# Patient Record
Sex: Male | Born: 1935
Health system: Southern US, Community
[De-identification: ages and names within clinical notes are randomized; demographics above are authoritative.]

## PROBLEM LIST (undated history)

## (undated) DIAGNOSIS — I10 Essential (primary) hypertension: Secondary | ICD-10-CM

## (undated) DIAGNOSIS — N183 Chronic kidney disease, stage 3 unspecified: Secondary | ICD-10-CM

## (undated) DIAGNOSIS — Z950 Presence of cardiac pacemaker: Secondary | ICD-10-CM

## (undated) DIAGNOSIS — R001 Bradycardia, unspecified: Secondary | ICD-10-CM

## (undated) DIAGNOSIS — E785 Hyperlipidemia, unspecified: Secondary | ICD-10-CM

## (undated) DIAGNOSIS — I440 Atrioventricular block, first degree: Secondary | ICD-10-CM

## (undated) DIAGNOSIS — I442 Atrioventricular block, complete: Secondary | ICD-10-CM

## (undated) DIAGNOSIS — N181 Chronic kidney disease, stage 1: Secondary | ICD-10-CM

## (undated) DIAGNOSIS — I4891 Unspecified atrial fibrillation: Secondary | ICD-10-CM

## (undated) DIAGNOSIS — I441 Atrioventricular block, second degree: Secondary | ICD-10-CM

## (undated) DIAGNOSIS — I429 Cardiomyopathy, unspecified: Secondary | ICD-10-CM

## (undated) HISTORY — DX: Chronic kidney disease, stage 1: N18.1

## (undated) HISTORY — DX: Bradycardia, unspecified: R00.1

## (undated) HISTORY — DX: Atrioventricular block, second degree: I44.1

## (undated) HISTORY — PX: COLONOSCOPY: SHX174

## (undated) HISTORY — DX: Essential (primary) hypertension: I10

## (undated) HISTORY — PX: OTHER SURGICAL HISTORY: SHX169

## (undated) HISTORY — DX: Hyperlipidemia, unspecified: E78.5

## (undated) HISTORY — DX: Atrioventricular block, first degree: I44.0

## (undated) HISTORY — PX: HEMORRHOID SURGERY: SHX153

---

## 2002-09-17 ENCOUNTER — Emergency Department (HOSPITAL_COMMUNITY): Admission: EM | Admit: 2002-09-17 | Discharge: 2002-09-17 | Payer: Self-pay | Admitting: Emergency Medicine

## 2002-12-16 ENCOUNTER — Inpatient Hospital Stay (HOSPITAL_COMMUNITY): Admission: EM | Admit: 2002-12-16 | Discharge: 2002-12-19 | Payer: Self-pay | Admitting: Internal Medicine

## 2006-04-25 ENCOUNTER — Emergency Department (HOSPITAL_COMMUNITY): Admission: EM | Admit: 2006-04-25 | Discharge: 2006-04-25 | Payer: Self-pay | Admitting: Emergency Medicine

## 2008-04-11 HISTORY — PX: UMBILICAL HERNIA REPAIR: SHX196

## 2008-04-22 ENCOUNTER — Ambulatory Visit (HOSPITAL_COMMUNITY): Admission: RE | Admit: 2008-04-22 | Discharge: 2008-04-22 | Payer: Self-pay | Admitting: General Surgery

## 2008-07-10 ENCOUNTER — Ambulatory Visit: Payer: Self-pay | Admitting: Cardiology

## 2008-07-12 HISTORY — PX: PACEMAKER INSERTION: SHX728

## 2008-07-12 HISTORY — PX: COLONOSCOPY: SHX174

## 2009-01-07 ENCOUNTER — Encounter: Payer: Self-pay | Admitting: Gastroenterology

## 2009-01-19 ENCOUNTER — Ambulatory Visit: Payer: Self-pay | Admitting: Cardiology

## 2009-01-19 ENCOUNTER — Inpatient Hospital Stay (HOSPITAL_COMMUNITY): Admission: EM | Admit: 2009-01-19 | Discharge: 2009-01-21 | Payer: Self-pay | Admitting: Emergency Medicine

## 2009-01-19 ENCOUNTER — Encounter (INDEPENDENT_AMBULATORY_CARE_PROVIDER_SITE_OTHER): Payer: Self-pay | Admitting: *Deleted

## 2009-01-20 ENCOUNTER — Encounter (INDEPENDENT_AMBULATORY_CARE_PROVIDER_SITE_OTHER): Payer: Self-pay | Admitting: Internal Medicine

## 2009-01-23 ENCOUNTER — Encounter: Payer: Self-pay | Admitting: Gastroenterology

## 2009-01-23 ENCOUNTER — Ambulatory Visit: Payer: Self-pay | Admitting: Internal Medicine

## 2009-01-23 ENCOUNTER — Observation Stay (HOSPITAL_COMMUNITY): Admission: RE | Admit: 2009-01-23 | Discharge: 2009-01-24 | Payer: Self-pay | Admitting: Internal Medicine

## 2009-01-24 ENCOUNTER — Encounter: Payer: Self-pay | Admitting: Internal Medicine

## 2009-02-03 ENCOUNTER — Ambulatory Visit: Payer: Self-pay | Admitting: Gastroenterology

## 2009-02-03 ENCOUNTER — Ambulatory Visit (HOSPITAL_COMMUNITY): Admission: RE | Admit: 2009-02-03 | Discharge: 2009-02-03 | Payer: Self-pay | Admitting: Gastroenterology

## 2009-02-03 ENCOUNTER — Encounter: Payer: Self-pay | Admitting: Gastroenterology

## 2009-02-06 ENCOUNTER — Ambulatory Visit: Payer: Self-pay

## 2009-02-06 ENCOUNTER — Encounter: Payer: Self-pay | Admitting: Internal Medicine

## 2009-02-06 ENCOUNTER — Encounter (INDEPENDENT_AMBULATORY_CARE_PROVIDER_SITE_OTHER): Payer: Self-pay

## 2009-03-10 DIAGNOSIS — E1129 Type 2 diabetes mellitus with other diabetic kidney complication: Secondary | ICD-10-CM | POA: Insufficient documentation

## 2009-03-10 DIAGNOSIS — I10 Essential (primary) hypertension: Secondary | ICD-10-CM | POA: Insufficient documentation

## 2009-03-10 DIAGNOSIS — E785 Hyperlipidemia, unspecified: Secondary | ICD-10-CM | POA: Insufficient documentation

## 2009-05-07 ENCOUNTER — Ambulatory Visit: Payer: Self-pay | Admitting: Internal Medicine

## 2009-06-23 ENCOUNTER — Encounter (INDEPENDENT_AMBULATORY_CARE_PROVIDER_SITE_OTHER): Payer: Self-pay | Admitting: *Deleted

## 2009-06-26 ENCOUNTER — Encounter (INDEPENDENT_AMBULATORY_CARE_PROVIDER_SITE_OTHER): Payer: Self-pay | Admitting: *Deleted

## 2009-06-26 ENCOUNTER — Encounter: Payer: Self-pay | Admitting: Cardiology

## 2009-06-26 LAB — CONVERTED CEMR LAB: TSH: 1.6 microintl units/mL

## 2009-07-03 ENCOUNTER — Encounter (INDEPENDENT_AMBULATORY_CARE_PROVIDER_SITE_OTHER): Payer: Self-pay | Admitting: *Deleted

## 2009-07-29 ENCOUNTER — Encounter (INDEPENDENT_AMBULATORY_CARE_PROVIDER_SITE_OTHER): Payer: Self-pay | Admitting: *Deleted

## 2009-08-01 ENCOUNTER — Ambulatory Visit: Payer: Self-pay | Admitting: Cardiology

## 2009-08-01 DIAGNOSIS — I495 Sick sinus syndrome: Secondary | ICD-10-CM | POA: Insufficient documentation

## 2009-08-01 DIAGNOSIS — Z950 Presence of cardiac pacemaker: Secondary | ICD-10-CM | POA: Insufficient documentation

## 2009-08-04 ENCOUNTER — Encounter: Payer: Self-pay | Admitting: Cardiology

## 2009-08-04 LAB — CONVERTED CEMR LAB
Albumin: 4.3 g/dL (ref 3.5–5.2)
Alkaline Phosphatase: 70 units/L (ref 39–117)
BUN: 25 mg/dL — ABNORMAL HIGH (ref 6–23)
Basophils Absolute: 0 10*3/uL (ref 0.0–0.1)
Calcium: 9.3 mg/dL (ref 8.4–10.5)
Chloride: 107 meq/L (ref 96–112)
Eosinophils Absolute: 0.2 10*3/uL (ref 0.0–0.7)
Eosinophils Relative: 5 % (ref 0–5)
Glucose, Bld: 51 mg/dL — ABNORMAL LOW (ref 70–99)
HCT: 37.5 % — ABNORMAL LOW (ref 39.0–52.0)
HDL: 48 mg/dL (ref >39)
Hemoglobin: 12.1 g/dL — ABNORMAL LOW (ref 13.0–17.0)
Lymphocytes Relative: 37 % (ref 12–46)
MCV: 81.5 fL (ref 78.0–100.0)
Monocytes Absolute: 0.3 10*3/uL (ref 0.1–1.0)
Potassium: 4.7 meq/L (ref 3.5–5.3)
RDW: 15 % (ref 11.5–15.5)
Triglycerides: 167 mg/dL — ABNORMAL HIGH (ref <150)

## 2010-02-11 ENCOUNTER — Ambulatory Visit: Payer: Self-pay | Admitting: Internal Medicine

## 2010-08-11 NOTE — Assessment & Plan Note (Signed)
Summary: 6 mth f/u per checkout on 07/10/08/tg   Visit Type:  Follow-up Primary Provider:  Dr. Legrand Rams   History of Present Illness: Return visit for this very pleasant gentleman with sinus node dysfunction, who unbeknownst to me presented with 2:1 AV block prompting implantation of a dual-chamber pacemaker.  Although his hospital record indicates that he presented with fatigue and dyspnea, but he does not recall ever been symptomatic.  He has remained fine since the pacemaker was implanted.  Current Medications (verified): 1)  Glipizide 5 Mg Tabs (Glipizide) .... One By Mouth Two Times A Day 2)  Aspir-Low 81 Mg Tbec (Aspirin) .... One By Mouth Daily 3)  Losartan Potassium 100 Mg Tabs (Losartan Potassium) .... One By Mouth Daily 4)  Niaspan 500 Mg Cr-Tabs (Niacin (Antihyperlipidemic)) .... One By Mouth Daily 5)  Amlodipine Besylate 5 Mg Tabs (Amlodipine Besylate) .... Take 1 Tab Daily  Allergies (verified): No Known Drug Allergies  Past History:  PMH, FH, and Social History reviewed and updated.  Vital Signs:  Patient profile:   77 year old male Height:      72 inches Weight:      239 pounds BMI:     32.53 Pulse rate:   83 / minute BP sitting:   124 / 71  (right arm)  Vitals Entered By: Doretha Sou, CNA (August 01, 2009 11:17 AM)  Physical Exam  General:  GENERAL:  Very pleasant gentleman in no acute distress. HEENT: Bilateral arcus NECK:  No jugular venous distention; normal carotid upstrokes withoutbruits. LUNGS:  Clear. CARDIAC:  Normal first and second heart sounds; fourth heart sound present; early systolic ejection murmur. ABDOMEN:  Soft and nontender; no bruits; no masses; no organomegaly. EXTREMITIES:  1+ edema; distal pulses-2+ dorsalis pedis on the left; both posterior tibials and the right dorsalis pedis are 0-1+. NEUROLOGIC:  Symmetric strength and tone; normal cranial nerves.  Skin: Multiple skin tags    PPM Specifications Following MD:  Cristopher Peru,  MD     PPM Vendor:  [employer]     PPM Model Number:  [id number]     PPM Serial Number:  [id number] DOI:  01/23/2009     PPM Implanting MD:  Cristopher Peru, MD  Lead 1    Location: [facility]     DOI: 01/23/2009     Model #: [id number]     Serial #: [id number]     Status: active Lead 2    Location: [facility]     DOI: 01/23/2009     Model #: [id number]     Serial #: [id number]     Status: active  Magnet Response Rate:  BOL 100 ERI 85    PPM Follow Up Pacer Dependent:  Yes      Episodes Coumadin:  No  Parameters Mode:  DDD     Lower Rate Limit:  60     Upper Rate Limit:  110 Paced AV Delay:  180     Sensed AV Delay:  150  Impression & Recommendations:  Problem # 1:  SINOATRIAL NODE DYSFUNCTION (ICD-427.81) He is doing fine since his pacemaker was implanted, and is likely to continue to do so for the next 15 years.  He will be followed by Dr. Lovena Le for this problem and is scheduled to return to see him in July.  Problem # 2:  HYPERTENSION (ICD-401.9) Blood pressure control is good with current medications.  Problem # 3:  DIABETES MELLITUS, TYPE II, CONTROLLED (ICD-250.00) Mr. Boyte he  reports CBGs of 100--150.  Although he is on a very low dose of metformin, his creatinine has been above the range where this drug is recommended.  Accordingly, I have asked him to discontinue that medication and discuss further treatment at his next office visit with you.  Certainly would be possible to increase his dose of glipizide if that is required.  Problem # 4:  CHRONIC KIDNEY DISEASE-STAGE III (ICD-585.1)  Repeat chemistry profile has been ordered.  Problem # 5:  HYPERLIPIDEMIA, CONTROLLED (ICD-272.4) Lipid profile has been requested.  With diabetes, LDL should certainly be maintained less than 100 and is close to 70 as is reasonably achievable.  I appreciate Dr. Legrand Rams initial referral of this nice gentleman.  He did not need the services of 2 cardiologist and will be asked followed exclusively by Dr. Lovena Le in the  future.  Other Orders: T-Comprehensive Metabolic Panel ([id number]) T-CBC w/Diff [phone]) T-Lipid Profile [phone])  Patient Instructions: 1)  Your physician recommends that you schedule a follow-up appointment in: prn 2)  Your physician recommends that you return for lab work in: today 3)  Your physician has recommended you make the following change in your medication: stop metformin

## 2010-08-11 NOTE — Procedures (Signed)
Summary: 7/11 f/u per checkout on 05/07/09/tg   Visit Type:  Follow-up Primary Provider:  Dr. Legrand Rams  CC:  no cardiology complaints.  History of Present Illness: Kyle Schultz returns today for followup.  He is a pleasant 77 yo man with a h/o 2:1 heart block and is s/p PPM.  He has HTN, DM, and dyslipidemia.  He has not had any c/p or sob or peripheral edema.  No fever or chills.  Current Medications (verified): 1)  Glipizide 5 Mg Tabs (Glipizide) .... One By Mouth Two Times A Day 2)  Aspir-Low 81 Mg Tbec (Aspirin) .... One By Mouth Daily 3)  Losartan Potassium 100 Mg Tabs (Losartan Potassium) .... One By Mouth Daily 4)  Niaspan 500 Mg Cr-Tabs (Niacin (Antihyperlipidemic)) .... One By Mouth Daily 5)  Amlodipine Besylate 5 Mg Tabs (Amlodipine Besylate) .... Take 1 Tab Daily 6)  Simvastatin 20 Mg Tabs (Simvastatin) .... Take 1 Tablet By Mouth Once Daily  Allergies (verified): No Known Drug Allergies  Past History:  Past Medical History: Last updated: 08/01/2009 Conduction system disease: Sinus bradycardia; first-degree AV block; second degree AV block requiring       implantation of a dual-chamber pacemaker in 01/2009 ([employer]) HYPERLIPIDEMIA HYPERTENSION (ICD-401.9) DIABETES MELLITUS, TYPE II, -A1c of 6.4 in 6/09 CHRONIC KIDNEY DISEASE STAGE I (ICD-585.1)-creatinine of 1.7 in 04/1999  Review of Systems  The patient denies chest pain, syncope, dyspnea on exertion, and peripheral edema.    Vital Signs:  Patient profile:   77 year old male Weight:      236 pounds BMI:     32.12 Pulse rate:   93 / minute BP sitting:   131 / 77  (right arm)  Vitals Entered By: Doretha Sou, CNA (February 11, 2010 8:21 AM)  Physical Exam  General:  GENERAL:  Very pleasant gentleman in no acute distress. HEENT: Bilateral arcus NECK:  No jugular venous distention; normal carotid upstrokes withoutbruits. LUNGS:  Clear. Well healed PPM incision. CARDIAC:  Normal first and second heart sounds;  fourth heart sound present; early systolic ejection murmur. ABDOMEN:  Soft and nontender; no bruits; no masses; no organomegaly. EXTREMITIES:  1+ edema; distal pulses-2+ dorsalis pedis on the left; both posterior tibials and the right dorsalis pedis are 0-1+. NEUROLOGIC:  Symmetric strength and tone; normal cranial nerves.  Skin: Multiple skin tags    PPM Specifications Following MD:  Cristopher Peru, MD     PPM Vendor:  [employer]     PPM Model Number:  [id number]     PPM Serial Number:  [id number] PPM DOI:  01/23/2009     PPM Implanting MD:  Cristopher Peru, MD  Lead 1    Location: [facility]     DOI: 01/23/2009     Model #: [id number]     Serial #: [id number]     Status: active Lead 2    Location: RV     DOI: 01/23/2009     Model #: [id number]     Serial #: [id number]     Status: active  Magnet Response Rate:  BOL 100 ERI 85    PPM Follow Up Remote Check?  No Battery Voltage:  2.98 V     Battery Est. Longevity:  10.7 years     Pacer Dependent:  Yes       PPM Device Measurements Atrium  Amplitude: 1.9 mV, Impedance: 360 ohms, Threshold: 0.5 V at 0.4 msec Right Ventricle  Amplitude: 9.7 mV, Impedance: 490 ohms, Threshold: 0.5  V at 0.4 msec  Episodes MS Episodes:  227     Percent Mode Switch:  <1%     Coumadin:  No Atrial Pacing:  7.6%     Ventricular Pacing:  98%  Parameters Mode:  DDD     Lower Rate Limit:  60     Upper Rate Limit:  110 Paced AV Delay:  180     Sensed AV Delay:  150 Next Cardiology Appt Due:  08/12/2010 Tech Comments:  No parameter changes.  A-fib with controlled ventricular rates.  The longest mode switch was 19:30 minutes, - coumadin.  ROV 6 months [facility] clinic.  No Merlin @ this time. [name], LPN  August  3, [id number] 8:36 AM  MD Comments:  Agree with above.  Impression & Recommendations:  Problem # 1:  CARDIAC PACEMAKER IN SITU (ICD-V45.01) His PPM is working normally.  Will recheck in several months.  Problem # 2:  HYPERLIPIDEMIA, CONTROLLED (ICD-272.4) I have asked him to decrease  his Simvastatin to 20 mg daily as he is on amlodipine for BP control. His updated medication list for this problem includes:    Niaspan 500 Mg Cr-tabs (Niacin (antihyperlipidemic)) ..... One by mouth daily    Simvastatin 20 Mg Tabs (Simvastatin) .Marland Kitchen... Take 1 tablet by mouth once daily  Problem # 3:  HYPERTENSION (ICD-401.9) His blood pressure remains controlled on meds as below.  A low sodium diet is recommended. His updated medication list for this problem includes:    Aspir-low 81 Mg Tbec (Aspirin) ..... One by mouth daily    Losartan Potassium 100 Mg Tabs (Losartan potassium) ..... One by mouth daily    Amlodipine Besylate 5 Mg Tabs (Amlodipine besylate) .Marland Kitchen... Take 1 tab daily  Patient Instructions: 1)  Your physician recommends that you schedule a follow-up appointment in: 6 months for device check and in 1 year with Dr. Lovena Le. 2)  Your physician has recommended you make the following change in your medication: decrease Simvastatin to 20mg  by mouth at bedtime (may take 1/2 tablet of 40mg  tablets until you finish bottle). Prescriptions: SIMVASTATIN 20 MG TABS (SIMVASTATIN) take 1 tablet by mouth once daily  #30 x 6   Entered by:   Jeani Hawking Via LPN   Authorized by:   Sophronia Simas, MD, [facility]   Signed by:   Jeani Hawking Via LPN on [id number]   Method used:   Electronically to        [facility] (retail)       [place] [address]       [place], [place]  [address]       Ph: [phone]       Fax: [phone]   RxID:   [id number]

## 2010-08-11 NOTE — Miscellaneous (Signed)
Summary: simvastatin  Clinical Lists Changes  Medications: Added new medication of SIMVASTATIN 40 MG TABS (SIMVASTATIN) Take one tablet by mouth daily at bedtime - Signed Rx of SIMVASTATIN 40 MG TABS (SIMVASTATIN) Take one tablet by mouth daily at bedtime;  #30 x 6;  Signed;  Entered by: Tye Savoy RN;  Authorized by: Yehuda Savannah, MD, [facility];  Method used: Electronically to [facility] [place], [place], [place], [place]  [address], Ph: [phone], Fax: [phone]    Prescriptions: SIMVASTATIN 40 MG TABS (SIMVASTATIN) Take one tablet by mouth daily at bedtime  #30 x 6   Entered by:   Tye Savoy RN   Authorized by:   Yehuda Savannah, MD, [facility]   Signed by:   Tye Savoy RN on 08/04/2009   Method used:   Electronically to        [facility] (retail)       [place] [address]       [place], [place]  [address]       Ph: [phone]       Fax: [phone]   RxID:   [id number]

## 2010-08-11 NOTE — Miscellaneous (Signed)
Summary: LABS TSH,06/26/2009  Clinical Lists Changes  Observations: Added new observation of TSH: 1.600 microintl units/mL (06/26/2009 10:52)

## 2010-08-14 ENCOUNTER — Encounter: Payer: Self-pay | Admitting: Internal Medicine

## 2010-08-14 ENCOUNTER — Encounter (INDEPENDENT_AMBULATORY_CARE_PROVIDER_SITE_OTHER): Payer: Medicare Other

## 2010-08-14 ENCOUNTER — Ambulatory Visit: Admit: 2010-08-14 | Payer: Self-pay | Admitting: Internal Medicine

## 2010-08-14 DIAGNOSIS — I498 Other specified cardiac arrhythmias: Secondary | ICD-10-CM

## 2010-08-27 NOTE — Procedures (Signed)
Summary: Cardiology Device Clinic   Current Medications (verified): 1)  Glipizide 5 Mg Tabs (Glipizide) .... One By Mouth Two Times A Day 2)  Aspir-Low 81 Mg Tbec (Aspirin) .... One By Mouth Daily 3)  Losartan Potassium 100 Mg Tabs (Losartan Potassium) .... One By Mouth Daily 4)  Niaspan 500 Mg Cr-Tabs (Niacin (Antihyperlipidemic)) .... One By Mouth Daily 5)  Simvastatin 20 Mg Tabs (Simvastatin) .... Take 1 Tablet By Mouth Once Daily  Allergies (verified): No Known Drug Allergies  PPM Specifications Following MD:  Cristopher Peru, MD     PPM Vendor:  [employer]     PPM Model Number:  [id number]     PPM Serial Number:  [id number] PPM DOI:  01/23/2009     PPM Implanting MD:  Cristopher Peru, MD  Lead 1    Location: [facility]     DOI: 01/23/2009     Model #: [id number]     Serial #: [id number]     Status: active Lead 2    Location: [facility]     DOI: 01/23/2009     Model #: [id number]     Serial #: [id number]     Status: active  Magnet Response Rate:  BOL 100 ERI 85  Indications:  2:1 Heart block   PPM Follow Up Remote Check?  No Battery Voltage:  2.98 V     Battery Est. Longevity:  10.4 years     Pacer Dependent:  Yes       PPM Device Measurements Atrium  Amplitude: 2.5 mV, Impedance: 380 ohms, Threshold: 0.375 V at 0.4 msec Right Ventricle  Amplitude: 12 mV, Impedance: 430 ohms, Threshold: 0.375 V at 0.4 msec  Episodes MS Episodes:  260     Percent Mode Switch:  <1%     Coumadin:  No Atrial Pacing:  6.2%     Ventricular Pacing:  99%  Parameters Mode:  DDD     Lower Rate Limit:  60     Upper Rate Limit:  110 Paced AV Delay:  180     Sensed AV Delay:  150 Next Cardiology Appt Due:  02/10/2011 Tech Comments:  No parameter changes.  Device function normal.  Longest mode switch 5:18 minutes, - coumadin, ventricular rates controlled.  ROV 6 months with Dr. Lovena Le in [facility]. [name], LPN  February  3, [id number] 10:58 AM

## 2010-08-27 NOTE — Cardiovascular Report (Signed)
Summary: Office Visit   Office Visit   Imported By: Sallee Provencal 08/20/2010 16:10:44  _____________________________________________________________________  External Attachment:    Type:   Image     Comment:   External Document

## 2010-10-14 ENCOUNTER — Other Ambulatory Visit: Payer: Self-pay | Admitting: Internal Medicine

## 2010-10-18 LAB — GLUCOSE, CAPILLARY
Glucose-Capillary: 81 mg/dL (ref 70–99)
Glucose-Capillary: 113 mg/dL — ABNORMAL HIGH (ref 70–99)
Glucose-Capillary: 113 mg/dL — ABNORMAL HIGH (ref 70–99)
Glucose-Capillary: 117 mg/dL — ABNORMAL HIGH (ref 70–99)
Glucose-Capillary: 62 mg/dL — ABNORMAL LOW (ref 70–99)
Glucose-Capillary: 64 mg/dL — ABNORMAL LOW (ref 70–99)
Glucose-Capillary: 93 mg/dL (ref 70–99)
Glucose-Capillary: 98 mg/dL (ref 70–99)

## 2010-10-18 LAB — COMPREHENSIVE METABOLIC PANEL
Alkaline Phosphatase: 47 U/L (ref 39–117)
BUN: 21 mg/dL (ref 6–23)
Chloride: 105 mEq/L (ref 96–112)
Glucose, Bld: 80 mg/dL (ref 70–99)
Potassium: 4.2 mEq/L (ref 3.5–5.1)
Total Bilirubin: 1.3 mg/dL — ABNORMAL HIGH (ref 0.3–1.2)

## 2010-10-18 LAB — PROTIME-INR
INR: 1 (ref 0.00–1.49)
Prothrombin Time: 13.7 seconds (ref 11.6–15.2)

## 2010-10-18 LAB — BASIC METABOLIC PANEL
Calcium: 9.3 mg/dL (ref 8.4–10.5)
Chloride: 105 mEq/L (ref 96–112)
Creatinine, Ser: 1.21 mg/dL (ref 0.4–1.5)
GFR calc Af Amer: >60 mL/min (ref >60)
Sodium: 138 mEq/L (ref 135–145)

## 2010-10-18 LAB — CARDIAC PANEL(CRET KIN+CKTOT+MB+TROPI)
CK, MB: 4 ng/mL (ref 0.3–4.0)
CK, MB: 4.3 ng/mL — ABNORMAL HIGH (ref 0.3–4.0)
CK, MB: 5.2 ng/mL — ABNORMAL HIGH (ref 0.3–4.0)
Relative Index: 1.1 (ref 0.0–2.5)
Relative Index: 1.3 (ref 0.0–2.5)
Total CK: 450 U/L — ABNORMAL HIGH (ref 7–232)
Troponin I: 0.04 ng/mL (ref 0.00–0.06)

## 2010-10-18 LAB — DIFFERENTIAL
Basophils Absolute: 0 10*3/uL (ref 0.0–0.1)
Basophils Relative: 1 % (ref 0–1)
Eosinophils Absolute: 0.2 10*3/uL (ref 0.0–0.7)
Lymphs Abs: 1.2 10*3/uL (ref 0.7–4.0)
Monocytes Absolute: 0.3 10*3/uL (ref 0.1–1.0)
Monocytes Relative: 6 % (ref 3–12)
Neutro Abs: 1.8 10*3/uL (ref 1.7–7.7)
Neutro Abs: 2.1 10*3/uL (ref 1.7–7.7)
Neutrophils Relative %: 47 % (ref 43–77)
Neutrophils Relative %: 52 % (ref 43–77)

## 2010-10-18 LAB — CBC
HCT: 36.7 % — ABNORMAL LOW (ref 39.0–52.0)
Hemoglobin: 12.4 g/dL — ABNORMAL LOW (ref 13.0–17.0)
MCV: 81.7 fL (ref 78.0–100.0)
Platelets: 163 10*3/uL (ref 150–400)
RBC: 4.36 MIL/uL (ref 4.22–5.81)
WBC: 3.4 10*3/uL — ABNORMAL LOW (ref 4.0–10.5)
WBC: 4.6 10*3/uL (ref 4.0–10.5)

## 2010-10-18 LAB — LIPID PANEL
Cholesterol: 177 mg/dL (ref 0–200)
LDL Cholesterol: 121 mg/dL — ABNORMAL HIGH (ref 0–99)
Total CHOL/HDL Ratio: 4.9 RATIO
Triglycerides: 100 mg/dL (ref <150)

## 2010-10-18 LAB — POCT CARDIAC MARKERS

## 2010-10-19 LAB — GLUCOSE, CAPILLARY
Glucose-Capillary: 105 mg/dL — ABNORMAL HIGH (ref 70–99)
Glucose-Capillary: 82 mg/dL (ref 70–99)

## 2010-11-24 NOTE — Letter (Signed)
July 10, 2008    Tesfaye D. Legrand Rams, [place]  [place], [place] [address]   RE:  RYKAN, VITTETOE  MRN:  [id number]  /  DOB:  03-May-1930   Dear Brandon Melnick,   Mr. Slover was evaluated in the office in consultation today at your  request for bradycardia.  As you know, this nice gentleman has enjoyed  generally excellent health.  He has never been told of any cardiac  problems nor has he been previously evaluated by a cardiologist.  He  does have hyperlipidemia that has been treated medically as well as  hypertension and mild diabetes.  He also has mild chronic kidney  disease, stage III, with a recent creatinine of 1.7.  Recent hemoglobin  A1c was 6.4.  He underwent an uncomplicated surgical repair of an  umbilical hernia in October 2009.  During a recent visit to your office,  an EKG showed sinus bradycardia with marked first-degree AV block,  prompting this assessment.   Mr. Filosa denies any cardiovascular symptoms.  Specifically, he has not  experienced lightheadedness nor syncope.  He has no weakness.  He has no  exertional intolerance, although his lifestyle is fairly sedentary.  He  notes no dyspnea, either at rest or with exercise.   PAST MEDICAL HISTORY:  Otherwise unremarkable.   ALLERGIES:  He has no known allergies.   CURRENT MEDICATIONS:  1. Aspirin 81 mg daily.  2. Niaspan 500 mg daily.  3. Hyzaar 100/25 mg daily.  4. Glipizide 5 mg b.i.d.  5. Metformin 250 mg b.i.d.   SOCIAL HISTORY:  Retired from work with [employer]; widower with  one adult child; no use of tobacco products nor alcohol.   FAMILY HISTORY:  Father had cardiac problems; 14 siblings, three died  due to neoplastic disease, one had ALS and three are still living.  The  cause of death of his other siblings is unknown.   REVIEW OF SYSTEMS:  Notable for need for corrective lenses for near  vision, some hearing impairment, upper and lower dentures, occasional  palpitations, a history of  heart murmur, stable weight and appetite,  urinary frequency, pedal edema and arthritic discomfort in his shoulders  and neck.  All other systems reviewed and are negative.   PHYSICAL EXAMINATION:  GENERAL:  Very pleasant gentleman in no acute  distress.  VITAL SIGNS:  The weight is 235, blood pressure 140/70, heart rate 50  and regular, respirations 14.  NECK:  No jugular venous distention; normal carotid upstrokes without  bruits.  HEENT:  Bilateral arcus; EOMs full; normal lids and conjunctivae; normal  oral mucosa.  LUNGS:  Clear.  CARDIAC:  Normal first and second heart sounds; fourth heart sound  present; early systolic ejection murmur.  ABDOMEN:  Soft and nontender; no bruits; no masses; no organomegaly.  EXTREMITIES:  1+ edema; normal distal pulses.  NEUROLOGIC:  Symmetric strength and tone; normal cranial nerves.   EKG:  Sinus bradycardia; profound first-degree AV block with PR interval  of 360 milliseconds; delayed R-wave progression - cannot exclude  previous anteroseptal myocardial infarction; left atrial abnormality.   Recent laboratory from your office includes an essentially normal CBC  with a minimally reduced hemoglobin and hematocrit, and a normal  chemistry profile except for creatinine of 1.67.  A prior  electrocardiogram was obtained from the hospital record of Mr. Amicucci  surgery.  There is no significant difference between his EKG of 2 months  ago and his current tracing.  IMPRESSION:  Mr. Bonkowski presents with asymptomatic sinus bradycardia and  first-degree atrioventricular block.  A TSH level will be obtained, but  this probably is caused by deterioration of his conduction system.  The  rapidity with which this will progress is uncertain.  The symptoms  of high degree heart block were described to him.  He will call if these  develop.  Otherwise, no current intervention is warranted.   Thanks so much for referring this nice gentleman to me.  I will plan  to  see him again in 6 months.    Sincerely,      Cristopher Estimable. Lattie Haw, MD, [facility]  Electronically Signed    RMR/MedQ  DD: 07/10/2008  DT: 07/11/2008  Job #: [id number]

## 2010-11-24 NOTE — Op Note (Signed)
Kyle Schultz, Kyle Schultz                 ACCOUNT NO.:  [id number]   MEDICAL RECORD NO.:  [id number]          PATIENT TYPE:  AMB   LOCATION:  DAY                           FACILITY:  [facility]   PHYSICIAN:  Chelsea Primus, MD      DATE OF BIRTH:  [birth year]   DATE OF PROCEDURE:  04/22/2008  DATE OF DISCHARGE:                               OPERATIVE REPORT   PREOPERATIVE DIAGNOSIS:  Incarcerated umbilical hernia.   POSTOPERATIVE DIAGNOSIS:  Incarcerated umbilical hernia.   PROCEDURE:  Anterior open umbilical herniorrhaphy with primary repair.   SURGEON:  Chelsea Primus, MD.   ANESTHESIA:  General endotracheal local anesthesia, 0.5% Sensorcaine  plain.   SPECIMEN:  None.   ESTIMATED BLOOD LOSS:  Scant.   INDICATIONS:  The patient is a 77 year old male who presented to my  office as an outpatient with a history of a bulge by his umbilicus.  This is not significantly increased in size, but his surgery caused  increased discomfort in this area.  I was clear that he an umbilical  hernia.  The risks, benefits, and alternatives of a repair were  discussed including but not limited to risk of bleeding, infection, and  recurrence.  Due to its suspected small size and location, I did discuss  both laparoscopic as well as an open approach.  However, again, due to  its size I did recommend that we would proceed with under an open  approach.  The patient's questions and concerns were addressed, and the  patient was consented for the planned procedure.   OPERATION:  The patient was taken to the operating room.  He was placed  in a supine position on the operating table, at which time a general  anesthetic was administered.  Once the patient was asleep, he was  endotracheally intubated by anesthesia.  At this point, his abdomen was  prepped with DuraPrep solution and draped in standard fashion.  An  infraumbilical skin incision was created with a scalpel.  Additional  dissection down through the  subcuticular tissue was carried out using  electrocautery.  This was taken down to the fascia and, then a hemostat  was utilized to bluntly dissect around the umbilical stalk.  At this  point, I utilized the electrocautery dissection to dissect the umbilical  stalk off the underlying hernia sac.  Once freed, the hernia sac was  freed from the fascia and was reduced bluntly back into the abdominal  cavity, noted there did not appear to be any bowel with any incarcerated  segment.  It all appeared to be omental fat, which was easily reduced  back into the abdominal cavity.  The remaining defect was approximately  1 cm in length and easily reapproximated using 0 Ethibond sutures in  simple interrupted fashion with these sutures in place.  The local  anesthetic was instilled.  A 3-0 Vicryl was then utilized to affix the  umbilical stalk to the fascia and a 3-0 Vicryl was utilized to  reapproximate the subcuticular tissue.  A 4-0 Monocryl was utilized to  reapproximate the skin edges in  a running subcuticular suture.  The skin  was washed and dried with a moist and dry towel.  Benzoin was applied  around the incision.  Half-inch Steri-Strips were placed.  The drapes  were  removed.  The patient was allowed to come out of general anesthetic and  was transferred back to regular hospital bed.  He was transferred to  [facility] in stable condition.  At the conclusion of  procedure all instrument, sponge, and needle counts were correct.  The  patient tolerated the procedure well.      Chelsea Primus, MD  Electronically Signed     BZ/MEDQ  D:  04/22/2008  T:  04/23/2008  Job:  [id number]   cc:   Shanna Cisco., MD  [place]  [place] [address]

## 2010-11-24 NOTE — Discharge Summary (Signed)
Kyle Schultz, Kyle Schultz                 ACCOUNT NO.:  [id number]   MEDICAL RECORD NO.:  [id number]          PATIENT TYPE:  INP   LOCATION:  2508                         FACILITY:  [facility]   PHYSICIAN:  Champ Mungo. Lovena Le, MD    DATE OF BIRTH:  [birth year]   DATE OF ADMISSION:  01/23/2009  DATE OF DISCHARGE:  01/24/2009                               DISCHARGE SUMMARY   This patient has no known drug allergies.   TIME FOR THIS DICTATION:  Greater than 40 minutes.   FINAL DIAGNOSES:  1. Discharging day 1, status post implant of a St. Jude ACCENT DR dual-      chamber pacemaker.  2. Symptomatic bradycardia.      a.     2:1 heart block.      b.     Fatigue and presyncope.  3. Echocardiogram this admission, ejection fraction 55-60%.   SECONDARY DIAGNOSES:  1. Diabetes.  2. Hypertension.  3. Dyslipidemia.   PROCEDURE:  January 23, 2009, implant of the [facility] Jude dual-chamber  pacemaker, Kyle Schultz for symptomatic bradycardia.  The device has  been interrogated postprocedure day #1, all values within normal limits.  Chest x-ray shows no pneumothorax, leads in appropriate position.  There  is no hematoma at the incision site.  The patient is asked to keep his  incision dry for the next 7 days to sponge bathe until Thursday, July  22.   BRIEF HISTORY:  Kyle Schultz is a 77 year old male.  He has multiple cardiac  risk factors.  They include hypertension, diabetes, and dyslipidemia.  He has been admitted to [facility] for bradycardia.   Kyle Schultz was first seen at [facility] at the [place] office 6  months ago, he was apparently asymptomatic with sinus bradycardia.  He  had first-degree AV block at that time.  He had no AV modulators on  board.  The patient was symptom free and no further evaluation was  warranted.  The patient was then readmitted to [facility] on  July 12.  He was found to be in 2:1 heart block with dyspnea,  diaphoresis, fatigue, and some shortness of  breath.  The patient is  hemodynamically stable, but he has been referred to [facility]  Electrophysiology for permanent pacemaker implantation.  He presents  electively to [facility] on July 15 for the  procedure.  Electrocardiogram shows 2:1 AV block.  The patient is  relatively unsymptomatic, but we really believe that he will do better  with pacing and that he will find that he is much more able to carry on  his activities of daily life and he should not have recurrent episodes  of dizziness as he states he does sometimes have.   HOSPITAL COURSE:  Elective admission to [facility] on July 15,  he underwent implantation of the dual-chamber St. Jude device the same  day.  His postoperative course has been uncomplicated.  At the time of  discharge, his heart rate is in the 80s.  His admission heart rate was  in the low 40s.  He is A sensing and V pacing.  His device has been  interrogated.  All values within normal limits.   MEDICATIONS AT DISCHARGE:  1. Glipizide 5 mg twice daily.  2. Metformin 500 mg tablets 1/2 tablet twice daily.  3. Enteric-coated aspirin 81 mg daily.  4. Losartan 100 mg daily.  5. Niaspan 500 mg daily at bedtime.  He takes aspirin 1/2 hour prior      to the Niaspan dose.   He follows up at [facility], which is at [facility], on Thursday, July 29 at 10 o'clock and he sees Kyle Schultz in the  [place] office on Wednesday, October 27 at 2:30.  He is asked to keep  his incision dry for the next 7 days and mobility of the left arm has  been discussed with the patient.   LABORATORY VALUES:  Pertinent to this admission were drawn on July 12,  troponin I studies are negative x3.  Protime 13.7.  INR is 1.  Sodium  138, potassium 4.2, chloride 105, carbonate 29, BUN is 16, creatinine  1.21, and glucose 180.  White cells 3.4, hemoglobin 12.3, hematocrit  35.6, and platelets 163.  TSH 1.047.  HGB A1c 6.0.       Kyle Margarita, PA      [place] Lovena Le, MD  Electronically Signed    GM/MEDQ  D:  01/24/2009  T:  01/24/2009  Job:  [id number]   cc:   Tesfaye D. Legrand Rams, MD  Cristopher Estimable. Lattie Haw, MD, [facility]

## 2010-11-24 NOTE — Consult Note (Signed)
NAMELEGRAND, WANNAMAKER                 ACCOUNT NO.:  [id number]   MEDICAL RECORD NO.:  [id number]          PATIENT TYPE:  INP   LOCATION:  IC03                          FACILITY:  [facility]   PHYSICIAN:  Cristopher Estimable. Lattie Haw, MD, FACCDATE OF BIRTH:  30-Mar-1930   DATE OF CONSULTATION:  01/20/2009  DATE OF DISCHARGE:                                 CONSULTATION   PRIMARY CARDIOLOGIST:  Cristopher Estimable. Lattie Haw, MD, [facility]   HISTORY OF PRESENT ILLNESS:  A 77 year old gentleman with multiple  cardiovascular risk factors including hypertension, diabetes, and  hyperlipidemia admitted to hospital for bradycardia after presenting  with palpitations and left arm discomfort.  Mr. Tenzer was first evaluated  in our office approximately 6 months ago, also for asymptomatic sinus  bradycardia and first-degree AV block.  There were no apparent  contributors to his conduction system disease.  In the absence of  symptoms, no further evaluation was deemed to be warranted.  He has done  well since with essentially no symptoms.  He has had no loss of  consciousness.  He fell on one occasion when he slipped on wet grass  while mowing his lawn.  There was no injury at that time.  He thus noted  occasional episodes of dizziness, lasting seconds to minutes that occur  when he is standing, but resolved when he continues to stand or be  active.   The current episode that began when he was awakened from sleep with his  vague left upper arm discomfort and palpitations.  These was described  in the emergency department as tachycardia, it sounds more like  individuals premature beats with his current description, which is  somewhat vague.  The time course of his left arm discomfort is difficult  to ascertain - it seems most likely to have been intermittent and fairly  mild.  There was no apparent exacerbation of symptoms with movement of  the arm or trunk.  He had dyspnea, diaphoresis, nor nausea.  By the  time, he arrived to the  emergency department, essentially all of his  symptoms resolved.  He has had negative cardiac markers except for  transient elevation of CPK with negative MB and negative troponins.  TSH  is normal.  Creatinine is improved from previous value of 1.7-1.4.  CBG  is significantly quite low, all less than 115.  He has minimal anemia  with hemoglobin of 12.4.   PAST MEDICAL HISTORY:  Notable for uncomplicated repair of an umbilical  hernia in October 2009.  He has had stable and now improved kidney  functions that has been mildly impaired.  Hyperlipidemia, hypertension,  and mild diabetes have been well treated medically.   ALLERGIES:  There has been no known allergies.   CURRENT MEDICATIONS:  1. Aspirin 81 mg daily.  2. Niaspan 500 mg daily.  3. Hyzaar 100/25 mg daily.  4. Glipizide 5 mg b.i.d.  5. Metformin 250 mg b.i.d.  This list is based upon his most recent      office visit - the patient could not recall his medications on  admission.   SOCIAL HISTORY:  Retired from work with [employer]; widower who  lives alone; adult child though resides in the area.  No use of tobacco  products or alcohol.   FAMILY HISTORY:  Father had cardiac problems; 14 siblings, 3 of whom  died due to neoplastic disease, 1 with ALS and 3 are still living.   REVIEW OF SYSTEMS:  Notable for impaired vision and he needs corrective  lenses for reading, hearing impairment on the right, upper and lower  dentures, history of heart murmur, stable weight, and appetite on a  diabetic diet.  He has some mild intermittent discomfort in his left  knee that most likely represents arthritis.   PHYSICAL EXAMINATION:  GENERAL:  A very pleasant gentleman in no acute  distress.  VITAL SIGNS:  The temperature is 97.8, weight 105.6 kg, height 72  inches, respirations 13, heart rate 36, and blood pressure 140/55.  HEENT:  Edentulous with normal oral mucosa; anicteric sclerae; bilateral  arcus; pupils equal,  round, and reactive to light.  NECK:  No jugular venous distention; no carotid bruits.  ENDOCRINE:  No thyromegaly.  HEMATOPOIETIC:  No adenopathy.  SKIN:  No significant lesions.  PSYCHIATRIC:  Alert and oriented; normal affect.  LUNGS:  Clear.  CARDIAC:  Normal first and second heart sounds; fourth heart sound  present; modest basilar systolic ejection murmur.  ABDOMEN:  Soft and nontender; normal bowel sounds; no masses; no  organomegaly; aortic pulsation not palpable.  EXTREMITIES:  Trace edema; bounding distal pulses.  MUSCULOSKELETAL:  Full range of motion in all joints; no effusions; no  discomfort in the left upper extremity or left knee at present.   EKG:  Sinus rhythm with a ventricular rate of 33; there appears to be  2:1 AV block and profound first-degree AV block.  R-wave progression is  somewhat delayed; otherwise no specific abnormality.   Chest x-ray shows cardiomegaly, elevation of right hemidiaphragm and  right basilar scarring.   IMPRESSION:  Mr. Regehr returns with asymptomatic bradycardia.  He does  have a rather slow heart rate and second-degree AV block.  Although he  does not strictly speaking meets criteria for pacing, a thickened, I  think unlikely that his conduction system will remain adequately  functional in the future.  I will discuss the need for permanent pacing  with our electrophysiologists.  If they agree that the procedure is  required, it can be done on an outpatient basis with Mr. Zadeh returning  home today to rest and anticipation of the procedure.  An echocardiogram  will be useful to further evaluate his EKG abnormalities and rule out  evidence of previous myocardial infarction.  He will likely require  statin therapy in the setting of diabetes.   I greatly appreciate the request for a consultation and will be happy to  arrange for appropriate management on Mr. Suares conduction system  disease.     Cristopher Estimable. Lattie Haw, MD, [facility]   Electronically Signed    RMR/MEDQ  D:  01/20/2009  T:  01/20/2009  Job:  [id number]

## 2010-11-24 NOTE — H&P (Signed)
Kyle Schultz, REATEGUI                 ACCOUNT NO.:  [id number]   MEDICAL RECORD NO.:  [id number]          PATIENT TYPE:  AMB   LOCATION:  DAY                           FACILITY:  [facility]   PHYSICIAN:  Chelsea Primus, MD      DATE OF BIRTH:  07/15/1929   DATE OF ADMISSION:  DATE OF DISCHARGE:  LH                              HISTORY & PHYSICAL   CHIEF COMPLAINT:  Umbilical hernia.   HISTORY OF PRESENT ILLNESS:  The patient is a 77 year old male who  presented to my office on referral from his primary care physician with  approximately 1-year history of notable bulging by his umbilicus.  He  denies any significant change in the size of this.  He denies any  significant pain, although he states it is worse when wearing a seatbelt  or pass that in this area.  He denied any nausea and vomiting, no fever  or chills.  He denies any significant change with bowel movements.  No  melena.  No hematochezia.  No change with urination.  He denies any skin  changes or signs and symptoms over this area consistent with  incarceration or strangulation.   PAST MEDICAL HISTORY:  1. Diabetes mellitus type 2.  2. Hypertension.   PAST SURGICAL HISTORY:  None.   MEDICATIONS:  1. Niaspan.  2. Hyzaar.  3. Baby aspirin.  4. Metformin.  5. Glipizide.   ALLERGIES:  No known drug allergies.   SOCIAL HISTORY:  No tobacco, no alcohol use.  Occupation, he is retired.   FAMILY HISTORY:  Not applicable.   REVIEW OF SYSTEMS:  CONSTITUTIONAL:  Unremarkable.  EYES:  Unremarkable.  EARS:  Unremarkable.  RESPIRATORY:  Unremarkable.  CARDIOVASCULAR:  Unremarkable.  GASTROINTESTINAL:  As per HPI.  GENITOURINARY:  Unremarkable.  MUSCULOSKELETAL:  Unremarkable.  SKIN:  Unremarkable.  ENDOCRINE:  Unremarkable.  NEURO:  Unremarkable.   PHYSICAL EXAMINATION:  GENERAL:  The patient is obese, calm-appearing  male in no acute distress.  He is alert and oriented x3.  HEENT:  No scalp deformities, no masses.  Eyes, pupils  equal, round and  reactive.  Extraocular movements are intact.  No scleral icterus or  conjunctival pallor is noted.  Ears, he does have diminished hearing on  evaluation today.  Oral mucosa is pink, no occlusion.  NECK:  Trachea is midline.  No cervical lymphadenopathy.  PULMONARY:  Unlabored respiration.  No wheezes.  No crackles.  He is  clear to auscultation bilaterally.  CARDIOVASCULAR:  Regular rate and rhythm.  No murmurs, rubs, or gallops.  EXTREMITIES:  He has 2+ radial pulses and dorsalis pedis pulses  bilaterally.  No peripheral edema is noted.  ABDOMEN:  Positive bowel sounds.  Abdomen is soft, nontender.  He does  have a palpable and reducible umbilical hernia.  This is somewhat tender  on palpation in an attempt to reduce it.  I appreciated inguinal hernias  on either side.  No masses are apparent.  SKIN:  Warm and dry.   ASSESSMENT AND PLAN:  Umbilical hernia.  At this point, I did discuss  the risks, benefits, and alternatives of a umbilical hernia repair with  mesh with the patient.  Due to its small size, I did discuss with the  patient both anterior and laparoscopic approach, but again it is quite  small.  I did at this point discuss with the patient methods to likely  attempting a repair from an anterior approach.  Risks, benefits, and  alternatives were discussed including, but not limited to risk of  bleeding, infection, mesh infection requiring the temporary removal of  the mesh, and interval hernia repair upon resolution of the infection,  possibility of intraoperative cardiac and pulmonary events as well as  possibility of recurrence.  The patient's questions and concerns were  addressed and the patient will be scheduled at the patient's earliest  convenience.  Signs and symptoms of incarceration and strangulation were  also discussed with the patient and the patient was instructed should  any signs or symptoms develop that the patient should present   immediately to the emergency department.      Chelsea Primus, MD  Electronically Signed     BZ/MEDQ  D:  04/11/2008  T:  04/12/2008  Job:  [id number]   cc:   Shanna Cisco., MD  [place]  [place] [address]

## 2010-11-24 NOTE — H&P (Signed)
Kyle Schultz, Kyle Schultz                 ACCOUNT NO.:  [id number]   MEDICAL RECORD NO.:  [id number]          PATIENT TYPE:  INP   LOCATION:  IC03                          FACILITY:  [facility]   PHYSICIAN:  Tesfaye D. Legrand Rams, MD   DATE OF BIRTH:  04-19-1930   DATE OF ADMISSION:  01/19/2009  DATE OF DISCHARGE:  LH                              HISTORY & PHYSICAL   CHIEF COMPLAINT:  Palpitation.   HISTORY OF PRESENT ILLNESS:  This is a 77 year old male patient with a  history of diabetes mellitus, hypertension, and hyperlipidemia, came to  emergency room complaining that his heart rate was rising.  However,  when he was evaluated in the emergency room the patient was found to  have bradycardia.  He was previously also evaluated in Cardiology Clinic  for the same problem.  His bradycardia was at that symptomatic.  He was  advised to continue current treatment and followup.  The patient also  mentioned he had some discomfort on his left shoulder area yesterday.  He is currently feeling better.  No palpitation.  No chest pain.  No  shortness of breath, cough, nausea, vomiting, abdominal pain, dysuria,  urgency, or frequency of urination.   PAST MEDICAL HISTORY:  1. Diabetes mellitus.  2. Hypertension.  3. Hyperlipidemia.  4. Bradycardia.   SOCIAL HISTORY:  The patient is currently retired.  He is a widower.  No  history of alcohol, tobacco, or substance abuse.   PHYSICAL EXAMINATION:  GENERAL:  The patient is alert, awake, and  resting.  VITAL SIGNS:  Blood pressure 142/53, pulse 36-46, respiratory rate 12,  and temperature 97.8 degree Fahrenheit.  HEENT:  Pupils are equal and reactive.  NECK:  Supple.  CHEST:  Decreased air entry, few rhonchi.  CARDIOVASCULAR:  First and second heart sounds heard.  Bradycardic.  ABDOMEN:  Soft and lax.  Bowel sound is positive.  No mass or  organomegaly.  EXTREMITIES:  No leg edema.   LABORATORY DATA:  CBC:  WBC 4.6, hemoglobin 12.1, hematocrit 36.7, and  platelet 172.  CPK-MB 3.2.  Troponin less than 0.05 and myoglobin 326.  Sodium 137, potassium 4.2, chloride 105, carbon dioxide 26, glucose 80,  BUN 22, creatinine 1.39, and calcium 9.3.   ASSESSMENT:  This is a 77 year old male patient with a history of  diabetes mellitus, hypertension, and hyperlipidemia, who was admitted  due to symptomatic bradycardia.  The patient was previously seen in  cardiology clinic.  The patient is currently stable, and he has no chest  pain or shortness of breath.   PLAN:  We will continue the patient on telemetry.  We will do serial  EKGs, cardiac enzymes.  We will do echocardiogram.  We will do  cardiology consult.  We will continue his regular medications.      Tesfaye D. Legrand Rams, MD  Electronically Signed     TDF/MEDQ  D:  01/19/2009  T:  01/19/2009  Job:  [id number]

## 2010-11-24 NOTE — Op Note (Signed)
Kyle Schultz, Kyle Schultz                 ACCOUNT NO.:  [id number]   MEDICAL RECORD NO.:  [id number]          PATIENT TYPE:  INP   LOCATION:  2508                         FACILITY:  [facility]   PHYSICIAN:  Champ Mungo. Lovena Le, MD    DATE OF BIRTH:  Dec 17, 1929   DATE OF PROCEDURE:  01/23/2009  DATE OF DISCHARGE:                               OPERATIVE REPORT   PROCEDURE PERFORMED:  Implantation of a dual chamber pacemaker.   INDICATIONS:  Symptomatic bradycardia.   INTRODUCTION:  The patient is a 77 year old man with a history of  documented 2:1 heart block and fatigue who was admitted to the hospital  for permanent pacemaker insertion.  He was on no AV nodal blocking  drugs.   PROCEDURE IN DETAIL:  After informed consent was obtained, the patient  was taken to the diagnostic EP lab in a fasting state.  After usual  preparation and draping, intravenous fentanyl and midazolam was given  for sedation.  Lidocaine 30 mL was infiltrated in the left  infraclavicular region.  A 5-cm incision was carried out over this  region and electrocautery was utilized to dissect down to the fascial  plane.  The left subclavian vein was then punctured x2 and this was  carried out after several attempts to puncture the vein were  unsuccessful and 10 mL of IV contrast was injected for left upper  extremity venogram demonstrating that the vein was in fact patent.  The  St. Jude model 2088T 58-cm active fixation pacing lead, serial number  [id number] was advanced to the right ventricle and the St. Jude model  2088T 52-cm active fixation pacing lead, serial number [id number] was  advanced to the right atrium.  Mapping was carried out in the right  ventricle at the final site on the RV septum.  The R-waves measured 12  mV and the pacing impedance with the lead actively fixed was 676 ohms,  and threshold 0.8 volts at 0.4 milliseconds.  A large injury current was  noted and 10-volt pacing did not stimulate the diaphragm.   With the  ventricular lead in satisfactory position, attention was then turned to  placement of the atrial lead.  This was placed in the anterolateral wall  of the right atrium where the P-waves measured 3 mV and the pacing  impedance was 480 ohms, and threshold was 0.6 volts at 0.5 milliseconds.  Again, a 10-volt pacing did not stimulate diaphragm and again a large  injury current was noted with active fixation lead.  With both the  atrial and ventricular leads in satisfactory position, they were secured  to the subpectoralis fascia with a figure-of-eight silk suture.  The  sewing sleeve was also secured with silk suture.  Electrocautery was  utilized to make a subcutaneous pocket.  Gentamicin irrigation was  utilized to irrigate the pocket.  The St. Jude Accent DR dual-chamber  pacemaker, serial number [id number] was connected to the leads and placed  back in the subcutaneous pocket.  The pocket was irrigated with  gentamicin and the incision closed with 2-0 and 3-0 Vicryl.  Benzoin  was  painted on the skin, Steri-Strips were applied, a pressure dressing was  placed, and the patient was returned to his room in satisfactory  condition.   COMPLICATIONS:  There were no immediate procedure complications.   RESULTS:  This demonstrates a successful implantation of a St. Jude dual-  chamber pacemaker in a patient with symptomatic 2:1 heart block.      Champ Mungo. Lovena Le, MD  Electronically Signed     GWT/MEDQ  D:  01/23/2009  T:  01/24/2009  Job:  [id number]   cc:   Cristopher Estimable. Lattie Haw, MD, [facility]

## 2010-11-24 NOTE — Op Note (Signed)
NAMEDIAZ, MATUSIK                 ACCOUNT NO.:  [id number]   MEDICAL RECORD NO.:  [id number]          PATIENT TYPE:  AMB   LOCATION:  DAY                           FACILITY:  [facility]   PHYSICIAN:  Caro Hight, M.D.      DATE OF BIRTH:  10/29/1929   DATE OF PROCEDURE:  02/03/2009  DATE OF DISCHARGE:                               OPERATIVE REPORT   REFERRING PHYSICIAN:  Tesfaye D. Fanta, MD   PROCEDURE:  Colonoscopy with snare cautery and cold forceps polypectomy.   INDICATION FOR EXAM:  Mr. Walker is a 77 year old male who presents for  average-risk colon cancer screening.   FINDINGS:  1. Two 4-mm sessile hepatic flexure polyps, removed via cold forceps.      One slightly pedunculated 6- to 8-mm sessile descending colon polyp      removed via snare cautery.  Otherwise, no masses, inflammatory      changes, or AVMs seen.  2. Small internal hemorrhoids.  Otherwise, normal retroflexed view of      the rectum.   RECOMMENDATIONS:  1. He should follow a high-fiber diet.  He is given a handout on a      high-fiber diet.  He is also given handout on polyps and      hemorrhoids.  2. Screening colonoscopy in 10-15 years if the benefits outweigh the      risks.  3. No aspirin or NSAIDs or anticoagulation for 5 days.   MEDICATIONS:  1. Demerol 50 mg IV.  2. Versed 3 mg IV.   PROCEDURE TECHNIQUE:  Physical exam was performed.  Informed consent was  obtained from the patient after explaining the benefits, risks, and  alternatives of the procedure.  The patient was connected to the monitor  and placed in left lateral position.  Continuous oxygen was provided by  nasal cannula and IV medicine administered through an indwelling  cannula.  After administration of sedation and rectal exam, the  patient's rectum was intubated and the scope advanced under direct  visualization to the cecum.  The scope was removed slowly by carefully  examining the color, texture, anatomy, and integrity of the  mucosa on  the way out.  The patient was covered in endoscopy and discharged home  in satisfactory condition.   PATH:  SIMPLE ADENOMAS      Caro Hight, M.D.  Electronically Signed     SM/MEDQ  D:  02/03/2009  T:  02/03/2009  Job:  [id number]   cc:   Tesfaye D. Legrand Rams, MD  Fax: [phone]

## 2010-11-27 NOTE — Discharge Summary (Signed)
   NAME:  Kyle Schultz, Kyle Schultz                           ACCOUNT NO.:  [id number]   MEDICAL RECORD NO.:  [id number]                   PATIENT TYPE:  INP   LOCATION:  A214                                 FACILITY:  [facility]   PHYSICIAN:  Leane Para C. Tamala Julian, M.D.                DATE OF BIRTH:  August 18, 1929   DATE OF ADMISSION:  12/16/2002  DATE OF DISCHARGE:  12/19/2002                                 DISCHARGE SUMMARY   DISCHARGE DIAGNOSES:  1. Nonketotic hyperglycemia.  2. New onset of diabetes mellitus.  3. Hypertension.  4. Hyperlipidemia.   SPECIAL PROCEDURES:  None.   DISPOSITION:  The patient is discharged in stable, satisfactory condition  with blood sugars fairly well controlled.  He was scheduled to have followup  in the office with Dr. Berdine Addison in one week.   DISCHARGE MEDICATIONS:  1. Metformin 500 mg one tab b.i.d.  2. Glipizide 5 mg one tab b.i.d.  3. Hyzaar 100/25 mg q.d.  4. __________ 800 mg q.h.s.  5. Aspirin 325 mg __________.   SUMMARY:  [age]-old male who presents with an one month history  of polyuria, polydipsia, nocturia, and thirst.  He was seen in the emergency  room and was noted to have a blood sugar of 864 with sodium of 124, BUN 35,  creatinine 1.9.  There was no prior history of diabetes, but he had a strong  family history of diabetes.  He was felt to have a newly diagnosed diabetes  mellitus with nonketotic hyperosmolar hyperglycemia treated with medical  treatment.  Other problems include hypertension.  He had no prior  hospitalizations.   PHYSICAL EXAMINATION:  General physical examination was unremarkable.  The  patient was admitted.  He was started on sliding scale insulin coverage.  He  was started on oral agents.  Blood sugar was down to 284 by the end of the  first 24 hours.  His initial hemoglobin A1C was 10.6.  He received diabetic  teaching.  Will follow his blood sugar very closely.  After his blood sugars  were adequately controlled for 24  hours, he was discharged home on oral  medications with plans to follow up in the office by Dr. Berdine Addison.                                               Kyle Schultz. Tamala Julian, M.D.    LCS/MEDQ  D:  02/17/2003  T:  02/17/2003  Job:  [id number]

## 2010-11-27 NOTE — H&P (Signed)
NAME:  Kyle Schultz, Kyle Schultz                           ACCOUNT NO.:  [id number]   MEDICAL RECORD NO.:  [id number]                   PATIENT TYPE:  INP   LOCATION:  A214                                 FACILITY:  [facility]   PHYSICIAN:  Leane Para C. Tamala Julian, M.D.                DATE OF BIRTH:  May 20, 1930   DATE OF ADMISSION:  12/16/2002  DATE OF DISCHARGE:                                HISTORY & PHYSICAL   HISTORY AND PHYSICAL:  [age]-year-old male who presents with an one  month history of polyuria, polydipsia, with increase in nocturia and  worsening thirst.  He denies blurred vision.  The patient was seen in the  emergency room and was noted to have a blood sugar of 864 with sodium of  124, BUN 35, and creatinine 1.9.  There is no prior history of diabetes,  though there is a strong family history of diabetes.  The patient has new  onset of diabetes and is admitted for treatment.   PAST HISTORY:  He has a history of hypertension and hyperlipidemia.  He has  no other major illnesses.  He has had no prior hospitalizations and no  surgery.   MEDICATIONS:  1. Hyzaar 100/25 q.d.  2. Lescol 80 mg q.h.s.  3. Aspirin 81 mg q.d.   SOCIAL HISTORY:  He does not drink, smoke, or use drugs.  He is divorced.  He has one child.  He is retired from the [employer] since  1992.   EXAMINATION:  He is a pleasant male who is in no acute distress.  Blood  pressure is 150/80.  Pulse 100.  Respirations 20.  Temperature 97 degrees.  Oxygen saturation is 96%.  HEENT:  Unremarkable except for poor dentition.  NECK:  Supple.  No JVD or bruit.  CHEST:  Clear to auscultation.  No rales, rubs, rhonchi, or wheezes.  HEART:  Regular rate and rhythm without murmur, gallop, or rub.  ABDOMEN:  Obese, soft, and nontender.  No masses.  Normal active bowel  sounds.  No bruit.  EXTREMITIES:  No cyanosis, clubbing, or edema.  No nail changes of  onychomycosis.  No joint deformity.  NEUROLOGIC:  Nonfocal.  No  motor, sensory, or cerebellar deficit.   IMPRESSION:  1. New onset of diabetes with nonketotic hyperosmolar hyperglycemia.  2. Mild prerenal azotemia due to new onset of diabetes with nonketotic     hyperosmolar hyperglycemia.  3. History of hypertension.  4. Hyperlipidemia.    PLAN:  The patient will be admitted and started on insulin drip.  After  blood sugar is controlled, he will be placed on sliding scale and switched  to oral medications.  If oral medications are not effective, will then add  supplemental insulin.  [name]. Tamala Julian, M.D.    LCS/MEDQ  D:  12/16/2002  T:  12/16/2002  Job:  [id number]

## 2010-11-27 NOTE — Discharge Summary (Signed)
NAMEELMORE, STOPHEL                 ACCOUNT NO.:  [id number]   MEDICAL RECORD NO.:  [id number]          PATIENT TYPE:  INP   LOCATION:  IC03                          FACILITY:  [facility]   PHYSICIAN:  Tesfaye D. Legrand Rams, MD   DATE OF BIRTH:  03-10-1930   DATE OF ADMISSION:  01/19/2009  DATE OF DISCHARGE:  07/13/2010LH                               DISCHARGE SUMMARY   DISCHARGE DIAGNOSES:  1. Bradyarrhythmia.  2. Diabetes mellitus.  3. Hypertension - hyperlipidemia.   DISCHARGE MEDICATIONS:  1. Aspirin 81 mg daily.  2. Niaspan 500 mg daily.  3. Hyzaar 100/25 one tablet p.o. daily.  4. Glipizide 5 mg b.i.d.  5. Metformin 250 mg b.i.d.   DISPOSITION:  The patient was discharged back home in stable condition.   HOSPITAL COURSE:  This is a 77 year old male patient with history of  multiple medical illnesses who was admitted due to palpitation.  However, when he was evaluated in emergency room, the patient was found  to have significant bradycardia.  The patient also had symptoms of chest  pain.  He was admitted and had serial EKG, cardiac enzymes.  The patient  was evaluated by Cardiology.  Over the hospital stay, the patient  improved and his medications were adjusted.  He was discharged home in  stable condition to be followed in outpatient.      Tesfaye D. Legrand Rams, MD  Electronically Signed     TDF/MEDQ  D:  02/11/2009  T:  02/11/2009  Job:  [id number]

## 2011-02-17 ENCOUNTER — Encounter: Payer: Self-pay | Admitting: Internal Medicine

## 2011-02-17 ENCOUNTER — Ambulatory Visit (INDEPENDENT_AMBULATORY_CARE_PROVIDER_SITE_OTHER): Payer: Medicare Other | Admitting: Internal Medicine

## 2011-02-17 DIAGNOSIS — I495 Sick sinus syndrome: Secondary | ICD-10-CM

## 2011-02-17 DIAGNOSIS — E785 Hyperlipidemia, unspecified: Secondary | ICD-10-CM

## 2011-02-17 DIAGNOSIS — Z950 Presence of cardiac pacemaker: Secondary | ICD-10-CM

## 2011-02-17 DIAGNOSIS — I10 Essential (primary) hypertension: Secondary | ICD-10-CM

## 2011-02-17 NOTE — Assessment & Plan Note (Signed)
He will continue his current medical therapy. I've asked that he maintain a low-fat diet. Regular exercise is recommended.

## 2011-02-17 NOTE — Patient Instructions (Signed)
Your physician recommends that you schedule a follow-up appointment in:6 months with Nevin Bloodgood.  Your physician recommends that you schedule a follow-up appointment in: 12 months with Dr. Lovena Le.  Your physician recommends that you continue on your current medications as directed. Please refer to the Current Medication list given to you today.

## 2011-02-17 NOTE — Progress Notes (Signed)
HPI [name] returns today for followup. He is a pleasant 77 year old man with a history of complete heart block status post permanent pacemaker insertion. He has a history of hypertension and dyslipidemia. He is hard of hearing. The patient denies chest pain or shortness of breath. He remains active and pushes his lawnmower to cut his grass. The patient does note mild peripheral edema. When questioned, he admits to sodium indiscretion. He denies syncope or palpitations. No Known Allergies   Current Outpatient Prescriptions  Medication Sig Dispense Refill  . glipiZIDE (GLUCOTROL) 5 MG tablet Take 5 mg by mouth daily.       Marland Kitchen losartan (COZAAR) 100 MG tablet Take 100 mg by mouth daily.       Marland Kitchen NIASPAN 500 MG CR tablet Take 500 mg by mouth at bedtime.       Marland Kitchen ZOCOR 20 MG tablet TAKE (1) TABLET BY MOUTH AT BEDTIME.  90 each  3     No past medical history on file.  ROS:   All systems reviewed and negative except as noted in the HPI.   No past surgical history on file.   No family history on file.   History   Social History  . Marital Status: Widowed    Spouse Name: N/A    Number of Children: N/A  . Years of Education: N/A   Occupational History  . Not on file.   Social History Main Topics  . Smoking status: Never Smoker   . Smokeless tobacco: Never Used  . Alcohol Use: No  . Drug Use: No  . Sexually Active: Not on file   Other Topics Concern  . Not on file   Social History Narrative  . No narrative on file     BP 159/87  Pulse 90  Ht 6' (1.829 m)  Wt 232 lb (105.235 kg)  BMI 31.47 kg/m2  Physical Exam:  Well appearing NAD HEENT: Unremarkable Neck:  No JVD, no thyromegally Lymphatics:  No adenopathy Back:  No CVA tenderness Lungs:  Clear. Well-healed pacemaker incision. HEART:  Regular rate rhythm, no murmurs, no rubs, no clicks Abd:  soft, positive bowel sounds, no organomegally, no rebound, no guarding Ext:  2 plus pulses, 2+ edema, no cyanosis, no  clubbing Skin:  No rashes no nodules Neuro:  CN II through XII intact, motor grossly intact  DEVICE  Normal device function.  See PaceArt for details.   Assess/Plan:

## 2011-02-17 NOTE — Assessment & Plan Note (Signed)
His blood pressure is elevated today. He admits to noncompliance with medications as well as sodium indiscretion. I have encouraged him on both counts.

## 2011-02-17 NOTE — Assessment & Plan Note (Signed)
His device is working normally. Will plan to recheck in several months.

## 2011-04-12 LAB — GLUCOSE, CAPILLARY
Glucose-Capillary: 101 — ABNORMAL HIGH
Glucose-Capillary: 108 — ABNORMAL HIGH

## 2011-04-12 LAB — BASIC METABOLIC PANEL
BUN: 23
CO2: 26
Calcium: 9.5
Creatinine, Ser: 1.67 — ABNORMAL HIGH
Glucose, Bld: 104 — ABNORMAL HIGH

## 2011-04-12 LAB — CBC
Hemoglobin: 12.5 — ABNORMAL LOW
MCHC: 33.6
Platelets: 206
RDW: 14.6

## 2011-07-07 ENCOUNTER — Encounter: Payer: Self-pay | Admitting: Internal Medicine

## 2011-08-18 ENCOUNTER — Other Ambulatory Visit: Payer: Self-pay | Admitting: *Deleted

## 2011-08-18 MED ORDER — SIMVASTATIN 20 MG PO TABS: 20.0000 mg | ORAL_TABLET | Freq: Every day | ORAL | Status: DC

## 2011-08-27 ENCOUNTER — Encounter: Payer: Self-pay | Admitting: Internal Medicine

## 2011-08-27 ENCOUNTER — Ambulatory Visit (INDEPENDENT_AMBULATORY_CARE_PROVIDER_SITE_OTHER): Payer: Medicare Other | Admitting: *Deleted

## 2011-08-27 DIAGNOSIS — I495 Sick sinus syndrome: Secondary | ICD-10-CM | POA: Diagnosis not present

## 2011-08-27 LAB — PACEMAKER DEVICE OBSERVATION
AL AMPLITUDE: 2.1 mv
BAMS-0001: 150 {beats}/min
BATTERY VOLTAGE: 2.9328 V
VENTRICULAR PACING PM: 100

## 2011-09-07 DIAGNOSIS — E1149 Type 2 diabetes mellitus with other diabetic neurological complication: Secondary | ICD-10-CM | POA: Diagnosis not present

## 2011-09-07 DIAGNOSIS — E119 Type 2 diabetes mellitus without complications: Secondary | ICD-10-CM | POA: Diagnosis not present

## 2011-09-13 DIAGNOSIS — E119 Type 2 diabetes mellitus without complications: Secondary | ICD-10-CM | POA: Diagnosis not present

## 2011-09-13 DIAGNOSIS — E7800 Pure hypercholesterolemia, unspecified: Secondary | ICD-10-CM | POA: Diagnosis not present

## 2011-09-13 DIAGNOSIS — I10 Essential (primary) hypertension: Secondary | ICD-10-CM | POA: Diagnosis not present

## 2011-09-27 ENCOUNTER — Encounter: Payer: Self-pay | Admitting: Internal Medicine

## 2011-11-16 DIAGNOSIS — E119 Type 2 diabetes mellitus without complications: Secondary | ICD-10-CM | POA: Diagnosis not present

## 2011-11-16 DIAGNOSIS — E1149 Type 2 diabetes mellitus with other diabetic neurological complication: Secondary | ICD-10-CM | POA: Diagnosis not present

## 2011-12-16 DIAGNOSIS — I10 Essential (primary) hypertension: Secondary | ICD-10-CM | POA: Diagnosis not present

## 2011-12-16 DIAGNOSIS — E7800 Pure hypercholesterolemia, unspecified: Secondary | ICD-10-CM | POA: Diagnosis not present

## 2011-12-16 DIAGNOSIS — E119 Type 2 diabetes mellitus without complications: Secondary | ICD-10-CM | POA: Diagnosis not present

## 2012-01-25 DIAGNOSIS — E1149 Type 2 diabetes mellitus with other diabetic neurological complication: Secondary | ICD-10-CM | POA: Diagnosis not present

## 2012-01-25 DIAGNOSIS — E119 Type 2 diabetes mellitus without complications: Secondary | ICD-10-CM | POA: Diagnosis not present

## 2012-02-21 ENCOUNTER — Emergency Department (HOSPITAL_COMMUNITY)
Admission: EM | Admit: 2012-02-21 | Discharge: 2012-02-21 | Disposition: A | Discharge status: Home / Self Care | Payer: Medicare Other | Attending: Emergency Medicine | Admitting: Emergency Medicine

## 2012-02-21 ENCOUNTER — Encounter (HOSPITAL_COMMUNITY): Payer: Self-pay | Admitting: *Deleted

## 2012-02-21 ENCOUNTER — Emergency Department (HOSPITAL_COMMUNITY): Payer: Medicare Other

## 2012-02-21 DIAGNOSIS — E119 Type 2 diabetes mellitus without complications: Secondary | ICD-10-CM | POA: Insufficient documentation

## 2012-02-21 DIAGNOSIS — R002 Palpitations: Secondary | ICD-10-CM | POA: Diagnosis not present

## 2012-02-21 DIAGNOSIS — I129 Hypertensive chronic kidney disease with stage 1 through stage 4 chronic kidney disease, or unspecified chronic kidney disease: Secondary | ICD-10-CM | POA: Insufficient documentation

## 2012-02-21 DIAGNOSIS — E785 Hyperlipidemia, unspecified: Secondary | ICD-10-CM | POA: Insufficient documentation

## 2012-02-21 DIAGNOSIS — Z7982 Long term (current) use of aspirin: Secondary | ICD-10-CM | POA: Insufficient documentation

## 2012-02-21 DIAGNOSIS — N181 Chronic kidney disease, stage 1: Secondary | ICD-10-CM | POA: Diagnosis not present

## 2012-02-21 DIAGNOSIS — I4902 Ventricular flutter: Secondary | ICD-10-CM | POA: Diagnosis not present

## 2012-02-21 LAB — CBC WITH DIFFERENTIAL/PLATELET
Basophils Absolute: 0 10*3/uL (ref 0.0–0.1)
HCT: 36.7 % — ABNORMAL LOW (ref 39.0–52.0)
Hemoglobin: 12.1 g/dL — ABNORMAL LOW (ref 13.0–17.0)
Lymphocytes Relative: 40 % (ref 12–46)
Lymphs Abs: 1.6 10*3/uL (ref 0.7–4.0)
MCV: 79.4 fL (ref 78.0–100.0)
Monocytes Absolute: 0.2 10*3/uL (ref 0.1–1.0)
Monocytes Relative: 5 % (ref 3–12)
Neutro Abs: 1.9 10*3/uL (ref 1.7–7.7)
RBC: 4.62 MIL/uL (ref 4.22–5.81)
RDW: 14.4 % (ref 11.5–15.5)
WBC: 4 10*3/uL (ref 4.0–10.5)

## 2012-02-21 LAB — BASIC METABOLIC PANEL
CO2: 23 mEq/L (ref 19–32)
Chloride: 105 mEq/L (ref 96–112)
Creatinine, Ser: 1.2 mg/dL (ref 0.50–1.35)
Glucose, Bld: 137 mg/dL — ABNORMAL HIGH (ref 70–99)

## 2012-02-21 LAB — TROPONIN I: Troponin I: <0.3 ng/mL (ref <0.30)

## 2012-02-21 MED ORDER — SIMETHICONE 80 MG PO CHEW
80.0000 mg | CHEWABLE_TABLET | Freq: Once | ORAL | Status: AC
  Administered 2012-02-21: 80 mg via ORAL
  Filled 2012-02-21: qty 1

## 2012-02-21 NOTE — ED Notes (Signed)
Pt told edp he wanted something for gas.

## 2012-02-21 NOTE — ED Notes (Signed)
Heart "fluttering " pta,  Lasted "few minutes", no chest pain, alert, talking, NAD.

## 2012-02-21 NOTE — ED Notes (Signed)
Pt reports his heart fluttering at times, has history of afib. Pt placed on all monitors and ekg obtained

## 2012-02-21 NOTE — ED Provider Notes (Signed)
History   This chart was scribed for Kyle Muskrat, MD by Marin Comment . The patient was seen in room APA10/APA10. Patient's care was started at 1307.   CSN: [id number]  Arrival date & time 02/21/12  1200   First MD Initiated Contact with Patient 02/21/12 1307      Chief Complaint  Patient presents with  . Irregular Heart Beat     HPI Kyle Schultz is a 78 y.o. male who presents to the Emergency Department complaining of moderate irregular heart beat for the past week intermittently. Pt reports his heart has been "fluttering", lasting a few minutes. Pt reports a h/o similar symptoms a few weeks ago, where he felt like he "might pass out". Pt denies any confusion, disorientation, difficulty breathing, chest pain, abdominal pain and n/v. Pt reports some associated leg swelling but none currently. Pt reports that he has a pacemaker in place. Pt reports that he is currently asymptomatic.   Cardiologist: Dr. Lovena Le PCP: Dr. Legrand Rams  Past Medical History  Diagnosis Date  . Sinus bradycardia   . First degree AV block   . Second degree AV block   . Hyperlipidemia   . Hypertension   . Diabetes mellitus   . CKD (chronic kidney disease), stage I     Past Surgical History  Procedure Date  . Umbilical hernia repair 0000000  . Colonscopy     Family History  Problem Relation Age of Onset  . Heart disease Father   . Cancer Sister   . Cancer Sister   . Cancer Sister   . ALS Sister     History  Substance Use Topics  . Smoking status: Never Smoker   . Smokeless tobacco: Never Used  . Alcohol Use: No      Review of Systems  Constitutional:       Per HPI, otherwise negative  HENT:       Per HPI, otherwise negative  Eyes: Negative.   Respiratory:       Per HPI, otherwise negative  Cardiovascular:       Per HPI, otherwise negative  Gastrointestinal: Negative for vomiting.  Genitourinary: Negative.   Musculoskeletal:       Per HPI, otherwise negative  Skin:  Negative.   Neurological: Negative for syncope.  Hematological: Negative.   All other systems reviewed and are negative.    Allergies  Review of patient's allergies indicates no known allergies.  Home Medications   Current Outpatient Rx  Name Route Sig Dispense Refill  . ASPIRIN 81 MG PO TABS Oral Take 81 mg by mouth daily.      Marland Kitchen GLIPIZIDE 5 MG PO TABS Oral Take 5 mg by mouth 2 (two) times daily before a meal.     . LINAGLIPTIN 5 MG PO TABS Oral Take 5 mg by mouth daily.    Marland Kitchen LOSARTAN POTASSIUM 100 MG PO TABS Oral Take 100 mg by mouth daily.     Marland Kitchen NIASPAN 500 MG PO TBCR Oral Take 500 mg by mouth at bedtime.     Marland Kitchen SIMVASTATIN 20 MG PO TABS Oral Take 1 tablet (20 mg total) by mouth daily. 30 tablet 6    BP 182/88  Pulse 97  Temp 98 F (36.7 C) (Oral)  Resp 20  Ht 6' (1.829 m)  Wt 125 lb (56.7 kg)  BMI 16.95 kg/m2  SpO2 100%  Physical Exam  Nursing note and vitals reviewed. Constitutional: He is oriented to person, place, and time. He  appears well-developed. No distress.  HENT:  Head: Normocephalic and atraumatic.  Eyes: Conjunctivae and EOM are normal.  Cardiovascular: Normal rate, regular rhythm and normal heart sounds.   Pulmonary/Chest: Effort normal and breath sounds normal. No stridor. No respiratory distress. He has no wheezes.  Abdominal: Soft. Bowel sounds are normal. He exhibits no distension. There is no tenderness.  Musculoskeletal: He exhibits no edema.  Neurological: He is alert and oriented to person, place, and time.  Skin: Skin is warm and dry.  Psychiatric: He has a normal mood and affect.    ED Course  Procedures (including critical care time)  DIAGNOSTIC STUDIES: Oxygen Saturation is 100% on room air, normal by my interpretation.   Cardiac: 82, paced, abnormal   Date: 02/21/2012  Rate: 102  Rhythm: sinus tachycardia  QRS Axis: left  Intervals: QT prolonged  ST/T Wave abnormalities: nonspecific T wave changes  Conduction  Disutrbances:nonspecific intraventricular conduction delay  Narrative Interpretation:   Old EKG Reviewed: changes noted ABNORMAL  COORDINATION OF CARE:   13:45-Discussed planned course of treatment with the patient, who is agreeable at this time.     Labs Reviewed  CBC WITH DIFFERENTIAL  BASIC METABOLIC PANEL   No results found.   No diagnosis found.   5:21 PM Patient seems comfortable.  He was informed of all results.  Immediately after the patient's arrival we paged to have a representative of his pacemaker manufacturer, [employer] this device.  I just discussed these results with the representative.  She notes that there was an episode of atrial fibrillation several days ago, but that the pacemaker is working appropriately  MDM  I personally performed the services described in this documentation, which was scribed in my presence. The recorded information has been reviewed and considered.  This pleasant elderly male presents with palpitations, and a sense of generally being uncomfortable there has been intermittent over the past days.  On arrival, notably, the patient is asymptomatic, states that he is comfortable. The patient has evidence of mild abdominal pain while in the emergency department, though this resolves with simethicone.  The patient's pacemaker was interrogated, and found to be working appropriately. The patient remained in no distress with no new changes throughout the emergency department stay, was discharged in stable condition with low suspicion for acute ongoing ischemia or infectious pathology.      Kyle Muskrat, MD 02/21/12 1723

## 2012-02-21 NOTE — ED Notes (Signed)
Pt in xray

## 2012-02-21 NOTE — ED Notes (Signed)
Pt unsure of any information about pacemaker,  rn contracted [employer]  [id number]. Model # PN [id number] Serial # [id number], will page out service rep.  Aaron Edelman smalls whom is service rep for [employer].

## 2012-02-22 ENCOUNTER — Telehealth: Payer: Self-pay | Admitting: Internal Medicine

## 2012-02-22 NOTE — Telephone Encounter (Signed)
Spoke with [employer]   Teleminder call for Tome Palley for 02/25/12 @9 :45 am appointment with Dr. Lovena Le in [facility] for a pacemaker check.

## 2012-02-22 NOTE — Telephone Encounter (Signed)
New msg Pt said he was told to call today about his pacemaker

## 2012-02-25 ENCOUNTER — Encounter: Payer: Self-pay | Admitting: Internal Medicine

## 2012-02-25 ENCOUNTER — Ambulatory Visit (INDEPENDENT_AMBULATORY_CARE_PROVIDER_SITE_OTHER): Payer: Medicare Other | Admitting: Internal Medicine

## 2012-02-25 VITALS — BP 139/80 | HR 74 | Ht 72.0 in | Wt 227.0 lb

## 2012-02-25 DIAGNOSIS — E785 Hyperlipidemia, unspecified: Secondary | ICD-10-CM

## 2012-02-25 DIAGNOSIS — I495 Sick sinus syndrome: Secondary | ICD-10-CM | POA: Diagnosis not present

## 2012-02-25 DIAGNOSIS — Z950 Presence of cardiac pacemaker: Secondary | ICD-10-CM | POA: Diagnosis not present

## 2012-02-25 DIAGNOSIS — I10 Essential (primary) hypertension: Secondary | ICD-10-CM | POA: Diagnosis not present

## 2012-02-25 LAB — PACEMAKER DEVICE OBSERVATION
AL AMPLITUDE: 2.4 mv
AL THRESHOLD: 0.375 V
BAMS-0001: 150 {beats}/min
RV LEAD AMPLITUDE: 12 mv
RV LEAD IMPEDENCE PM: 437.5 Ohm
RV LEAD THRESHOLD: 0.5 V

## 2012-02-25 NOTE — Assessment & Plan Note (Signed)
His pacemaker is working normally. We'll plan to recheck in several months.

## 2012-02-25 NOTE — Assessment & Plan Note (Signed)
His blood pressure is fairly well controlled. I've asked the patient to continue his current medical therapy and maintain a low-sodium diet.

## 2012-02-25 NOTE — Patient Instructions (Signed)
Your physician wants you to follow-up in: 6 months with Nevin Bloodgood in the device clinic.  You will receive a reminder letter in the mail two months in advance. If you don't receive a letter, please call our office to schedule the follow-up appointment.  Your physician wants you to follow-up in: 1 year with Dr. Lovena Le.  You will receive a reminder letter in the mail two months in advance. If you don't receive a letter, please call our office to schedule the follow-up appointment.

## 2012-02-25 NOTE — Assessment & Plan Note (Signed)
He is encouraged to reduce his fat intake and continue his current medical therapy. I've asked him to increase his exercise.

## 2012-02-25 NOTE — Progress Notes (Signed)
HPI [name] returns today for followup. He is a very pleasant 78 year old man with a history of symptomatic bradycardia, status post permanent pacemaker insertion secondary to complete heart block. He has hypertension and dyslipidemia. He has diabetes. In the interim, he has done well. His only complaint is mild dyspnea with exertion. No chest pain. Minimal peripheral edema. No syncope. No Known Allergies   Current Outpatient Prescriptions  Medication Sig Dispense Refill  . aspirin 81 MG tablet Take 81 mg by mouth daily.        Marland Kitchen glipiZIDE (GLUCOTROL) 5 MG tablet Take 5 mg by mouth 2 (two) times daily before a meal.       . linagliptin (TRADJENTA) 5 MG TABS tablet Take 5 mg by mouth daily.      Marland Kitchen losartan (COZAAR) 100 MG tablet Take 100 mg by mouth daily.       Marland Kitchen NIASPAN 500 MG CR tablet Take 500 mg by mouth at bedtime.       . simvastatin (ZOCOR) 20 MG tablet Take 1 tablet (20 mg total) by mouth daily.  30 tablet  6     Past Medical History  Diagnosis Date  . Sinus bradycardia   . First degree AV block   . Second degree AV block   . Hyperlipidemia   . Hypertension   . Diabetes mellitus   . CKD (chronic kidney disease), stage I     ROS:   All systems reviewed and negative except as noted in the HPI.   Past Surgical History  Procedure Date  . Umbilical hernia repair 0000000  . Colonscopy      Family History  Problem Relation Age of Onset  . Heart disease Father   . Cancer Sister   . Cancer Sister   . Cancer Sister   . ALS Sister      History   Social History  . Marital Status: Widowed    Spouse Name: N/A    Number of Children: N/A  . Years of Education: N/A   Occupational History  . Retired, Optometrist [employer]    Social History Main Topics  . Smoking status: Never Smoker   . Smokeless tobacco: Never Used  . Alcohol Use: No  . Drug Use: No  . Sexually Active: Not on file   Other Topics Concern  . Not on file   Social History Narrative   Widowed1  childWalks daily     BP 139/80  Pulse 74  Ht 6' (1.829 m)  Wt 227 lb (102.967 kg)  BMI 30.79 kg/m2  Physical Exam:  Well appearing elderly man, NAD HEENT: Unremarkable Neck:  No JVD, no thyromegally Lungs:  Clear with no wheezes, rales, or rhonchi. HEART:  Regular rate rhythm, no murmurs, no rubs, no clicks Abd:  soft, positive bowel sounds, no organomegally, no rebound, no guarding Ext:  2 plus pulses, no edema, no cyanosis, no clubbing Skin:  No rashes no nodules Neuro:  CN II through XII intact, motor grossly intact  DEVICE  Normal device function.  See PaceArt for details.   Assess/Plan:

## 2012-02-29 ENCOUNTER — Encounter: Payer: Self-pay | Admitting: Internal Medicine

## 2012-03-19 ENCOUNTER — Other Ambulatory Visit: Payer: Self-pay | Admitting: Internal Medicine

## 2012-03-23 DIAGNOSIS — I10 Essential (primary) hypertension: Secondary | ICD-10-CM | POA: Diagnosis not present

## 2012-03-23 DIAGNOSIS — Z23 Encounter for immunization: Secondary | ICD-10-CM | POA: Diagnosis not present

## 2012-03-23 DIAGNOSIS — E119 Type 2 diabetes mellitus without complications: Secondary | ICD-10-CM | POA: Diagnosis not present

## 2012-04-04 DIAGNOSIS — E1149 Type 2 diabetes mellitus with other diabetic neurological complication: Secondary | ICD-10-CM | POA: Diagnosis not present

## 2012-04-04 DIAGNOSIS — E119 Type 2 diabetes mellitus without complications: Secondary | ICD-10-CM | POA: Diagnosis not present

## 2012-04-28 ENCOUNTER — Other Ambulatory Visit: Payer: Self-pay | Admitting: Internal Medicine

## 2012-06-13 DIAGNOSIS — E119 Type 2 diabetes mellitus without complications: Secondary | ICD-10-CM | POA: Diagnosis not present

## 2012-06-13 DIAGNOSIS — E1149 Type 2 diabetes mellitus with other diabetic neurological complication: Secondary | ICD-10-CM | POA: Diagnosis not present

## 2012-06-22 DIAGNOSIS — E7800 Pure hypercholesterolemia, unspecified: Secondary | ICD-10-CM | POA: Diagnosis not present

## 2012-06-22 DIAGNOSIS — E119 Type 2 diabetes mellitus without complications: Secondary | ICD-10-CM | POA: Diagnosis not present

## 2012-06-22 DIAGNOSIS — I10 Essential (primary) hypertension: Secondary | ICD-10-CM | POA: Diagnosis not present

## 2012-08-22 DIAGNOSIS — E119 Type 2 diabetes mellitus without complications: Secondary | ICD-10-CM | POA: Diagnosis not present

## 2012-08-22 DIAGNOSIS — E1149 Type 2 diabetes mellitus with other diabetic neurological complication: Secondary | ICD-10-CM | POA: Diagnosis not present

## 2012-09-04 ENCOUNTER — Other Ambulatory Visit: Payer: Self-pay | Admitting: Internal Medicine

## 2012-09-04 ENCOUNTER — Ambulatory Visit (INDEPENDENT_AMBULATORY_CARE_PROVIDER_SITE_OTHER): Payer: Medicare Other | Admitting: *Deleted

## 2012-09-04 DIAGNOSIS — I495 Sick sinus syndrome: Secondary | ICD-10-CM

## 2012-09-04 LAB — PACEMAKER DEVICE OBSERVATION
AL AMPLITUDE: 2.4 mv
AL IMPEDENCE PM: 362.5 Ohm
ATRIAL PACING PM: 4
BATTERY VOLTAGE: 2.9478 V
RV LEAD IMPEDENCE PM: 462.5 Ohm
RV LEAD THRESHOLD: 0.5 V
VENTRICULAR PACING PM: 100

## 2012-09-04 NOTE — Progress Notes (Signed)
PPM check 

## 2012-09-15 ENCOUNTER — Emergency Department (HOSPITAL_COMMUNITY): Payer: Medicare Other

## 2012-09-15 ENCOUNTER — Emergency Department (HOSPITAL_COMMUNITY)
Admission: EM | Admit: 2012-09-15 | Discharge: 2012-09-15 | Disposition: A | Discharge status: Home / Self Care | Payer: Medicare Other | Attending: Emergency Medicine | Admitting: Emergency Medicine

## 2012-09-15 ENCOUNTER — Encounter (HOSPITAL_COMMUNITY): Payer: Self-pay

## 2012-09-15 DIAGNOSIS — Z7982 Long term (current) use of aspirin: Secondary | ICD-10-CM | POA: Insufficient documentation

## 2012-09-15 DIAGNOSIS — I129 Hypertensive chronic kidney disease with stage 1 through stage 4 chronic kidney disease, or unspecified chronic kidney disease: Secondary | ICD-10-CM | POA: Insufficient documentation

## 2012-09-15 DIAGNOSIS — Z8679 Personal history of other diseases of the circulatory system: Secondary | ICD-10-CM | POA: Diagnosis not present

## 2012-09-15 DIAGNOSIS — N181 Chronic kidney disease, stage 1: Secondary | ICD-10-CM | POA: Diagnosis not present

## 2012-09-15 DIAGNOSIS — Z79899 Other long term (current) drug therapy: Secondary | ICD-10-CM | POA: Insufficient documentation

## 2012-09-15 DIAGNOSIS — E119 Type 2 diabetes mellitus without complications: Secondary | ICD-10-CM | POA: Diagnosis not present

## 2012-09-15 DIAGNOSIS — R197 Diarrhea, unspecified: Secondary | ICD-10-CM | POA: Insufficient documentation

## 2012-09-15 DIAGNOSIS — E785 Hyperlipidemia, unspecified: Secondary | ICD-10-CM | POA: Diagnosis not present

## 2012-09-15 DIAGNOSIS — R143 Flatulence: Secondary | ICD-10-CM | POA: Diagnosis not present

## 2012-09-15 DIAGNOSIS — R141 Gas pain: Secondary | ICD-10-CM | POA: Diagnosis not present

## 2012-09-15 DIAGNOSIS — R109 Unspecified abdominal pain: Secondary | ICD-10-CM | POA: Diagnosis not present

## 2012-09-15 DIAGNOSIS — R142 Eructation: Secondary | ICD-10-CM | POA: Diagnosis not present

## 2012-09-15 DIAGNOSIS — K5900 Constipation, unspecified: Secondary | ICD-10-CM | POA: Diagnosis not present

## 2012-09-15 LAB — CBC
Hemoglobin: 13.2 g/dL (ref 13.0–17.0)
MCH: 26.6 pg (ref 26.0–34.0)
MCV: 80.3 fL (ref 78.0–100.0)
Platelets: 193 10*3/uL (ref 150–400)
RBC: 4.97 MIL/uL (ref 4.22–5.81)
WBC: 3 10*3/uL — ABNORMAL LOW (ref 4.0–10.5)

## 2012-09-15 LAB — BASIC METABOLIC PANEL
CO2: 23 mEq/L (ref 19–32)
Calcium: 9.2 mg/dL (ref 8.4–10.5)
Chloride: 105 mEq/L (ref 96–112)
Creatinine, Ser: 1.61 mg/dL — ABNORMAL HIGH (ref 0.50–1.35)
Glucose, Bld: 181 mg/dL — ABNORMAL HIGH (ref 70–99)
Sodium: 138 mEq/L (ref 135–145)

## 2012-09-15 MED ORDER — ONDANSETRON 4 MG PO TBDP
4.0000 mg | ORAL_TABLET | Freq: Once | ORAL | Status: AC
  Administered 2012-09-15: 4 mg via ORAL
  Filled 2012-09-15: qty 1

## 2012-09-15 MED ORDER — DIPHENOXYLATE-ATROPINE 2.5-0.025 MG PO TABS
2.0000 | ORAL_TABLET | Freq: Once | ORAL | Status: AC
  Administered 2012-09-15: 2 via ORAL
  Filled 2012-09-15: qty 2

## 2012-09-15 NOTE — ED Notes (Signed)
Pt reports "fullness" to his abd since last night, pt also reports diarrhea, and some burning to his throat.

## 2012-09-15 NOTE — ED Provider Notes (Signed)
History     CSN: [id number]  Arrival date & time      First MD Initiated Contact with Patient 09/15/12 0735      Chief Complaint  Patient presents with  . Bloated    (Consider location/radiation/quality/duration/timing/severity/associated sxs/prior treatment) HPI Kyle Schultz is a 79 y.o. male brought in by ambulance, who presents to the Emergency Department complaining of diarrhea all night. Denies nausea, vomiting. Has had some abdominal cramping pain. Denies fever, chills, cough, shortness of breath. He has some burning in his throat.  PCP Dr. Legrand Rams  Past Medical History  Diagnosis Date  . Sinus bradycardia   . First degree AV block   . Second degree AV block   . Hyperlipidemia   . Hypertension   . Diabetes mellitus   . CKD (chronic kidney disease), stage I     Past Surgical History  Procedure Laterality Date  . Umbilical hernia repair  04/2008  . Colonscopy      Family History  Problem Relation Age of Onset  . Heart disease Father   . Cancer Sister   . Cancer Sister   . Cancer Sister   . ALS Sister     History  Substance Use Topics  . Smoking status: Never Smoker   . Smokeless tobacco: Never Used  . Alcohol Use: No      Review of Systems  Constitutional: Negative for fever.       10 Systems reviewed and are negative for acute change except as noted in the HPI.  HENT: Negative for congestion.   Eyes: Negative for discharge and redness.  Respiratory: Negative for cough and shortness of breath.   Cardiovascular: Negative for chest pain.  Gastrointestinal: Positive for diarrhea. Negative for vomiting and abdominal pain.  Musculoskeletal: Negative for back pain.  Skin: Negative for rash.  Neurological: Negative for syncope, numbness and headaches.  Psychiatric/Behavioral:       No behavior change.    Allergies  Review of patient's allergies indicates no known allergies.  Home Medications   Current Outpatient Rx  Name  Route  Sig  Dispense   Refill  . aspirin 81 MG tablet   Oral   Take 81 mg by mouth daily.           . fenofibrate 160 MG tablet   Oral   Take 160 mg by mouth daily.         Marland Kitchen glipiZIDE (GLUCOTROL) 5 MG tablet   Oral   Take 5 mg by mouth 2 (two) times daily before a meal.          . linagliptin (TRADJENTA) 5 MG TABS tablet   Oral   Take 5 mg by mouth daily.         Marland Kitchen losartan (COZAAR) 100 MG tablet   Oral   Take 100 mg by mouth daily.          Marland Kitchen NIASPAN 500 MG CR tablet   Oral   Take 500 mg by mouth at bedtime.          Marland Kitchen ZOCOR 20 MG tablet      TAKE (1) TABLET BY MOUTH AT BEDTIME.   30 tablet   5     BP 152/73  Pulse 104  Temp(Src) 98.3 F (36.8 C)  Resp 18  Ht 6' (1.829 m)  Wt 220 lb (99.791 kg)  BMI 29.83 kg/m2  SpO2 98%  Physical Exam  Nursing note and vitals reviewed. Constitutional: He appears  well-developed and well-nourished.  Awake, alert, nontoxic appearance.  HENT:  Head: Atraumatic.  Right Ear: External ear normal.  Left Ear: External ear normal.  Mouth/Throat: Oropharynx is clear and moist.  Eyes: EOM are normal. Pupils are equal, round, and reactive to light. Right eye exhibits no discharge. Left eye exhibits no discharge.  Neck: Normal range of motion. Neck supple.  Cardiovascular: Normal rate and normal heart sounds.   Pulmonary/Chest: Effort normal and breath sounds normal. He exhibits no tenderness.  Abdominal: Soft. There is no tenderness. There is no rebound.  Bowel sounds present  Musculoskeletal: He exhibits no tenderness.  Baseline ROM, no obvious new focal weakness.  Neurological:  Mental status and motor strength appears baseline for patient and situation.  Skin: No rash noted.  Psychiatric: He has a normal mood and affect.    ED Course  Procedures (including critical care time)  Labs Reviewed  CBC  BASIC METABOLIC PANEL      MDM  Patient with diarrhea since last night.         Gypsy Balsam. Olin Hauser, MD 09/15/12 [id number]

## 2012-09-15 NOTE — ED Provider Notes (Signed)
Patient had good bowel movement. No acute abdomen. X-ray nonacute  Nat Christen, MD 09/15/12 1026

## 2012-09-15 NOTE — ED Notes (Signed)
Pt to xray

## 2012-09-21 ENCOUNTER — Encounter: Payer: Self-pay | Admitting: Internal Medicine

## 2012-09-21 DIAGNOSIS — I10 Essential (primary) hypertension: Secondary | ICD-10-CM | POA: Diagnosis not present

## 2012-09-21 DIAGNOSIS — E119 Type 2 diabetes mellitus without complications: Secondary | ICD-10-CM | POA: Diagnosis not present

## 2012-09-21 DIAGNOSIS — E7800 Pure hypercholesterolemia, unspecified: Secondary | ICD-10-CM | POA: Diagnosis not present

## 2012-09-23 DIAGNOSIS — K5900 Constipation, unspecified: Secondary | ICD-10-CM | POA: Diagnosis not present

## 2012-10-22 ENCOUNTER — Emergency Department (HOSPITAL_COMMUNITY)
Admission: EM | Admit: 2012-10-22 | Discharge: 2012-10-22 | Disposition: A | Discharge status: Home / Self Care | Payer: Medicare Other | Attending: Emergency Medicine | Admitting: Emergency Medicine

## 2012-10-22 ENCOUNTER — Encounter (HOSPITAL_COMMUNITY): Payer: Self-pay | Admitting: Emergency Medicine

## 2012-10-22 DIAGNOSIS — I129 Hypertensive chronic kidney disease with stage 1 through stage 4 chronic kidney disease, or unspecified chronic kidney disease: Secondary | ICD-10-CM | POA: Diagnosis not present

## 2012-10-22 DIAGNOSIS — K645 Perianal venous thrombosis: Secondary | ICD-10-CM | POA: Diagnosis not present

## 2012-10-22 DIAGNOSIS — E785 Hyperlipidemia, unspecified: Secondary | ICD-10-CM | POA: Diagnosis not present

## 2012-10-22 DIAGNOSIS — Z79899 Other long term (current) drug therapy: Secondary | ICD-10-CM | POA: Diagnosis not present

## 2012-10-22 DIAGNOSIS — E119 Type 2 diabetes mellitus without complications: Secondary | ICD-10-CM | POA: Insufficient documentation

## 2012-10-22 DIAGNOSIS — N181 Chronic kidney disease, stage 1: Secondary | ICD-10-CM | POA: Insufficient documentation

## 2012-10-22 DIAGNOSIS — Z7982 Long term (current) use of aspirin: Secondary | ICD-10-CM | POA: Insufficient documentation

## 2012-10-22 DIAGNOSIS — Z8679 Personal history of other diseases of the circulatory system: Secondary | ICD-10-CM | POA: Diagnosis not present

## 2012-10-22 MED ORDER — LIDOCAINE-EPINEPHRINE 2 %-1:100000 IJ SOLN: 1.7000 mL | Freq: Once | INTRAMUSCULAR | Status: DC

## 2012-10-22 MED ORDER — LIDOCAINE-EPINEPHRINE (PF) 1 %-1:200000 IJ SOLN
INTRAMUSCULAR | Status: AC
  Administered 2012-10-22: 08:00:00
  Filled 2012-10-22: qty 10

## 2012-10-22 MED ORDER — AMOXICILLIN 500 MG PO CAPS: 500.0000 mg | ORAL_CAPSULE | Freq: Three times a day (TID) | ORAL | Status: DC

## 2012-10-22 MED ORDER — DOCUSATE SODIUM 100 MG PO CAPS: 100.0000 mg | ORAL_CAPSULE | Freq: Two times a day (BID) | ORAL | Status: DC

## 2012-10-22 NOTE — ED Notes (Signed)
Patient c/o a "pimple" to buttock that he states came up a few days ago.  Patient states has mild pain.

## 2012-10-22 NOTE — ED Provider Notes (Signed)
History  This chart was scribed for Sharyon Cable, MD, by Truddie Coco, ED Scribe. This patient was seen in room APA17/APA17 and the patient's care was started at 7:00 AM   CSN: [id number]  Arrival date & time 10/22/12  0620   First MD Initiated Contact with Patient 10/22/12 [phone]      Chief Complaint  Patient presents with  . Abscess     The history is provided by the patient. No language interpreter was used.   Kyle Schultz is a 79 y.o. male who presents to the Emergency Department complaining of a "pimple" to the right buttocks that started about one week ago.  Pt states the area is sore, but the soreness has improved somewhat.  He denies fever, chills, nausea, or vomiting.  He has tried nothing to improve symptoms.   Sitting and palpation worsen the pain    Past Medical History  Diagnosis Date  . Sinus bradycardia   . First degree AV block   . Second degree AV block   . Hyperlipidemia   . Hypertension   . Diabetes mellitus   . CKD (chronic kidney disease), stage I     Past Surgical History  Procedure Laterality Date  . Umbilical hernia repair  04/2008  . Colonscopy      Family History  Problem Relation Age of Onset  . Heart disease Father   . Cancer Sister   . Cancer Sister   . Cancer Sister   . ALS Sister     History  Substance Use Topics  . Smoking status: Never Smoker   . Smokeless tobacco: Never Used  . Alcohol Use: No      Review of Systems  Constitutional: Negative for fever and chills.  Gastrointestinal: Negative for nausea and vomiting.  Skin:       An area of soreness along with a bump to the right buttocks.    All other systems reviewed and are negative.    Allergies  Review of patient's allergies indicates no known allergies.  Home Medications   Current Outpatient Rx  Name  Route  Sig  Dispense  Refill  . aspirin 81 MG tablet   Oral   Take 81 mg by mouth daily.           . fenofibrate 160 MG tablet   Oral   Take 160  mg by mouth daily.         Marland Kitchen glipiZIDE (GLUCOTROL) 5 MG tablet   Oral   Take 5 mg by mouth 2 (two) times daily before a meal.          . linagliptin (TRADJENTA) 5 MG TABS tablet   Oral   Take 5 mg by mouth daily.         Marland Kitchen losartan (COZAAR) 100 MG tablet   Oral   Take 100 mg by mouth daily.          Marland Kitchen NIASPAN 500 MG CR tablet   Oral   Take 500 mg by mouth at bedtime.          Marland Kitchen ZOCOR 20 MG tablet      TAKE (1) TABLET BY MOUTH AT BEDTIME.   30 tablet   5     BP 145/80  Pulse 80  Temp(Src) 98 F (36.7 C)  Resp 20  Ht 6' (1.829 m)  Wt 220 lb (99.791 kg)  BMI 29.83 kg/m2  SpO2 97%  Physical Exam CONSTITUTIONAL: Well developed/well nourished HEAD:  Normocephalic/atraumatic EYES: EOMI/PERRL ENMT: Mucous membranes moist NECK: supple no meningeal signs CV: S1/S2 noted, no murmurs/rubs/gallops noted LUNGS: Lungs are clear to auscultation bilaterally, no apparent distress ABDOMEN: soft, nontender, no rebound or guarding Rectal - large thrombosed external hemorrhoid noted at 3 oclock position.  No erythema, fluctuance or induration is noted Chaperone present GU:no cva tenderness. No erythema/swelling is noted in perineum/scrotum NEURO: Pt is awake/alert, moves all extremitiesx4 EXTREMITIES: pulses normal, full ROM SKIN: warm, color normal PSYCH: no abnormalities of mood noted  ED Course  Procedures   Thrombosed External Hemorrhoid Incision and Drainage Performed by: Sharyon Cable Consent: Verbal consent obtained. Risks and benefits: risks, benefits and alternatives were discussed Time out was called prior to procedure Type: thrombosed hemorroid  Body area: rectum  Anesthesia: local infiltration  Incision was made with a scalpel.  Local anesthetic: lidocaine 1% with epinephrine  Anesthetic total: 5 ml  Complexity: complex Incision was made and large amounts of clotted blood were removed   Patient tolerance: Patient tolerated the procedure  well with no immediate complications.     DIAGNOSTIC STUDIES: Oxygen Saturation is 97% on room air, normal by my interpretation.    COORDINATION OF CARE:  7:11 AM Discussed course of care with pt which includes possible I&D of abscess after further examination.  Pt understands and agrees.  Pt tolerated procedure well and good reduction in size of hemorrhoid.   Will start abx to help prevent infection and started on colace for constipation    MDM  Nursing notes including past medical history and social history reviewed and considered in documentation  I personally performed the services described in this documentation, which was scribed in my presence. The recorded information has been reviewed and is accurate.           Sharyon Cable, MD 10/22/12 [phone]

## 2012-10-22 NOTE — ED Notes (Signed)
Assisted EDP with I&D of hemorroid. Pt cleaned up and tolerated procedure well. nad noted.

## 2012-10-31 DIAGNOSIS — E119 Type 2 diabetes mellitus without complications: Secondary | ICD-10-CM | POA: Diagnosis not present

## 2012-10-31 DIAGNOSIS — E1149 Type 2 diabetes mellitus with other diabetic neurological complication: Secondary | ICD-10-CM | POA: Diagnosis not present

## 2012-11-02 DIAGNOSIS — K649 Unspecified hemorrhoids: Secondary | ICD-10-CM | POA: Diagnosis not present

## 2012-12-21 DIAGNOSIS — E7800 Pure hypercholesterolemia, unspecified: Secondary | ICD-10-CM | POA: Diagnosis not present

## 2012-12-21 DIAGNOSIS — E669 Obesity, unspecified: Secondary | ICD-10-CM | POA: Diagnosis not present

## 2012-12-21 DIAGNOSIS — E119 Type 2 diabetes mellitus without complications: Secondary | ICD-10-CM | POA: Diagnosis not present

## 2012-12-21 DIAGNOSIS — I10 Essential (primary) hypertension: Secondary | ICD-10-CM | POA: Diagnosis not present

## 2013-01-09 DIAGNOSIS — E119 Type 2 diabetes mellitus without complications: Secondary | ICD-10-CM | POA: Diagnosis not present

## 2013-01-09 DIAGNOSIS — E1149 Type 2 diabetes mellitus with other diabetic neurological complication: Secondary | ICD-10-CM | POA: Diagnosis not present

## 2013-01-22 ENCOUNTER — Encounter (HOSPITAL_COMMUNITY): Payer: Self-pay | Admitting: *Deleted

## 2013-01-22 ENCOUNTER — Emergency Department (HOSPITAL_COMMUNITY)
Admission: EM | Admit: 2013-01-22 | Discharge: 2013-01-22 | Disposition: A | Discharge status: Home / Self Care | Payer: Medicare Other | Attending: Emergency Medicine | Admitting: Emergency Medicine

## 2013-01-22 DIAGNOSIS — Z79899 Other long term (current) drug therapy: Secondary | ICD-10-CM | POA: Insufficient documentation

## 2013-01-22 DIAGNOSIS — R51 Headache: Secondary | ICD-10-CM | POA: Insufficient documentation

## 2013-01-22 DIAGNOSIS — R519 Headache, unspecified: Secondary | ICD-10-CM

## 2013-01-22 DIAGNOSIS — E119 Type 2 diabetes mellitus without complications: Secondary | ICD-10-CM | POA: Insufficient documentation

## 2013-01-22 DIAGNOSIS — N181 Chronic kidney disease, stage 1: Secondary | ICD-10-CM | POA: Diagnosis not present

## 2013-01-22 DIAGNOSIS — Z7982 Long term (current) use of aspirin: Secondary | ICD-10-CM | POA: Insufficient documentation

## 2013-01-22 DIAGNOSIS — Z950 Presence of cardiac pacemaker: Secondary | ICD-10-CM | POA: Diagnosis not present

## 2013-01-22 DIAGNOSIS — I129 Hypertensive chronic kidney disease with stage 1 through stage 4 chronic kidney disease, or unspecified chronic kidney disease: Secondary | ICD-10-CM | POA: Insufficient documentation

## 2013-01-22 DIAGNOSIS — E785 Hyperlipidemia, unspecified: Secondary | ICD-10-CM | POA: Diagnosis not present

## 2013-01-22 DIAGNOSIS — Z8679 Personal history of other diseases of the circulatory system: Secondary | ICD-10-CM | POA: Diagnosis not present

## 2013-01-22 LAB — COMPREHENSIVE METABOLIC PANEL
BUN: 21 mg/dL (ref 6–23)
CO2: 29 mEq/L (ref 19–32)
Calcium: 9.9 mg/dL (ref 8.4–10.5)
Creatinine, Ser: 1.44 mg/dL — ABNORMAL HIGH (ref 0.50–1.35)
GFR calc Af Amer: 51 mL/min — ABNORMAL LOW (ref >90)
GFR calc non Af Amer: 44 mL/min — ABNORMAL LOW (ref >90)
Glucose, Bld: 143 mg/dL — ABNORMAL HIGH (ref 70–99)
Total Protein: 8 g/dL (ref 6.0–8.3)

## 2013-01-22 LAB — CBC WITH DIFFERENTIAL/PLATELET
Eosinophils Absolute: 0.1 10*3/uL (ref 0.0–0.7)
Eosinophils Relative: 3 % (ref 0–5)
HCT: 40.5 % (ref 39.0–52.0)
Lymphocytes Relative: 26 % (ref 12–46)
Lymphs Abs: 1.2 10*3/uL (ref 0.7–4.0)
MCH: 26.3 pg (ref 26.0–34.0)
MCV: 79.4 fL (ref 78.0–100.0)
Monocytes Absolute: 0.2 10*3/uL (ref 0.1–1.0)
RBC: 5.1 MIL/uL (ref 4.22–5.81)
WBC: 4.5 10*3/uL (ref 4.0–10.5)

## 2013-01-22 LAB — GLUCOSE, CAPILLARY: Glucose-Capillary: 106 mg/dL — ABNORMAL HIGH (ref 70–99)

## 2013-01-22 MED ORDER — ACETAMINOPHEN 325 MG PO TABS
650.0000 mg | ORAL_TABLET | Freq: Once | ORAL | Status: AC
  Administered 2013-01-22: 650 mg via ORAL
  Filled 2013-01-22: qty 2

## 2013-01-22 MED ORDER — TETRACAINE HCL 0.5 % OP SOLN
2.0000 [drp] | Freq: Once | OPHTHALMIC | Status: DC
  Filled 2013-01-22: qty 2

## 2013-01-22 NOTE — ED Notes (Signed)
Headache . Rt side of head, Denies injury,  Has not taken anything for it, No N/V  Alert, Denies ext . Weakness

## 2013-01-22 NOTE — ED Notes (Signed)
Pt c/o right side HA started yesterday, also c/o pressure to right eye with bending over, states cough few times recently, also felt pressure to right ear earlier

## 2013-01-22 NOTE — ED Provider Notes (Signed)
History  This chart was scribed for Kyle Norrie, MD by Jenne Campus, ED Scribe. This patient was seen in room APA11/APA11 and the patient's care was started at 2:01 PM.  CSN: [id number]  Arrival date & time 01/22/13  1135   First MD Initiated Contact with Patient 01/22/13 1314     Chief Complaint  Patient presents with  . Headache    Patient is a 79 y.o. male presenting with headaches. The history is provided by the patient. No language interpreter was used.  Headache Pain location:  Frontal Quality: tight. Radiates to:  Does not radiate Onset quality: rapid. Duration:  1 day Timing:  Constant Progression:  Unchanged Chronicity:  New Similar to prior headaches: yes   Context: emotional stress   Ineffective treatments:  None tried Associated symptoms: no nausea, no numbness and no vomiting     HPI Comments: Kyle Schultz is a 79 y.o. male who presents to the Emergency Department complaining of a rapid onset HA that started in the the right side of his head, then into his right eye then in the frontal area of his head occipital region  with an onset yesterday. He reports that the pain feels like a tightness that is worsened with bending over. States when he bends over he gets pressure in his right eye. He denies any other alleviating factors. He admits that he found his brother in a "coma" yesterday around the time of the onset of the HA. EMS was called and had to treat his brother for a low blood glucose. Pt states that his brother is back to baseline today and waiting for him out in the waiting room. He denies nausea, emesis and visual disturbances as associated symptoms. He denies having difficulty walking. He admits that he has experienced prior episodes but "it was too long ago to remember". Pt denies taking OTC medications at home to improve symptoms. He has a h/o DM, HLD and HTN. Pt denies smoking and alcohol use.  PCP is Dr. Legrand Rams  Past Medical History  Diagnosis Date   . Sinus bradycardia   . First degree AV block   . Second degree AV block   . Hyperlipidemia   . Hypertension   . Diabetes mellitus   . CKD (chronic kidney disease), stage I    Past Surgical History  Procedure Laterality Date  . Umbilical hernia repair  04/2008  . Colonscopy    . Pacemaker insertion    . Colonoscopy    . Hemorrhoid surgery     Family History  Problem Relation Age of Onset  . Heart disease Father   . Cancer Sister   . Cancer Sister   . Cancer Sister   . ALS Sister    History  Substance Use Topics  . Smoking status: Never Smoker   . Smokeless tobacco: Never Used  . Alcohol Use: No  lives with grandson   Review of Systems  Eyes: Negative for visual disturbance.  Gastrointestinal: Negative for nausea and vomiting.  Neurological: Positive for headaches. Negative for numbness.  All other systems reviewed and are negative.    Allergies  Review of patient's allergies indicates no known allergies.  Home Medications   Current Outpatient Rx  Name  Route  Sig  Dispense  Refill  . aspirin 81 MG tablet   Oral   Take 81 mg by mouth daily.           Marland Kitchen docusate sodium (COLACE) 100 MG capsule  Oral   Take 100 mg by mouth 2 (two) times daily as needed for constipation.         . fenofibrate 160 MG tablet   Oral   Take 160 mg by mouth daily.         Marland Kitchen glipiZIDE (GLUCOTROL) 5 MG tablet   Oral   Take 5 mg by mouth daily.          Marland Kitchen linagliptin (TRADJENTA) 5 MG TABS tablet   Oral   Take 5 mg by mouth daily.         Marland Kitchen losartan (COZAAR) 100 MG tablet   Oral   Take 100 mg by mouth daily.          . simvastatin (ZOCOR) 20 MG tablet   Oral   Take 20 mg by mouth every evening.          Triage Vitals: BP 138/80  Pulse 90  Temp(Src) 97.8 F (36.6 C) (Oral)  Resp 16  Ht 6' (1.829 m)  Wt 228 lb (103.42 kg)  BMI 30.92 kg/m2  SpO2 97%  Vital signs normal    Physical Exam  Nursing note and vitals reviewed. Constitutional: He is  oriented to person, place, and time. He appears well-developed and well-nourished.  Non-toxic appearance. He does not appear ill. No distress.  HENT:  Head: Normocephalic and atraumatic.  Right Ear: External ear normal.  Left Ear: External ear normal.  Nose: Nose normal. No mucosal edema or rhinorrhea.  Mouth/Throat: Oropharynx is clear and moist and mucous membranes are normal. No dental abscesses or edematous.  Non-tender scalp  Eyes: Conjunctivae and EOM are normal. Pupils are equal, round, and reactive to light.  Tetracaine placed in his right eye. Tono Pen used to get pressure on 10 in his right eye  Neck: Normal range of motion and full passive range of motion without pain. Neck supple.  Cardiovascular: Normal rate, regular rhythm and normal heart sounds.  Exam reveals no gallop and no friction rub.   No murmur heard. Pulmonary/Chest: Effort normal and breath sounds normal. No respiratory distress. He has no wheezes. He has no rhonchi. He has no rales. He exhibits no tenderness and no crepitus.  Abdominal: Soft. Normal appearance and bowel sounds are normal. He exhibits no distension. There is no tenderness. There is no rebound and no guarding.  Musculoskeletal: Normal range of motion. He exhibits no edema and no tenderness.  Moves all extremities well.   Neurological: He is alert and oriented to person, place, and time. He has normal strength. No cranial nerve deficit.  Skin: Skin is warm, dry and intact. No rash noted. No erythema. No pallor.  Psychiatric: He has a normal mood and affect. His speech is normal and behavior is normal. His mood appears not anxious.    ED Course  Procedures (including critical care time)  Medications  tetracaine (PONTOCAINE) 0.5 % ophthalmic solution 2 drop (not administered)  acetaminophen (TYLENOL) tablet 650 mg (650 mg Oral Given 01/22/13 1433)    DIAGNOSTIC STUDIES: Oxygen Saturation is 97% on room air, normal by my interpretation.     COORDINATION OF CARE: 2:30 PM-Discussed treatment plan which includes CBC panel and CMP with pt at bedside and pt agreed to plan.   3:12 PM-Pt rechecked and is sleeping sitting on the end of his stretcher with his head on his chest. Upon waking, he states that his HA has improved with medication listed above. He states he still feels pressure in his  right eye when he bends over. Pt reports that he is ready for discharge.  14:35 Visual Acuity DM Visual Acuity - Bilateral Near: 20/50 ; R Near: 20/50 ; L Near: 20/40  17:10 Pt states his pain is totally gone now.   Pressure obtained in his right eye and was normal, no signs of glaucoma, stroke or worrisome for other serious etiology of his headache.   Results for orders placed during the hospital encounter of 01/22/13  CBC WITH DIFFERENTIAL      Result Value Range   WBC 4.5  4.0 - 10.5 K/uL   RBC 5.10  4.22 - 5.81 MIL/uL   Hemoglobin 13.4  13.0 - 17.0 g/dL   HCT 40.5  39.0 - 52.0 %   MCV 79.4  78.0 - 100.0 fL   MCH 26.3  26.0 - 34.0 pg   MCHC 33.1  30.0 - 36.0 g/dL   RDW 14.3  11.5 - 15.5 %   Platelets 225  150 - 400 K/uL   Neutrophils Relative % 66  43 - 77 %   Neutro Abs 3.0  1.7 - 7.7 K/uL   Lymphocytes Relative 26  12 - 46 %   Lymphs Abs 1.2  0.7 - 4.0 K/uL   Monocytes Relative 5  3 - 12 %   Monocytes Absolute 0.2  0.1 - 1.0 K/uL   Eosinophils Relative 3  0 - 5 %   Eosinophils Absolute 0.1  0.0 - 0.7 K/uL   Basophils Relative 0  0 - 1 %   Basophils Absolute 0.0  0.0 - 0.1 K/uL  COMPREHENSIVE METABOLIC PANEL      Result Value Range   Sodium 139  135 - 145 mEq/L   Potassium 3.7  3.5 - 5.1 mEq/L   Chloride 103  96 - 112 mEq/L   CO2 29  19 - 32 mEq/L   Glucose, Bld 143 (*) 70 - 99 mg/dL   BUN 21  6 - 23 mg/dL   Creatinine, Ser 1.44 (*) 0.50 - 1.35 mg/dL   Calcium 9.9  8.4 - 10.5 mg/dL   Total Protein 8.0  6.0 - 8.3 g/dL   Albumin 4.4  3.5 - 5.2 g/dL   AST 34  0 - 37 U/L   ALT 37  0 - 53 U/L   Alkaline Phosphatase 55  39  - 117 U/L   Total Bilirubin 0.5  0.3 - 1.2 mg/dL   GFR calc non Af Amer 44 (*) >90 mL/min   GFR calc Af Amer 51 (*) >90 mL/min  GLUCOSE, CAPILLARY      Result Value Range   Glucose-Capillary 106 (*) 70 - 99 mg/dL   Comment 1 Documented in Chart     Comment 2 Notify RN     Laboratory interpretation all normal   1. Headache     Plan discharge   Rolland Porter, MD, FACEP   MDM    I personally performed the services described in this documentation, which was scribed in my presence. The recorded information has been reviewed and considered.  Rolland Porter, MD, FACEP    Kyle Norrie, MD 01/22/13 [phone]

## 2013-02-20 ENCOUNTER — Encounter: Payer: Self-pay | Admitting: Internal Medicine

## 2013-02-20 ENCOUNTER — Ambulatory Visit (INDEPENDENT_AMBULATORY_CARE_PROVIDER_SITE_OTHER): Payer: Medicare Other | Admitting: Internal Medicine

## 2013-02-20 VITALS — BP 154/95 | HR 89 | Ht 73.0 in | Wt 233.0 lb

## 2013-02-20 DIAGNOSIS — I10 Essential (primary) hypertension: Secondary | ICD-10-CM

## 2013-02-20 DIAGNOSIS — I495 Sick sinus syndrome: Secondary | ICD-10-CM

## 2013-02-20 DIAGNOSIS — Z950 Presence of cardiac pacemaker: Secondary | ICD-10-CM | POA: Diagnosis not present

## 2013-02-20 LAB — PACEMAKER DEVICE OBSERVATION
AL AMPLITUDE: 2.3 mv
AL IMPEDENCE PM: 362.5 Ohm
BAMS-0003: 70 {beats}/min
BATTERY VOLTAGE: 2.9478 V
RV LEAD AMPLITUDE: 12 mv
RV LEAD IMPEDENCE PM: 425 Ohm

## 2013-02-20 NOTE — Assessment & Plan Note (Signed)
His blood pressure is elevated today. No change in medical therapy. He admits to sodium indiscretion and I have discussed the importance of a low sodium diet.

## 2013-02-20 NOTE — Patient Instructions (Signed)
Your physician recommends that you schedule a follow-up appointment in: 6 months with Nevin Bloodgood for a device check  Your physician wants you to follow-up in: 1 year with Dr. Lovena Le.  You will receive a reminder letter in the mail two months in advance. If you don't receive a letter, please call our office to schedule the follow-up appointment.

## 2013-02-20 NOTE — Assessment & Plan Note (Signed)
His [facility] DDD pm is working normally. Will recheck in several months.

## 2013-02-20 NOTE — Progress Notes (Signed)
HPI [name] returns today for followup. He is a pleasant 79 yo man with a h/o HTN, symptomatic bradycardia, s/p PPM insertion. In the interim he has done well. He denies chest pain, sob, or syncope. No edema. No Known Allergies   Current Outpatient Prescriptions  Medication Sig Dispense Refill  . aspirin 81 MG tablet Take 81 mg by mouth daily.        Marland Kitchen docusate sodium (COLACE) 100 MG capsule Take 100 mg by mouth 2 (two) times daily as needed for constipation.      . fenofibrate 160 MG tablet Take 160 mg by mouth daily.      Marland Kitchen glipiZIDE (GLUCOTROL) 5 MG tablet Take 5 mg by mouth daily.       Marland Kitchen linagliptin (TRADJENTA) 5 MG TABS tablet Take 5 mg by mouth daily.      Marland Kitchen losartan (COZAAR) 100 MG tablet Take 100 mg by mouth daily.       . simvastatin (ZOCOR) 20 MG tablet Take 20 mg by mouth every evening.       No current facility-administered medications for this visit.     Past Medical History  Diagnosis Date  . Sinus bradycardia   . First degree AV block   . Second degree AV block   . Hyperlipidemia   . Hypertension   . Diabetes mellitus   . CKD (chronic kidney disease), stage I     ROS:   All systems reviewed and negative except as noted in the HPI.   Past Surgical History  Procedure Laterality Date  . Umbilical hernia repair  04/2008  . Colonscopy    . Pacemaker insertion    . Colonoscopy    . Hemorrhoid surgery       Family History  Problem Relation Age of Onset  . Heart disease Father   . Cancer Sister   . Cancer Sister   . Cancer Sister   . ALS Sister      History   Social History  . Marital Status: Widowed    Spouse Name: N/A    Number of Children: N/A  . Years of Education: N/A   Occupational History  . Retired, Optometrist Tobacco    Social History Main Topics  . Smoking status: Never Smoker   . Smokeless tobacco: Never Used  . Alcohol Use: No  . Drug Use: No  . Sexually Active: Not on file   Other Topics Concern  . Not on file   Social  History Narrative   Widowed   1 child   Walks daily     BP 154/95  Pulse 89  Ht 6\' 1"  (1.854 m)  Wt 233 lb (105.688 kg)  BMI 30.75 kg/m2  Physical Exam:  Well appearing elderly man, NAD HEENT: Unremarkable Neck:  No JVD, no thyromegally Lungs:  Clear with no wheezes, rales, or rhonchi HEART:  Regular rate rhythm, no murmurs, no rubs, no clicks Abd:  soft, positive bowel sounds, no organomegally, no rebound, no guarding Ext:  2 plus pulses, no edema, no cyanosis, no clubbing Skin:  No rashes no nodules Neuro:  CN II through XII intact, motor grossly intact   DEVICE  Normal device function.  See PaceArt for details.   Assess/Plan:

## 2013-02-23 ENCOUNTER — Encounter: Payer: Self-pay | Admitting: Internal Medicine

## 2013-03-20 DIAGNOSIS — E119 Type 2 diabetes mellitus without complications: Secondary | ICD-10-CM | POA: Diagnosis not present

## 2013-03-20 DIAGNOSIS — E1149 Type 2 diabetes mellitus with other diabetic neurological complication: Secondary | ICD-10-CM | POA: Diagnosis not present

## 2013-03-22 DIAGNOSIS — I10 Essential (primary) hypertension: Secondary | ICD-10-CM | POA: Diagnosis not present

## 2013-03-22 DIAGNOSIS — E7800 Pure hypercholesterolemia, unspecified: Secondary | ICD-10-CM | POA: Diagnosis not present

## 2013-03-22 DIAGNOSIS — Z23 Encounter for immunization: Secondary | ICD-10-CM | POA: Diagnosis not present

## 2013-03-22 DIAGNOSIS — E119 Type 2 diabetes mellitus without complications: Secondary | ICD-10-CM | POA: Diagnosis not present

## 2013-04-19 ENCOUNTER — Encounter (HOSPITAL_COMMUNITY): Payer: Self-pay | Admitting: Emergency Medicine

## 2013-04-19 ENCOUNTER — Emergency Department (HOSPITAL_COMMUNITY)
Admission: EM | Admit: 2013-04-19 | Discharge: 2013-04-19 | Disposition: A | Discharge status: Home / Self Care | Payer: Medicare Other | Attending: Emergency Medicine | Admitting: Emergency Medicine

## 2013-04-19 DIAGNOSIS — E785 Hyperlipidemia, unspecified: Secondary | ICD-10-CM | POA: Diagnosis not present

## 2013-04-19 DIAGNOSIS — Z7982 Long term (current) use of aspirin: Secondary | ICD-10-CM | POA: Insufficient documentation

## 2013-04-19 DIAGNOSIS — E119 Type 2 diabetes mellitus without complications: Secondary | ICD-10-CM | POA: Diagnosis not present

## 2013-04-19 DIAGNOSIS — Z79899 Other long term (current) drug therapy: Secondary | ICD-10-CM | POA: Diagnosis not present

## 2013-04-19 DIAGNOSIS — N181 Chronic kidney disease, stage 1: Secondary | ICD-10-CM | POA: Diagnosis not present

## 2013-04-19 DIAGNOSIS — L909 Atrophic disorder of skin, unspecified: Secondary | ICD-10-CM | POA: Diagnosis not present

## 2013-04-19 DIAGNOSIS — I129 Hypertensive chronic kidney disease with stage 1 through stage 4 chronic kidney disease, or unspecified chronic kidney disease: Secondary | ICD-10-CM | POA: Insufficient documentation

## 2013-04-19 DIAGNOSIS — L918 Other hypertrophic disorders of the skin: Secondary | ICD-10-CM

## 2013-04-19 NOTE — ED Provider Notes (Signed)
CSN: [id number]     Arrival date & time 04/19/13  1342 History   First MD Initiated Contact with Patient 04/19/13 1408     No chief complaint on file.   HPI  Patient's had a dry heart area of skin on his right medial ankle for several years. He was bathing today and he states that it "came off" started to bleed. It is hanging by a little skin remnant.  Past Medical History  Diagnosis Date  . Sinus bradycardia   . First degree AV block   . Second degree AV block   . Hyperlipidemia   . Hypertension   . Diabetes mellitus   . CKD (chronic kidney disease), stage I    Past Surgical History  Procedure Laterality Date  . Umbilical hernia repair  04/2008  . Colonscopy    . Pacemaker insertion    . Colonoscopy    . Hemorrhoid surgery     Family History  Problem Relation Age of Onset  . Heart disease Father   . Cancer Sister   . Cancer Sister   . Cancer Sister   . ALS Sister    History  Substance Use Topics  . Smoking status: Never Smoker   . Smokeless tobacco: Never Used  . Alcohol Use: No    Review of Systems  Skin:       Skin tag partially avulsed his right medial ankle. Some bleeding at home. No bleeding evident  Now.    Allergies  Review of patient's allergies indicates no known allergies.  Home Medications   Current Outpatient Rx  Name  Route  Sig  Dispense  Refill  . aspirin 81 MG tablet   Oral   Take 81 mg by mouth every evening.          . fenofibrate 160 MG tablet   Oral   Take 160 mg by mouth daily.         Marland Kitchen glipiZIDE (GLUCOTROL) 5 MG tablet   Oral   Take 5 mg by mouth daily.          Marland Kitchen losartan (COZAAR) 100 MG tablet   Oral   Take 100 mg by mouth daily.          . simvastatin (ZOCOR) 20 MG tablet   Oral   Take 20 mg by mouth every evening.         . docusate sodium (COLACE) 100 MG capsule   Oral   Take 100 mg by mouth daily as needed for constipation.          Marland Kitchen linagliptin (TRADJENTA) 5 MG TABS tablet   Oral   Take 5 mg by  mouth daily.          BP 153/70  Pulse 79  Temp(Src) 98.1 F (36.7 C) (Oral)  Resp 20  Ht 6' (1.829 m)  Wt 228 lb (103.42 kg)  BMI 30.92 kg/m2  SpO2 98% Physical Exam  Skin:  Exam to the right medial ankle does show some dilated varicosities. There is a hard firm keratinized skin tag that is minimally attached to the right medial ankle. No surrounding erythema. No active bleeding.    ED Course  Procedures (including critical care time) Labs Review Labs Reviewed - No data to display Imaging Review No results found.  EKG Interpretation   None       MDM   1. Cutaneous skin tags     procedure. After Betadine prep using a suture removal  kit with forceps and iris scissors this was removed. There is no bleeding. Antibiotic ointment and gauze dressing and Coban applied. Discharged home. Recheck bleeding or signs of infection.    Lebron Quam, MD 04/19/13 1420

## 2013-04-19 NOTE — ED Notes (Signed)
Patient with no complaints at this time. Respirations even and unlabored. Skin warm/dry. Discharge instructions reviewed with patient at this time. Patient given opportunity to voice concerns/ask questions. Patient discharged at this time and left Emergency Department with steady gait.   

## 2013-04-19 NOTE — ED Notes (Signed)
?  insect bite to rt ankle,

## 2013-05-29 DIAGNOSIS — B351 Tinea unguium: Secondary | ICD-10-CM | POA: Diagnosis not present

## 2013-05-29 DIAGNOSIS — E1149 Type 2 diabetes mellitus with other diabetic neurological complication: Secondary | ICD-10-CM | POA: Diagnosis not present

## 2013-06-21 DIAGNOSIS — I10 Essential (primary) hypertension: Secondary | ICD-10-CM | POA: Diagnosis not present

## 2013-06-21 DIAGNOSIS — E7800 Pure hypercholesterolemia, unspecified: Secondary | ICD-10-CM | POA: Diagnosis not present

## 2013-06-21 DIAGNOSIS — M1990 Unspecified osteoarthritis, unspecified site: Secondary | ICD-10-CM | POA: Diagnosis not present

## 2013-06-21 DIAGNOSIS — E119 Type 2 diabetes mellitus without complications: Secondary | ICD-10-CM | POA: Diagnosis not present

## 2013-08-07 DIAGNOSIS — E1149 Type 2 diabetes mellitus with other diabetic neurological complication: Secondary | ICD-10-CM | POA: Diagnosis not present

## 2013-08-07 DIAGNOSIS — B351 Tinea unguium: Secondary | ICD-10-CM | POA: Diagnosis not present

## 2013-08-24 ENCOUNTER — Ambulatory Visit (INDEPENDENT_AMBULATORY_CARE_PROVIDER_SITE_OTHER): Payer: Medicare Other | Admitting: *Deleted

## 2013-08-24 DIAGNOSIS — I495 Sick sinus syndrome: Secondary | ICD-10-CM | POA: Diagnosis not present

## 2013-08-24 LAB — MDC_IDC_ENUM_SESS_TYPE_INCLINIC
Brady Statistic RV Percent Paced: 99.33 %
Implantable Pulse Generator Model: 2110
Implantable Pulse Generator Serial Number: 2285839
Lead Channel Impedance Value: 412.5 Ohm
Lead Channel Sensing Intrinsic Amplitude: 12 mV
Lead Channel Sensing Intrinsic Amplitude: 5 mV
Lead Channel Setting Pacing Amplitude: 1.5 V
MDC IDC MSMT BATTERY REMAINING LONGEVITY: 96 mo
MDC IDC MSMT BATTERY VOLTAGE: 2.93 V
MDC IDC MSMT LEADCHNL RA IMPEDANCE VALUE: 362.5 Ohm
MDC IDC MSMT LEADCHNL RA PACING THRESHOLD AMPLITUDE: 0.5 V
MDC IDC MSMT LEADCHNL RA PACING THRESHOLD PULSEWIDTH: 0.4 ms
MDC IDC MSMT LEADCHNL RV PACING THRESHOLD AMPLITUDE: 0.5 V
MDC IDC MSMT LEADCHNL RV PACING THRESHOLD PULSEWIDTH: 0.4 ms
MDC IDC SESS DTM: 20150213153934
MDC IDC SET LEADCHNL RV PACING AMPLITUDE: 0.75 V
MDC IDC SET LEADCHNL RV PACING PULSEWIDTH: 0.4 ms
MDC IDC SET LEADCHNL RV SENSING SENSITIVITY: 2 mV
MDC IDC STAT BRADY RA PERCENT PACED: 5.3 %

## 2013-08-24 NOTE — Progress Notes (Signed)
PPM check in office. 

## 2013-09-12 ENCOUNTER — Encounter: Payer: Self-pay | Admitting: Internal Medicine

## 2013-09-27 DIAGNOSIS — E119 Type 2 diabetes mellitus without complications: Secondary | ICD-10-CM | POA: Diagnosis not present

## 2013-09-27 DIAGNOSIS — I10 Essential (primary) hypertension: Secondary | ICD-10-CM | POA: Diagnosis not present

## 2013-09-27 DIAGNOSIS — E7800 Pure hypercholesterolemia, unspecified: Secondary | ICD-10-CM | POA: Diagnosis not present

## 2013-10-16 DIAGNOSIS — E1149 Type 2 diabetes mellitus with other diabetic neurological complication: Secondary | ICD-10-CM | POA: Diagnosis not present

## 2013-10-16 DIAGNOSIS — B351 Tinea unguium: Secondary | ICD-10-CM | POA: Diagnosis not present

## 2013-10-21 ENCOUNTER — Encounter (HOSPITAL_COMMUNITY): Payer: Self-pay | Admitting: Emergency Medicine

## 2013-10-21 ENCOUNTER — Emergency Department (HOSPITAL_COMMUNITY): Payer: Medicare Other

## 2013-10-21 ENCOUNTER — Emergency Department (HOSPITAL_COMMUNITY)
Admission: EM | Admit: 2013-10-21 | Discharge: 2013-10-21 | Disposition: A | Discharge status: Home / Self Care | Payer: Medicare Other | Attending: Emergency Medicine | Admitting: Emergency Medicine

## 2013-10-21 DIAGNOSIS — R141 Gas pain: Secondary | ICD-10-CM | POA: Diagnosis not present

## 2013-10-21 DIAGNOSIS — B9789 Other viral agents as the cause of diseases classified elsewhere: Secondary | ICD-10-CM | POA: Diagnosis not present

## 2013-10-21 DIAGNOSIS — Z950 Presence of cardiac pacemaker: Secondary | ICD-10-CM | POA: Insufficient documentation

## 2013-10-21 DIAGNOSIS — Z79899 Other long term (current) drug therapy: Secondary | ICD-10-CM | POA: Diagnosis not present

## 2013-10-21 DIAGNOSIS — E119 Type 2 diabetes mellitus without complications: Secondary | ICD-10-CM | POA: Insufficient documentation

## 2013-10-21 DIAGNOSIS — R109 Unspecified abdominal pain: Secondary | ICD-10-CM | POA: Insufficient documentation

## 2013-10-21 DIAGNOSIS — R142 Eructation: Secondary | ICD-10-CM | POA: Diagnosis not present

## 2013-10-21 DIAGNOSIS — E785 Hyperlipidemia, unspecified: Secondary | ICD-10-CM | POA: Diagnosis not present

## 2013-10-21 DIAGNOSIS — I129 Hypertensive chronic kidney disease with stage 1 through stage 4 chronic kidney disease, or unspecified chronic kidney disease: Secondary | ICD-10-CM | POA: Diagnosis not present

## 2013-10-21 DIAGNOSIS — I10 Essential (primary) hypertension: Secondary | ICD-10-CM | POA: Diagnosis not present

## 2013-10-21 DIAGNOSIS — N181 Chronic kidney disease, stage 1: Secondary | ICD-10-CM | POA: Insufficient documentation

## 2013-10-21 DIAGNOSIS — B349 Viral infection, unspecified: Secondary | ICD-10-CM

## 2013-10-21 DIAGNOSIS — R143 Flatulence: Secondary | ICD-10-CM | POA: Diagnosis not present

## 2013-10-21 DIAGNOSIS — Z7982 Long term (current) use of aspirin: Secondary | ICD-10-CM | POA: Insufficient documentation

## 2013-10-21 DIAGNOSIS — Z9889 Other specified postprocedural states: Secondary | ICD-10-CM | POA: Diagnosis not present

## 2013-10-21 LAB — URINALYSIS, ROUTINE W REFLEX MICROSCOPIC
BILIRUBIN URINE: NEGATIVE
GLUCOSE, UA: 100 mg/dL — AB
Hgb urine dipstick: NEGATIVE
KETONES UR: NEGATIVE mg/dL
Leukocytes, UA: NEGATIVE
Nitrite: NEGATIVE
Protein, ur: NEGATIVE mg/dL
Specific Gravity, Urine: 1.02 (ref 1.005–1.030)
Urobilinogen, UA: 1 mg/dL (ref 0.0–1.0)
pH: 6 (ref 5.0–8.0)

## 2013-10-21 LAB — CBG MONITORING, ED: GLUCOSE-CAPILLARY: 115 mg/dL — AB (ref 70–99)

## 2013-10-21 LAB — CBC WITH DIFFERENTIAL/PLATELET
BASOS PCT: 0 % (ref 0–1)
Basophils Absolute: 0 10*3/uL (ref 0.0–0.1)
EOS ABS: 0.2 10*3/uL (ref 0.0–0.7)
Eosinophils Relative: 4 % (ref 0–5)
HCT: 39.9 % (ref 39.0–52.0)
HEMOGLOBIN: 13.2 g/dL (ref 13.0–17.0)
LYMPHS ABS: 0.8 10*3/uL (ref 0.7–4.0)
Lymphocytes Relative: 16 % (ref 12–46)
MCH: 26.3 pg (ref 26.0–34.0)
MCHC: 33.1 g/dL (ref 30.0–36.0)
MCV: 79.5 fL (ref 78.0–100.0)
MONOS PCT: 7 % (ref 3–12)
Monocytes Absolute: 0.3 10*3/uL (ref 0.1–1.0)
NEUTROS ABS: 3.8 10*3/uL (ref 1.7–7.7)
Neutrophils Relative %: 74 % (ref 43–77)
Platelets: 218 10*3/uL (ref 150–400)
RBC: 5.02 MIL/uL (ref 4.22–5.81)
RDW: 14.6 % (ref 11.5–15.5)
WBC: 5.1 10*3/uL (ref 4.0–10.5)

## 2013-10-21 LAB — COMPREHENSIVE METABOLIC PANEL
ALBUMIN: 4.1 g/dL (ref 3.5–5.2)
ALK PHOS: 51 U/L (ref 39–117)
ALT: 23 U/L (ref 0–53)
AST: 27 U/L (ref 0–37)
BILIRUBIN TOTAL: 0.6 mg/dL (ref 0.3–1.2)
BUN: 29 mg/dL — AB (ref 6–23)
CO2: 27 mEq/L (ref 19–32)
Calcium: 9.6 mg/dL (ref 8.4–10.5)
Chloride: 102 mEq/L (ref 96–112)
Creatinine, Ser: 1.59 mg/dL — ABNORMAL HIGH (ref 0.50–1.35)
GFR calc Af Amer: 45 mL/min — ABNORMAL LOW (ref >90)
GFR calc non Af Amer: 39 mL/min — ABNORMAL LOW (ref >90)
Glucose, Bld: 172 mg/dL — ABNORMAL HIGH (ref 70–99)
Potassium: 4.5 mEq/L (ref 3.7–5.3)
SODIUM: 141 meq/L (ref 137–147)
Total Protein: 8 g/dL (ref 6.0–8.3)

## 2013-10-21 LAB — LIPASE, BLOOD: Lipase: 29 U/L (ref 11–59)

## 2013-10-21 NOTE — ED Provider Notes (Signed)
CSN: [id number]     Arrival date & time 10/21/13  2004 History   First MD Initiated Contact with Patient 10/21/13 2031     Chief Complaint  Patient presents with  . Chills  . Abdominal Pain     (Consider location/radiation/quality/duration/timing/severity/associated sxs/prior Treatment) HPI....Marland Kitchen complains of chills and shakiness approximately one hour ago. Abdomen feels full. Symptoms have abated. No chest pain, dyspnea, dysuria, stiff neck.   He feels he is back to normal. Severity is mild. No radiation of pain Past Medical History  Diagnosis Date  . Sinus bradycardia   . First degree AV block   . Second degree AV block   . Hyperlipidemia   . Hypertension   . Diabetes mellitus   . CKD (chronic kidney disease), stage I    Past Surgical History  Procedure Laterality Date  . Umbilical hernia repair  04/2008  . Colonscopy    . Pacemaker insertion    . Colonoscopy    . Hemorrhoid surgery     Family History  Problem Relation Age of Onset  . Heart disease Father   . Cancer Sister   . Cancer Sister   . Cancer Sister   . ALS Sister    History  Substance Use Topics  . Smoking status: Never Smoker   . Smokeless tobacco: Never Used  . Alcohol Use: No    Review of Systems  All other systems reviewed and are negative.     Allergies  Review of patient's allergies indicates no known allergies.  Home Medications   Current Outpatient Rx  Name  Route  Sig  Dispense  Refill  . aspirin 81 MG tablet   Oral   Take 81 mg by mouth every evening.          . docusate sodium (COLACE) 100 MG capsule   Oral   Take 100 mg by mouth daily as needed for constipation.          . fenofibrate 160 MG tablet   Oral   Take 160 mg by mouth daily.         Marland Kitchen glipiZIDE (GLUCOTROL) 5 MG tablet   Oral   Take 5 mg by mouth daily.          Marland Kitchen linagliptin (TRADJENTA) 5 MG TABS tablet   Oral   Take 5 mg by mouth daily.         Marland Kitchen losartan (COZAAR) 100 MG tablet   Oral   Take  100 mg by mouth daily.          . simvastatin (ZOCOR) 20 MG tablet   Oral   Take 20 mg by mouth every evening.          BP 151/103  Pulse 54  Temp(Src) 98.4 F (36.9 C) (Oral)  Resp 20  Ht 6' (1.829 m)  Wt 220 lb (99.791 kg)  BMI 29.83 kg/m2  SpO2 100% Physical Exam  Nursing note and vitals reviewed. Constitutional: He is oriented to person, place, and time. He appears well-developed and well-nourished.  HENT:  Head: Normocephalic and atraumatic.  Eyes: Conjunctivae and EOM are normal. Pupils are equal, round, and reactive to light.  Neck: Normal range of motion. Neck supple.  Cardiovascular: Normal rate, regular rhythm and normal heart sounds.   Pulmonary/Chest: Effort normal and breath sounds normal.  Abdominal: Soft. Bowel sounds are normal.  Musculoskeletal: Normal range of motion.  Neurological: He is alert and oriented to person, place, and time.  Skin: Skin  is warm and dry.  Psychiatric: He has a normal mood and affect. His behavior is normal.    ED Course  Procedures (including critical care time) Labs Review Labs Reviewed  COMPREHENSIVE METABOLIC PANEL - Abnormal; Notable for the following:    Glucose, Bld 172 (*)    BUN 29 (*)    Creatinine, Ser 1.59 (*)    GFR calc non Af Amer 39 (*)    GFR calc Af Amer 45 (*)    All other components within normal limits  URINALYSIS, ROUTINE W REFLEX MICROSCOPIC - Abnormal; Notable for the following:    Glucose, UA 100 (*)    All other components within normal limits  CBG MONITORING, ED - Abnormal; Notable for the following:    Glucose-Capillary 115 (*)    All other components within normal limits  CBC WITH DIFFERENTIAL  LIPASE, BLOOD   Imaging Review Dg Abd Acute W/chest  10/21/2013   CLINICAL DATA:  Abdominal pain and distention.  EXAM: ACUTE ABDOMEN SERIES (ABDOMEN 2 VIEW & CHEST 1 VIEW)  COMPARISON:  DG ABD ACUTE W/CHEST dated 09/15/2012; DG CHEST 2 VIEW dated 02/21/2012  FINDINGS: Stable appearance of dual  chamber pacemaker. Stable pulmonary scarring at both lung bases. No edema or infiltrates.  Abdominal films show no evidence of bowel obstruction. There are some scattered air-fluid levels in the colon which may reflect enteritis. No small bowel dilatation is identified. No evidence of free air. Stable calcifications in the right upper quadrant likely reflect multiple calcified gallstones. Stable degenerative disease of the lumbar spine with associated rightward convex scoliosis.  IMPRESSION: No evidence of bowel obstruction. Some scattered air-fluid levels are identified in the colon. No acute cardiopulmonary disease.   Electronically Signed   By: Aletta Edouard M.D.   On: 10/21/2013 21:30     EKG Interpretation None      MDM   Final diagnoses:  Viral syndrome    Patient appears well. No acute abdomen. Screening test negative. He feels he is back to normal.    Nat Christen, MD 10/21/13 2145

## 2013-10-21 NOTE — Discharge Instructions (Signed)
Tests were good. Followup your primary care Dr.

## 2013-10-21 NOTE — ED Notes (Signed)
Patient complaining of chills and shaking that started about an hour and a half ago. Also reports had diffused abdominal pain with onset of chills. Denies any pain at this time.

## 2013-10-21 NOTE — ED Notes (Signed)
MD at bedside. 

## 2013-10-22 DIAGNOSIS — E119 Type 2 diabetes mellitus without complications: Secondary | ICD-10-CM | POA: Diagnosis not present

## 2013-10-22 DIAGNOSIS — E669 Obesity, unspecified: Secondary | ICD-10-CM | POA: Diagnosis not present

## 2013-10-22 DIAGNOSIS — I10 Essential (primary) hypertension: Secondary | ICD-10-CM | POA: Diagnosis not present

## 2013-10-22 DIAGNOSIS — E7800 Pure hypercholesterolemia, unspecified: Secondary | ICD-10-CM | POA: Diagnosis not present

## 2013-12-02 ENCOUNTER — Emergency Department (HOSPITAL_COMMUNITY)
Admission: EM | Admit: 2013-12-02 | Discharge: 2013-12-03 | Disposition: A | Discharge status: Home / Self Care | Payer: Medicare Other | Attending: Emergency Medicine | Admitting: Emergency Medicine

## 2013-12-02 ENCOUNTER — Encounter (HOSPITAL_COMMUNITY): Payer: Self-pay | Admitting: Emergency Medicine

## 2013-12-02 ENCOUNTER — Emergency Department (HOSPITAL_COMMUNITY): Payer: Medicare Other

## 2013-12-02 DIAGNOSIS — E119 Type 2 diabetes mellitus without complications: Secondary | ICD-10-CM | POA: Diagnosis not present

## 2013-12-02 DIAGNOSIS — Z950 Presence of cardiac pacemaker: Secondary | ICD-10-CM | POA: Diagnosis not present

## 2013-12-02 DIAGNOSIS — Z7982 Long term (current) use of aspirin: Secondary | ICD-10-CM | POA: Insufficient documentation

## 2013-12-02 DIAGNOSIS — N289 Disorder of kidney and ureter, unspecified: Secondary | ICD-10-CM | POA: Insufficient documentation

## 2013-12-02 DIAGNOSIS — Z79899 Other long term (current) drug therapy: Secondary | ICD-10-CM | POA: Diagnosis not present

## 2013-12-02 DIAGNOSIS — J069 Acute upper respiratory infection, unspecified: Secondary | ICD-10-CM

## 2013-12-02 DIAGNOSIS — R000 Tachycardia, unspecified: Secondary | ICD-10-CM | POA: Insufficient documentation

## 2013-12-02 DIAGNOSIS — R609 Edema, unspecified: Secondary | ICD-10-CM | POA: Diagnosis not present

## 2013-12-02 DIAGNOSIS — E785 Hyperlipidemia, unspecified: Secondary | ICD-10-CM | POA: Insufficient documentation

## 2013-12-02 DIAGNOSIS — R0789 Other chest pain: Secondary | ICD-10-CM | POA: Diagnosis not present

## 2013-12-02 DIAGNOSIS — R05 Cough: Secondary | ICD-10-CM | POA: Diagnosis not present

## 2013-12-02 DIAGNOSIS — N181 Chronic kidney disease, stage 1: Secondary | ICD-10-CM | POA: Insufficient documentation

## 2013-12-02 DIAGNOSIS — I10 Essential (primary) hypertension: Secondary | ICD-10-CM | POA: Diagnosis not present

## 2013-12-02 DIAGNOSIS — I129 Hypertensive chronic kidney disease with stage 1 through stage 4 chronic kidney disease, or unspecified chronic kidney disease: Secondary | ICD-10-CM | POA: Diagnosis not present

## 2013-12-02 DIAGNOSIS — R059 Cough, unspecified: Secondary | ICD-10-CM | POA: Diagnosis not present

## 2013-12-02 LAB — COMPREHENSIVE METABOLIC PANEL
ALK PHOS: 48 U/L (ref 39–117)
ALT: 29 U/L (ref 0–53)
AST: 41 U/L — ABNORMAL HIGH (ref 0–37)
Albumin: 4 g/dL (ref 3.5–5.2)
BUN: 27 mg/dL — ABNORMAL HIGH (ref 6–23)
CO2: 24 mEq/L (ref 19–32)
CREATININE: 1.85 mg/dL — AB (ref 0.50–1.35)
Calcium: 9.5 mg/dL (ref 8.4–10.5)
Chloride: 103 mEq/L (ref 96–112)
GFR calc non Af Amer: 32 mL/min — ABNORMAL LOW (ref >90)
GFR, EST AFRICAN AMERICAN: 37 mL/min — AB (ref >90)
GLUCOSE: 168 mg/dL — AB (ref 70–99)
POTASSIUM: 4.7 meq/L (ref 3.7–5.3)
Sodium: 140 mEq/L (ref 137–147)
TOTAL PROTEIN: 7.5 g/dL (ref 6.0–8.3)
Total Bilirubin: 0.5 mg/dL (ref 0.3–1.2)

## 2013-12-02 LAB — CBC WITH DIFFERENTIAL/PLATELET
Basophils Absolute: 0 10*3/uL (ref 0.0–0.1)
Basophils Relative: 0 % (ref 0–1)
EOS ABS: 0.2 10*3/uL (ref 0.0–0.7)
EOS PCT: 4 % (ref 0–5)
HEMATOCRIT: 36.3 % — AB (ref 39.0–52.0)
HEMOGLOBIN: 11.9 g/dL — AB (ref 13.0–17.0)
Lymphocytes Relative: 15 % (ref 12–46)
Lymphs Abs: 0.9 10*3/uL (ref 0.7–4.0)
MCH: 25.9 pg — AB (ref 26.0–34.0)
MCHC: 32.8 g/dL (ref 30.0–36.0)
MCV: 78.9 fL (ref 78.0–100.0)
MONO ABS: 0.3 10*3/uL (ref 0.1–1.0)
MONOS PCT: 5 % (ref 3–12)
NEUTROS PCT: 76 % (ref 43–77)
Neutro Abs: 4.5 10*3/uL (ref 1.7–7.7)
Platelets: 204 10*3/uL (ref 150–400)
RBC: 4.6 MIL/uL (ref 4.22–5.81)
RDW: 15 % (ref 11.5–15.5)
WBC: 6 10*3/uL (ref 4.0–10.5)

## 2013-12-02 LAB — PRO B NATRIURETIC PEPTIDE: PRO B NATRI PEPTIDE: 902.6 pg/mL — AB (ref 0–450)

## 2013-12-02 LAB — TROPONIN I: Troponin I: <0.3 ng/mL (ref <0.30)

## 2013-12-02 MED ORDER — SODIUM CHLORIDE 0.9 % IV SOLN
INTRAVENOUS | Status: DC
  Administered 2013-12-02: 23:00:00 via INTRAVENOUS

## 2013-12-02 NOTE — ED Notes (Signed)
Patient assisted in using the urinal.

## 2013-12-02 NOTE — ED Notes (Signed)
Poor historian with multiple complaints. Difficult to understand. Upper resp congestion, feels a tremor in chest, and left elbow hurts sometimes.

## 2013-12-02 NOTE — ED Provider Notes (Signed)
CSN: [id number]     Arrival date & time 12/02/13  2154 History  This chart was scribed for Kyle Acosta, MD by Roxan Diesel, ED scribe.  This patient was seen in room [facility]16A/[facility]A and the patient's care was started at 11:17 PM.   Chief Complaint  Patient presents with  . URI    The history is provided by the patient. No language interpreter was used.    HPI Comments: Kyle Schultz is a 80 y.o. male with h/o second degree AV block, sinus bradycardia, pacemaker placement, CKD, DM, HTN and hyperlipidemia who presents to the Emergency Department complaining of possible URI symptoms that began this morning.  Pt states he was doing well yesterday but on waking up this morning "I had a bad cold."  He characterizes symptoms as productive cough, nasal congestion, "trembling in my chest" and mild "aching" in his left arm.  He denies chest pain, pressure, or squeezing and states his chest discomfort was just an intermittent "trembling."  He states chest trembling and arm discomfort have completely resolved now.  He denies fever to his knowledge.  Pt denies h/o MI.  He takes baby aspirin but is not on blood-thinners.   Past Medical History  Diagnosis Date  . Sinus bradycardia   . First degree AV block   . Second degree AV block   . Hyperlipidemia   . Hypertension   . Diabetes mellitus   . CKD (chronic kidney disease), stage I     Past Surgical History  Procedure Laterality Date  . Umbilical hernia repair  04/2008  . Colonscopy    . Pacemaker insertion    . Colonoscopy    . Hemorrhoid surgery      Family History  Problem Relation Age of Onset  . Heart disease Father   . Cancer Sister   . Cancer Sister   . Cancer Sister   . ALS Sister     History  Substance Use Topics  . Smoking status: Never Smoker   . Smokeless tobacco: Never Used  . Alcohol Use: No     Review of Systems  All other systems reviewed and are negative.     Allergies  Review of patient's allergies  indicates no known allergies.  Home Medications   Prior to Admission medications   Medication Sig Start Date End Date Taking? Authorizing Provider  aspirin 81 MG tablet Take 81 mg by mouth every evening.     Historical Provider, MD  docusate sodium (COLACE) 100 MG capsule Take 100 mg by mouth daily as needed for constipation.  10/22/12   Sharyon Cable, MD  fenofibrate 160 MG tablet Take 160 mg by mouth daily.    Historical Provider, MD  glipiZIDE (GLUCOTROL) 5 MG tablet Take 10 mg by mouth daily.  02/13/11   Historical Provider, MD  linagliptin (TRADJENTA) 5 MG TABS tablet Take 5 mg by mouth daily.    Historical Provider, MD  losartan (COZAAR) 100 MG tablet Take 100 mg by mouth daily.  02/13/11   Historical Provider, MD  simvastatin (ZOCOR) 20 MG tablet Take 20 mg by mouth every evening.    Historical Provider, MD   BP 102/53  Pulse 57  Temp(Src) 99.9 F (37.7 C) (Oral)  Resp 24  Ht 6' (1.829 m)  Wt 220 lb (99.791 kg)  BMI 29.83 kg/m2  SpO2 96%  Physical Exam  Nursing note and vitals reviewed. Constitutional: He appears well-developed and well-nourished. No distress.  HENT:  Head:  Normocephalic and atraumatic.  Mouth/Throat: Uvula is midline, oropharynx is clear and moist and mucous membranes are normal. No oropharyngeal exudate, posterior oropharyngeal edema or posterior oropharyngeal erythema.  Copious amounts of clear and purulent material in bilateral nares No sinus tenderness  Eyes: Conjunctivae and EOM are normal. Pupils are equal, round, and reactive to light. Right eye exhibits no discharge. Left eye exhibits no discharge. No scleral icterus.  Neck: Normal range of motion. Neck supple. No JVD present. No thyromegaly present.  Very supple neck  Cardiovascular: Regular rhythm, normal heart sounds and intact distal pulses.  Tachycardia present.  Exam reveals no gallop and no friction rub.   No murmur heard. Mild tachycardia Strong pulses of the feet  Pulmonary/Chest: Effort  normal and breath sounds normal. No respiratory distress. He has no wheezes. He has no rales.  Abdominal: Soft. Bowel sounds are normal. He exhibits no distension and no mass. There is no tenderness.  Musculoskeletal: Normal range of motion. He exhibits edema. He exhibits no tenderness.  Bilateral pretibial pitting edema  Lymphadenopathy:    He has no cervical adenopathy.  Neurological: He is alert. Coordination normal.  Skin: Skin is warm and dry. No rash noted. No erythema.  Psychiatric: He has a normal mood and affect. His behavior is normal.    ED Course  Procedures (including critical care time)  DIAGNOSTIC STUDIES: Oxygen Saturation is 96% on room air, normal by my interpretation.    COORDINATION OF CARE: 11:24 PM-Discussed treatment plan which includes EKG, CXR and labs with pt at bedside and pt agreed to plan.     Labs Review Labs Reviewed  CBC WITH DIFFERENTIAL - Abnormal; Notable for the following:    Hemoglobin 11.9 (*)    HCT 36.3 (*)    MCH 25.9 (*)    All other components within normal limits  COMPREHENSIVE METABOLIC PANEL - Abnormal; Notable for the following:    Glucose, Bld 168 (*)    BUN 27 (*)    Creatinine, Ser 1.85 (*)    AST 41 (*)    GFR calc non Af Amer 32 (*)    GFR calc Af Amer 37 (*)    All other components within normal limits  PRO B NATRIURETIC PEPTIDE - Abnormal; Notable for the following:    Pro B Natriuretic peptide (BNP) 902.6 (*)    All other components within normal limits  TROPONIN I    Imaging Review Dg Chest 2 View  12/02/2013   CLINICAL DATA:  Productive cough, nasal congestion.  EXAM: CHEST  2 VIEW  COMPARISON:  DG ABD ACUTE W/CHEST dated 10/21/2013  FINDINGS: The cardiac silhouette is mildly enlarged, tortuous, calcified aorta. Mediastinal silhouette is nonsuspicious. Mild, slightly increased central pulmonary vasculature congestion, and mild interstitial prominence. Similar bibasilar scarring. Right lung base calcified granuloma.  No pleural effusions. Slightly increased lung volumes. No pneumothorax.  Dual lead left cardiac pacemaker in situ. Soft tissue planes and included osseous structures are nonsuspicious.  IMPRESSION: Mild cardiomegaly, increasing interstitial prominence may reflect pulmonary edema in a background of suspected COPD.   Electronically Signed   By: Elon Alas   On: 12/02/2013 23:46     EKG Interpretation   Date/Time:  Sunday Dec 02 2013 22:31:15 EDT Ventricular Rate:  109 PR Interval:  199 QRS Duration: 139 QT Interval:  382 QTC Calculation: 514 R Axis:   -61 Text Interpretation:  Sinus or ectopic atrial tachycardia Left atrial  enlargement Nonspecific IVCD with LAD Lateral infarct, recent Probable  anteroseptal infarct, recent Since last tracing lateral q waves now  present Abnormal ekg Confirmed by Hiroshi Krummel  MD, Satoria Dunlop ([id number]) on 12/02/2013  11:27:02 PM      MDM   Final diagnoses:  URI (upper respiratory infection)  Renal insufficiency    The patient has slight renal insufficiency compared with baseline, this is not terribly different and thus requires no acute interventions. His chest x-ray does show possible interstitial edema, but his peripheral edema and a BNP of 900 I suspect he has mild congestive heart failure which would not be out of the realm of possibility given his age and comorbidities including the need for pacemaker due to symptomatic bradycardia. His vital signs reflect no fever, normal blood pressure, oxygenation of 96% without any shortness of breath or any abnormal findings on his clinical pulmonary exam. He will be treated in the outpatient setting with a small amount of Lasix to help with diuresis the small amount of fluid that he is retaining but not enough to cause further renal injury. He will also be given Zithromax. I encouraged him to follow up with his family doctor for repeat kidney testing as well as his cardiologist for possible echocardiogram. The patient is  in agreement with the plan.   Meds given in ED:  Medications  0.9 %  sodium chloride infusion ( Intravenous Stopped 12/03/13 0042)    New Prescriptions   AZITHROMYCIN (ZITHROMAX Z-PAK) 250 MG TABLET    Take 1 tablet (250 mg total) by mouth daily. 500mg  PO day 1, then 250mg  PO days 205   FUROSEMIDE (LASIX) 20 MG TABLET    Take 1 tablet (20 mg total) by mouth daily.        I personally performed the services described in this documentation, which was scribed in my presence. The recorded information has been reviewed and is accurate.      Kyle Acosta, MD 12/03/13 [phone]

## 2013-12-03 MED ORDER — FUROSEMIDE 20 MG PO TABS: 20.0000 mg | ORAL_TABLET | Freq: Every day | ORAL | Status: DC

## 2013-12-03 MED ORDER — AZITHROMYCIN 250 MG PO TABS: 250.0000 mg | ORAL_TABLET | Freq: Every day | ORAL | Status: DC

## 2013-12-03 NOTE — Discharge Instructions (Signed)
Your testing today shows that your kidneys are not working as well as they have in the past. Your family doctor needs to recheck this blood test in the office in followup. You must also follow up with your cardiologist. Please take the medication called Zithromax to help with your upper respiratory infection, you should also take the fluid pill called Lasix to help with the increased fluid on your legs.  Please call your doctor for a followup appointment within 24-48 hours. When you talk to your doctor please let them know that you were seen in the emergency department and have them acquire all of your records so that they can discuss the findings with you and formulate a treatment plan to fully care for your new and ongoing problems.

## 2013-12-04 ENCOUNTER — Other Ambulatory Visit (HOSPITAL_COMMUNITY): Payer: Self-pay | Admitting: Internal Medicine

## 2013-12-04 DIAGNOSIS — J069 Acute upper respiratory infection, unspecified: Secondary | ICD-10-CM | POA: Diagnosis not present

## 2013-12-04 DIAGNOSIS — E119 Type 2 diabetes mellitus without complications: Secondary | ICD-10-CM | POA: Diagnosis not present

## 2013-12-04 DIAGNOSIS — N19 Unspecified kidney failure: Secondary | ICD-10-CM

## 2013-12-06 DIAGNOSIS — IMO0001 Reserved for inherently not codable concepts without codable children: Secondary | ICD-10-CM | POA: Diagnosis not present

## 2013-12-07 ENCOUNTER — Ambulatory Visit (HOSPITAL_COMMUNITY)
Admission: RE | Admit: 2013-12-07 | Discharge: 2013-12-07 | Disposition: A | Discharge status: Home / Self Care | Payer: Medicare Other | Source: Ambulatory Visit | Attending: Internal Medicine | Admitting: Internal Medicine

## 2013-12-07 DIAGNOSIS — N281 Cyst of kidney, acquired: Secondary | ICD-10-CM | POA: Diagnosis not present

## 2013-12-07 DIAGNOSIS — N19 Unspecified kidney failure: Secondary | ICD-10-CM | POA: Diagnosis not present

## 2013-12-07 DIAGNOSIS — K7689 Other specified diseases of liver: Secondary | ICD-10-CM | POA: Diagnosis not present

## 2013-12-25 DIAGNOSIS — B351 Tinea unguium: Secondary | ICD-10-CM | POA: Diagnosis not present

## 2013-12-25 DIAGNOSIS — E1149 Type 2 diabetes mellitus with other diabetic neurological complication: Secondary | ICD-10-CM | POA: Diagnosis not present

## 2014-01-05 ENCOUNTER — Encounter (HOSPITAL_COMMUNITY): Payer: Self-pay | Admitting: Emergency Medicine

## 2014-01-05 ENCOUNTER — Emergency Department (HOSPITAL_COMMUNITY)
Admission: EM | Admit: 2014-01-05 | Discharge: 2014-01-05 | Disposition: A | Discharge status: Home / Self Care | Payer: Medicare Other | Attending: Emergency Medicine | Admitting: Emergency Medicine

## 2014-01-05 DIAGNOSIS — Y92410 Unspecified street and highway as the place of occurrence of the external cause: Secondary | ICD-10-CM | POA: Diagnosis not present

## 2014-01-05 DIAGNOSIS — S4980XA Other specified injuries of shoulder and upper arm, unspecified arm, initial encounter: Secondary | ICD-10-CM | POA: Insufficient documentation

## 2014-01-05 DIAGNOSIS — M546 Pain in thoracic spine: Secondary | ICD-10-CM | POA: Diagnosis not present

## 2014-01-05 DIAGNOSIS — Z8709 Personal history of other diseases of the respiratory system: Secondary | ICD-10-CM | POA: Insufficient documentation

## 2014-01-05 DIAGNOSIS — I10 Essential (primary) hypertension: Secondary | ICD-10-CM | POA: Diagnosis not present

## 2014-01-05 DIAGNOSIS — Z7982 Long term (current) use of aspirin: Secondary | ICD-10-CM | POA: Insufficient documentation

## 2014-01-05 DIAGNOSIS — I129 Hypertensive chronic kidney disease with stage 1 through stage 4 chronic kidney disease, or unspecified chronic kidney disease: Secondary | ICD-10-CM | POA: Insufficient documentation

## 2014-01-05 DIAGNOSIS — R6889 Other general symptoms and signs: Secondary | ICD-10-CM | POA: Diagnosis not present

## 2014-01-05 DIAGNOSIS — Z79899 Other long term (current) drug therapy: Secondary | ICD-10-CM | POA: Diagnosis not present

## 2014-01-05 DIAGNOSIS — N181 Chronic kidney disease, stage 1: Secondary | ICD-10-CM | POA: Insufficient documentation

## 2014-01-05 DIAGNOSIS — S46909A Unspecified injury of unspecified muscle, fascia and tendon at shoulder and upper arm level, unspecified arm, initial encounter: Secondary | ICD-10-CM | POA: Diagnosis not present

## 2014-01-05 DIAGNOSIS — E119 Type 2 diabetes mellitus without complications: Secondary | ICD-10-CM | POA: Insufficient documentation

## 2014-01-05 DIAGNOSIS — Z792 Long term (current) use of antibiotics: Secondary | ICD-10-CM | POA: Insufficient documentation

## 2014-01-05 DIAGNOSIS — Y9389 Activity, other specified: Secondary | ICD-10-CM | POA: Insufficient documentation

## 2014-01-05 DIAGNOSIS — E785 Hyperlipidemia, unspecified: Secondary | ICD-10-CM | POA: Insufficient documentation

## 2014-01-05 DIAGNOSIS — Z950 Presence of cardiac pacemaker: Secondary | ICD-10-CM | POA: Insufficient documentation

## 2014-01-05 DIAGNOSIS — M25519 Pain in unspecified shoulder: Secondary | ICD-10-CM | POA: Diagnosis not present
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
## 2014-01-15 ENCOUNTER — Ambulatory Visit (HOSPITAL_COMMUNITY)
Admission: RE | Admit: 2014-01-15 | Discharge: 2014-01-15 | Disposition: A | Discharge status: Home / Self Care | Payer: Medicare Other | Source: Ambulatory Visit | Attending: Internal Medicine | Admitting: Internal Medicine

## 2014-01-15 ENCOUNTER — Other Ambulatory Visit (HOSPITAL_COMMUNITY): Payer: Self-pay | Admitting: Internal Medicine

## 2014-01-15 DIAGNOSIS — M4802 Spinal stenosis, cervical region: Secondary | ICD-10-CM | POA: Diagnosis not present

## 2014-01-15 DIAGNOSIS — M542 Cervicalgia: Secondary | ICD-10-CM

## 2014-01-15 DIAGNOSIS — M5030 Other cervical disc degeneration, unspecified cervical region: Secondary | ICD-10-CM | POA: Insufficient documentation

## 2014-01-15 DIAGNOSIS — E119 Type 2 diabetes mellitus without complications: Secondary | ICD-10-CM | POA: Diagnosis not present

## 2014-01-21 ENCOUNTER — Other Ambulatory Visit (HOSPITAL_COMMUNITY): Payer: Self-pay | Admitting: Internal Medicine

## 2014-01-21 DIAGNOSIS — M542 Cervicalgia: Secondary | ICD-10-CM | POA: Diagnosis not present

## 2014-01-21 DIAGNOSIS — E119 Type 2 diabetes mellitus without complications: Secondary | ICD-10-CM | POA: Diagnosis not present

## 2014-01-21 DIAGNOSIS — M5489 Other dorsalgia: Secondary | ICD-10-CM

## 2014-01-24 ENCOUNTER — Ambulatory Visit (HOSPITAL_COMMUNITY)
Admission: RE | Admit: 2014-01-24 | Discharge: 2014-01-24 | Disposition: A | Discharge status: Home / Self Care | Payer: No Typology Code available for payment source | Source: Ambulatory Visit | Attending: Internal Medicine | Admitting: Internal Medicine

## 2014-01-24 ENCOUNTER — Other Ambulatory Visit (HOSPITAL_COMMUNITY): Payer: Self-pay | Admitting: Internal Medicine

## 2014-01-24 DIAGNOSIS — M545 Low back pain, unspecified: Secondary | ICD-10-CM | POA: Diagnosis present

## 2014-01-24 DIAGNOSIS — N289 Disorder of kidney and ureter, unspecified: Secondary | ICD-10-CM | POA: Insufficient documentation

## 2014-01-24 DIAGNOSIS — M51379 Other intervertebral disc degeneration, lumbosacral region without mention of lumbar back pain or lower extremity pain: Secondary | ICD-10-CM | POA: Insufficient documentation

## 2014-01-24 DIAGNOSIS — M542 Cervicalgia: Secondary | ICD-10-CM

## 2014-01-24 DIAGNOSIS — M48061 Spinal stenosis, lumbar region without neurogenic claudication: Secondary | ICD-10-CM | POA: Diagnosis not present

## 2014-01-24 DIAGNOSIS — M47817 Spondylosis without myelopathy or radiculopathy, lumbosacral region: Secondary | ICD-10-CM | POA: Insufficient documentation

## 2014-01-24 DIAGNOSIS — M5137 Other intervertebral disc degeneration, lumbosacral region: Secondary | ICD-10-CM | POA: Diagnosis not present

## 2014-01-24 DIAGNOSIS — M5489 Other dorsalgia: Secondary | ICD-10-CM

## 2014-02-04 DIAGNOSIS — E119 Type 2 diabetes mellitus without complications: Secondary | ICD-10-CM | POA: Diagnosis not present

## 2014-02-04 DIAGNOSIS — I10 Essential (primary) hypertension: Secondary | ICD-10-CM | POA: Diagnosis not present

## 2014-02-04 DIAGNOSIS — M1990 Unspecified osteoarthritis, unspecified site: Secondary | ICD-10-CM | POA: Diagnosis not present

## 2014-03-05 ENCOUNTER — Encounter: Payer: Self-pay | Admitting: *Deleted

## 2014-03-05 DIAGNOSIS — E1149 Type 2 diabetes mellitus with other diabetic neurological complication: Secondary | ICD-10-CM | POA: Diagnosis not present

## 2014-03-05 DIAGNOSIS — B351 Tinea unguium: Secondary | ICD-10-CM | POA: Diagnosis not present

## 2014-03-08 DIAGNOSIS — E669 Obesity, unspecified: Secondary | ICD-10-CM | POA: Diagnosis not present

## 2014-03-08 DIAGNOSIS — I10 Essential (primary) hypertension: Secondary | ICD-10-CM | POA: Diagnosis not present

## 2014-03-08 DIAGNOSIS — E119 Type 2 diabetes mellitus without complications: Secondary | ICD-10-CM | POA: Diagnosis not present

## 2014-03-08 DIAGNOSIS — Z23 Encounter for immunization: Secondary | ICD-10-CM | POA: Diagnosis not present

## 2014-03-08 DIAGNOSIS — E7800 Pure hypercholesterolemia, unspecified: Secondary | ICD-10-CM | POA: Diagnosis not present

## 2014-03-08 DIAGNOSIS — IMO0001 Reserved for inherently not codable concepts without codable children: Secondary | ICD-10-CM | POA: Diagnosis not present

## 2014-03-10 ENCOUNTER — Emergency Department (HOSPITAL_COMMUNITY): Payer: Medicare Other

## 2014-03-10 ENCOUNTER — Encounter (HOSPITAL_COMMUNITY): Payer: Self-pay | Admitting: Emergency Medicine

## 2014-03-10 ENCOUNTER — Emergency Department (HOSPITAL_COMMUNITY)
Admission: EM | Admit: 2014-03-10 | Discharge: 2014-03-10 | Disposition: A | Discharge status: Home / Self Care | Payer: Medicare Other | Attending: Emergency Medicine | Admitting: Emergency Medicine

## 2014-03-10 DIAGNOSIS — E119 Type 2 diabetes mellitus without complications: Secondary | ICD-10-CM | POA: Diagnosis not present

## 2014-03-10 DIAGNOSIS — I10 Essential (primary) hypertension: Secondary | ICD-10-CM | POA: Diagnosis not present

## 2014-03-10 DIAGNOSIS — R531 Weakness: Secondary | ICD-10-CM

## 2014-03-10 DIAGNOSIS — I129 Hypertensive chronic kidney disease with stage 1 through stage 4 chronic kidney disease, or unspecified chronic kidney disease: Secondary | ICD-10-CM | POA: Diagnosis not present

## 2014-03-10 DIAGNOSIS — R5383 Other fatigue: Principal | ICD-10-CM

## 2014-03-10 DIAGNOSIS — N181 Chronic kidney disease, stage 1: Secondary | ICD-10-CM | POA: Insufficient documentation

## 2014-03-10 DIAGNOSIS — E785 Hyperlipidemia, unspecified: Secondary | ICD-10-CM | POA: Diagnosis not present

## 2014-03-10 DIAGNOSIS — Z7982 Long term (current) use of aspirin: Secondary | ICD-10-CM | POA: Insufficient documentation

## 2014-03-10 DIAGNOSIS — Z79899 Other long term (current) drug therapy: Secondary | ICD-10-CM | POA: Insufficient documentation

## 2014-03-10 DIAGNOSIS — M6281 Muscle weakness (generalized): Secondary | ICD-10-CM | POA: Diagnosis not present

## 2014-03-10 DIAGNOSIS — R5381 Other malaise: Secondary | ICD-10-CM | POA: Diagnosis not present

## 2014-03-10 LAB — BASIC METABOLIC PANEL
Anion gap: 13 (ref 5–15)
BUN: 30 mg/dL — AB (ref 6–23)
CHLORIDE: 97 meq/L (ref 96–112)
CO2: 24 mEq/L (ref 19–32)
Calcium: 9.3 mg/dL (ref 8.4–10.5)
Creatinine, Ser: 1.76 mg/dL — ABNORMAL HIGH (ref 0.50–1.35)
GFR calc non Af Amer: 34 mL/min — ABNORMAL LOW (ref >90)
GFR, EST AFRICAN AMERICAN: 40 mL/min — AB (ref >90)
Glucose, Bld: 186 mg/dL — ABNORMAL HIGH (ref 70–99)
POTASSIUM: 4.6 meq/L (ref 3.7–5.3)
Sodium: 134 mEq/L — ABNORMAL LOW (ref 137–147)

## 2014-03-10 LAB — CBC WITH DIFFERENTIAL/PLATELET
Basophils Absolute: 0 10*3/uL (ref 0.0–0.1)
Basophils Relative: 1 % (ref 0–1)
Eosinophils Absolute: 0.2 10*3/uL (ref 0.0–0.7)
Eosinophils Relative: 5 % (ref 0–5)
HCT: 35.1 % — ABNORMAL LOW (ref 39.0–52.0)
HEMOGLOBIN: 11.5 g/dL — AB (ref 13.0–17.0)
Lymphocytes Relative: 32 % (ref 12–46)
Lymphs Abs: 1.1 10*3/uL (ref 0.7–4.0)
MCH: 25.9 pg — ABNORMAL LOW (ref 26.0–34.0)
MCHC: 32.8 g/dL (ref 30.0–36.0)
MCV: 79.1 fL (ref 78.0–100.0)
MONOS PCT: 6 % (ref 3–12)
Monocytes Absolute: 0.2 10*3/uL (ref 0.1–1.0)
NEUTROS ABS: 2 10*3/uL (ref 1.7–7.7)
Neutrophils Relative %: 56 % (ref 43–77)
Platelets: 194 10*3/uL (ref 150–400)
RBC: 4.44 MIL/uL (ref 4.22–5.81)
RDW: 14.2 % (ref 11.5–15.5)
WBC: 3.6 10*3/uL — AB (ref 4.0–10.5)

## 2014-03-10 LAB — URINALYSIS, ROUTINE W REFLEX MICROSCOPIC
BILIRUBIN URINE: NEGATIVE
GLUCOSE, UA: 100 mg/dL — AB
HGB URINE DIPSTICK: NEGATIVE
KETONES UR: NEGATIVE mg/dL
Leukocytes, UA: NEGATIVE
Nitrite: NEGATIVE
PH: 6.5 (ref 5.0–8.0)
Protein, ur: NEGATIVE mg/dL
Specific Gravity, Urine: <1.005 — ABNORMAL LOW (ref 1.005–1.030)
Urobilinogen, UA: 0.2 mg/dL (ref 0.0–1.0)

## 2014-03-10 LAB — TROPONIN I

## 2014-03-10 NOTE — ED Provider Notes (Signed)
CSN: [id number]     Arrival date & time 03/10/14  1315 History   First MD Initiated Contact with Patient 03/10/14 1342     Chief Complaint  Patient presents with  . Weakness      HPI Pt was seen at 1410. Per pt, c/o gradual onset and persistence of constant generalized weakness for the past 3 to 4 days. Pt states he "felt nervous" today, so he came to the ED "to get checked out." Pt states he "feels better" since arrival to the ED. Pt denies any specific complaints. Denies CP/palpitations, no SOB/cough, no abd pain, no N/V/D, no back pain, no syncope/near syncope, no falls, no focal motor weakness, no tingling/numbness in extremities.    Past Medical History  Diagnosis Date  . Sinus bradycardia   . First degree AV block   . Second degree AV block   . Hyperlipidemia   . Hypertension   . Diabetes mellitus   . CKD (chronic kidney disease), stage I    Past Surgical History  Procedure Laterality Date  . Umbilical hernia repair  04/2008  . Colonscopy    . Pacemaker insertion    . Colonoscopy    . Hemorrhoid surgery     Family History  Problem Relation Age of Onset  . Heart disease Father   . Cancer Sister   . Cancer Sister   . Cancer Sister   . ALS Sister    History  Substance Use Topics  . Smoking status: Never Smoker   . Smokeless tobacco: Never Used  . Alcohol Use: No    Review of Systems ROS: Statement: All systems negative except as marked or noted in the HPI; Constitutional: Negative for fever and chills. +generalized weakness.; ; Eyes: Negative for eye pain, redness and discharge. ; ; ENMT: Negative for ear pain, hoarseness, nasal congestion, sinus pressure and sore throat. ; ; Cardiovascular: Negative for chest pain, palpitations, diaphoresis, dyspnea and peripheral edema. ; ; Respiratory: Negative for cough, wheezing and stridor. ; ; Gastrointestinal: Negative for nausea, vomiting, diarrhea, abdominal pain, blood in stool, hematemesis, jaundice and rectal bleeding. .  ; ; Genitourinary: Negative for dysuria, flank pain and hematuria. ; ; Musculoskeletal: Negative for back pain and neck pain. Negative for swelling and trauma.; ; Skin: Negative for pruritus, rash, abrasions, blisters, bruising and skin lesion.; ; Neuro: Negative for headache, lightheadedness and neck stiffness. Negative for altered level of consciousness , altered mental status, extremity weakness, paresthesias, involuntary movement, seizure and syncope.; Psych:  +anxiety. No SI, no SA, no HI, no hallucinations.     Allergies  Review of patient's allergies indicates no known allergies.  Home Medications   Prior to Admission medications   Medication Sig Start Date End Date Taking? Authorizing Provider  aspirin 81 MG tablet Take 81 mg by mouth every evening.    Yes Historical Provider, MD  docusate sodium (COLACE) 100 MG capsule Take 100 mg by mouth daily as needed for constipation.  10/22/12  Yes Sharyon Cable, MD  fenofibrate 160 MG tablet Take 160 mg by mouth daily.   Yes Historical Provider, MD  glipiZIDE (GLUCOTROL) 5 MG tablet Take 10 mg by mouth daily.  02/13/11  Yes Historical Provider, MD  linagliptin (TRADJENTA) 5 MG TABS tablet Take 5 mg by mouth daily.   Yes Historical Provider, MD  losartan (COZAAR) 100 MG tablet Take 100 mg by mouth daily.  02/13/11  Yes Historical Provider, MD  simvastatin (ZOCOR) 20 MG tablet Take 20 mg  by mouth every evening.   Yes Historical Provider, MD   BP 121/75  Pulse 71  Temp(Src) 98.1 F (36.7 C) (Oral)  Resp 15  Ht 6' (1.829 m)  Wt 220 lb (99.791 kg)  BMI 29.83 kg/m2  SpO2 96%  14:05: Orthostatic Vital Signs Orthostatic Lying - BP- Lying: 142/76 mmHg ; Pulse- Lying: 95  Orthostatic Sitting - BP- Sitting: 140/90 mmHg ; Pulse- Sitting: 106  Orthostatic Standing at 0 minutes - BP- Standing at 0 minutes: 150/82 mmHg ; Pulse- Standing at 0 minutes: 96  Physical Exam 1415: Physical examination:  Nursing notes reviewed; Vital signs and O2 SAT  reviewed;  Constitutional: Well developed, Well nourished, Well hydrated, In no acute distress; Head:  Normocephalic, atraumatic; Eyes: EOMI, PERRL, No scleral icterus; ENMT: Mouth and pharynx normal, Mucous membranes moist; Neck: Supple, Full range of motion, No lymphadenopathy; Cardiovascular: Regular rate and rhythm, No gallop; Respiratory: Breath sounds clear & equal bilaterally, No wheezes.  Speaking full sentences with ease, Normal respiratory effort/excursion; Chest: Nontender, Movement normal; Abdomen: Soft, Nontender, Nondistended, Normal bowel sounds; Genitourinary: No CVA tenderness; Extremities: Pulses normal, No tenderness, No edema, No calf edema or asymmetry.; Neuro: AA&Ox3, Major CN grossly intact. peech clear.  No facial droop.  No nystagmus. Grips equal. Strength 5/5 equal bilat UE's and LE's.  DTR 2/4 equal bilat UE's and LE's.  No gross sensory deficits.  Normal cerebellar testing bilat UE's (finger-nose) and LE's (heel-shin). Climbs on and off stretcher easily by himself. Gait steady.; Skin: Color normal, Warm, Dry.; Psych:  Full affect, calm and cooperative.   ED Course  Procedures     EKG Interpretation None      MDM  MDM Reviewed: previous chart, nursing note and vitals Reviewed previous: labs and ECG Interpretation: labs, ECG and x-ray     Date: 03/10/2014  Rate: 78  Rhythm: normal sinus rhythm  QRS Axis: left  Intervals: PR prolonged  ST/T Wave abnormalities: normal  Conduction Disutrbances:nonspecific intraventricular conduction delay  Narrative Interpretation:   Old EKG Reviewed: changes noted; PR interval increased, otherwise no significant changes since EKG dated 12/02/2013.  Results for orders placed during the hospital encounter of 03/10/14  TROPONIN I      Result Value Ref Range   Troponin I <0.30  <0.30 ng/mL  CBC WITH DIFFERENTIAL      Result Value Ref Range   WBC 3.6 (*) 4.0 - 10.5 K/uL   RBC 4.44  4.22 - 5.81 MIL/uL   Hemoglobin 11.5 (*) 13.0  - 17.0 g/dL   HCT 35.1 (*) 39.0 - 52.0 %   MCV 79.1  78.0 - 100.0 fL   MCH 25.9 (*) 26.0 - 34.0 pg   MCHC 32.8  30.0 - 36.0 g/dL   RDW 14.2  11.5 - 15.5 %   Platelets 194  150 - 400 K/uL   Neutrophils Relative % 56  43 - 77 %   Neutro Abs 2.0  1.7 - 7.7 K/uL   Lymphocytes Relative 32  12 - 46 %   Lymphs Abs 1.1  0.7 - 4.0 K/uL   Monocytes Relative 6  3 - 12 %   Monocytes Absolute 0.2  0.1 - 1.0 K/uL   Eosinophils Relative 5  0 - 5 %   Eosinophils Absolute 0.2  0.0 - 0.7 K/uL   Basophils Relative 1  0 - 1 %   Basophils Absolute 0.0  0.0 - 0.1 K/uL  BASIC METABOLIC PANEL      Result Value  Ref Range   Sodium 134 (*) 137 - 147 mEq/L   Potassium 4.6  3.7 - 5.3 mEq/L   Chloride 97  96 - 112 mEq/L   CO2 24  19 - 32 mEq/L   Glucose, Bld 186 (*) 70 - 99 mg/dL   BUN 30 (*) 6 - 23 mg/dL   Creatinine, Ser 1.76 (*) 0.50 - 1.35 mg/dL   Calcium 9.3  8.4 - 10.5 mg/dL   GFR calc non Af Amer 34 (*) >90 mL/min   GFR calc Af Amer 40 (*) >90 mL/min   Anion gap 13  5 - 15  URINALYSIS, ROUTINE W REFLEX MICROSCOPIC      Result Value Ref Range   Color, Urine YELLOW  YELLOW   APPearance CLEAR  CLEAR   Specific Gravity, Urine <1.005 (*) 1.005 - 1.030   pH 6.5  5.0 - 8.0   Glucose, UA 100 (*) NEGATIVE mg/dL   Hgb urine dipstick NEGATIVE  NEGATIVE   Bilirubin Urine NEGATIVE  NEGATIVE   Ketones, ur NEGATIVE  NEGATIVE mg/dL   Protein, ur NEGATIVE  NEGATIVE mg/dL   Urobilinogen, UA 0.2  0.0 - 1.0 mg/dL   Nitrite NEGATIVE  NEGATIVE   Leukocytes, UA NEGATIVE  NEGATIVE   Dg Chest Portable 1 View 03/10/2014   CLINICAL DATA:  80 year old male with weakness. Lower extremity weakness and presyncope. Initial encounter.  EXAM: PORTABLE CHEST - 1 VIEW  COMPARISON:  12/02/2013 and earlier.  FINDINGS: Portable AP upright view at 1342 hrs. Lower lung volumes. Chronic cardiomegaly. Stable cardiac size and mediastinal contours. Left chest cardiac pacemaker. Visualized tracheal air column is within normal limits. No  pneumothorax. Mild veiling opacity at both lung bases greater on the left. No overt pulmonary edema. No definite consolidation. Chronic calcified granuloma suspected at the right lung base.  IMPRESSION: Low lung volumes with basilar hypoventilation, and difficult to exclude small left effusion. No overt edema.   Electronically Signed   By: Lars Pinks M.D.   On: 03/10/2014 14:18    1555:  Pt is not orthostatic. Pt ambulated with steady gait, easy resps, NAD; Sats remained 97-99% R/A, HR 84-90. Pt denied any complaints while ambulating. H/H and BUN/Cr per baseline. No clear indication for admission at this time. Pt states he would like to go home now. Dx and testing d/w pt and friend.  Questions answered.  Verb understanding, agreeable to d/c home with outpt f/u.        Francine Graven, DO 03/13/14 1719

## 2014-03-10 NOTE — ED Notes (Signed)
Pt tolerated ambulation well with oxygen levels maintaining 97-99% on room air and pulse 84-90.

## 2014-03-10 NOTE — Discharge Instructions (Signed)
°Emergency Department Resource Guide °1) Find a Doctor and Pay Out of Pocket °Although you won't have to find out who is covered by your insurance plan, it is a good idea to ask around and get recommendations. You will then need to call the office and see if the doctor you have chosen will accept you as a new patient and what types of options they offer for patients who are self-pay. Some doctors offer discounts or will set up payment plans for their patients who do not have insurance, but you will need to ask so you aren't surprised when you get to your appointment. ° °2) Contact Your Local Health Department °Not all health departments have doctors that can see patients for sick visits, but many do, so it is worth a call to see if yours does. If you don't know where your local health department is, you can check in your phone book. The CDC also has a tool to help you locate your state's health department, and many state websites also have listings of all of their local health departments. ° °3) Find a Walk-in Clinic °If your illness is not likely to be very severe or complicated, you may want to try a walk in clinic. These are popping up all over the country in pharmacies, drugstores, and shopping centers. They're usually staffed by nurse practitioners or physician assistants that have been trained to treat common illnesses and complaints. They're usually fairly quick and inexpensive. However, if you have serious medical issues or chronic medical problems, these are probably not your best option. ° °No Primary Care Doctor: °- Call [facility] at  [phone] - they can help you locate a primary care doctor that  accepts your insurance, provides certain services, etc. °- Physician Referral Service- 1-800-533-3463 ° °Chronic Pain Problems: °Organization         Address  Phone   Notes  °[facility]  ([phone]) [phone] Patients need to be referred by their primary care doctor.  ° °Medication  Assistance: °Organization         Address  Phone   Notes  °[place] [facility] [address] °[place], [place] [address] (336) 641-8030 --Must be a resident of [place] °-- Must have NO insurance coverage whatsoever (no Medicaid/ Medicare, etc.) °-- The pt. MUST have a primary care doctor that directs their care regularly and follows them in the community °  °[facility]  (866) 331-1348   °[employer]  (888) 892-1162   ° °Agencies that provide inexpensive medical care: °Organization         Address  Phone   Notes  °[facility]  (336) 832-8035   °[facility]    (336) 832-7272   °[facility] [address] °[place], [place] [address] (336) 832-4777   °[facility] [address], °[place] (336) 271-4999   °[facility]    (336) 373-0678   °[facility]    (336) 272-1050   °[facility] ° [address], [place] Phone:  (336) 832-4444, Fax:  (336) 832-4440 Hours of Operation:  9 am - 6 pm, M-F.  Also accepts Medicaid/Medicare and self-pay.  °[facility] ° [address], [place] Phone: (336) [phone], Fax: (336) [phone]. Hours of Operation:  8:30 am - 5:30 pm, M-F.  Also accepts Medicaid and self-pay.  °[facility] [place] [address], [place] Phone: (336) 878-6027   °[facility] [address], [place], [place] (336)723-1848, Ext. 123 Mondays & Thursdays: 7-9 AM.  First 15 patients are seen on a first come, first serve basis. °  ° °Medicaid-accepting [place] Providers: ° °Organization         Address  Phone   Notes  °[facility] [address], [place] (336) [phone] Also accepts self-pay patients.  °[facility] [address], [place] ° (336) [phone]   °[facility] [address], [place]  (336) 288-8857   °[facility] [address] [place] [address], [place] (336) 299-7000   °Veita Bland [address], [place]  ° (336) [phone] Only accepts [place] Access Medicaid patients after they have their name applied to their card.  ° °Self-Pay (no insurance) in [place]: ° °Organization         Address  Phone   Notes  °Sickle Cell Patients, [facility] [address], [place] (336) 832-1970   °[facility] [address], [place] (336) 832-4400   °[facility] ° [address], [place] (336) 992-4800   °[facility]/Dr. Osei-Bonsu ° [address] [place] [address], [place] or [address], [place] (336) [phone] Phone number for both [place] and [place] locations is the same.  °[facility] [address], [place] (336) 299-0000   °[facility] [address] [place] [address], [place] or [address] (336) 852-7530 °(336) 878-2260   °[facility] [address], [place] (336) 350-1642, phone; (336) 294-5005, fax Sees patients 1st and 3rd Saturday of every month.  Must not qualify for public or private insurance (i.e. Medicaid, Medicare, [place] Health Choice, Veterans' Benefits) • Household income should be no more than 200% of the poverty level •The clinic cannot treat you if you are pregnant or think you are pregnant • Sexually transmitted diseases are not treated at the clinic.  ° ° °Dental Care: °Organization         Address  Phone  Notes  °[place] [facility] [address], [place] (336) 641-6152 Accepts children up to age 21 who are enrolled in Medicaid or [place] Health Choice; pregnant women with a Medicaid card; and children who have applied for Medicaid or [place] Health Choice, but were declined, whose parents can pay a reduced fee at time of service.  °[place]  [facility] [place]  [address], [place] (336) 641-7733 Accepts children up to age 21 who are enrolled in Medicaid or [place] Health Choice; pregnant women with a Medicaid card; and children who have applied for Medicaid or [place] Health Choice, but were declined, whose parents can pay a reduced fee at time of service.  °[facility] ° [address], [place] (336) 641-4533 Patients are seen by appointment only. Walk-ins are not accepted. [facility] will see patients 18 years of age and older. °Monday - Tuesday (8am-5pm) °Most Wednesdays (8:30-5pm) °$30 per visit, cash only  °[facility] ° [address], [place] (336) [phone] Patients are seen by appointment only. Walk-ins are not accepted. [facility] will see patients 18 years of age and older. °One   Wednesday Evening (Monthly: Volunteer Based).  $30 per visit, cash only  °[facility]  (919) 537-3737 for adults; Children under age 4, call Graduate Pediatric Dentistry at ([phone]) [phone]. Children aged 4-14, please call (919) [phone] to request a pediatric application. ° Dental services are provided in all areas of dental care including fillings, crowns and bridges, complete and partial dentures, implants, gum treatment, root canals, and extractions. Preventive care is also provided. Treatment is provided to both adults and children. °Patients are selected via a lottery and there is often a waiting list. °  °[facility] [address], °[place] ° (336) 763-8833 [email or web address] °  °[facility] [address], [place], [place] (336)723-1848, Ext. 123 Second and Fourth Thursday of each month, opens at 6:30 AM; Clinic ends at 9 AM.  Patients are seen on a first-come first-served basis, and a limited number are seen during each clinic.  ° °[facility] ° [address], [place], [place] (336) [phone]    Eligibility Requirements °You must have lived in [place], [place], or [place] counties for at least the last three months. °  You cannot be eligible for state or federal sponsored healthcare insurance, including Veterans Administration, Medicaid, or Medicare. °  You generally cannot be eligible for healthcare insurance through your employer.  °  How to apply: °Eligibility screenings are held every Tuesday and Wednesday afternoon from 1:00 pm until 4:00 pm. You do not need an appointment for the interview!  °[facility] [address], [place], [place] 336-631-2330   °[facility]  336-342-8273   °[facility]  336-703-3100   °[facility]  336-570-6415   ° °Behavioral Health Resources in the Community: °Intensive Outpatient Programs °Organization         Address  Phone  Notes  °[facility] [address], [place], [place] 336-878-6098   °[facility] [address], [place], [place] 336-832-9800   °[facility] [address], [place], [place] ° 336-882-2125   °[facility] [address],  °[place], [place] 1-800-853-5163 or 336-641-4981   °Substance Abuse Resources °Organization         Address  Phone  Notes  °[facility]  336-882-2125   °[facility]  336-784-9470   °[facility]  336-285-9073   °[facility]  336-845-3988   °Residential & Outpatient Substance Abuse Program  1-800-659-3381   °Psychological Services °Organization         Address  Phone  Notes  °[facility]  336- 832-9600   °[facility]  336- 378-7881   °[facility] [address], [place] 1-800-853-5163 or 336-641-4981   ° °Mobile Crisis Teams °Organization         Address  Phone  Notes  °[facility], Mobile Crisis Care Unit  1-877-626-1772   °Assertive °[facility] ° [address]  [place], [place] 336-834-9664   °Sharon DeEsch [address] °[place] 336-554-5454   ° °Self-Help/Support Groups °Organization         Address  Phone             Notes  °[facility] - variety of support groups  336- 373-1402 Call for more information  °Narcotics Anonymous (NA), [facility] [address], °[place]  2 meetings at this location  ° °  Residential Treatment Programs °Organization         Address  Phone  Notes  °[facility] [address],    °[place]  1-866-801-8205   °[facility] ° [address], [place], [place] 704-293-8524   °[facility] [address], [place] 336-845-3988 Admissions: 8am-3pm M-F  °[facility] [address]    °[place], [place] 336-841-1104   °[facility] [address], [place], [place] 336-379-7146   °[facility] [address]  °[place], [place] 336-285-9073   °[facility] Intensive Outpatient [address], [place], [place] 336-852-3033   °[facility] [address]  °[place], [place] 1-877-615-2722 or 336-784-9470   °[facility] [address] [place], [place] 336-227-7417 Accepts Medicaid  °[facility] [address]  °[place] 1-800-659-3381 Substance Abuse/Addiction Treatment  ° °[place] Behavioral Health Resources °Organization         Address  Phone  Notes  °[facility]  (888) 581-9988   °Julie Brannon, PhD [address] [place], [place]   (336) 349-5553 or (336) 951-0000   °[facility]   [address] °[place], [place] (336) 349-4454   °[facility] [address], [place], [place] (336) 342-8316 Insurance/Medicaid/sponsorship through [facility]  °[facility] [address]                                    [place], [place] (336) 342-8316 Therapy/tele-psych/case    °[facility] [address]  ° [place], [place] (336) 349-2233    °Dr. Arfeen  (336) 349-4544   °[facility] [place]  [employer] [place] [facility] 1) [address],  °2) [address], [place] °3)  [address], [place] (336) 349-3220 °(336) 342-7768 ° °(336) 342-8140   °[place] Child Abuse [facility] (336) 342-1394 or (336) 342-3537 (After Hours)    ° ° ° °Take your usual prescriptions as previously directed.  Call your regular medical doctor tomorrow to schedule a follow up appointment within the next 2 days. Return to the Emergency Department immediately sooner if worsening.  ° °

## 2014-03-10 NOTE — ED Notes (Signed)
Pt states that he has been having a funny feeling in his chest, feeling anxious, weakness that almost made his fall that started a few days ago, funny feeling is associated with sob, pt also reports that his blood sugar readings have been elevated over the past 3-4 days also, pt alert, denies any pain when asked,

## 2014-03-11 LAB — URINE CULTURE

## 2014-03-22 DIAGNOSIS — I10 Essential (primary) hypertension: Secondary | ICD-10-CM | POA: Diagnosis not present

## 2014-03-22 DIAGNOSIS — E785 Hyperlipidemia, unspecified: Secondary | ICD-10-CM | POA: Diagnosis not present

## 2014-03-22 DIAGNOSIS — Z23 Encounter for immunization: Secondary | ICD-10-CM | POA: Diagnosis not present

## 2014-03-22 DIAGNOSIS — IMO0001 Reserved for inherently not codable concepts without codable children: Secondary | ICD-10-CM | POA: Diagnosis not present

## 2014-05-14 DIAGNOSIS — E1142 Type 2 diabetes mellitus with diabetic polyneuropathy: Secondary | ICD-10-CM | POA: Diagnosis not present

## 2014-05-14 DIAGNOSIS — B351 Tinea unguium: Secondary | ICD-10-CM | POA: Diagnosis not present

## 2014-05-31 ENCOUNTER — Encounter: Payer: Self-pay | Admitting: Internal Medicine

## 2014-05-31 ENCOUNTER — Ambulatory Visit (INDEPENDENT_AMBULATORY_CARE_PROVIDER_SITE_OTHER): Payer: Medicare Other | Admitting: Internal Medicine

## 2014-05-31 VITALS — BP 158/78 | HR 43 | Ht 72.0 in | Wt 220.0 lb

## 2014-05-31 DIAGNOSIS — I495 Sick sinus syndrome: Secondary | ICD-10-CM | POA: Diagnosis not present

## 2014-05-31 DIAGNOSIS — Z950 Presence of cardiac pacemaker: Secondary | ICD-10-CM | POA: Diagnosis not present

## 2014-05-31 DIAGNOSIS — I10 Essential (primary) hypertension: Secondary | ICD-10-CM

## 2014-05-31 LAB — PACEMAKER DEVICE OBSERVATION

## 2014-05-31 LAB — MDC_IDC_ENUM_SESS_TYPE_INCLINIC
Battery Remaining Longevity: 114 mo
Battery Voltage: 2.93 V
Brady Statistic RA Percent Paced: 3.3 %
Brady Statistic RV Percent Paced: 99.35 %
Date Time Interrogation Session: 20151120093751
Lead Channel Impedance Value: 350 Ohm
Lead Channel Pacing Threshold Amplitude: 0.5 V
Lead Channel Pacing Threshold Pulse Width: 0.4 ms
Lead Channel Sensing Intrinsic Amplitude: 12 mV
Lead Channel Sensing Intrinsic Amplitude: 2 mV
Lead Channel Setting Pacing Amplitude: 0.875
Lead Channel Setting Pacing Pulse Width: 0.4 ms
MDC IDC MSMT LEADCHNL RV IMPEDANCE VALUE: 400 Ohm
MDC IDC MSMT LEADCHNL RV PACING THRESHOLD AMPLITUDE: 0.625 V
MDC IDC MSMT LEADCHNL RV PACING THRESHOLD PULSEWIDTH: 0.4 ms
MDC IDC PG SERIAL: 2285839
MDC IDC SET LEADCHNL RA PACING AMPLITUDE: 1.5 V
MDC IDC SET LEADCHNL RV SENSING SENSITIVITY: 2 mV

## 2014-05-31 NOTE — Assessment & Plan Note (Signed)
HIs blood pressure is elevated. He did not take his meds this morning. He will maintain a low sodium diet.

## 2014-05-31 NOTE — Assessment & Plan Note (Signed)
His [employer] DDD PM is working normally. Will recheck in several months. 

## 2014-05-31 NOTE — Assessment & Plan Note (Signed)
He will continue a low fat diet. He will continue his statin therapy.

## 2014-05-31 NOTE — Progress Notes (Signed)
HPI [name] returns today for followup. He is a pleasant 80 yo man with a h/o HTN, symptomatic bradycardia, s/p PPM insertion. In the interim he has done well. He denies chest pain, sob, or syncope. No edema.   No Known Allergies  Current Outpatient Prescriptions  Medication Sig Dispense Refill  . aspirin 81 MG tablet Take 81 mg by mouth every evening.     . docusate sodium (COLACE) 100 MG capsule Take 100 mg by mouth daily as needed for constipation.     . fenofibrate 160 MG tablet Take 160 mg by mouth daily.    Marland Kitchen glipiZIDE (GLUCOTROL) 5 MG tablet Take 10 mg by mouth daily.     Marland Kitchen linagliptin (TRADJENTA) 5 MG TABS tablet Take 5 mg by mouth daily.    Marland Kitchen losartan (COZAAR) 100 MG tablet Take 100 mg by mouth daily.     . simvastatin (ZOCOR) 20 MG tablet Take 20 mg by mouth every evening.     No current facility-administered medications for this visit.     Past Medical History  Diagnosis Date  . Sinus bradycardia   . First degree AV block   . Second degree AV block   . Hyperlipidemia   . Hypertension   . Diabetes mellitus   . CKD (chronic kidney disease), stage I     ROS:   All systems reviewed and negative except as noted in the HPI.   Past Surgical History  Procedure Laterality Date  . Umbilical hernia repair  04/2008  . Colonscopy    . Pacemaker insertion    . Colonoscopy    . Hemorrhoid surgery       Family History  Problem Relation Age of Onset  . Heart disease Father   . Cancer Sister   . Cancer Sister   . Cancer Sister   . ALS Sister      History   Social History  . Marital Status: Widowed    Spouse Name: N/A    Number of Children: N/A  . Years of Education: N/A   Occupational History  . Retired, Optometrist [employer]    Social History Main Topics  . Smoking status: Never Smoker   . Smokeless tobacco: Never Used  . Alcohol Use: No  . Drug Use: No  . Sexual Activity: Not on file   Other Topics Concern  . Not on file   Social History Narrative   Widowed   1 child   Walks daily     BP 158/78 mmHg  Pulse 43  Ht 6' (1.829 m)  Wt 220 lb (99.791 kg)  BMI 29.83 kg/m2  SpO2 98%  Physical Exam:  Well appearing 80 yo man, NAD HEENT: Unremarkable Neck:  No JVD, no thyromegally Lungs:  Clear with no wheezes, rales, or rhonchi HEART:  Regular rate rhythm, no murmurs, no rubs, no clicks Abd:  soft, positive bowel sounds, no organomegally, no rebound, no guarding Ext:  2 plus pulses, no edema, no cyanosis, no clubbing Skin:  No rashes no nodules Neuro:  CN II through XII intact, motor grossly intact   DEVICE  Normal device function.  See PaceArt for details.   Assess/Plan:

## 2014-05-31 NOTE — Patient Instructions (Signed)
Your physician wants you to follow-up in: [place] will receive a reminder letter in the mail two months in advance. If you don't receive a letter, please call our office to schedule the follow-up appointment.  PLEASE SEE PAULA IN 6 MONTHS  Your physician recommends that you continue on your current medications as directed. Please refer to the Current Medication list given to you today.  Thank you for choosing [facility]!!

## 2014-06-21 DIAGNOSIS — E1165 Type 2 diabetes mellitus with hyperglycemia: Secondary | ICD-10-CM | POA: Diagnosis not present

## 2014-06-21 DIAGNOSIS — I10 Essential (primary) hypertension: Secondary | ICD-10-CM | POA: Diagnosis not present

## 2014-06-21 DIAGNOSIS — E663 Overweight: Secondary | ICD-10-CM | POA: Diagnosis not present

## 2014-06-21 DIAGNOSIS — E784 Other hyperlipidemia: Secondary | ICD-10-CM | POA: Diagnosis not present

## 2014-07-29 DIAGNOSIS — E119 Type 2 diabetes mellitus without complications: Secondary | ICD-10-CM | POA: Diagnosis not present

## 2014-07-29 DIAGNOSIS — H2513 Age-related nuclear cataract, bilateral: Secondary | ICD-10-CM | POA: Diagnosis not present

## 2014-07-29 DIAGNOSIS — H538 Other visual disturbances: Secondary | ICD-10-CM | POA: Diagnosis not present

## 2014-07-30 DIAGNOSIS — E1142 Type 2 diabetes mellitus with diabetic polyneuropathy: Secondary | ICD-10-CM | POA: Diagnosis not present

## 2014-07-30 DIAGNOSIS — B351 Tinea unguium: Secondary | ICD-10-CM | POA: Diagnosis not present

## 2014-09-20 DIAGNOSIS — E784 Other hyperlipidemia: Secondary | ICD-10-CM | POA: Diagnosis not present

## 2014-09-20 DIAGNOSIS — E663 Overweight: Secondary | ICD-10-CM | POA: Diagnosis not present

## 2014-09-20 DIAGNOSIS — I10 Essential (primary) hypertension: Secondary | ICD-10-CM | POA: Diagnosis not present

## 2014-09-20 DIAGNOSIS — E1165 Type 2 diabetes mellitus with hyperglycemia: Secondary | ICD-10-CM | POA: Diagnosis not present

## 2014-10-08 DIAGNOSIS — B351 Tinea unguium: Secondary | ICD-10-CM | POA: Diagnosis not present

## 2014-10-08 DIAGNOSIS — L851 Acquired keratosis [keratoderma] palmaris et plantaris: Secondary | ICD-10-CM | POA: Diagnosis not present

## 2014-10-08 DIAGNOSIS — E1142 Type 2 diabetes mellitus with diabetic polyneuropathy: Secondary | ICD-10-CM | POA: Diagnosis not present

## 2014-12-13 ENCOUNTER — Encounter: Payer: Self-pay | Admitting: *Deleted

## 2014-12-17 DIAGNOSIS — E1142 Type 2 diabetes mellitus with diabetic polyneuropathy: Secondary | ICD-10-CM | POA: Diagnosis not present

## 2014-12-17 DIAGNOSIS — B351 Tinea unguium: Secondary | ICD-10-CM | POA: Diagnosis not present

## 2014-12-20 DIAGNOSIS — E784 Other hyperlipidemia: Secondary | ICD-10-CM | POA: Diagnosis not present

## 2014-12-20 DIAGNOSIS — E669 Obesity, unspecified: Secondary | ICD-10-CM | POA: Diagnosis not present

## 2014-12-20 DIAGNOSIS — E119 Type 2 diabetes mellitus without complications: Secondary | ICD-10-CM | POA: Diagnosis not present

## 2014-12-20 DIAGNOSIS — E1165 Type 2 diabetes mellitus with hyperglycemia: Secondary | ICD-10-CM | POA: Diagnosis not present

## 2014-12-20 DIAGNOSIS — I10 Essential (primary) hypertension: Secondary | ICD-10-CM | POA: Diagnosis not present

## 2014-12-20 DIAGNOSIS — E780 Pure hypercholesterolemia: Secondary | ICD-10-CM | POA: Diagnosis not present

## 2015-01-06 ENCOUNTER — Encounter: Payer: Self-pay | Admitting: Internal Medicine

## 2015-01-06 ENCOUNTER — Ambulatory Visit (INDEPENDENT_AMBULATORY_CARE_PROVIDER_SITE_OTHER): Payer: Medicare Other | Admitting: *Deleted

## 2015-01-06 DIAGNOSIS — I495 Sick sinus syndrome: Secondary | ICD-10-CM | POA: Diagnosis not present

## 2015-01-06 LAB — CUP PACEART INCLINIC DEVICE CHECK
Date Time Interrogation Session: 20160627140839
Lead Channel Impedance Value: 380 Ohm
Lead Channel Impedance Value: 460 Ohm
Lead Channel Pacing Threshold Amplitude: 0.5 V
Lead Channel Pacing Threshold Pulse Width: 0.4 ms
Lead Channel Pacing Threshold Pulse Width: 0.4 ms
Lead Channel Setting Pacing Amplitude: 1.5 V
Lead Channel Setting Sensing Sensitivity: 2 mV
MDC IDC MSMT LEADCHNL RA SENSING INTR AMPL: 1.3 mV
MDC IDC MSMT LEADCHNL RV PACING THRESHOLD AMPLITUDE: 0.5 V
MDC IDC MSMT LEADCHNL RV SENSING INTR AMPL: 11 mV
MDC IDC SET LEADCHNL RV PACING AMPLITUDE: 0.875
MDC IDC SET LEADCHNL RV PACING PULSEWIDTH: 0.4 ms
MDC IDC STAT BRADY RA PERCENT PACED: 4 %
MDC IDC STAT BRADY RV PERCENT PACED: 99 %
Pulse Gen Model: 2110
Pulse Gen Serial Number: 2285839

## 2015-01-06 NOTE — Progress Notes (Signed)
Pacemaker check in clinic. Normal device function. Battery longevity 9.3 to 9.7 years. 33 mode switch episodes--longest was 9 minutes 14 seconds. +ASA 81 mg. No changes made. Underlying Sinus w/CHB 38. ROV in 6 mths w/GT in RDS.

## 2015-01-12 ENCOUNTER — Emergency Department (HOSPITAL_COMMUNITY): Payer: Medicare Other

## 2015-01-12 ENCOUNTER — Emergency Department (HOSPITAL_COMMUNITY)
Admission: EM | Admit: 2015-01-12 | Discharge: 2015-01-12 | Disposition: A | Discharge status: Home / Self Care | Payer: Medicare Other | Attending: Emergency Medicine | Admitting: Emergency Medicine

## 2015-01-12 DIAGNOSIS — E785 Hyperlipidemia, unspecified: Secondary | ICD-10-CM | POA: Insufficient documentation

## 2015-01-12 DIAGNOSIS — E119 Type 2 diabetes mellitus without complications: Secondary | ICD-10-CM | POA: Insufficient documentation

## 2015-01-12 DIAGNOSIS — M160 Bilateral primary osteoarthritis of hip: Secondary | ICD-10-CM | POA: Diagnosis not present

## 2015-01-12 DIAGNOSIS — M25552 Pain in left hip: Secondary | ICD-10-CM | POA: Insufficient documentation

## 2015-01-12 DIAGNOSIS — Z950 Presence of cardiac pacemaker: Secondary | ICD-10-CM | POA: Diagnosis not present

## 2015-01-12 DIAGNOSIS — N181 Chronic kidney disease, stage 1: Secondary | ICD-10-CM | POA: Diagnosis not present

## 2015-01-12 DIAGNOSIS — Z79899 Other long term (current) drug therapy: Secondary | ICD-10-CM | POA: Diagnosis not present

## 2015-01-12 DIAGNOSIS — M545 Low back pain, unspecified: Secondary | ICD-10-CM

## 2015-01-12 DIAGNOSIS — I129 Hypertensive chronic kidney disease with stage 1 through stage 4 chronic kidney disease, or unspecified chronic kidney disease: Secondary | ICD-10-CM | POA: Diagnosis not present

## 2015-01-12 DIAGNOSIS — M25551 Pain in right hip: Secondary | ICD-10-CM | POA: Diagnosis not present

## 2015-01-12 DIAGNOSIS — Z7982 Long term (current) use of aspirin: Secondary | ICD-10-CM | POA: Diagnosis not present

## 2015-01-12 LAB — URINALYSIS, ROUTINE W REFLEX MICROSCOPIC
BILIRUBIN URINE: NEGATIVE
GLUCOSE, UA: 250 mg/dL — AB
Hgb urine dipstick: NEGATIVE
Ketones, ur: NEGATIVE mg/dL
Leukocytes, UA: NEGATIVE
NITRITE: NEGATIVE
PROTEIN: NEGATIVE mg/dL
SPECIFIC GRAVITY, URINE: 1.02 (ref 1.005–1.030)
Urobilinogen, UA: 0.2 mg/dL (ref 0.0–1.0)
pH: 5.5 (ref 5.0–8.0)

## 2015-01-12 MED ORDER — NAPROXEN 375 MG PO TABS: 375.0000 mg | ORAL_TABLET | Freq: Two times a day (BID) | ORAL | Status: DC

## 2015-01-12 MED ORDER — NAPROXEN 250 MG PO TABS
500.0000 mg | ORAL_TABLET | Freq: Once | ORAL | Status: AC
  Administered 2015-01-12: 500 mg via ORAL
  Filled 2015-01-12: qty 2

## 2015-01-12 NOTE — ED Provider Notes (Signed)
CSN: [id number]     Arrival date & time 01/12/15  0758 History   First MD Initiated Contact with Patient 01/12/15 0809     Chief Complaint  Patient presents with  . Hip Problem     (Consider location/radiation/quality/duration/timing/severity/associated sxs/prior Treatment) HPI   Kyle Schultz is a 81 y.o. male who presents to the Emergency Department complaining of bilateral hip pain for 1-2 weeks.  He states the pain became worse 3 days ago when he was straining to help his brother into a wheelchair.  He reports pain and "weakness" to both hips and pain across his lower back.  Pain is worse with standing and walking, resolves at rest.  He states it feels "like my hips want to give out when I walk"  He denies abdominal pain, urine or bowel changes, numbness of the lower extremities, fever or chills.  He has not taken any medications for symptom relief.     Past Medical History  Diagnosis Date  . Sinus bradycardia   . First degree AV block   . Second degree AV block   . Hyperlipidemia   . Hypertension   . Diabetes mellitus   . CKD (chronic kidney disease), stage I    Past Surgical History  Procedure Laterality Date  . Umbilical hernia repair  04/2008  . Colonscopy    . Pacemaker insertion    . Colonoscopy    . Hemorrhoid surgery     Family History  Problem Relation Age of Onset  . Heart disease Father   . Cancer Sister   . Cancer Sister   . Cancer Sister   . ALS Sister    History  Substance Use Topics  . Smoking status: Never Smoker   . Smokeless tobacco: Never Used  . Alcohol Use: No    Review of Systems  Constitutional: Negative for fever and chills.  Genitourinary: Negative for dysuria, decreased urine volume and difficulty urinating.  Musculoskeletal: Positive for back pain and arthralgias. Negative for joint swelling, gait problem and neck pain.  Skin: Negative for color change and wound.  All other systems reviewed and are negative.     Allergies   Other  Home Medications   Prior to Admission medications   Medication Sig Start Date End Date Taking? Authorizing Provider  aspirin 81 MG tablet Take 81 mg by mouth every evening.     Historical Provider, MD  docusate sodium (COLACE) 100 MG capsule Take 100 mg by mouth daily as needed for constipation.  10/22/12   Ripley Fraise, MD  fenofibrate 160 MG tablet Take 160 mg by mouth daily.    Historical Provider, MD  glipiZIDE (GLUCOTROL) 5 MG tablet Take 10 mg by mouth daily.  02/13/11   Historical Provider, MD  linagliptin (TRADJENTA) 5 MG TABS tablet Take 5 mg by mouth daily.    Historical Provider, MD  losartan (COZAAR) 100 MG tablet Take 100 mg by mouth daily.  02/13/11   Historical Provider, MD  simvastatin (ZOCOR) 20 MG tablet Take 20 mg by mouth every evening.    Historical Provider, MD   BP 153/69 mmHg  Pulse 90  Temp(Src) 97.4 F (36.3 C)  Resp 18  Ht 6' (1.829 m)  Wt 220 lb (99.791 kg)  BMI 29.83 kg/m2  SpO2 100% Physical Exam  Constitutional: He is oriented to person, place, and time. He appears well-developed and well-nourished. No distress.  HENT:  Head: Normocephalic and atraumatic.  Cardiovascular: Normal rate, regular rhythm, normal heart  sounds and intact distal pulses.   No murmur heard. Pulmonary/Chest: Effort normal and breath sounds normal. No respiratory distress. He exhibits no tenderness.  Abdominal: Soft. He exhibits no distension. There is no tenderness. There is no rebound and no guarding.  Musculoskeletal: Normal range of motion. He exhibits tenderness. He exhibits no edema.       Lumbar back: He exhibits tenderness and pain. He exhibits normal range of motion, no swelling, no deformity, no laceration and normal pulse.  Diffuse ttp of the bilateral lumbar spine and paraspinal muscles.  DP pulses are brisk and symmetrical.  Distal sensation intact.  Hip Flexors/Extensors are intact.  Pt has 5/5 strength against resistance of bilateral lower extremities.  1+  pitting edema of bilateral LE's.   Neurological: He is alert and oriented to person, place, and time. He has normal strength. No sensory deficit. He exhibits normal muscle tone. Coordination and gait normal.  Reflex Scores:      Patellar reflexes are 2+ on the right side and 2+ on the left side.      Achilles reflexes are 2+ on the right side and 2+ on the left side. Skin: Skin is warm and dry. No rash noted.  Psychiatric: He has a normal mood and affect.  Nursing note and vitals reviewed.   ED Course  Procedures (including critical care time) Labs Review Labs Reviewed  URINALYSIS, ROUTINE W REFLEX MICROSCOPIC (NOT AT [facility]) - Abnormal; Notable for the following:    Glucose, UA 250 (*)    All other components within normal limits    Imaging Review Dg Hips Bilat With Pelvis 3-4 Views  01/12/2015   CLINICAL DATA:  Bilateral hip pain, weakness.  EXAM: BILATERAL HIP (WITH PELVIS) 3-4 VIEWS  COMPARISON:  None.  FINDINGS: Mild joint space narrowing in the hips bilaterally. No acute bony abnormality. Specifically, no fracture, subluxation, or dislocation. Soft tissues are intact.  IMPRESSION: No acute bony abnormality.   Electronically Signed   By: Rolm Baptise M.D.   On: 01/12/2015 09:11     EKG Interpretation None      MDM   Final diagnoses:  Hip pain, bilateral  Bilateral low back pain without sciatica    Pt is well appearing, ambulates with a steady gait.  No focal neuro deficits on exam.  Pain is likely musculoskeletal.  Urine is unremarkable.  No concerning sx for infectious process.  Pt agrees to symptomatic tx and close f/u with PMD.  Given return precautions     Kem Parkinson, PA-C 01/12/15 [place], MD 01/12/15 1255

## 2015-01-12 NOTE — ED Notes (Signed)
Pt states, "Feels like my hips want to give out on me." symptoms began a couple of days ago after helping his brother in a wheelchair. Intermittent pain to left side of head/ear but is not hurting today.

## 2015-01-12 NOTE — Discharge Instructions (Signed)
Arthritis, Nonspecific °Arthritis is inflammation of a joint. This usually means pain, redness, warmth or swelling are present. One or more joints may be involved. There are a number of types of arthritis. Your caregiver may not be able to tell what type of arthritis you have right away. °CAUSES  °The most common cause of arthritis is the wear and tear on the joint (osteoarthritis). This causes damage to the cartilage, which can break down over time. The knees, hips, back and neck are most often affected by this type of arthritis. °Other types of arthritis and common causes of joint pain include: °· Sprains and other injuries near the joint. Sometimes minor sprains and injuries cause pain and swelling that develop hours later. °· Rheumatoid arthritis. This affects hands, feet and knees. It usually affects both sides of your body at the same time. It is often associated with chronic ailments, fever, weight loss and general weakness. °· Crystal arthritis. Gout and pseudo gout can cause occasional acute severe pain, redness and swelling in the foot, ankle, or knee. °· Infectious arthritis. Bacteria can get into a joint through a break in overlying skin. This can cause infection of the joint. Bacteria and viruses can also spread through the blood and affect your joints. °· Drug, infectious and allergy reactions. Sometimes joints can become mildly painful and slightly swollen with these types of illnesses. °SYMPTOMS  °· Pain is the main symptom. °· Your joint or joints can also be red, swollen and warm or hot to the touch. °· You may have a fever with certain types of arthritis, or even feel overall ill. °· The joint with arthritis will hurt with movement. Stiffness is present with some types of arthritis. °DIAGNOSIS  °Your caregiver will suspect arthritis based on your description of your symptoms and on your exam. Testing may be needed to find the type of arthritis: °· Blood and sometimes urine tests. °· X-ray tests  and sometimes CT or MRI scans. °· Removal of fluid from the joint (arthrocentesis) is done to check for bacteria, crystals or other causes. Your caregiver (or a specialist) will numb the area over the joint with a local anesthetic, and use a needle to remove joint fluid for examination. This procedure is only minimally uncomfortable. °· Even with these tests, your caregiver may not be able to tell what kind of arthritis you have. Consultation with a specialist (rheumatologist) may be helpful. °TREATMENT  °Your caregiver will discuss with you treatment specific to your type of arthritis. If the specific type cannot be determined, then the following general recommendations may apply. °Treatment of severe joint pain includes: °· Rest. °· Elevation. °· Anti-inflammatory medication (for example, ibuprofen) may be prescribed. Avoiding activities that cause increased pain. °· Only take over-the-counter or prescription medicines for pain and discomfort as recommended by your caregiver. °· Cold packs over an inflamed joint may be used for 10 to 15 minutes every hour. Hot packs sometimes feel better, but do not use overnight. Do not use hot packs if you are diabetic without your caregiver's permission. °· A cortisone shot into arthritic joints may help reduce pain and swelling. °· Any acute arthritis that gets worse over the next 1 to 2 days needs to be looked at to be sure there is no joint infection. °Long-term arthritis treatment involves modifying activities and lifestyle to reduce joint stress jarring. This can include weight loss. Also, exercise is needed to nourish the joint cartilage and remove waste. This helps keep the muscles   around the joint strong. °HOME CARE INSTRUCTIONS  °· Do not take aspirin to relieve pain if gout is suspected. This elevates uric acid levels. °· Only take over-the-counter or prescription medicines for pain, discomfort or fever as directed by your caregiver. °· Rest the joint as much as  possible. °· If your joint is swollen, keep it elevated. °· Use crutches if the painful joint is in your leg. °· Drinking plenty of fluids may help for certain types of arthritis. °· Follow your caregiver's dietary instructions. °· Try low-impact exercise such as: °¨ Swimming. °¨ Water aerobics. °¨ Biking. °¨ Walking. °· Morning stiffness is often relieved by a warm shower. °· Put your joints through regular range-of-motion. °SEEK MEDICAL CARE IF:  °· You do not feel better in 24 hours or are getting worse. °· You have side effects to medications, or are not getting better with treatment. °SEEK IMMEDIATE MEDICAL CARE IF:  °· You have a fever. °· You develop severe joint pain, swelling or redness. °· Many joints are involved and become painful and swollen. °· There is severe back pain and/or leg weakness. °· You have loss of bowel or bladder control. °Document Released: 08/05/2004 Document Revised: 09/20/2011 Document Reviewed: 08/21/2008 °ExitCare® Patient Information ©2015 [employer]. This information is not intended to replace advice given to you by your health care provider. Make sure you discuss any questions you have with your health care provider. ° °

## 2015-02-28 DIAGNOSIS — E1142 Type 2 diabetes mellitus with diabetic polyneuropathy: Secondary | ICD-10-CM | POA: Diagnosis not present

## 2015-02-28 DIAGNOSIS — B351 Tinea unguium: Secondary | ICD-10-CM | POA: Diagnosis not present

## 2015-03-14 DIAGNOSIS — Z23 Encounter for immunization: Secondary | ICD-10-CM | POA: Diagnosis not present

## 2015-03-14 DIAGNOSIS — I10 Essential (primary) hypertension: Secondary | ICD-10-CM | POA: Diagnosis not present

## 2015-03-14 DIAGNOSIS — E784 Other hyperlipidemia: Secondary | ICD-10-CM | POA: Diagnosis not present

## 2015-03-14 DIAGNOSIS — E1165 Type 2 diabetes mellitus with hyperglycemia: Secondary | ICD-10-CM | POA: Diagnosis not present

## 2015-03-14 DIAGNOSIS — E669 Obesity, unspecified: Secondary | ICD-10-CM | POA: Diagnosis not present

## 2015-03-26 ENCOUNTER — Emergency Department (HOSPITAL_COMMUNITY): Payer: Medicare Other

## 2015-03-26 ENCOUNTER — Encounter (HOSPITAL_COMMUNITY): Payer: Self-pay | Admitting: *Deleted

## 2015-03-26 ENCOUNTER — Emergency Department (HOSPITAL_COMMUNITY)
Admission: EM | Admit: 2015-03-26 | Discharge: 2015-03-26 | Disposition: A | Discharge status: Home / Self Care | Payer: Medicare Other | Attending: Emergency Medicine | Admitting: Emergency Medicine

## 2015-03-26 DIAGNOSIS — N181 Chronic kidney disease, stage 1: Secondary | ICD-10-CM | POA: Diagnosis not present

## 2015-03-26 DIAGNOSIS — J40 Bronchitis, not specified as acute or chronic: Secondary | ICD-10-CM | POA: Diagnosis not present

## 2015-03-26 DIAGNOSIS — R531 Weakness: Secondary | ICD-10-CM | POA: Diagnosis not present

## 2015-03-26 DIAGNOSIS — Z7982 Long term (current) use of aspirin: Secondary | ICD-10-CM | POA: Diagnosis not present

## 2015-03-26 DIAGNOSIS — R05 Cough: Secondary | ICD-10-CM | POA: Diagnosis not present

## 2015-03-26 DIAGNOSIS — E119 Type 2 diabetes mellitus without complications: Secondary | ICD-10-CM | POA: Diagnosis not present

## 2015-03-26 DIAGNOSIS — Z79899 Other long term (current) drug therapy: Secondary | ICD-10-CM | POA: Insufficient documentation

## 2015-03-26 DIAGNOSIS — R0981 Nasal congestion: Secondary | ICD-10-CM | POA: Diagnosis present

## 2015-03-26 DIAGNOSIS — J209 Acute bronchitis, unspecified: Secondary | ICD-10-CM | POA: Insufficient documentation

## 2015-03-26 DIAGNOSIS — I129 Hypertensive chronic kidney disease with stage 1 through stage 4 chronic kidney disease, or unspecified chronic kidney disease: Secondary | ICD-10-CM | POA: Diagnosis not present

## 2015-03-26 DIAGNOSIS — E785 Hyperlipidemia, unspecified: Secondary | ICD-10-CM | POA: Insufficient documentation

## 2015-03-26 LAB — BASIC METABOLIC PANEL
ANION GAP: 6 (ref 5–15)
BUN: 34 mg/dL — ABNORMAL HIGH (ref 6–20)
CALCIUM: 9.2 mg/dL (ref 8.9–10.3)
CHLORIDE: 107 mmol/L (ref 101–111)
CO2: 26 mmol/L (ref 22–32)
Creatinine, Ser: 1.52 mg/dL — ABNORMAL HIGH (ref 0.61–1.24)
GFR calc Af Amer: 47 mL/min — ABNORMAL LOW (ref >60)
GFR calc non Af Amer: 41 mL/min — ABNORMAL LOW (ref >60)
GLUCOSE: 188 mg/dL — AB (ref 65–99)
POTASSIUM: 4.1 mmol/L (ref 3.5–5.1)
Sodium: 139 mmol/L (ref 135–145)

## 2015-03-26 LAB — CBC WITH DIFFERENTIAL/PLATELET
BASOS ABS: 0 10*3/uL (ref 0.0–0.1)
Basophils Relative: 1 %
Eosinophils Absolute: 0.3 10*3/uL (ref 0.0–0.7)
Eosinophils Relative: 7 %
HEMATOCRIT: 34.6 % — AB (ref 39.0–52.0)
HEMOGLOBIN: 11.3 g/dL — AB (ref 13.0–17.0)
LYMPHS PCT: 33 %
Lymphs Abs: 1.4 10*3/uL (ref 0.7–4.0)
MCH: 25.9 pg — ABNORMAL LOW (ref 26.0–34.0)
MCHC: 32.7 g/dL (ref 30.0–36.0)
MCV: 79.4 fL (ref 78.0–100.0)
MONO ABS: 0.2 10*3/uL (ref 0.1–1.0)
Monocytes Relative: 5 %
NEUTROS ABS: 2.4 10*3/uL (ref 1.7–7.7)
NEUTROS PCT: 55 %
Platelets: 172 10*3/uL (ref 150–400)
RBC: 4.36 MIL/uL (ref 4.22–5.81)
RDW: 14.8 % (ref 11.5–15.5)
WBC: 4.3 10*3/uL (ref 4.0–10.5)

## 2015-03-26 MED ORDER — AZITHROMYCIN 250 MG PO TABS: ORAL_TABLET | ORAL | Status: DC

## 2015-03-26 NOTE — Discharge Instructions (Signed)
Follow up with your md next week if not improving °

## 2015-03-26 NOTE — ED Notes (Signed)
Pt with bilateral leg weakness this morning around 1130, also c/o chest congestion, pressure to head "like a headache coming on"

## 2015-03-26 NOTE — ED Notes (Signed)
Patient with no complaints at this time. Respirations even and unlabored. Skin warm/dry. Discharge instructions reviewed with patient at this time. Patient given opportunity to voice concerns/ask questions. Patient discharged at this time and left Emergency Department with steady gait.   

## 2015-03-26 NOTE — ED Notes (Signed)
MD at bedside. 

## 2015-03-26 NOTE — ED Provider Notes (Signed)
CSN: [id number]     Arrival date & time 03/26/15  1225 History   First MD Initiated Contact with Patient 03/26/15 1236     Chief Complaint  Patient presents with  . Weakness  . Nasal Congestion     (Consider location/radiation/quality/duration/timing/severity/associated sxs/prior Treatment) Patient is a 81 y.o. male presenting with cough. The history is provided by the patient (Patient complains of cough and nasal congestion for a few days.).  Cough Cough characteristics:  Productive Severity:  Moderate Onset quality:  Sudden Timing:  Constant Progression:  Worsening Chronicity:  New Context: not animal exposure   Associated symptoms: no chest pain, no eye discharge, no headaches and no rash     Past Medical History  Diagnosis Date  . Sinus bradycardia   . First degree AV block   . Second degree AV block   . Hyperlipidemia   . Hypertension   . Diabetes mellitus   . CKD (chronic kidney disease), stage I    Past Surgical History  Procedure Laterality Date  . Umbilical hernia repair  04/2008  . Colonscopy    . Pacemaker insertion    . Colonoscopy    . Hemorrhoid surgery     Family History  Problem Relation Age of Onset  . Heart disease Father   . Cancer Sister   . Cancer Sister   . Cancer Sister   . ALS Sister    Social History  Substance Use Topics  . Smoking status: Never Smoker   . Smokeless tobacco: Never Used  . Alcohol Use: No    Review of Systems  Constitutional: Negative for appetite change and fatigue.  HENT: Negative for congestion, ear discharge and sinus pressure.   Eyes: Negative for discharge.  Respiratory: Positive for cough.   Cardiovascular: Negative for chest pain.  Gastrointestinal: Negative for abdominal pain and diarrhea.  Genitourinary: Negative for frequency and hematuria.  Musculoskeletal: Negative for back pain.  Skin: Negative for rash.  Neurological: Negative for seizures and headaches.  Psychiatric/Behavioral: Negative for  hallucinations.      Allergies  Other  Home Medications   Prior to Admission medications   Medication Sig Start Date End Date Taking? Authorizing Provider  aspirin 81 MG tablet Take 81 mg by mouth every evening.    Yes Historical Provider, MD  docusate sodium (COLACE) 100 MG capsule Take 100 mg by mouth daily as needed for constipation.  10/22/12  Yes Ripley Fraise, MD  fenofibrate 160 MG tablet Take 160 mg by mouth daily.   Yes Historical Provider, MD  glipiZIDE (GLUCOTROL) 5 MG tablet Take 10 mg by mouth daily.  02/13/11  Yes Historical Provider, MD  linagliptin (TRADJENTA) 5 MG TABS tablet Take 5 mg by mouth daily.   Yes Historical Provider, MD  losartan (COZAAR) 100 MG tablet Take 100 mg by mouth daily.  02/13/11  Yes Historical Provider, MD  simvastatin (ZOCOR) 20 MG tablet Take 20 mg by mouth every evening.   Yes Historical Provider, MD  azithromycin (ZITHROMAX Z-PAK) 250 MG tablet 2 po day one, then 1 daily x 4 days 03/26/15   Milton Ferguson, MD  naproxen (NAPROSYN) 375 MG tablet Take 1 tablet (375 mg total) by mouth 2 (two) times daily with a meal. Patient not taking: Reported on 03/26/2015 01/12/15   Tammy Triplett, PA-C   BP 130/78 mmHg  Pulse 67  Temp(Src) 97.4 F (36.3 C) (Oral)  Resp 16  Ht 6' (1.829 m)  Wt 219 lb (99.338 kg)  BMI  29.70 kg/m2  SpO2 99% Physical Exam  Constitutional: He is oriented to person, place, and time. He appears well-developed.  HENT:  Head: Normocephalic.  Eyes: Conjunctivae and EOM are normal. No scleral icterus.  Neck: Neck supple. No thyromegaly present.  Cardiovascular: Normal rate and regular rhythm.  Exam reveals no gallop and no friction rub.   No murmur heard. Pulmonary/Chest: No stridor. He has no wheezes. He has no rales. He exhibits no tenderness.  Abdominal: He exhibits no distension. There is no tenderness. There is no rebound.  Musculoskeletal: Normal range of motion. He exhibits no edema.  Lymphadenopathy:    He has no cervical  adenopathy.  Neurological: He is oriented to person, place, and time. He exhibits normal muscle tone. Coordination normal.  Skin: No rash noted. No erythema.  Psychiatric: He has a normal mood and affect. His behavior is normal.    ED Course  Procedures (including critical care time) Labs Review Labs Reviewed  CBC WITH DIFFERENTIAL/PLATELET - Abnormal; Notable for the following:    Hemoglobin 11.3 (*)    HCT 34.6 (*)    MCH 25.9 (*)    All other components within normal limits  BASIC METABOLIC PANEL - Abnormal; Notable for the following:    Glucose, Bld 188 (*)    BUN 34 (*)    Creatinine, Ser 1.52 (*)    GFR calc non Af Amer 41 (*)    GFR calc Af Amer 47 (*)    All other components within normal limits    Imaging Review Dg Chest 2 View  03/26/2015   CLINICAL DATA:  Chest congestion and cough. Bilateral leg weakness this morning.  EXAM: CHEST  2 VIEW  COMPARISON:  03/10/2014 and 12/02/2013  FINDINGS: Pacemaker in place. Heart size and pulmonary vascularity are normal. Stable 1 cm calcified granuloma at the right lung base posteriorly. No infiltrates or effusions. No acute osseous abnormality. Calcification in the thoracic aorta.  IMPRESSION: No acute abnormality.  Aortic atherosclerosis.   Electronically Signed   By: Lorriane Shire M.D.   On: 03/26/2015 13:26   I have personally reviewed and evaluated these images and lab results as part of my medical decision-making.   EKG Interpretation None      MDM   Final diagnoses:  Bronchitis    Bronchitis,  rx zpak and follow up with pcp next week.    Milton Ferguson, MD 03/26/15 1500

## 2015-05-09 DIAGNOSIS — B351 Tinea unguium: Secondary | ICD-10-CM | POA: Diagnosis not present

## 2015-05-09 DIAGNOSIS — E1142 Type 2 diabetes mellitus with diabetic polyneuropathy: Secondary | ICD-10-CM | POA: Diagnosis not present

## 2015-05-26 ENCOUNTER — Encounter: Payer: Self-pay | Admitting: Internal Medicine

## 2015-05-26 ENCOUNTER — Ambulatory Visit (INDEPENDENT_AMBULATORY_CARE_PROVIDER_SITE_OTHER): Payer: Medicare Other | Admitting: Internal Medicine

## 2015-05-26 VITALS — BP 122/80 | HR 88 | Ht 72.0 in | Wt 214.4 lb

## 2015-05-26 DIAGNOSIS — Z950 Presence of cardiac pacemaker: Secondary | ICD-10-CM

## 2015-05-26 DIAGNOSIS — I10 Essential (primary) hypertension: Secondary | ICD-10-CM | POA: Diagnosis not present

## 2015-05-26 DIAGNOSIS — I495 Sick sinus syndrome: Secondary | ICD-10-CM

## 2015-05-26 LAB — CUP PACEART INCLINIC DEVICE CHECK
Battery Remaining Longevity: 91.2
Battery Voltage: 2.9 V
Brady Statistic RA Percent Paced: 2.9 %
Date Time Interrogation Session: 20161114103233
Implantable Lead Implant Date: 20100715
Implantable Lead Location: 753859
Implantable Lead Location: 753860
Lead Channel Impedance Value: 350 Ohm
Lead Channel Impedance Value: 437.5 Ohm
Lead Channel Pacing Threshold Amplitude: 0.5 V
Lead Channel Pacing Threshold Pulse Width: 0.4 ms
Lead Channel Pacing Threshold Pulse Width: 0.4 ms
Lead Channel Sensing Intrinsic Amplitude: 1.6 mV
Lead Channel Setting Pacing Amplitude: 0.75 V
Lead Channel Setting Pacing Amplitude: 1.5 V
Lead Channel Setting Sensing Sensitivity: 2 mV
MDC IDC LEAD IMPLANT DT: 20100715
MDC IDC MSMT LEADCHNL RV PACING THRESHOLD AMPLITUDE: 0.5 V
MDC IDC MSMT LEADCHNL RV SENSING INTR AMPL: 12 mV
MDC IDC SET LEADCHNL RV PACING PULSEWIDTH: 0.4 ms
MDC IDC STAT BRADY RV PERCENT PACED: 97 %
Pulse Gen Model: 2110
Pulse Gen Serial Number: 2285839

## 2015-05-26 NOTE — Assessment & Plan Note (Signed)
His blood pressure is well controlled. I have asked him to maintain a low sodium diet.

## 2015-05-26 NOTE — Assessment & Plan Note (Signed)
He is asymptomatic, s/p PPM.

## 2015-05-26 NOTE — Assessment & Plan Note (Signed)
His [employer] DDD PM is working normally. Will recheck in several months. 

## 2015-05-26 NOTE — Progress Notes (Signed)
HPI Kyle Schultz returns today for followup. He is a pleasant 81 yo man with a h/o HTN, symptomatic bradycardia, s/p PPM insertion. In the interim he has done well. He denies chest pain, sob, or syncope. No edema. He admits to a sedentary lifestyle. He has not been in the hospital.   Allergies  Allergen Reactions  . Other     "some kind of fluid pill I can't take"     Current Outpatient Prescriptions  Medication Sig Dispense Refill  . aspirin 81 MG tablet Take 81 mg by mouth every evening.     Marland Kitchen azithromycin (ZITHROMAX Z-PAK) 250 MG tablet 2 po day one, then 1 daily x 4 days 5 tablet 0  . docusate sodium (COLACE) 100 MG capsule Take 100 mg by mouth daily as needed for constipation.     . fenofibrate 160 MG tablet Take 160 mg by mouth daily.    Marland Kitchen glipiZIDE (GLUCOTROL) 5 MG tablet Take 10 mg by mouth daily.     Marland Kitchen linagliptin (TRADJENTA) 5 MG TABS tablet Take 5 mg by mouth daily.    Marland Kitchen losartan (COZAAR) 100 MG tablet Take 100 mg by mouth daily.     . naproxen (NAPROSYN) 375 MG tablet Take 1 tablet (375 mg total) by mouth 2 (two) times daily with a meal. 20 tablet 0  . simvastatin (ZOCOR) 20 MG tablet Take 20 mg by mouth every evening.     No current facility-administered medications for this visit.     Past Medical History  Diagnosis Date  . Sinus bradycardia   . First degree AV block   . Second degree AV block   . Hyperlipidemia   . Hypertension   . Diabetes mellitus   . CKD (chronic kidney disease), stage I     ROS:   All systems reviewed and negative except as noted in the HPI.   Past Surgical History  Procedure Laterality Date  . Umbilical hernia repair  04/2008  . Colonscopy    . Pacemaker insertion    . Colonoscopy    . Hemorrhoid surgery       Family History  Problem Relation Age of Onset  . Heart disease Father   . Cancer Sister   . Cancer Sister   . Cancer Sister   . ALS Sister      Social History   Social History  . Marital Status: Widowed    Spouse  Name: N/A  . Number of Children: N/A  . Years of Education: N/A   Occupational History  . Retired, Optometrist [employer]    Social History Main Topics  . Smoking status: Never Smoker   . Smokeless tobacco: Never Used  . Alcohol Use: No  . Drug Use: No  . Sexual Activity: Not on file   Other Topics Concern  . Not on file   Social History Narrative   Widowed   1 child   Walks daily     BP 122/80 mmHg  Pulse 88  Ht 6' (1.829 m)  Wt 214 lb 6.4 oz (97.251 kg)  BMI 29.07 kg/m2  SpO2 99%  Physical Exam:  Well appearing 81 yo man, NAD HEENT: Unremarkable Neck:  No JVD, no thyromegally Lungs:  Clear with no wheezes, rales, or rhonchi HEART:  Regular rate rhythm, no murmurs, no rubs, no clicks Abd:  soft, positive bowel sounds, no organomegally, no rebound, no guarding Ext:  2 plus pulses, no edema, no cyanosis, no clubbing Skin:  No rashes  no nodules Neuro:  CN II through XII intact, motor grossly intact   ECG - NSR with LBBB  DEVICE  Normal device function.  See PaceArt for details.   Assess/Plan:

## 2015-05-26 NOTE — Patient Instructions (Signed)
Your physician wants you to follow-up in: 12 months with Dr. Lovena Le. You will receive a reminder letter in the mail two months in advance. If you don't receive a letter, please call our office to schedule the follow-up appointment.  Your physician recommends that you continue on your current medications as directed. Please refer to the Current Medication list given to you today.  Your physician wants you to follow-up in: [facility].  You will receive a reminder letter in the mail two months in advance. If you don't receive a letter, please call our office to schedule the follow-up appointment.  Thank you for choosing [facility]!

## 2015-06-13 DIAGNOSIS — I10 Essential (primary) hypertension: Secondary | ICD-10-CM | POA: Diagnosis not present

## 2015-06-13 DIAGNOSIS — E1165 Type 2 diabetes mellitus with hyperglycemia: Secondary | ICD-10-CM | POA: Diagnosis not present

## 2015-06-13 DIAGNOSIS — E784 Other hyperlipidemia: Secondary | ICD-10-CM | POA: Diagnosis not present

## 2015-06-18 DIAGNOSIS — I482 Chronic atrial fibrillation: Secondary | ICD-10-CM | POA: Diagnosis not present

## 2015-06-26 ENCOUNTER — Encounter: Payer: Self-pay | Admitting: Cardiology

## 2015-06-26 ENCOUNTER — Ambulatory Visit (INDEPENDENT_AMBULATORY_CARE_PROVIDER_SITE_OTHER): Payer: Medicare Other | Admitting: Cardiology

## 2015-06-26 VITALS — BP 138/78 | HR 83 | Ht 72.0 in | Wt 215.0 lb

## 2015-06-26 DIAGNOSIS — E1121 Type 2 diabetes mellitus with diabetic nephropathy: Secondary | ICD-10-CM

## 2015-06-26 DIAGNOSIS — N189 Chronic kidney disease, unspecified: Secondary | ICD-10-CM

## 2015-06-26 DIAGNOSIS — R002 Palpitations: Secondary | ICD-10-CM

## 2015-06-26 DIAGNOSIS — I10 Essential (primary) hypertension: Secondary | ICD-10-CM

## 2015-06-26 DIAGNOSIS — Z950 Presence of cardiac pacemaker: Secondary | ICD-10-CM

## 2015-06-26 DIAGNOSIS — E785 Hyperlipidemia, unspecified: Secondary | ICD-10-CM

## 2015-06-26 DIAGNOSIS — N183 Chronic kidney disease, stage 3 unspecified: Secondary | ICD-10-CM

## 2015-06-26 NOTE — Assessment & Plan Note (Signed)
Controlled. Normal LVF by echo 2010

## 2015-06-26 NOTE — Patient Instructions (Signed)
Your physician wants you to follow-up in: 6 months with Dr. Lovena Le. You will receive a reminder letter in the mail two months in advance. If you don't receive a letter, please call our office to schedule the follow-up appointment.  Your physician recommends that you continue on your current medications as directed. Please refer to the Current Medication list given to you today.  If you need a refill on your cardiac medications before your next appointment, please call your pharmacy.  Thank you for choosing [facility]!

## 2015-06-26 NOTE — Progress Notes (Signed)
06/26/2015 Kyle Schultz   22-Oct-1929  [id number]  Primary Physician Rosita Fire, MD Primary Cardiologist: Dr Lovena Le  HPI:  81 yo AA man with a h/o HTN, DM, CRI-3, HLD, and symptomatic bradycardia, s/p PPM insertion July 2010. He has been followed by Dr Lovena Le, LOV was Nov 2016 and the pt was doing well. The pt recently saw Dr Legrand Rams and apparently there was some concern he might be in AF. The pt says he has occasional palpitation but no sustained tachycardia. He denies chest pain or SOB.    Current Outpatient Prescriptions  Medication Sig Dispense Refill  . docusate sodium (COLACE) 100 MG capsule Take 100 mg by mouth daily as needed for constipation.     . fenofibrate 160 MG tablet Take 160 mg by mouth daily.    Marland Kitchen glipiZIDE (GLUCOTROL) 5 MG tablet Take 10 mg by mouth daily.     Marland Kitchen linagliptin (TRADJENTA) 5 MG TABS tablet Take 5 mg by mouth daily.    Marland Kitchen losartan (COZAAR) 100 MG tablet Take 100 mg by mouth daily.     . simvastatin (ZOCOR) 20 MG tablet Take 20 mg by mouth every evening.    Marland Kitchen aspirin 81 MG tablet Take 81 mg by mouth every evening.      No current facility-administered medications for this visit.    Allergies  Allergen Reactions  . Other     "some kind of fluid pill I can't take"     Social History   Social History  . Marital Status: Widowed    Spouse Name: N/A  . Number of Children: N/A  . Years of Education: N/A   Occupational History  . Retired, Optometrist Tobacco    Social History Main Topics  . Smoking status: Never Smoker   . Smokeless tobacco: Never Used  . Alcohol Use: No  . Drug Use: No  . Sexual Activity: Not on file   Other Topics Concern  . Not on file   Social History Narrative   Widowed   1 child   Walks daily     Review of Systems: General: negative for chills, fever, night sweats or weight changes.  Cardiovascular: negative for chest pain, dyspnea on exertion, edema, orthopnea, palpitations, paroxysmal nocturnal dyspnea or  shortness of breath Dermatological: negative for rash Respiratory: negative for cough or wheezing Urologic: negative for hematuria Abdominal: negative for nausea, vomiting, diarrhea, bright red blood per rectum, melena, or hematemesis Neurologic: negative for visual changes, syncope, or dizziness All other systems reviewed and are otherwise negative except as noted above.    Blood pressure 138/78, pulse 83, height 6' (1.829 m), weight 215 lb (97.523 kg), SpO2 99 %.  General appearance: alert, cooperative, no distress and mildly obese Neck: no carotid bruit and no JVD Lungs: clear to auscultation bilaterally Heart: regular rate and rhythm Extremities: extremities normal, atraumatic, no cyanosis or edema Skin: Skin color, texture, turgor normal. No rashes or lesions Neurologic: Grossly normal  EKG NSR- tacking A, pacing V, one PVC  ASSESSMENT AND PLAN:   Palpitations Pt referred by Dr Legrand Rams for possible PAF No PAF today on EKG, rare PVC  Cardiac pacemaker in situ St Jude 2010 Just seen by Dr Lovena Le Nov 2016- normal DDD device function  Essential hypertension Controlled. Normal LVF by echo 2010  Dyslipidemia Followed by PCP  Chronic renal insufficiency, stage III (moderate) Followed by PCP, last SCr 1.5  Type 2 diabetes mellitus with renal manifestations, controlled ([place]) Followed by PCP, on oral  agents   PLAN  Same Rx. Reassured pt there was no evidence of atrial fibrillation today and no mention of that at his last pacemaker check. He knows to contact us if he has sustained tachycardia.  F/U 6 months.  Kerin Ransom K PA-C 06/26/2015 1:52 PM

## 2015-06-26 NOTE — Assessment & Plan Note (Signed)
St Jude 2010 Just seen by Dr Lovena Le Nov 2016- normal DDD device function

## 2015-06-26 NOTE — Assessment & Plan Note (Signed)
Pt referred by Dr Legrand Rams for possible PAF No PAF today on EKG, rare PVC

## 2015-06-26 NOTE — Assessment & Plan Note (Signed)
Followed by PCP, on oral agents

## 2015-06-26 NOTE — Assessment & Plan Note (Signed)
Followed by PCP

## 2015-06-26 NOTE — Assessment & Plan Note (Signed)
Followed by PCP, last SCr 1.5

## 2015-07-18 DIAGNOSIS — B351 Tinea unguium: Secondary | ICD-10-CM | POA: Diagnosis not present

## 2015-07-18 DIAGNOSIS — E1142 Type 2 diabetes mellitus with diabetic polyneuropathy: Secondary | ICD-10-CM | POA: Diagnosis not present

## 2015-09-19 DIAGNOSIS — I10 Essential (primary) hypertension: Secondary | ICD-10-CM | POA: Diagnosis not present

## 2015-09-19 DIAGNOSIS — E784 Other hyperlipidemia: Secondary | ICD-10-CM | POA: Diagnosis not present

## 2015-09-19 DIAGNOSIS — E1165 Type 2 diabetes mellitus with hyperglycemia: Secondary | ICD-10-CM | POA: Diagnosis not present

## 2015-09-19 DIAGNOSIS — E119 Type 2 diabetes mellitus without complications: Secondary | ICD-10-CM | POA: Diagnosis not present

## 2015-09-26 DIAGNOSIS — E1142 Type 2 diabetes mellitus with diabetic polyneuropathy: Secondary | ICD-10-CM | POA: Diagnosis not present

## 2015-09-26 DIAGNOSIS — B351 Tinea unguium: Secondary | ICD-10-CM | POA: Diagnosis not present

## 2015-12-05 ENCOUNTER — Ambulatory Visit (INDEPENDENT_AMBULATORY_CARE_PROVIDER_SITE_OTHER): Payer: Medicare Other | Admitting: *Deleted

## 2015-12-05 ENCOUNTER — Encounter: Payer: Self-pay | Admitting: Internal Medicine

## 2015-12-05 DIAGNOSIS — I495 Sick sinus syndrome: Secondary | ICD-10-CM | POA: Diagnosis not present

## 2015-12-05 DIAGNOSIS — B351 Tinea unguium: Secondary | ICD-10-CM | POA: Diagnosis not present

## 2015-12-05 DIAGNOSIS — E1142 Type 2 diabetes mellitus with diabetic polyneuropathy: Secondary | ICD-10-CM | POA: Diagnosis not present

## 2015-12-05 LAB — CUP PACEART INCLINIC DEVICE CHECK
Battery Remaining Longevity: 80.4
Brady Statistic RV Percent Paced: 96 %
Implantable Lead Implant Date: 20100715
Implantable Lead Implant Date: 20100715
Lead Channel Impedance Value: 437.5 Ohm
Lead Channel Pacing Threshold Amplitude: 0.5 V
Lead Channel Pacing Threshold Amplitude: 0.75 V
Lead Channel Pacing Threshold Pulse Width: 0.4 ms
Lead Channel Pacing Threshold Pulse Width: 0.4 ms
Lead Channel Sensing Intrinsic Amplitude: 12 mV
Lead Channel Setting Pacing Amplitude: 0.875
Lead Channel Setting Pacing Amplitude: 1.5 V
Lead Channel Setting Pacing Pulse Width: 0.4 ms
Lead Channel Setting Sensing Sensitivity: 2 mV
MDC IDC LEAD LOCATION: 753859
MDC IDC LEAD LOCATION: 753860
MDC IDC MSMT BATTERY VOLTAGE: 2.89 V
MDC IDC MSMT LEADCHNL RA IMPEDANCE VALUE: 325 Ohm
MDC IDC MSMT LEADCHNL RA PACING THRESHOLD AMPLITUDE: 0.5 V
MDC IDC MSMT LEADCHNL RA PACING THRESHOLD PULSEWIDTH: 0.4 ms
MDC IDC MSMT LEADCHNL RA SENSING INTR AMPL: 1.5 mV
MDC IDC MSMT LEADCHNL RV PACING THRESHOLD AMPLITUDE: 0.75 V
MDC IDC MSMT LEADCHNL RV PACING THRESHOLD PULSEWIDTH: 0.4 ms
MDC IDC SESS DTM: 20170526131858
MDC IDC STAT BRADY RA PERCENT PACED: 3.2 %
Pulse Gen Serial Number: 2285839

## 2015-12-05 NOTE — Progress Notes (Signed)
Pacemaker check in clinic. Normal device function. Thresholds, sensing, impedances consistent with previous measurements. Device programmed to maximize longevity. (26) mode switches (<1%)--max dur. 5 mins 36 sec, Max A 208--AT/AFL. No high ventricular rates noted. Device programmed at appropriate safety margins. Histogram distribution appropriate for patient activity level. Device programmed to optimize intrinsic conduction. Estimated longevity 6.4-6.7 years. Patient will follow up with GT/R as scheduled.

## 2015-12-26 DIAGNOSIS — I10 Essential (primary) hypertension: Secondary | ICD-10-CM | POA: Diagnosis not present

## 2015-12-26 DIAGNOSIS — E784 Other hyperlipidemia: Secondary | ICD-10-CM | POA: Diagnosis not present

## 2015-12-26 DIAGNOSIS — E1165 Type 2 diabetes mellitus with hyperglycemia: Secondary | ICD-10-CM | POA: Diagnosis not present

## 2015-12-29 ENCOUNTER — Encounter: Payer: Self-pay | Admitting: Internal Medicine

## 2015-12-29 ENCOUNTER — Ambulatory Visit (INDEPENDENT_AMBULATORY_CARE_PROVIDER_SITE_OTHER): Payer: Medicare Other | Admitting: Internal Medicine

## 2015-12-29 VITALS — BP 120/74 | HR 81 | Ht 72.0 in | Wt 217.0 lb

## 2015-12-29 DIAGNOSIS — I442 Atrioventricular block, complete: Secondary | ICD-10-CM | POA: Diagnosis not present

## 2015-12-29 LAB — CUP PACEART INCLINIC DEVICE CHECK
Date Time Interrogation Session: 20170619104701
Implantable Lead Implant Date: 20100715
Implantable Lead Implant Date: 20100715
Lead Channel Impedance Value: 350 Ohm
Lead Channel Pacing Threshold Amplitude: 0.75 V
Lead Channel Pacing Threshold Pulse Width: 0.4 ms
Lead Channel Pacing Threshold Pulse Width: 0.4 ms
Lead Channel Pacing Threshold Pulse Width: 0.4 ms
Lead Channel Sensing Intrinsic Amplitude: 1.2 mV
Lead Channel Sensing Intrinsic Amplitude: 12 mV
Lead Channel Setting Pacing Amplitude: 0.75 V
Lead Channel Setting Sensing Sensitivity: 2 mV
MDC IDC LEAD LOCATION: 753859
MDC IDC LEAD LOCATION: 753860
MDC IDC MSMT BATTERY REMAINING LONGEVITY: 80.4
MDC IDC MSMT BATTERY VOLTAGE: 2.89 V
MDC IDC MSMT LEADCHNL RA PACING THRESHOLD AMPLITUDE: 0.75 V
MDC IDC MSMT LEADCHNL RA PACING THRESHOLD PULSEWIDTH: 0.4 ms
MDC IDC MSMT LEADCHNL RV IMPEDANCE VALUE: 450 Ohm
MDC IDC MSMT LEADCHNL RV PACING THRESHOLD AMPLITUDE: 0.5 V
MDC IDC MSMT LEADCHNL RV PACING THRESHOLD AMPLITUDE: 0.5 V
MDC IDC SET LEADCHNL RA PACING AMPLITUDE: 1.5 V
MDC IDC SET LEADCHNL RV PACING PULSEWIDTH: 0.4 ms
MDC IDC STAT BRADY RA PERCENT PACED: 5.2 %
MDC IDC STAT BRADY RV PERCENT PACED: 96 %
Pulse Gen Model: 2110
Pulse Gen Serial Number: 2285839

## 2015-12-29 NOTE — Progress Notes (Signed)
HPI Kyle Schultz returns today for followup. He is a pleasant 82 yo man with a h/o HTN, symptomatic bradycardia, s/p PPM insertion. In the interim he has done well. He denies chest pain, sob, or syncope. No edema. He admits to a sedentary lifestyle. He has not been in the hospital.   Allergies  Allergen Reactions  . Other     "some kind of fluid pill I can't take"     Current Outpatient Prescriptions  Medication Sig Dispense Refill  . aspirin 81 MG tablet Take 81 mg by mouth every evening.     . docusate sodium (COLACE) 100 MG capsule Take 100 mg by mouth daily as needed for constipation.     . fenofibrate 160 MG tablet Take 160 mg by mouth daily.    Marland Kitchen glipiZIDE (GLUCOTROL) 5 MG tablet Take 10 mg by mouth 2 (two) times daily.     Marland Kitchen linagliptin (TRADJENTA) 5 MG TABS tablet Take 5 mg by mouth daily.    Marland Kitchen losartan (COZAAR) 100 MG tablet Take 100 mg by mouth daily.     . simvastatin (ZOCOR) 20 MG tablet Take 20 mg by mouth every evening.     No current facility-administered medications for this visit.     Past Medical History  Diagnosis Date  . Sinus bradycardia   . First degree AV block   . Second degree AV block   . Hyperlipidemia   . Hypertension   . Diabetes mellitus   . CKD (chronic kidney disease), stage I     ROS:   All systems reviewed and negative except as noted in the HPI.   Past Surgical History  Procedure Laterality Date  . Umbilical hernia repair  04/2008  . Colonscopy    . Pacemaker insertion  [address] jude  . Colonoscopy    . Hemorrhoid surgery       Family History  Problem Relation Age of Onset  . Heart disease Father   . Cancer Sister   . Cancer Sister   . Cancer Sister   . ALS Sister      Social History   Social History  . Marital Status: Widowed    Spouse Name: N/A  . Number of Children: N/A  . Years of Education: N/A   Occupational History  . Retired, Optometrist Tobacco    Social History Main Topics  . Smoking status: Never Smoker    . Smokeless tobacco: Never Used  . Alcohol Use: No  . Drug Use: No  . Sexual Activity: Not on file   Other Topics Concern  . Not on file   Social History Narrative   Widowed   1 child   Walks daily     BP 120/74 mmHg  Pulse 81  Ht 6' (1.829 m)  Wt 217 lb (98.431 kg)  BMI 29.42 kg/m2  SpO2 98%  Physical Exam:  Well appearing 82 yo man, NAD HEENT: Unremarkable Neck:  6 cm JVD, no thyromegally Lungs:  Clear with no wheezes, rales, or rhonchi HEART:  Regular rate rhythm, no murmurs, no rubs, no clicks Abd:  soft, positive bowel sounds, no organomegally, no rebound, no guarding Ext:  2 plus pulses, trace peripheral edema, no cyanosis, no clubbing Skin:  No rashes no nodules Neuro:  CN II through XII intact, motor grossly intact   DEVICE  Normal device function.  See PaceArt for details.   Assess/Plan: 1. PPM - his St[employer] Jude DDD PM is working normally.  Will recheck in several months. 2. HTN - his blood pressure is normal today. He is encouraged to maintain a low sodium diet. 3. Dyslipidemia - he will continue his statin therapy.  Mikle Bosworth.D.

## 2015-12-29 NOTE — Patient Instructions (Signed)
Your physician wants you to follow-up in: 1 Year with Dr. Lovena Le. You will receive a reminder letter in the mail two months in advance. If you don't receive a letter, please call our office to schedule the follow-up appointment.  Your physician recommends that you schedule a follow-up appointment in 6 Months with the [facility].  Your physician recommends that you continue on your current medications as directed. Please refer to the Current Medication list given to you today.  Thank you for choosing [facility]!

## 2016-02-13 DIAGNOSIS — E1142 Type 2 diabetes mellitus with diabetic polyneuropathy: Secondary | ICD-10-CM | POA: Diagnosis not present

## 2016-02-13 DIAGNOSIS — B351 Tinea unguium: Secondary | ICD-10-CM | POA: Diagnosis not present

## 2016-02-20 DIAGNOSIS — B353 Tinea pedis: Secondary | ICD-10-CM | POA: Diagnosis not present

## 2016-02-20 DIAGNOSIS — I10 Essential (primary) hypertension: Secondary | ICD-10-CM | POA: Diagnosis not present

## 2016-02-20 DIAGNOSIS — E784 Other hyperlipidemia: Secondary | ICD-10-CM | POA: Diagnosis not present

## 2016-02-20 DIAGNOSIS — E1165 Type 2 diabetes mellitus with hyperglycemia: Secondary | ICD-10-CM | POA: Diagnosis not present

## 2016-03-03 ENCOUNTER — Emergency Department (HOSPITAL_COMMUNITY)
Admission: EM | Admit: 2016-03-03 | Discharge: 2016-03-03 | Disposition: A | Discharge status: Home / Self Care | Payer: Medicare Other | Attending: Emergency Medicine | Admitting: Emergency Medicine

## 2016-03-03 ENCOUNTER — Emergency Department (HOSPITAL_COMMUNITY): Payer: Medicare Other

## 2016-03-03 ENCOUNTER — Encounter (HOSPITAL_COMMUNITY): Payer: Self-pay | Admitting: Emergency Medicine

## 2016-03-03 DIAGNOSIS — I129 Hypertensive chronic kidney disease with stage 1 through stage 4 chronic kidney disease, or unspecified chronic kidney disease: Secondary | ICD-10-CM | POA: Insufficient documentation

## 2016-03-03 DIAGNOSIS — R0789 Other chest pain: Secondary | ICD-10-CM | POA: Diagnosis not present

## 2016-03-03 DIAGNOSIS — E1122 Type 2 diabetes mellitus with diabetic chronic kidney disease: Secondary | ICD-10-CM | POA: Diagnosis not present

## 2016-03-03 DIAGNOSIS — Z79899 Other long term (current) drug therapy: Secondary | ICD-10-CM | POA: Insufficient documentation

## 2016-03-03 DIAGNOSIS — R079 Chest pain, unspecified: Secondary | ICD-10-CM

## 2016-03-03 DIAGNOSIS — Z7984 Long term (current) use of oral hypoglycemic drugs: Secondary | ICD-10-CM | POA: Diagnosis not present

## 2016-03-03 DIAGNOSIS — Z7982 Long term (current) use of aspirin: Secondary | ICD-10-CM | POA: Diagnosis not present

## 2016-03-03 DIAGNOSIS — R072 Precordial pain: Secondary | ICD-10-CM | POA: Diagnosis not present

## 2016-03-03 DIAGNOSIS — N181 Chronic kidney disease, stage 1: Secondary | ICD-10-CM | POA: Diagnosis not present

## 2016-03-03 LAB — BASIC METABOLIC PANEL
ANION GAP: 3 — AB (ref 5–15)
BUN: 30 mg/dL — ABNORMAL HIGH (ref 6–20)
CALCIUM: 8.7 mg/dL — AB (ref 8.9–10.3)
CHLORIDE: 110 mmol/L (ref 101–111)
CO2: 24 mmol/L (ref 22–32)
Creatinine, Ser: 1.52 mg/dL — ABNORMAL HIGH (ref 0.61–1.24)
GFR calc Af Amer: 47 mL/min — ABNORMAL LOW (ref >60)
GFR calc non Af Amer: 40 mL/min — ABNORMAL LOW (ref >60)
GLUCOSE: 172 mg/dL — AB (ref 65–99)
Potassium: 4 mmol/L (ref 3.5–5.1)
Sodium: 137 mmol/L (ref 135–145)

## 2016-03-03 LAB — TROPONIN I
Troponin I: 0.04 ng/mL (ref <0.03)
Troponin I: 0.04 ng/mL (ref <0.03)

## 2016-03-03 LAB — CBC
HEMATOCRIT: 36.1 % — AB (ref 39.0–52.0)
HEMOGLOBIN: 11.4 g/dL — AB (ref 13.0–17.0)
MCH: 25.3 pg — ABNORMAL LOW (ref 26.0–34.0)
MCHC: 31.6 g/dL (ref 30.0–36.0)
MCV: 80.2 fL (ref 78.0–100.0)
Platelets: 211 10*3/uL (ref 150–400)
RBC: 4.5 MIL/uL (ref 4.22–5.81)
RDW: 14.7 % (ref 11.5–15.5)
WBC: 4.3 10*3/uL (ref 4.0–10.5)

## 2016-03-03 LAB — I-STAT TROPONIN, ED: Troponin i, poc: 0.02 ng/mL (ref 0.00–0.08)

## 2016-03-03 MED ORDER — ASPIRIN 81 MG PO CHEW: 324.0000 mg | CHEWABLE_TABLET | Freq: Once | ORAL | Status: DC

## 2016-03-03 NOTE — ED Provider Notes (Signed)
[facility] DEPT Provider Note   CSN: [id number] Arrival date & time: 03/03/16  1435     History   Chief Complaint Chief Complaint  Patient presents with  . Chest Pain    HPI Kyle Schultz is a 82 y.o. male.  Patient with onset of chest pain at about 1 this afternoon. Last for 30 minutes to resolve. Patient described it as substernal chest pressure. Radiated towards the left shoulder not associated with shortness of breath. Not associated with nausea or vomiting. Pain resolved prior to arrival without any specific treatment. Is not reoccurred. Patient does have a history of a pacemaker has a cardiac risk factors to include high blood pressure hyperlipidemia and diabetes. As well as a history of second-degree AV block that's why he has the pacemaker.      Past Medical History:  Diagnosis Date  . CKD (chronic kidney disease), stage I   . Diabetes mellitus   . First degree AV block   . Hyperlipidemia   . Hypertension   . Second degree AV block   . Sinus bradycardia     Patient Active Problem List   Diagnosis Date Noted  . Chronic renal insufficiency, stage III (moderate) 06/26/2015  . Palpitations 06/26/2015  . SINOATRIAL NODE DYSFUNCTION 08/01/2009  . Cardiac pacemaker in situ 08/01/2009  . Type 2 diabetes mellitus with renal manifestations, controlled ([place]) 03/10/2009  . Dyslipidemia 03/10/2009  . Essential hypertension 03/10/2009    Past Surgical History:  Procedure Laterality Date  . COLONOSCOPY    . colonscopy    . HEMORRHOID SURGERY    . PACEMAKER INSERTION  2010   [address]  . UMBILICAL HERNIA REPAIR  04/2008       Home Medications    Prior to Admission medications   Medication Sig Start Date End Date Taking? Authorizing Provider  aspirin 81 MG tablet Take 81 mg by mouth every evening.    Yes Historical Provider, MD  docusate sodium (COLACE) 100 MG capsule Take 100 mg by mouth daily as needed for constipation.  10/22/12  Yes Ripley Fraise, MD    fenofibrate 160 MG tablet Take 160 mg by mouth daily.   Yes Historical Provider, MD  glipiZIDE (GLUCOTROL) 5 MG tablet Take 10 mg by mouth 2 (two) times daily.  02/13/11  Yes Historical Provider, MD  linagliptin (TRADJENTA) 5 MG TABS tablet Take 5 mg by mouth daily.   Yes Historical Provider, MD  losartan (COZAAR) 100 MG tablet Take 100 mg by mouth daily.  02/13/11  Yes Historical Provider, MD  simvastatin (ZOCOR) 20 MG tablet Take 20 mg by mouth every evening.   Yes Historical Provider, MD    Family History Family History  Problem Relation Age of Onset  . Heart disease Father   . Cancer Sister   . Cancer Sister   . Cancer Sister   . ALS Sister     Social History Social History  Substance Use Topics  . Smoking status: Never Smoker  . Smokeless tobacco: Never Used  . Alcohol use No     Allergies   Other   Review of Systems Review of Systems  Constitutional: Negative for fever.  HENT: Negative for congestion.   Eyes: Negative for visual disturbance.  Respiratory: Negative for shortness of breath.   Cardiovascular: Positive for chest pain. Negative for leg swelling.  Gastrointestinal: Negative for abdominal pain, nausea and vomiting.  Genitourinary: Negative for dysuria.  Musculoskeletal: Negative for back pain.  Skin: Negative for rash.  Neurological: Negative for headaches.  Hematological: Does not bruise/bleed easily.  Psychiatric/Behavioral: Negative for confusion.     Physical Exam Updated Vital Signs BP 144/81   Pulse 74   Temp 97.9 F (36.6 C) (Oral)   Resp 14   Ht 6' (1.829 m)   Wt 95.3 kg   SpO2 99%   BMI 28.48 kg/m   Physical Exam  Constitutional: He is oriented to person, place, and time. He appears well-developed and well-nourished. No distress.  HENT:  Head: Normocephalic and atraumatic.  Eyes: EOM are normal. Pupils are equal, round, and reactive to light.  Neck: Normal range of motion. Neck supple.  Cardiovascular: Normal rate and regular  rhythm.   Pulmonary/Chest: Effort normal and breath sounds normal.  Abdominal: Soft. Bowel sounds are normal.  Musculoskeletal: Normal range of motion.  Neurological: He is alert and oriented to person, place, and time.  Skin: Skin is warm.  Nursing note and vitals reviewed.    ED Treatments / Results  Labs (all labs ordered are listed, but only abnormal results are displayed) Labs Reviewed  CBC - Abnormal; Notable for the following:       Result Value   Hemoglobin 11.4 (*)    HCT 36.1 (*)    MCH 25.3 (*)    All other components within normal limits  BASIC METABOLIC PANEL - Abnormal; Notable for the following:    Glucose, Bld 172 (*)    BUN 30 (*)    Creatinine, Ser 1.52 (*)    Calcium 8.7 (*)    GFR calc non Af Amer 40 (*)    GFR calc Af Amer 47 (*)    Anion gap 3 (*)    All other components within normal limits  TROPONIN I - Abnormal; Notable for the following:    Troponin I 0.04 (*)    All other components within normal limits  TROPONIN I - Abnormal; Notable for the following:    Troponin I 0.04 (*)    All other components within normal limits  CBG MONITORING, ED  I-STAT TROPOININ, ED    EKG  EKG Interpretation  Date/Time:  Wednesday March 03 2016 14:39:03 EDT Ventricular Rate:  76 PR Interval:  188 QRS Duration: 176 QT Interval:  432 QTC Calculation: 486 R Axis:   -56 Text Interpretation:  Atrial-sensed ventricular-paced rhythm with occasional Premature ventricular complexes Abnormal ECG Confirmed by Belle Charlie  MD, Brigette Hopfer ([id number]) on 03/03/2016 3:22:59 PM       Radiology Dg Chest 2 View  Result Date: 03/03/2016 CLINICAL DATA:  Chest pressure and pain radiating down left arm. EXAM: CHEST  2 VIEW COMPARISON:  03/26/2015 FINDINGS: Stable appearance of dual-chamber pacemaker. The heart size is normal. Stable calcified granuloma at the right lung base. There is no evidence of pulmonary edema, consolidation, pneumothorax, nodule or pleural fluid. Bony structures  are unremarkable. IMPRESSION: No active cardiopulmonary disease. Electronically Signed   By: Aletta Edouard M.D.   On: 03/03/2016 15:17    Procedures Procedures (including critical care time)  Medications Ordered in ED Medications - No data to display   Initial Impression / Assessment and Plan / ED Course  I have reviewed the triage vital signs and the nursing notes.  Pertinent labs & imaging results that were available during my care of the patient were reviewed by me and considered in my medical decision making (see chart for details).  Clinical Course  Value Comment By Time  EKG 12-Lead (Reviewed) Minus Liberty, MD 08/23 [phone]  Patient with onset of chest pain is started 1 hour prior arrival resolved in 30 minutes no chest pain now. Did not take anything to resolve the chest pain. Patient described it more as a slight pressure substernal area that went to his left shoulder no shortness of breath no nausea vomiting. Patient with no known cardiac disease. Patient initial troponin was slightly elevated at 0.04. Repeat 3 hours later was still at the same level. Do not feel that this is related to an acute cardiac event. There is been no increase in the troponins. Chest x-ray was negative EKG shows a paced rhythm which a shunt has a known pacemaker. Patient stable for discharge home patient will return for any recurrent chest pain at all. Patient has been chest pain-free throughout his stay here.  Final Clinical Impressions(s) / ED Diagnoses   Final diagnoses:  Chest pain, unspecified chest pain type    New Prescriptions New Prescriptions   No medications on file     Fredia Sorrow, MD 03/03/16 1929

## 2016-03-03 NOTE — ED Triage Notes (Signed)
Pt c/o chest pressure to chest radiating down left arm at times. Dizzy earlier but none now. symptims x 30 min. Denies sweating or n/v. Pt is nondiaphoretic.

## 2016-03-03 NOTE — ED Notes (Signed)
CRITICAL VALUE ALERT  Critical value received:  Troponin  Date of notification: 03/03/16  Time of notification: 1603  Critical value read back: YES  Nurse who received alert:  Lavenia Atlas, RN  MD notified (1st page):  [phone] - Dr. Rogene Houston

## 2016-03-03 NOTE — Discharge Instructions (Signed)
Return for any new or worse symptoms. Follow-up with your regular doctor and cardiology as needed. Return for development of any chest pain that last for 20 minutes or longer. Today's workup was negative.

## 2016-03-04 LAB — CBG MONITORING, ED: GLUCOSE-CAPILLARY: 170 mg/dL — AB (ref 65–99)

## 2016-03-09 ENCOUNTER — Other Ambulatory Visit: Payer: Self-pay

## 2016-04-20 ENCOUNTER — Emergency Department (HOSPITAL_COMMUNITY)
Admission: EM | Admit: 2016-04-20 | Discharge: 2016-04-20 | Disposition: A | Discharge status: Home / Self Care | Payer: Medicare Other | Attending: Emergency Medicine | Admitting: Emergency Medicine

## 2016-04-20 ENCOUNTER — Encounter (HOSPITAL_COMMUNITY): Payer: Self-pay | Admitting: *Deleted

## 2016-04-20 DIAGNOSIS — I129 Hypertensive chronic kidney disease with stage 1 through stage 4 chronic kidney disease, or unspecified chronic kidney disease: Secondary | ICD-10-CM | POA: Diagnosis not present

## 2016-04-20 DIAGNOSIS — R42 Dizziness and giddiness: Secondary | ICD-10-CM | POA: Diagnosis not present

## 2016-04-20 DIAGNOSIS — Z79899 Other long term (current) drug therapy: Secondary | ICD-10-CM | POA: Insufficient documentation

## 2016-04-20 DIAGNOSIS — Z7982 Long term (current) use of aspirin: Secondary | ICD-10-CM | POA: Diagnosis not present

## 2016-04-20 DIAGNOSIS — E1122 Type 2 diabetes mellitus with diabetic chronic kidney disease: Secondary | ICD-10-CM | POA: Diagnosis not present

## 2016-04-20 DIAGNOSIS — N181 Chronic kidney disease, stage 1: Secondary | ICD-10-CM | POA: Diagnosis not present

## 2016-04-20 LAB — CBC WITH DIFFERENTIAL/PLATELET
BASOS ABS: 0 10*3/uL (ref 0.0–0.1)
BASOS PCT: 0 %
EOS ABS: 0.2 10*3/uL (ref 0.0–0.7)
Eosinophils Relative: 5 %
HCT: 33.8 % — ABNORMAL LOW (ref 39.0–52.0)
HEMOGLOBIN: 11 g/dL — AB (ref 13.0–17.0)
Lymphocytes Relative: 36 %
Lymphs Abs: 1.3 10*3/uL (ref 0.7–4.0)
MCH: 25.8 pg — ABNORMAL LOW (ref 26.0–34.0)
MCHC: 32.5 g/dL (ref 30.0–36.0)
MCV: 79.2 fL (ref 78.0–100.0)
MONOS PCT: 7 %
Monocytes Absolute: 0.2 10*3/uL (ref 0.1–1.0)
NEUTROS PCT: 52 %
Neutro Abs: 1.9 10*3/uL (ref 1.7–7.7)
Platelets: 175 10*3/uL (ref 150–400)
RBC: 4.27 MIL/uL (ref 4.22–5.81)
RDW: 14.8 % (ref 11.5–15.5)
WBC: 3.6 10*3/uL — AB (ref 4.0–10.5)

## 2016-04-20 LAB — BASIC METABOLIC PANEL
ANION GAP: 2 — AB (ref 5–15)
BUN: 32 mg/dL — ABNORMAL HIGH (ref 6–20)
CO2: 25 mmol/L (ref 22–32)
CREATININE: 1.59 mg/dL — AB (ref 0.61–1.24)
Calcium: 9 mg/dL (ref 8.9–10.3)
Chloride: 109 mmol/L (ref 101–111)
GFR calc non Af Amer: 38 mL/min — ABNORMAL LOW (ref >60)
GFR, EST AFRICAN AMERICAN: 44 mL/min — AB (ref >60)
Glucose, Bld: 148 mg/dL — ABNORMAL HIGH (ref 65–99)
Potassium: 3.8 mmol/L (ref 3.5–5.1)
SODIUM: 136 mmol/L (ref 135–145)

## 2016-04-20 NOTE — ED Triage Notes (Signed)
Pt states he woke up x 30 mins pta and felt dizzy; no other complaints

## 2016-04-20 NOTE — Discharge Instructions (Signed)
The cause of your dizziness at this time is unknown. It resolved prior to my evaluation. If you have recurrent symptoms or any new or worsening symptoms you need to be reevaluated immediately.

## 2016-04-20 NOTE — ED Notes (Signed)
Orthostatics- pt denies dizziness or any pain while VS taken. He is conversant and speaks in complete sentences without any complaint of discomfort

## 2016-04-20 NOTE — ED Notes (Signed)
Pt ambulated in the hall without difficulty

## 2016-04-20 NOTE — ED Provider Notes (Signed)
[facility] DEPT Provider Note   CSN: [id number] Arrival date & time: 04/20/16  0215     History   Chief Complaint Chief Complaint  Patient presents with  . Dizziness    HPI Kyle Schultz is a 82 y.o. male.  HPI  This is an 16 are old male with history of chronic kidney disease, diabetes, hypertension, hyperlipidemia, pacemaker who presents with dizziness. Patient reports that he woke up at 12:30 AM and felt "swimmy headed."Denies room spinning dizziness. Denies syncope. States that "I've had this a few times before." Denies any weakness, numbness, tingling, speech difficulty. He drove himself here. He states that by the time he got here he felt "normal." Denies chest pain, shortness of breath, abdominal pain, nausea, vomiting, recent illness, fevers. He states that he is at his baseline.  Past Medical History:  Diagnosis Date  . CKD (chronic kidney disease), stage I   . Diabetes mellitus   . First degree AV block   . Hyperlipidemia   . Hypertension   . Second degree AV block   . Sinus bradycardia     Patient Active Problem List   Diagnosis Date Noted  . Chronic renal insufficiency, stage III (moderate) 06/26/2015  . Palpitations 06/26/2015  . SINOATRIAL NODE DYSFUNCTION 08/01/2009  . Cardiac pacemaker in situ 08/01/2009  . Type 2 diabetes mellitus with renal manifestations, controlled ([place]) 03/10/2009  . Dyslipidemia 03/10/2009  . Essential hypertension 03/10/2009    Past Surgical History:  Procedure Laterality Date  . COLONOSCOPY    . colonscopy    . HEMORRHOID SURGERY    . PACEMAKER INSERTION  2010   [facility]  . UMBILICAL HERNIA REPAIR  04/2008       Home Medications    Prior to Admission medications   Medication Sig Start Date End Date Taking? Authorizing Provider  aspirin 81 MG tablet Take 81 mg by mouth every evening.     Historical Provider, MD  docusate sodium (COLACE) 100 MG capsule Take 100 mg by mouth daily as needed for constipation.   10/22/12   Ripley Fraise, MD  fenofibrate 160 MG tablet Take 160 mg by mouth daily.    Historical Provider, MD  glipiZIDE (GLUCOTROL) 5 MG tablet Take 10 mg by mouth 2 (two) times daily.  02/13/11   Historical Provider, MD  linagliptin (TRADJENTA) 5 MG TABS tablet Take 5 mg by mouth daily.    Historical Provider, MD  losartan (COZAAR) 100 MG tablet Take 100 mg by mouth daily.  02/13/11   Historical Provider, MD  simvastatin (ZOCOR) 20 MG tablet Take 20 mg by mouth every evening.    Historical Provider, MD    Family History Family History  Problem Relation Age of Onset  . Heart disease Father   . Cancer Sister   . Cancer Sister   . Cancer Sister   . ALS Sister     Social History Social History  Substance Use Topics  . Smoking status: Never Smoker  . Smokeless tobacco: Never Used  . Alcohol use No     Allergies   Other   Review of Systems Review of Systems  Constitutional: Negative for fever.  Respiratory: Negative for shortness of breath.   Cardiovascular: Negative for chest pain and leg swelling.  Gastrointestinal: Negative for abdominal pain, rectal pain and vomiting.  Genitourinary: Negative for difficulty urinating.  Neurological: Positive for dizziness. Negative for weakness, numbness and headaches.  All other systems reviewed and are negative.    Physical  Exam Updated Vital Signs BP 134/76   Pulse 78   Temp 98.3 F (36.8 C) (Oral)   Resp 11   Ht 6' (1.829 m)   Wt 210 lb (95.3 kg)   SpO2 98%   BMI 28.48 kg/m   Physical Exam  Constitutional: He is oriented to person, place, and time. He appears well-developed and well-nourished.  Elderly, no acute distress  HENT:  Head: Normocephalic and atraumatic.  Eyes: EOM are normal. Pupils are equal, round, and reactive to light.  Neck: Neck supple.  Cardiovascular: Normal rate, regular rhythm and normal heart sounds.   Pulmonary/Chest: Effort normal and breath sounds normal. No respiratory distress. He has no  wheezes.  Abdominal: Soft. Bowel sounds are normal. There is no tenderness. There is no rebound.  Musculoskeletal: He exhibits edema.  Neurological: He is alert and oriented to person, place, and time.  Cranial nerves II through XII intact, 5 out of 5 strength in all 4 extremities, no drift, no dysmetria to finger-nose-finger, normal gait  Skin: Skin is warm and dry.  Psychiatric: He has a normal mood and affect.  Nursing note and vitals reviewed.    ED Treatments / Results  Labs (all labs ordered are listed, but only abnormal results are displayed) Labs Reviewed  CBC WITH DIFFERENTIAL/PLATELET - Abnormal; Notable for the following:       Result Value   WBC 3.6 (*)    Hemoglobin 11.0 (*)    HCT 33.8 (*)    MCH 25.8 (*)    All other components within normal limits  BASIC METABOLIC PANEL - Abnormal; Notable for the following:    Glucose, Bld 148 (*)    BUN 32 (*)    Creatinine, Ser 1.59 (*)    GFR calc non Af Amer 38 (*)    GFR calc Af Amer 44 (*)    Anion gap 2 (*)    All other components within normal limits    EKG  EKG Interpretation  Date/Time:  Tuesday April 20 2016 02:34:36 EDT Ventricular Rate:  75 PR Interval:    QRS Duration: 164 QT Interval:  441 QTC Calculation: 493 R Axis:   -68 Text Interpretation:  Atrial-sensed ventricular-paced rhythm No further analysis attempted due to paced rhythm No significant change since last tracing Confirmed by Paulino Cork  MD, Loma Sousa ([id number]) on 04/20/2016 3:28:05 AM       Radiology No results found.  Procedures Procedures (including critical care time)  Medications Ordered in ED Medications - No data to display   Initial Impression / Assessment and Plan / ED Course  I have reviewed the triage vital signs and the nursing notes.  Pertinent labs & imaging results that were available during my care of the patient were reviewed by me and considered in my medical decision making (see chart for details).  Clinical Course      Patient presents with dizziness. Currently at his baseline and asymptomatic. Neurologically intact. Vital signs reassuring. He is not orthostatic. EKG shows a paced rhythm. Denies any other symptoms. No signs or symptoms of stroke at this time. He is ambulatory independently. Basic lab work is at the patient's baseline. He was monitored and did not have recurrent symptoms. Discharge home with close primary care follow-up.  After history, exam, and medical workup I feel the patient has been appropriately medically screened and is safe for discharge home. Pertinent diagnoses were discussed with the patient. Patient was given return precautions.  Final Clinical Impressions(s) / ED Diagnoses  Final diagnoses:  Dizziness    New Prescriptions Discharge Medication List as of 04/20/2016  4:14 AM       Merryl Hacker, MD 04/20/16 [phone]

## 2016-04-21 DIAGNOSIS — E784 Other hyperlipidemia: Secondary | ICD-10-CM | POA: Diagnosis not present

## 2016-04-21 DIAGNOSIS — I10 Essential (primary) hypertension: Secondary | ICD-10-CM | POA: Diagnosis not present

## 2016-04-21 DIAGNOSIS — Z23 Encounter for immunization: Secondary | ICD-10-CM | POA: Diagnosis not present

## 2016-04-21 DIAGNOSIS — N182 Chronic kidney disease, stage 2 (mild): Secondary | ICD-10-CM | POA: Diagnosis not present

## 2016-04-21 DIAGNOSIS — E1165 Type 2 diabetes mellitus with hyperglycemia: Secondary | ICD-10-CM | POA: Diagnosis not present

## 2016-04-23 DIAGNOSIS — B351 Tinea unguium: Secondary | ICD-10-CM | POA: Diagnosis not present

## 2016-04-23 DIAGNOSIS — E1142 Type 2 diabetes mellitus with diabetic polyneuropathy: Secondary | ICD-10-CM | POA: Diagnosis not present

## 2016-05-30 ENCOUNTER — Inpatient Hospital Stay (HOSPITAL_COMMUNITY)
Admission: EM | Admit: 2016-05-30 | Discharge: 2016-06-02 | DRG: 864 | Disposition: A | Discharge status: Home / Self Care | Payer: Medicare Other | Attending: Internal Medicine | Admitting: Internal Medicine

## 2016-05-30 ENCOUNTER — Encounter (HOSPITAL_COMMUNITY): Payer: Self-pay

## 2016-05-30 DIAGNOSIS — Z7982 Long term (current) use of aspirin: Secondary | ICD-10-CM

## 2016-05-30 DIAGNOSIS — E785 Hyperlipidemia, unspecified: Secondary | ICD-10-CM | POA: Diagnosis present

## 2016-05-30 DIAGNOSIS — N183 Chronic kidney disease, stage 3 unspecified: Secondary | ICD-10-CM | POA: Diagnosis present

## 2016-05-30 DIAGNOSIS — R6883 Chills (without fever): Secondary | ICD-10-CM | POA: Diagnosis not present

## 2016-05-30 DIAGNOSIS — E1122 Type 2 diabetes mellitus with diabetic chronic kidney disease: Secondary | ICD-10-CM | POA: Diagnosis present

## 2016-05-30 DIAGNOSIS — I129 Hypertensive chronic kidney disease with stage 1 through stage 4 chronic kidney disease, or unspecified chronic kidney disease: Secondary | ICD-10-CM | POA: Diagnosis present

## 2016-05-30 DIAGNOSIS — E1129 Type 2 diabetes mellitus with other diabetic kidney complication: Secondary | ICD-10-CM | POA: Diagnosis present

## 2016-05-30 DIAGNOSIS — Z888 Allergy status to other drugs, medicaments and biological substances status: Secondary | ICD-10-CM

## 2016-05-30 DIAGNOSIS — R6510 Systemic inflammatory response syndrome (SIRS) of non-infectious origin without acute organ dysfunction: Secondary | ICD-10-CM | POA: Diagnosis not present

## 2016-05-30 DIAGNOSIS — Z950 Presence of cardiac pacemaker: Secondary | ICD-10-CM | POA: Diagnosis not present

## 2016-05-30 DIAGNOSIS — R509 Fever, unspecified: Secondary | ICD-10-CM | POA: Diagnosis not present

## 2016-05-30 DIAGNOSIS — R7989 Other specified abnormal findings of blood chemistry: Secondary | ICD-10-CM | POA: Diagnosis present

## 2016-05-30 DIAGNOSIS — R000 Tachycardia, unspecified: Secondary | ICD-10-CM | POA: Diagnosis present

## 2016-05-30 DIAGNOSIS — Z8249 Family history of ischemic heart disease and other diseases of the circulatory system: Secondary | ICD-10-CM

## 2016-05-30 DIAGNOSIS — E1121 Type 2 diabetes mellitus with diabetic nephropathy: Secondary | ICD-10-CM

## 2016-05-30 LAB — CBG MONITORING, ED: Glucose-Capillary: 119 mg/dL — ABNORMAL HIGH (ref 65–99)

## 2016-05-30 NOTE — ED Triage Notes (Signed)
Started shaking all over when I woke up a couple of hours ago.  I thought the house was cold and I turned the heat up, but I never stopped shaking.  States that he had some numbness in his legs down to his knees, but it went away.

## 2016-05-31 ENCOUNTER — Emergency Department (HOSPITAL_COMMUNITY): Payer: Medicare Other

## 2016-05-31 ENCOUNTER — Encounter (HOSPITAL_COMMUNITY): Payer: Self-pay

## 2016-05-31 DIAGNOSIS — R6510 Systemic inflammatory response syndrome (SIRS) of non-infectious origin without acute organ dysfunction: Secondary | ICD-10-CM | POA: Diagnosis not present

## 2016-05-31 DIAGNOSIS — E1122 Type 2 diabetes mellitus with diabetic chronic kidney disease: Secondary | ICD-10-CM | POA: Diagnosis present

## 2016-05-31 DIAGNOSIS — Z7982 Long term (current) use of aspirin: Secondary | ICD-10-CM | POA: Diagnosis not present

## 2016-05-31 DIAGNOSIS — E1121 Type 2 diabetes mellitus with diabetic nephropathy: Secondary | ICD-10-CM | POA: Diagnosis not present

## 2016-05-31 DIAGNOSIS — R509 Fever, unspecified: Secondary | ICD-10-CM | POA: Diagnosis present

## 2016-05-31 DIAGNOSIS — Z950 Presence of cardiac pacemaker: Secondary | ICD-10-CM | POA: Diagnosis not present

## 2016-05-31 DIAGNOSIS — I129 Hypertensive chronic kidney disease with stage 1 through stage 4 chronic kidney disease, or unspecified chronic kidney disease: Secondary | ICD-10-CM | POA: Diagnosis present

## 2016-05-31 DIAGNOSIS — N183 Chronic kidney disease, stage 3 (moderate): Secondary | ICD-10-CM | POA: Diagnosis present

## 2016-05-31 DIAGNOSIS — E1165 Type 2 diabetes mellitus with hyperglycemia: Secondary | ICD-10-CM | POA: Diagnosis not present

## 2016-05-31 DIAGNOSIS — I10 Essential (primary) hypertension: Secondary | ICD-10-CM | POA: Diagnosis not present

## 2016-05-31 DIAGNOSIS — R6883 Chills (without fever): Secondary | ICD-10-CM | POA: Diagnosis not present

## 2016-05-31 DIAGNOSIS — E785 Hyperlipidemia, unspecified: Secondary | ICD-10-CM | POA: Diagnosis present

## 2016-05-31 DIAGNOSIS — Z8249 Family history of ischemic heart disease and other diseases of the circulatory system: Secondary | ICD-10-CM | POA: Diagnosis not present

## 2016-05-31 DIAGNOSIS — R7989 Other specified abnormal findings of blood chemistry: Secondary | ICD-10-CM | POA: Diagnosis present

## 2016-05-31 DIAGNOSIS — Z888 Allergy status to other drugs, medicaments and biological substances status: Secondary | ICD-10-CM | POA: Diagnosis not present

## 2016-05-31 DIAGNOSIS — R000 Tachycardia, unspecified: Secondary | ICD-10-CM | POA: Diagnosis present

## 2016-05-31 LAB — COMPREHENSIVE METABOLIC PANEL
ALBUMIN: 4.3 g/dL (ref 3.5–5.0)
ALK PHOS: 41 U/L (ref 38–126)
ALT: 30 U/L (ref 17–63)
ALT: 31 U/L (ref 17–63)
AST: 40 U/L (ref 15–41)
AST: 45 U/L — AB (ref 15–41)
Albumin: 3.5 g/dL (ref 3.5–5.0)
Alkaline Phosphatase: 34 U/L — ABNORMAL LOW (ref 38–126)
Anion gap: 6 (ref 5–15)
Anion gap: 9 (ref 5–15)
BILIRUBIN TOTAL: 0.9 mg/dL (ref 0.3–1.2)
BUN: 32 mg/dL — ABNORMAL HIGH (ref 6–20)
BUN: 35 mg/dL — AB (ref 6–20)
CALCIUM: 9.5 mg/dL (ref 8.9–10.3)
CHLORIDE: 108 mmol/L (ref 101–111)
CO2: 22 mmol/L (ref 22–32)
CO2: 26 mmol/L (ref 22–32)
CREATININE: 1.86 mg/dL — AB (ref 0.61–1.24)
Calcium: 8.6 mg/dL — ABNORMAL LOW (ref 8.9–10.3)
Chloride: 106 mmol/L (ref 101–111)
Creatinine, Ser: 1.74 mg/dL — ABNORMAL HIGH (ref 0.61–1.24)
GFR calc Af Amer: 37 mL/min — ABNORMAL LOW (ref >60)
GFR, EST AFRICAN AMERICAN: 40 mL/min — AB (ref >60)
GFR, EST NON AFRICAN AMERICAN: 32 mL/min — AB (ref >60)
GFR, EST NON AFRICAN AMERICAN: 34 mL/min — AB (ref >60)
GLUCOSE: 165 mg/dL — AB (ref 65–99)
Glucose, Bld: 186 mg/dL — ABNORMAL HIGH (ref 65–99)
POTASSIUM: 4.3 mmol/L (ref 3.5–5.1)
POTASSIUM: 4.4 mmol/L (ref 3.5–5.1)
SODIUM: 136 mmol/L (ref 135–145)
Sodium: 141 mmol/L (ref 135–145)
TOTAL PROTEIN: 7.4 g/dL (ref 6.5–8.1)
Total Bilirubin: 1 mg/dL (ref 0.3–1.2)
Total Protein: 6.2 g/dL — ABNORMAL LOW (ref 6.5–8.1)

## 2016-05-31 LAB — CBC WITH DIFFERENTIAL/PLATELET
BASOS ABS: 0 10*3/uL (ref 0.0–0.1)
Basophils Relative: 0 %
EOS ABS: 0.2 10*3/uL (ref 0.0–0.7)
EOS PCT: 5 %
HCT: 38.8 % — ABNORMAL LOW (ref 39.0–52.0)
Hemoglobin: 12.5 g/dL — ABNORMAL LOW (ref 13.0–17.0)
LYMPHS PCT: 32 %
Lymphs Abs: 1.3 10*3/uL (ref 0.7–4.0)
MCH: 25.9 pg — ABNORMAL LOW (ref 26.0–34.0)
MCHC: 32.2 g/dL (ref 30.0–36.0)
MCV: 80.3 fL (ref 78.0–100.0)
MONO ABS: 0 10*3/uL — AB (ref 0.1–1.0)
Monocytes Relative: 1 %
Neutro Abs: 2.5 10*3/uL (ref 1.7–7.7)
Neutrophils Relative %: 62 %
PLATELETS: 176 10*3/uL (ref 150–400)
RBC: 4.83 MIL/uL (ref 4.22–5.81)
RDW: 14.8 % (ref 11.5–15.5)
WBC: 4 10*3/uL (ref 4.0–10.5)

## 2016-05-31 LAB — URINALYSIS, ROUTINE W REFLEX MICROSCOPIC
Bilirubin Urine: NEGATIVE
Glucose, UA: NEGATIVE mg/dL
Hgb urine dipstick: NEGATIVE
KETONES UR: NEGATIVE mg/dL
LEUKOCYTES UA: NEGATIVE
NITRITE: NEGATIVE
PH: 6.5 (ref 5.0–8.0)
PROTEIN: NEGATIVE mg/dL
Specific Gravity, Urine: 1.01 (ref 1.005–1.030)

## 2016-05-31 LAB — INFLUENZA PANEL BY PCR (TYPE A & B)
INFLAPCR: NEGATIVE
INFLBPCR: NEGATIVE

## 2016-05-31 LAB — TROPONIN I
TROPONIN I: 0.06 ng/mL — AB (ref <0.03)
TROPONIN I: 0.05 ng/mL — AB (ref <0.03)
Troponin I: 0.03 ng/mL (ref <0.03)
Troponin I: 0.06 ng/mL (ref <0.03)

## 2016-05-31 LAB — CBC
HEMATOCRIT: 33.8 % — AB (ref 39.0–52.0)
HEMOGLOBIN: 11 g/dL — AB (ref 13.0–17.0)
MCH: 25.6 pg — ABNORMAL LOW (ref 26.0–34.0)
MCHC: 32.5 g/dL (ref 30.0–36.0)
MCV: 78.8 fL (ref 78.0–100.0)
Platelets: 162 10*3/uL (ref 150–400)
RBC: 4.29 MIL/uL (ref 4.22–5.81)
RDW: 14.8 % (ref 11.5–15.5)
WBC: 5.4 10*3/uL (ref 4.0–10.5)

## 2016-05-31 LAB — GLUCOSE, CAPILLARY
GLUCOSE-CAPILLARY: 171 mg/dL — AB (ref 65–99)
GLUCOSE-CAPILLARY: 140 mg/dL — AB (ref 65–99)
GLUCOSE-CAPILLARY: 129 mg/dL — AB (ref 65–99)
Glucose-Capillary: 72 mg/dL (ref 65–99)
Glucose-Capillary: 92 mg/dL (ref 65–99)

## 2016-05-31 LAB — I-STAT CG4 LACTIC ACID, ED
Lactic Acid, Venous: 4.94 mmol/L (ref 0.5–1.9)
Lactic Acid, Venous: 1.16 mmol/L (ref 0.5–1.9)

## 2016-05-31 LAB — LACTIC ACID, PLASMA: LACTIC ACID, VENOUS: 1.8 mmol/L (ref 0.5–1.9)

## 2016-05-31 LAB — CREATININE, SERUM
Creatinine, Ser: 1.76 mg/dL — ABNORMAL HIGH (ref 0.61–1.24)
GFR, EST AFRICAN AMERICAN: 39 mL/min — AB (ref >60)
GFR, EST NON AFRICAN AMERICAN: 34 mL/min — AB (ref >60)

## 2016-05-31 MED ORDER — ASPIRIN EC 81 MG PO TBEC
81.0000 mg | DELAYED_RELEASE_TABLET | Freq: Every evening | ORAL | Status: DC
  Administered 2016-05-31 – 2016-06-01 (×2): 81 mg via ORAL
  Filled 2016-05-31 (×2): qty 1

## 2016-05-31 MED ORDER — ONDANSETRON HCL 4 MG/2ML IJ SOLN: 4.0000 mg | Freq: Four times a day (QID) | INTRAMUSCULAR | Status: DC | PRN

## 2016-05-31 MED ORDER — SODIUM CHLORIDE 0.9 % IV BOLUS (SEPSIS)
1000.0000 mL | Freq: Once | INTRAVENOUS | Status: AC
  Administered 2016-05-31: 1000 mL via INTRAVENOUS

## 2016-05-31 MED ORDER — LOSARTAN POTASSIUM 50 MG PO TABS
100.0000 mg | ORAL_TABLET | Freq: Every day | ORAL | Status: DC
  Administered 2016-05-31 – 2016-06-02 (×3): 100 mg via ORAL
  Filled 2016-05-31 (×3): qty 2

## 2016-05-31 MED ORDER — FENOFIBRATE 160 MG PO TABS
160.0000 mg | ORAL_TABLET | Freq: Every day | ORAL | Status: DC
Start: 2016-05-31 — End: 2016-06-02
  Administered 2016-05-31 – 2016-06-02 (×3): 160 mg via ORAL
  Filled 2016-05-31 (×3): qty 1

## 2016-05-31 MED ORDER — ONDANSETRON HCL 4 MG PO TABS: 4.0000 mg | ORAL_TABLET | Freq: Four times a day (QID) | ORAL | Status: DC | PRN

## 2016-05-31 MED ORDER — VANCOMYCIN HCL 500 MG IV SOLR
500.0000 mg | Freq: Once | INTRAVENOUS | Status: AC
  Administered 2016-05-31: 500 mg via INTRAVENOUS
  Filled 2016-05-31: qty 500

## 2016-05-31 MED ORDER — ACETAMINOPHEN 325 MG PO TABS: 650.0000 mg | ORAL_TABLET | Freq: Four times a day (QID) | ORAL | Status: DC | PRN

## 2016-05-31 MED ORDER — SIMVASTATIN 20 MG PO TABS
20.0000 mg | ORAL_TABLET | Freq: Every evening | ORAL | Status: DC
  Administered 2016-05-31 – 2016-06-01 (×2): 20 mg via ORAL
  Filled 2016-05-31 (×2): qty 1

## 2016-05-31 MED ORDER — INSULIN ASPART 100 UNIT/ML ~~LOC~~ SOLN
0.0000 [IU] | Freq: Three times a day (TID) | SUBCUTANEOUS | Status: DC
  Administered 2016-05-31 – 2016-06-01 (×3): 1 [IU] via SUBCUTANEOUS
  Administered 2016-06-01 – 2016-06-02 (×2): 2 [IU] via SUBCUTANEOUS

## 2016-05-31 MED ORDER — VANCOMYCIN HCL IN DEXTROSE 1-5 GM/200ML-% IV SOLN
1000.0000 mg | Freq: Once | INTRAVENOUS | Status: AC
  Administered 2016-05-31: 1000 mg via INTRAVENOUS
  Filled 2016-05-31: qty 200

## 2016-05-31 MED ORDER — PIPERACILLIN-TAZOBACTAM 3.375 G IVPB
3.3750 g | Freq: Three times a day (TID) | INTRAVENOUS | Status: DC
  Administered 2016-05-31 – 2016-06-02 (×7): 3.375 g via INTRAVENOUS
  Filled 2016-05-31 (×4): qty 50

## 2016-05-31 MED ORDER — VANCOMYCIN HCL 10 G IV SOLR
1250.0000 mg | INTRAVENOUS | Status: DC
  Administered 2016-06-01 – 2016-06-02 (×2): 1250 mg via INTRAVENOUS
  Filled 2016-05-31 (×4): qty 1250

## 2016-05-31 MED ORDER — ENOXAPARIN SODIUM 40 MG/0.4ML ~~LOC~~ SOLN
40.0000 mg | SUBCUTANEOUS | Status: DC
  Administered 2016-05-31 – 2016-06-02 (×3): 40 mg via SUBCUTANEOUS
  Filled 2016-05-31 (×3): qty 0.4

## 2016-05-31 MED ORDER — PIPERACILLIN-TAZOBACTAM 3.375 G IVPB 30 MIN
3.3750 g | Freq: Once | INTRAVENOUS | Status: AC
  Administered 2016-05-31: 3.375 g via INTRAVENOUS
  Filled 2016-05-31: qty 50

## 2016-05-31 MED ORDER — ACETAMINOPHEN 650 MG RE SUPP: 650.0000 mg | Freq: Four times a day (QID) | RECTAL | Status: DC | PRN

## 2016-05-31 MED ORDER — VANCOMYCIN HCL 500 MG IV SOLR
INTRAVENOUS | Status: AC
  Filled 2016-05-31: qty 500

## 2016-05-31 MED ORDER — SODIUM CHLORIDE 0.9 % IV SOLN
1000.0000 mL | INTRAVENOUS | Status: DC
  Administered 2016-05-31 – 2016-06-01 (×2): 1000 mL via INTRAVENOUS

## 2016-05-31 MED ORDER — ACETAMINOPHEN 500 MG PO TABS
1000.0000 mg | ORAL_TABLET | Freq: Once | ORAL | Status: AC
  Administered 2016-05-31: 1000 mg via ORAL
  Filled 2016-05-31: qty 2

## 2016-05-31 NOTE — Progress Notes (Signed)
ANTIBIOTIC CONSULT NOTE-Preliminary  Pharmacy Consult for Vancomycin and Zosyn Indication: Sepsis  Allergies  Allergen Reactions  . Other     "some kind of fluid pill I can't take"     Patient Measurements: Height: 6' (182.9 cm) Weight: 215 lb (97.5 kg) IBW/kg (Calculated) : 77.6  Vital Signs: Temp: 100.8 F (38.2 C) (11/20 0152) Temp Source: Rectal (11/20 0152) BP: 117/65 (11/20 0200) Pulse Rate: 63 (11/20 0000)  Labs:  Recent Labs  05/31/16 0015  WBC 4.0  HGB 12.5*  PLT 176  CREATININE 1.86*    Estimated Creatinine Clearance: 35.8 mL/min (by C-G formula based on SCr of 1.86 mg/dL (H)).  No results for input(s): VANCOTROUGH, VANCOPEAK, VANCORANDOM, GENTTROUGH, GENTPEAK, GENTRANDOM, TOBRATROUGH, TOBRAPEAK, TOBRARND, AMIKACINPEAK, AMIKACINTROU, AMIKACIN in the last 72 hours.   Microbiology: Recent Results (from the past 720 hour(s))  Blood Culture (routine x 2)     Status: None (Preliminary result)   Collection Time: 05/31/16 12:31 AM  Result Value Ref Range Status   Specimen Description BLOOD RIGHT ARM  Final   Special Requests BOTTLES DRAWN AEROBIC ONLY 6CC DRAWN BY RN  Final   Culture PENDING  Incomplete   Report Status PENDING  Incomplete  Blood Culture (routine x 2)     Status: None (Preliminary result)   Collection Time: 05/31/16 12:31 AM  Result Value Ref Range Status   Specimen Description BLOOD RIGHT ARM  Final   Special Requests BOTTLES DRAWN AEROBIC ONLY 6CC DRAWN BY RN  Final   Culture PENDING  Incomplete   Report Status PENDING  Incomplete    Medical History: Past Medical History:  Diagnosis Date  . CKD (chronic kidney disease), stage I   . Diabetes mellitus   . First degree AV block   . Hyperlipidemia   . Hypertension   . Second degree AV block   . Sinus bradycardia     Medications:  Zosyn 3.375 IV x 1 dose in the ED Vancomycin 1 Gm IV in the ED   Assessment: 82 yo male seen in the ED with elevated temperature, chills and  tachycardia. Blood and urine cultures pending. Empiric antibiotics for sepsis.  Goal of Therapy:  Vancomycin troughs 15-20 mcg/ml  Plan:  Preliminary review of pertinent patient information completed.  Protocol will be initiated with a one-time dose of Vancomycin 500 mg IV, in addition to the  1 Gm  dose given in the ED for a total dose of 1500 mg.  Forestine Na clinical pharmacist will complete review during morning rounds to assess patient and finalize treatment regimen.  Norberto Sorenson, [place] 05/31/2016,2:31 AM

## 2016-05-31 NOTE — Progress Notes (Signed)
PT troponin increase to 0.06. MD notified. Continue to monitor.

## 2016-05-31 NOTE — Care Management Obs Status (Signed)
[facility] NOTIFICATION   Patient Details  Name: TYLIEK TIMBERMAN MRN: [id number] Date of Birth: [birth year]   Medicare Observation Status Notification Given:  Yes    Patrece Tallie, Chauncey Reading, RN 05/31/2016, 2:32 PM

## 2016-05-31 NOTE — Care Management Note (Signed)
Case Management Note  Patient Details  Name: Kyle Schultz MRN: [id number] Date of Birth: 26-Sep-1929  Subjective/Objective:  Patient adm with fever, chills, SIRS. Patient is from home, grandson lives with him. Patient reports ind with ADL's, still drives. He has a PCP,  Transportation, and insurance with drug coverage.                 Action/Plan: Anticipate DC home with self care. Will follow for needs.   Expected Discharge Date:       06/02/2016           Expected Discharge Plan:  Home/Self Care  In-House Referral:  NA  Discharge planning Services  CM Consult  Post Acute Care Choice:    Choice offered to:     DME Arranged:    DME Agency:     HH Arranged:    HH Agency:     Status of Service:  In process, will continue to follow  If discussed at Long Length of Stay Meetings, dates discussed:    Additional Comments:  Cambryn Charters, Chauncey Reading, RN 05/31/2016, 2:30 PM

## 2016-05-31 NOTE — ED Notes (Signed)
CRITICAL VALUE ALERT  Critical value received:  Troponin 0.03 mg/dl  Date of notification:  05/31/16  Time of notification:  4069 hrs  Critical value read back:Yes.    Nurse who received alert:  Y. Rhyker Silversmith, RN  Responding MD:  Dr. Leonides Schanz  Time MD responded:  8614 hrs

## 2016-05-31 NOTE — H&P (Signed)
[facility] H&P    Patient Demographics:    Kyle Schultz, is a 82 y.o. male  MRN: [id number]  DOB - 1930/03/28  Admit Date - 05/30/2016  Referring MD/NP/PA: Dr. Leonides Schanz  Outpatient Primary MD for the patient is Rosita Fire, MD  Patient coming from:  home  Chief Complaint  Patient presents with  . Shaking      HPI:    Kyle Schultz  is a 82 y.o. male, With history of diabetes mellitus, hyperlipidemia came to hospital with sudden onset of fever and chills. Patient says that started around 9:30 PM. He got concerned so he came to the hospital. He denies chest pain or shortness of breath. No nausea had one episode of vomiting. No diarrhea. Denies dysuria urgency or frequency of urination. No chest pain or shortness of breath.  In the ED patient found to be febrile, with lactic acid 4.94 which improved to 1.16. Mild elevation of troponin 0.03. Normal WBC    Review of systems:    In addition to the HPI above,  No Fever-chills, No Headache, No changes with Vision or hearing, No problems swallowing food or Liquids, No Chest pain, Cough or Shortness of Breath, No Blood in stool or Urine, No dysuria, No new skin rashes or bruises, No new joints pains-aches,  No new weakness, tingling, numbness in any extremity, No recent weight gain or loss, No polyuria, polydypsia or polyphagia, No significant Mental Stressors.  A full 10 point Review of Systems was done, except as stated above, all other Review of Systems were negative.   With Past History of the following :    Past Medical History:  Diagnosis Date  . CKD (chronic kidney disease), stage I   . Diabetes mellitus   . First degree AV block   . Hyperlipidemia   . Hypertension   . Second degree AV block   . Sinus bradycardia       Past Surgical History:  Procedure Laterality Date  . COLONOSCOPY    . colonscopy    . HEMORRHOID SURGERY    . PACEMAKER  INSERTION  2010   [facility]  . UMBILICAL HERNIA REPAIR  04/2008      Social History:      Social History  Substance Use Topics  . Smoking status: Never Smoker  . Smokeless tobacco: Never Used  . Alcohol use No       Family History :     Family History  Problem Relation Age of Onset  . Heart disease Father   . Cancer Sister   . Cancer Sister   . Cancer Sister   . ALS Sister       Home Medications:   Prior to Admission medications   Medication Sig Start Date End Date Taking? Authorizing Provider  aspirin 81 MG tablet Take 81 mg by mouth every evening.     Historical Provider, MD  docusate sodium (COLACE) 100 MG capsule Take 100 mg by mouth daily as needed for constipation.  10/22/12   Ripley Fraise, MD  fenofibrate 160  MG tablet Take 160 mg by mouth daily.    Historical Provider, MD  glipiZIDE (GLUCOTROL) 5 MG tablet Take 10 mg by mouth 2 (two) times daily.  02/13/11   Historical Provider, MD  linagliptin (TRADJENTA) 5 MG TABS tablet Take 5 mg by mouth daily.    Historical Provider, MD  losartan (COZAAR) 100 MG tablet Take 100 mg by mouth daily.  02/13/11   Historical Provider, MD  simvastatin (ZOCOR) 20 MG tablet Take 20 mg by mouth every evening.    Historical Provider, MD     Allergies:     Allergies  Allergen Reactions  . Other     "some kind of fluid pill I can't take"      Physical Exam:   Vitals  Blood pressure 117/65, pulse 63, temperature 100.8 F (38.2 C), temperature source Rectal, resp. rate 16, height 6' (1.829 m), weight 97.5 kg (215 lb), SpO2 97 %.  1.  General: Elderly African-American male in no acute distress  2. Psychiatric:  Intact judgement and  insight, awake alert, oriented x 3.  3. Neurologic: No focal neurological deficits, all cranial nerves intact.Strength 5/5 all 4 extremities, sensation intact all 4 extremities, plantars down going.  4. Eyes :  anicteric sclerae, moist conjunctivae with no lid lag. PERRLA.  5. ENMT:    Oropharynx clear with moist mucous membranes and good dentition  6. Neck:  supple, no cervical lymphadenopathy appriciated, No thyromegaly  7. Respiratory : Normal respiratory effort, good air movement bilaterally,clear to  auscultation bilaterally  8. Cardiovascular : RRR, no gallops, rubs or murmurs, no leg edema  9. Gastrointestinal:  Positive bowel sounds, abdomen soft, non-tender to palpation,no hepatosplenomegaly, no rigidity or guarding       10. Skin:  No cyanosis, normal texture and turgor, no rash, lesions or ulcers  11.Musculoskeletal:  Good muscle tone,  joints appear normal , no effusions,  normal range of motion    Data Review:    CBC  Recent Labs Lab 05/31/16 0015  WBC 4.0  HGB 12.5*  HCT 38.8*  PLT 176  MCV 80.3  MCH 25.9*  MCHC 32.2  RDW 14.8  LYMPHSABS 1.3  MONOABS 0.0*  EOSABS 0.2  BASOSABS 0.0   ------------------------------------------------------------------------------------------------------------------  Chemistries   Recent Labs Lab 05/31/16 0015  NA 141  K 4.4  CL 106  CO2 26  GLUCOSE 165*  BUN 35*  CREATININE 1.86*  CALCIUM 9.5  AST 45*  ALT 31  ALKPHOS 41  BILITOT 0.9   ------------------------------------------------------------------------------------------------------------------  ------------------------------------------------------------------------------------------------------------------ GFR: Estimated Creatinine Clearance: 35.8 mL/min (by C-G formula based on SCr of 1.86 mg/dL (H)). Liver Function Tests:  Recent Labs Lab 05/31/16 0015  AST 45*  ALT 31  ALKPHOS 41  BILITOT 0.9  PROT 7.4  ALBUMIN 4.3   No results for input(s): LIPASE, AMYLASE in the last 168 hours. No results for input(s): AMMONIA in the last 168 hours. Coagulation Profile: No results for input(s): INR, PROTIME in the last 168 hours. Cardiac Enzymes:  Recent Labs Lab 05/31/16 0015  TROPONINI 0.03*   BNP (last 3 results) No  results for input(s): PROBNP in the last 8760 hours. HbA1C: No results for input(s): HGBA1C in the last 72 hours. CBG:  Recent Labs Lab 05/30/16 2352  GLUCAP 119*   Lipid Profile: No results for input(s): CHOL, HDL, LDLCALC, TRIG, CHOLHDL, LDLDIRECT in the last 72 hours. Thyroid Function Tests: No results for input(s): TSH, T4TOTAL, FREET4, T3FREE, THYROIDAB in the last 72 hours. Anemia  Panel: No results for input(s): VITAMINB12, FOLATE, FERRITIN, TIBC, IRON, RETICCTPCT in the last 72 hours.  --------------------------------------------------------------------------------------------------------------- Urine analysis:    Component Value Date/Time   COLORURINE YELLOW 05/31/2016 0015   APPEARANCEUR CLEAR 05/31/2016 0015   LABSPEC 1.010 05/31/2016 0015   PHURINE 6.5 05/31/2016 0015   GLUCOSEU NEGATIVE 05/31/2016 0015   HGBUR NEGATIVE 05/31/2016 0015   BILIRUBINUR NEGATIVE 05/31/2016 0015   KETONESUR NEGATIVE 05/31/2016 0015   PROTEINUR NEGATIVE 05/31/2016 0015   UROBILINOGEN 0.2 01/12/2015 0845   NITRITE NEGATIVE 05/31/2016 0015   LEUKOCYTESUR NEGATIVE 05/31/2016 0015      Imaging Results:    Dg Chest 2 View  Result Date: 05/31/2016 CLINICAL DATA:  Chills. EXAM: CHEST  2 VIEW COMPARISON:  Chest radiograph 03/03/2016 FINDINGS: Unchanged left chest wall dual lead pacemaker. Aortic arch atherosclerosis. Cardiomediastinal contours are unchanged. Calcified granuloma in the right lung base is unchanged. No focal airspace consolidation or pulmonary edema. No pleural effusion or pneumothorax. IMPRESSION: No active cardiopulmonary disease. Aortic atherosclerosis. Electronically Signed   By: Ulyses Jarred M.D.   On: 05/31/2016 01:10    My personal review of EKG: Paced rhythm   Assessment & Plan:    Active Problems:   Type 2 diabetes mellitus with renal manifestations, controlled ([facility])   Cardiac pacemaker in situ   Chronic renal insufficiency, stage III (moderate)   SIRS  (systemic inflammatory response syndrome) ([facility])   Fever   1. Fever/SIRS- patient presenting with fever, tachycardia with no clear source of infection. Blood and urine culture has been obtained. Patient empirically started on vancomycin and Zosyn. Follow culture results. 2. Diabetes mellitus- we'll start sliding scale insulin with NovoLog. 3. Chronic kidney stage III- today creatinine 1.86, patient's baseline creatinine 1.5. Started on IV fluids. Follow BMP in a.m. 4. Mild elevation of troponin- patient has mild elevation of troponin 0.03, will cycle cardiac enzymes to 6 hours time 3 5. Hypertension- patient blood pressure is stable, continue Cozaar daily.   DVT Prophylaxis-   Lovenox   AM Labs Ordered, also please review Full Orders  Family Communication: No family present at bedside  Code Status:  Full code  Admission status: Observation  Time spent in minutes : 60 minutes   Gerrica Cygan S M.D on 05/31/2016 at 2:35 AM  Between 7am to 7pm - Pager - [phone]. After 7pm go to [email or web address] - password [facility]  [facility] - Office  [phone]

## 2016-05-31 NOTE — Progress Notes (Signed)
Subjective: Patient was admitted last night due to fever and chills. His chest x-ray was negative.  Influenza screening was negative. No leucocytosis. No localizing symptoms. Patient is empericallly started on combinations of antibiotics.  Objective: Vital signs in last 24 hours: Temp:  [98 F (36.7 C)-102.8 F (39.3 C)] 98.9 F (37.2 C) (11/20 0327) Pulse Rate:  [52-75] 52 (11/20 0327) Resp:  [16-22] 16 (11/20 0327) BP: (117-161)/(54-87) 122/57 (11/20 0327) SpO2:  [96 %-100 %] 100 % (11/20 0327) Weight:  [97.5 kg (215 lb)] 97.5 kg (215 lb) (11/20 0327) Weight change:  Last BM Date: 05/30/16  Intake/Output from previous day: 11/19 0701 - 11/20 0700 In: 739.6 [I.V.:639.6; IV Piggyback:100] Out: 200 [Urine:200]  PHYSICAL EXAM General appearance: alert and no distress Resp: clear to auscultation bilaterally Cardio: S1, S2 normal GI: soft, non-tender; bowel sounds normal; no masses,  no organomegaly Extremities: extremities normal, atraumatic, no cyanosis or edema  Lab Results:  Results for orders placed or performed during the hospital encounter of 05/30/16 (from the past 48 hour(s))  CBG monitoring, ED     Status: Abnormal   Collection Time: 05/30/16 11:52 PM  Result Value Ref Range   Glucose-Capillary 119 (H) 65 - 99 mg/dL  Comprehensive metabolic panel     Status: Abnormal   Collection Time: 05/31/16 12:15 AM  Result Value Ref Range   Sodium 141 135 - 145 mmol/L   Potassium 4.4 3.5 - 5.1 mmol/L   Chloride 106 101 - 111 mmol/L   CO2 26 22 - 32 mmol/L   Glucose, Bld 165 (H) 65 - 99 mg/dL   BUN 35 (H) 6 - 20 mg/dL   Creatinine, Ser 1.86 (H) 0.61 - 1.24 mg/dL   Calcium 9.5 8.9 - 10.3 mg/dL   Total Protein 7.4 6.5 - 8.1 g/dL   Albumin 4.3 3.5 - 5.0 g/dL   AST 45 (H) 15 - 41 U/L   ALT 31 17 - 63 U/L   Alkaline Phosphatase 41 38 - 126 U/L   Total Bilirubin 0.9 0.3 - 1.2 mg/dL   GFR calc non Af Amer 32 (L) >60 mL/min   GFR calc Af Amer 37 (L) >60 mL/min    Comment:  (NOTE) The eGFR has been calculated using the CKD EPI equation. This calculation has not been validated in all clinical situations. eGFR's persistently <60 mL/min signify possible Chronic Kidney Disease.    Anion gap 9 5 - 15  CBC WITH DIFFERENTIAL     Status: Abnormal   Collection Time: 05/31/16 12:15 AM  Result Value Ref Range   WBC 4.0 4.0 - 10.5 K/uL   RBC 4.83 4.22 - 5.81 MIL/uL   Hemoglobin 12.5 (L) 13.0 - 17.0 g/dL   HCT 38.8 (L) 39.0 - 52.0 %   MCV 80.3 78.0 - 100.0 fL   MCH 25.9 (L) 26.0 - 34.0 pg   MCHC 32.2 30.0 - 36.0 g/dL   RDW 14.8 11.5 - 15.5 %   Platelets 176 150 - 400 K/uL   Neutrophils Relative % 62 %   Neutro Abs 2.5 1.7 - 7.7 K/uL   Lymphocytes Relative 32 %   Lymphs Abs 1.3 0.7 - 4.0 K/uL   Monocytes Relative 1 %   Monocytes Absolute 0.0 (L) 0.1 - 1.0 K/uL   Eosinophils Relative 5 %   Eosinophils Absolute 0.2 0.0 - 0.7 K/uL   Basophils Relative 0 %   Basophils Absolute 0.0 0.0 - 0.1 K/uL  Urinalysis, Routine w reflex microscopic (not at  [facility])     Status: None   Collection Time: 05/31/16 12:15 AM  Result Value Ref Range   Color, Urine YELLOW YELLOW   APPearance CLEAR CLEAR   Specific Gravity, Urine 1.010 1.005 - 1.030   pH 6.5 5.0 - 8.0   Glucose, UA NEGATIVE NEGATIVE mg/dL   Hgb urine dipstick NEGATIVE NEGATIVE   Bilirubin Urine NEGATIVE NEGATIVE   Ketones, ur NEGATIVE NEGATIVE mg/dL   Protein, ur NEGATIVE NEGATIVE mg/dL   Nitrite NEGATIVE NEGATIVE   Leukocytes, UA NEGATIVE NEGATIVE    Comment: MICROSCOPIC NOT DONE ON URINES WITH NEGATIVE PROTEIN, BLOOD, LEUKOCYTES, NITRITE, OR GLUCOSE <1000 mg/dL.  Troponin I     Status: Abnormal   Collection Time: 05/31/16 12:15 AM  Result Value Ref Range   Troponin I 0.03 (HH) <0.03 ng/mL    Comment: CRITICAL RESULT CALLED TO, READ BACK BY AND VERIFIED WITH:  WALL,E @ 4356 ON 05/31/16 BY JUW   Blood Culture (routine x 2)     Status: None (Preliminary result)   Collection Time: 05/31/16 12:31 AM  Result  Value Ref Range   Specimen Description BLOOD RIGHT ARM    Special Requests BOTTLES DRAWN AEROBIC ONLY [place] DRAWN BY RN    Culture PENDING    Report Status PENDING   Blood Culture (routine x 2)     Status: None (Preliminary result)   Collection Time: 05/31/16 12:31 AM  Result Value Ref Range   Specimen Description BLOOD RIGHT ARM    Special Requests BOTTLES DRAWN AEROBIC ONLY [place] DRAWN BY RN    Culture PENDING    Report Status PENDING   Lactic acid, plasma     Status: None   Collection Time: 05/31/16 12:50 AM  Result Value Ref Range   Lactic Acid, Venous 1.8 0.5 - 1.9 mmol/L  I-Stat CG4 Lactic Acid, ED  (not at  [facility])     Status: Abnormal   Collection Time: 05/31/16  1:22 AM  Result Value Ref Range   Lactic Acid, Venous 4.94 (HH) 0.5 - 1.9 mmol/L   Comment NOTIFIED PHYSICIAN   I-Stat CG4 Lactic Acid, ED     Status: None   Collection Time: 05/31/16  1:34 AM  Result Value Ref Range   Lactic Acid, Venous 1.16 0.5 - 1.9 mmol/L  Influenza panel by PCR (type A & B, H1N1)     Status: None   Collection Time: 05/31/16  1:37 AM  Result Value Ref Range   Influenza A By PCR NEGATIVE NEGATIVE   Influenza B By PCR NEGATIVE NEGATIVE    Comment: (NOTE) The Xpert Xpress Flu assay is intended as an aid in the diagnosis of  influenza and should not be used as a sole basis for treatment.  This  assay is FDA approved for nasopharyngeal swab specimens only. Nasal  washings and aspirates are unacceptable for Xpert Xpress Flu testing.   Glucose, capillary     Status: Abnormal   Collection Time: 05/31/16  3:43 AM  Result Value Ref Range   Glucose-Capillary 171 (H) 65 - 99 mg/dL  CBC     Status: Abnormal   Collection Time: 05/31/16  4:02 AM  Result Value Ref Range   WBC 5.4 4.0 - 10.5 K/uL   RBC 4.29 4.22 - 5.81 MIL/uL   Hemoglobin 11.0 (L) 13.0 - 17.0 g/dL   HCT 33.8 (L) 39.0 - 52.0 %   MCV 78.8 78.0 - 100.0 fL   MCH 25.6 (L) 26.0 - 34.0 pg  MCHC 32.5 30.0 - 36.0 g/dL   RDW 14.8 11.5 - 15.5 %    Platelets 162 150 - 400 K/uL  Creatinine, serum     Status: Abnormal   Collection Time: 05/31/16  4:02 AM  Result Value Ref Range   Creatinine, Ser 1.76 (H) 0.61 - 1.24 mg/dL   GFR calc non Af Amer 34 (L) >60 mL/min   GFR calc Af Amer 39 (L) >60 mL/min    Comment: (NOTE) The eGFR has been calculated using the CKD EPI equation. This calculation has not been validated in all clinical situations. eGFR's persistently <60 mL/min signify possible Chronic Kidney Disease.   Troponin I     Status: Abnormal   Collection Time: 05/31/16  4:02 AM  Result Value Ref Range   Troponin I 0.06 (HH) <0.03 ng/mL    Comment: CRITICAL RESULT CALLED TO, READ BACK BY AND VERIFIED WITH: STOCUM,B AT 5:25AM ON 05/31/16 BY [facility]   Comprehensive metabolic panel     Status: Abnormal   Collection Time: 05/31/16  4:02 AM  Result Value Ref Range   Sodium 136 135 - 145 mmol/L   Potassium 4.3 3.5 - 5.1 mmol/L   Chloride 108 101 - 111 mmol/L   CO2 22 22 - 32 mmol/L   Glucose, Bld 186 (H) 65 - 99 mg/dL   BUN 32 (H) 6 - 20 mg/dL   Creatinine, Ser 1.74 (H) 0.61 - 1.24 mg/dL   Calcium 8.6 (L) 8.9 - 10.3 mg/dL   Total Protein 6.2 (L) 6.5 - 8.1 g/dL   Albumin 3.5 3.5 - 5.0 g/dL   AST 40 15 - 41 U/L   ALT 30 17 - 63 U/L   Alkaline Phosphatase 34 (L) 38 - 126 U/L   Total Bilirubin 1.0 0.3 - 1.2 mg/dL   GFR calc non Af Amer 34 (L) >60 mL/min   GFR calc Af Amer 40 (L) >60 mL/min    Comment: (NOTE) The eGFR has been calculated using the CKD EPI equation. This calculation has not been validated in all clinical situations. eGFR's persistently <60 mL/min signify possible Chronic Kidney Disease.    Anion gap 6 5 - 15  Glucose, capillary     Status: Abnormal   Collection Time: 05/31/16  7:52 AM  Result Value Ref Range   Glucose-Capillary 140 (H) 65 - 99 mg/dL    ABGS No results for input(s): PHART, PO2ART, TCO2, HCO3 in the last 72 hours.  Invalid input(s): PCO2 CULTURES Recent Results (from the past  240 hour(s))  Blood Culture (routine x 2)     Status: None (Preliminary result)   Collection Time: 05/31/16 12:31 AM  Result Value Ref Range Status   Specimen Description BLOOD RIGHT ARM  Final   Special Requests BOTTLES DRAWN AEROBIC ONLY 6CC DRAWN BY RN  Final   Culture PENDING  Incomplete   Report Status PENDING  Incomplete  Blood Culture (routine x 2)     Status: None (Preliminary result)   Collection Time: 05/31/16 12:31 AM  Result Value Ref Range Status   Specimen Description BLOOD RIGHT ARM  Final   Special Requests BOTTLES DRAWN AEROBIC ONLY 6CC DRAWN BY RN  Final   Culture PENDING  Incomplete   Report Status PENDING  Incomplete   Studies/Results: Dg Chest 2 View  Result Date: 05/31/2016 CLINICAL DATA:  Chills. EXAM: CHEST  2 VIEW COMPARISON:  Chest radiograph 03/03/2016 FINDINGS: Unchanged left chest wall dual lead pacemaker. Aortic arch atherosclerosis. Cardiomediastinal contours are unchanged.  Calcified granuloma in the right lung base is unchanged. No focal airspace consolidation or pulmonary edema. No pleural effusion or pneumothorax. IMPRESSION: No active cardiopulmonary disease. Aortic atherosclerosis. Electronically Signed   By: Ulyses Jarred M.D.   On: 05/31/2016 01:10    Medications: I have reviewed the patient's current medications.  Assesment:  Active Problems:   Type 2 diabetes mellitus with renal manifestations, controlled (HCC)   Cardiac pacemaker in situ   Chronic renal insufficiency, stage III (moderate)   SIRS (systemic inflammatory response syndrome) (HCC)   Fever    Plan:  Medications reviewed Will continueIV antibiotics for now Will follow culture results Will do CBC/BMP.    LOS: 0 days   Kyle Schultz 05/31/2016, 8:25 AM

## 2016-05-31 NOTE — Progress Notes (Signed)
Pharmacy Antibiotic Note  Kyle Schultz is a 82 y.o. male admitted on 05/30/2016 with sepsis.  Pharmacy has been consulted for Vancomycin and zosyn dosing.  Plan: Vancomycin 1500mg  loading dose given the 1250mg   IV every 24 hours.  Goal trough 15-20 mcg/mL. Zosyn 3.375g IV q8h (4 hour infusion).  F/U cultures and clinical progress Vancomycin trough as indicated  Height: 6' (182.9 cm) Weight: 215 lb (97.5 kg) IBW/kg (Calculated) : 77.6  Temp (24hrs), Avg:100.1 F (37.8 C), Min:98 F (36.7 C), Max:102.8 F (39.3 C)   Recent Labs Lab 05/31/16 0015 05/31/16 0050 05/31/16 0122 05/31/16 0134 05/31/16 0402  WBC 4.0  --   --   --  5.4  CREATININE 1.86*  --   --   --  1.76*  1.74*  LATICACIDVEN  --  1.8 4.94* 1.16  --     Estimated Creatinine Clearance: 37.8 mL/min (by C-G formula based on SCr of 1.76 mg/dL (H)).    Allergies  Allergen Reactions  . Other     "some kind of fluid pill I can't take"     Antimicrobials this admission: Vancomycin 11/20 >>  Zosyn 11/20 >>   Dose adjustments this admission: n/a  Microbiology results: 11/20 BCx: pending 11/20 UCx: pending   Thank you for allowing pharmacy to be a part of this patient's care. Isac Sarna, BS Pharm D, [place] Clinical Pharmacist Pager [phone] 05/31/2016 8:51 AM

## 2016-05-31 NOTE — ED Provider Notes (Signed)
TIME SEEN: 12:04 PM  CHIEF COMPLAINT: Shivers  HPI: HPI Comments: Kyle Schultz is a 82 y.o. male with history of diabetes, hypertension, hyperlipidemia, chronic kidney disease, secondary heart block status post pacemaker placement who presents to the Emergency Department complaining of constant shivers onset ~1 hour ago. Pt reports associated chills and sneezing. His PCP is Dr. Legrand Rams, and he believes that he has had a flu shot this year but is not sure. Pt denies cough, vomiting, diarrhea, sore throat, chest pain, abdominal pain, back pain, HA, rash and dysuria.  He is unaware that he has a fever. Did not take anything prior to arrival.  Patient reports that his son lives with him that his son is out of town for the next several weeks.   ROS: See HPI Constitutional: no fever  Eyes: no drainage  ENT: no runny nose   Cardiovascular:  no chest pain  Resp: no SOB  GI: no vomiting GU: no dysuria Integumentary: no rash  Allergy: no hives  Musculoskeletal: no leg swelling  Neurological: no slurred speech ROS otherwise negative  PAST MEDICAL HISTORY/PAST SURGICAL HISTORY:  Past Medical History:  Diagnosis Date  . CKD (chronic kidney disease), stage I   . Diabetes mellitus   . First degree AV block   . Hyperlipidemia   . Hypertension   . Second degree AV block   . Sinus bradycardia     MEDICATIONS:  Prior to Admission medications   Medication Sig Start Date End Date Taking? Authorizing Provider  aspirin 81 MG tablet Take 81 mg by mouth every evening.     Historical Provider, MD  docusate sodium (COLACE) 100 MG capsule Take 100 mg by mouth daily as needed for constipation.  10/22/12   Ripley Fraise, MD  fenofibrate 160 MG tablet Take 160 mg by mouth daily.    Historical Provider, MD  glipiZIDE (GLUCOTROL) 5 MG tablet Take 10 mg by mouth 2 (two) times daily.  02/13/11   Historical Provider, MD  linagliptin (TRADJENTA) 5 MG TABS tablet Take 5 mg by mouth daily.    Historical Provider, MD   losartan (COZAAR) 100 MG tablet Take 100 mg by mouth daily.  02/13/11   Historical Provider, MD  simvastatin (ZOCOR) 20 MG tablet Take 20 mg by mouth every evening.    Historical Provider, MD    ALLERGIES:  Allergies  Allergen Reactions  . Other     "some kind of fluid pill I can't take"     SOCIAL HISTORY:  Social History  Substance Use Topics  . Smoking status: Never Smoker  . Smokeless tobacco: Never Used  . Alcohol use No    FAMILY HISTORY: Family History  Problem Relation Age of Onset  . Heart disease Father   . Cancer Sister   . Cancer Sister   . Cancer Sister   . ALS Sister     EXAM: BP 161/87 (BP Location: Right Arm)   Pulse 75   Temp 98 F (36.7 C) (Oral)   Resp 18   Ht 6' (1.829 m)   Wt 215 lb (97.5 kg)   SpO2 98%   BMI 29.16 kg/m  CONSTITUTIONAL: Alert and oriented and responds appropriately to questions. Elderly, febrile, in no significant distress HEAD: Normocephalic EYES: Conjunctivae clear, PERRL, EOMI ENT: normal nose; no rhinorrhea; moist mucous membranes; No pharyngeal erythema or petechiae, no tonsillar hypertrophy or exudate, no uvular deviation, no unilateral swelling, no trismus or drooling, no muffled voice, normal phonation, no stridor, no  dental caries present, no drainable dental abscess noted, no Ludwig's angina, tongue sits flat in the bottom of the mouth, no angioedema, no facial erythema or warmth, no facial swelling; no pain with movement of the next NECK: Supple, no meningismus, no nuchal rigidity, no LAD  CARD: Regular and tachycardic; S1 and S2 appreciated; no murmurs, no clicks, no rubs, no gallops RESP: Normal chest excursion without splinting or tachypnea; breath sounds clear and equal bilaterally; no wheezes, no rhonchi, no rales, no hypoxia or respiratory distress, speaking full sentences ABD/GI: Normal bowel sounds; non-distended; soft, non-tender, no rebound, no guarding, no peritoneal signs, no hepatosplenomegaly BACK:  The  back appears normal and is non-tender to palpation, there is no CVA tenderness EXT: Normal ROM in all joints; non-tender to palpation; no edema; normal capillary refill; no cyanosis, no calf tenderness or swelling    SKIN: Normal color for age and race; warm; no rash NEURO: Moves all extremities equally, sensation to light touch intact diffusely, cranial nerves II through XII intact, normal speech PSYCH: The patient's mood and manner are appropriate. Grooming and personal hygiene are appropriate.  MEDICAL DECISION MAKING: Patient here with fever, tachycardia. Given he meets SIRs ceriteria, will start sepsis workup. He is normotensive. We'll give IV fluids, IV antibiotics. We'll obtain labs, cultures, urine, chest x-ray and a flu swab.  ED PROGRESS: 1:25 AM  Pt's lactate came back greater than 4 but this is on blood that is over an hour old. Will redraw and repeat. Labs show chronic kidney disease which is unchanged. Otherwise labs unremarkable. Chest x-ray clear. Urine, flu swab pending.  1:35 AM  Redrawn lactate is 1.16.  2:00 AM  Pt's heart rate is still mildly elevated but he is otherwise still hemodynamically stable. Denies any complaints at this time. His chills have resolved as his fever is coming down. No obvious sign of infection at this time but blood cultures, urine culture and flu swab are pending. Urinalysis shows no obvious sign of infection. Have offered him admission to the hospital given he does meet SIRS criteria and is 82 years old versus close outpatient management with his PCP Dr. Lenard Simmer. Patient would prefer admission. Will discuss with hospitalist.    2:15 AM  Discussed patient's case with hospitalist, Dr. Darrick Meigs.  Recommend admission to telemetry, observation bed.  I will place holding orders per their request. Patient and family (if present) updated with plan. Care transferred to hospitalist service.  I reviewed all nursing notes, vitals, pertinent old records, EKGs, labs,  imaging (as available).   EKG Interpretation  Date/Time:  Monday May 31 2016 01:30:53 EST Ventricular Rate:  101 PR Interval:    QRS Duration: 140 QT Interval:  401 QTC Calculation: 543 R Axis:   -115 Text Interpretation:  Afib/flut and V-paced complexes No further rhythm analysis attempted due to paced rhythm Nonspecific IVCD with LAD No significant change since last tracing Confirmed by Shekinah Pitones,  DO, Ellanora Rayborn ([id number]) on 05/31/2016 1:33:12 AM        CRITICAL CARE Performed by: Nyra Jabs   Total critical care time: 45 minutes  Critical care time was exclusive of separately billable procedures and treating other patients.  Critical care was necessary to treat or prevent imminent or life-threatening deterioration.  Critical care was time spent personally by me on the following activities: development of treatment plan with patient and/or surrogate as well as nursing, discussions with consultants, evaluation of patient's response to treatment, examination of patient, obtaining history from patient or  surrogate, ordering and performing treatments and interventions, ordering and review of laboratory studies, ordering and review of radiographic studies, pulse oximetry and re-evaluation of patient's condition.   I personally performed the services described in this documentation, which was scribed in my presence. The recorded information has been reviewed and is accurate.    [place], DO 05/31/16 [phone]

## 2016-06-01 DIAGNOSIS — R509 Fever, unspecified: Secondary | ICD-10-CM | POA: Diagnosis not present

## 2016-06-01 DIAGNOSIS — I10 Essential (primary) hypertension: Secondary | ICD-10-CM | POA: Diagnosis not present

## 2016-06-01 DIAGNOSIS — E1165 Type 2 diabetes mellitus with hyperglycemia: Secondary | ICD-10-CM | POA: Diagnosis not present

## 2016-06-01 LAB — BASIC METABOLIC PANEL
ANION GAP: 5 (ref 5–15)
BUN: 24 mg/dL — ABNORMAL HIGH (ref 6–20)
CHLORIDE: 108 mmol/L (ref 101–111)
CO2: 24 mmol/L (ref 22–32)
Calcium: 8.8 mg/dL — ABNORMAL LOW (ref 8.9–10.3)
Creatinine, Ser: 1.67 mg/dL — ABNORMAL HIGH (ref 0.61–1.24)
GFR calc Af Amer: 42 mL/min — ABNORMAL LOW (ref >60)
GFR calc non Af Amer: 36 mL/min — ABNORMAL LOW (ref >60)
GLUCOSE: 153 mg/dL — AB (ref 65–99)
POTASSIUM: 4.6 mmol/L (ref 3.5–5.1)
Sodium: 137 mmol/L (ref 135–145)

## 2016-06-01 LAB — GLUCOSE, CAPILLARY
GLUCOSE-CAPILLARY: 261 mg/dL — AB (ref 65–99)
Glucose-Capillary: 121 mg/dL — ABNORMAL HIGH (ref 65–99)
Glucose-Capillary: 174 mg/dL — ABNORMAL HIGH (ref 65–99)
Glucose-Capillary: 133 mg/dL — ABNORMAL HIGH (ref 65–99)

## 2016-06-01 LAB — CBC
HEMATOCRIT: 33.7 % — AB (ref 39.0–52.0)
HEMOGLOBIN: 10.8 g/dL — AB (ref 13.0–17.0)
MCH: 25.7 pg — AB (ref 26.0–34.0)
MCHC: 32 g/dL (ref 30.0–36.0)
MCV: 80 fL (ref 78.0–100.0)
Platelets: 166 10*3/uL (ref 150–400)
RBC: 4.21 MIL/uL — ABNORMAL LOW (ref 4.22–5.81)
RDW: 14.5 % (ref 11.5–15.5)
WBC: 4.7 10*3/uL (ref 4.0–10.5)

## 2016-06-01 LAB — URINE CULTURE: Culture: NO GROWTH

## 2016-06-01 LAB — HEMOGLOBIN A1C
HEMOGLOBIN A1C: 8 % — AB (ref 4.8–5.6)
MEAN PLASMA GLUCOSE: 183 mg/dL

## 2016-06-01 MED ORDER — SODIUM CHLORIDE 0.9 % IV SOLN
1000.0000 mL | INTRAVENOUS | Status: DC
  Administered 2016-06-01 (×2): 1000 mL via INTRAVENOUS

## 2016-06-01 NOTE — Consult Note (Signed)
   [facility] CM Inpatient Consult   06/01/2016  Kyle Schultz 12/22/1929 [id number]   Chart review revealed patient eligible for [facility] Care Management services and post hospital discharge follow up related to a diagnosis of Diabetes. Patient was evaluated for community based chronic disease management services with [facility] care Management Program as a benefit of patient's Traditional Medicare. Met with the patient at the bedside to explain [facility] Care Management services. Patient endorses his primary care provider to be Tesfaye Fanta MD. Patient states he plans to discharge home where he resides with his grandson, but states his grandson is rarely home. Consent form signed. Patient gave [phone] as the best number to reach him. He also gave written permission to call his brother [name] at [phone] he can not be reached.Patient will receive post hospital discharge calls and be evaluated for monthly home visits. [facility] Care Management services does not interfere with or replace any services arranged by the inpatient care management team. RNCM left contact information and [facility] literature at the bedside. Made inpatient RNCM aware that [facility] will be following for care management. For additional questions please contact:     RN, BSN [facility]  Hospital Liaison  ([phone]) Business Mobile ([phone]) Toll free office  

## 2016-06-01 NOTE — Progress Notes (Signed)
Subjective: Patient is resting. His fever is subsiding. He feels better. His renal function is improving. No growth so far on the culture. Objective: Vital signs in last 24 hours: Temp:  [97.4 F (36.3 C)-98.6 F (37 C)] 97.5 F (36.4 C) (11/21 0500) Pulse Rate:  [64-73] 64 (11/21 0500) Resp:  [16-20] 16 (11/21 0500) BP: (105-135)/(54-76) 135/76 (11/21 0500) SpO2:  [98 %-100 %] 100 % (11/21 0500) Weight change:  Last BM Date: 05/31/16  Intake/Output from previous day: 11/20 0701 - 11/21 0700 In: 4015.4 [P.O.:480; I.V.:3035.4; IV Piggyback:500] Out: 3100 [Urine:2500; Stool:600]  PHYSICAL EXAM General appearance: alert and no distress Resp: clear to auscultation bilaterally Cardio: S1, S2 normal GI: soft, non-tender; bowel sounds normal; no masses,  no organomegaly Extremities: extremities normal, atraumatic, no cyanosis or edema  Lab Results:  Results for orders placed or performed during the hospital encounter of 05/30/16 (from the past 48 hour(s))  CBG monitoring, ED     Status: Abnormal   Collection Time: 05/30/16 11:52 PM  Result Value Ref Range   Glucose-Capillary 119 (H) 65 - 99 mg/dL  Comprehensive metabolic panel     Status: Abnormal   Collection Time: 05/31/16 12:15 AM  Result Value Ref Range   Sodium 141 135 - 145 mmol/L   Potassium 4.4 3.5 - 5.1 mmol/L   Chloride 106 101 - 111 mmol/L   CO2 26 22 - 32 mmol/L   Glucose, Bld 165 (H) 65 - 99 mg/dL   BUN 35 (H) 6 - 20 mg/dL   Creatinine, Ser 1.86 (H) 0.61 - 1.24 mg/dL   Calcium 9.5 8.9 - 10.3 mg/dL   Total Protein 7.4 6.5 - 8.1 g/dL   Albumin 4.3 3.5 - 5.0 g/dL   AST 45 (H) 15 - 41 U/L   ALT 31 17 - 63 U/L   Alkaline Phosphatase 41 38 - 126 U/L   Total Bilirubin 0.9 0.3 - 1.2 mg/dL   GFR calc non Af Amer 32 (L) >60 mL/min   GFR calc Af Amer 37 (L) >60 mL/min    Comment: (NOTE) The eGFR has been calculated using the CKD EPI equation. This calculation has not been validated in all clinical situations. eGFR's  persistently <60 mL/min signify possible Chronic Kidney Disease.    Anion gap 9 5 - 15  CBC WITH DIFFERENTIAL     Status: Abnormal   Collection Time: 05/31/16 12:15 AM  Result Value Ref Range   WBC 4.0 4.0 - 10.5 K/uL   RBC 4.83 4.22 - 5.81 MIL/uL   Hemoglobin 12.5 (L) 13.0 - 17.0 g/dL   HCT 38.8 (L) 39.0 - 52.0 %   MCV 80.3 78.0 - 100.0 fL   MCH 25.9 (L) 26.0 - 34.0 pg   MCHC 32.2 30.0 - 36.0 g/dL   RDW 14.8 11.5 - 15.5 %   Platelets 176 150 - 400 K/uL   Neutrophils Relative % 62 %   Neutro Abs 2.5 1.7 - 7.7 K/uL   Lymphocytes Relative 32 %   Lymphs Abs 1.3 0.7 - 4.0 K/uL   Monocytes Relative 1 %   Monocytes Absolute 0.0 (L) 0.1 - 1.0 K/uL   Eosinophils Relative 5 %   Eosinophils Absolute 0.2 0.0 - 0.7 K/uL   Basophils Relative 0 %   Basophils Absolute 0.0 0.0 - 0.1 K/uL  Urinalysis, Routine w reflex microscopic (not at [facility])     Status: None   Collection Time: 05/31/16 12:15 AM  Result Value Ref Range   Color,  Urine YELLOW YELLOW   APPearance CLEAR CLEAR   Specific Gravity, Urine 1.010 1.005 - 1.030   pH 6.5 5.0 - 8.0   Glucose, UA NEGATIVE NEGATIVE mg/dL   Hgb urine dipstick NEGATIVE NEGATIVE   Bilirubin Urine NEGATIVE NEGATIVE   Ketones, ur NEGATIVE NEGATIVE mg/dL   Protein, ur NEGATIVE NEGATIVE mg/dL   Nitrite NEGATIVE NEGATIVE   Leukocytes, UA NEGATIVE NEGATIVE    Comment: MICROSCOPIC NOT DONE ON URINES WITH NEGATIVE PROTEIN, BLOOD, LEUKOCYTES, NITRITE, OR GLUCOSE <1000 mg/dL.  Troponin I     Status: Abnormal   Collection Time: 05/31/16 12:15 AM  Result Value Ref Range   Troponin I 0.03 (HH) <0.03 ng/mL    Comment: CRITICAL RESULT CALLED TO, READ BACK BY AND VERIFIED WITH:  WALL,E @ [phone] ON 05/31/16 BY JUW   Blood Culture (routine x 2)     Status: None (Preliminary result)   Collection Time: 05/31/16 12:31 AM  Result Value Ref Range   Specimen Description BLOOD RIGHT ARM    Special Requests BOTTLES DRAWN AEROBIC ONLY 6CC DRAWN BY RN    Culture NO GROWTH < 12  HOURS    Report Status PENDING   Blood Culture (routine x 2)     Status: None (Preliminary result)   Collection Time: 05/31/16 12:31 AM  Result Value Ref Range   Specimen Description BLOOD RIGHT ARM DRAWN BY RN    Special Requests BOTTLES DRAWN AEROBIC ONLY 6CC    Culture NO GROWTH < 12 HOURS    Report Status PENDING   Lactic acid, plasma     Status: None   Collection Time: 05/31/16 12:50 AM  Result Value Ref Range   Lactic Acid, Venous 1.8 0.5 - 1.9 mmol/L  I-Stat CG4 Lactic Acid, ED  (not at  [facility])     Status: Abnormal   Collection Time: 05/31/16  1:22 AM  Result Value Ref Range   Lactic Acid, Venous 4.94 (HH) 0.5 - 1.9 mmol/L   Comment NOTIFIED PHYSICIAN   I-Stat CG4 Lactic Acid, ED     Status: None   Collection Time: 05/31/16  1:34 AM  Result Value Ref Range   Lactic Acid, Venous 1.16 0.5 - 1.9 mmol/L  Influenza panel by PCR (type A & B, H1N1)     Status: None   Collection Time: 05/31/16  1:37 AM  Result Value Ref Range   Influenza A By PCR NEGATIVE NEGATIVE   Influenza B By PCR NEGATIVE NEGATIVE    Comment: (NOTE) The Xpert Xpress Flu assay is intended as an aid in the diagnosis of  influenza and should not be used as a sole basis for treatment.  This  assay is FDA approved for nasopharyngeal swab specimens only. Nasal  washings and aspirates are unacceptable for Xpert Xpress Flu testing.   Glucose, capillary     Status: Abnormal   Collection Time: 05/31/16  3:43 AM  Result Value Ref Range   Glucose-Capillary 171 (H) 65 - 99 mg/dL  Hemoglobin A1c     Status: Abnormal   Collection Time: 05/31/16  4:02 AM  Result Value Ref Range   Hgb A1c MFr Bld 8.0 (H) 4.8 - 5.6 %    Comment: (NOTE)         Pre-diabetes: 5.7 - 6.4         Diabetes: >6.4         Glycemic control for adults with diabetes: <7.0    Mean Plasma Glucose 183 mg/dL    Comment: (  NOTE) Performed At: [facility] [place], [place] [id number] Lindon Romp MD ML:[id number]   CBC      Status: Abnormal   Collection Time: 05/31/16  4:02 AM  Result Value Ref Range   WBC 5.4 4.0 - 10.5 K/uL   RBC 4.29 4.22 - 5.81 MIL/uL   Hemoglobin 11.0 (L) 13.0 - 17.0 g/dL   HCT 33.8 (L) 39.0 - 52.0 %   MCV 78.8 78.0 - 100.0 fL   MCH 25.6 (L) 26.0 - 34.0 pg   MCHC 32.5 30.0 - 36.0 g/dL   RDW 14.8 11.5 - 15.5 %   Platelets 162 150 - 400 K/uL  Creatinine, serum     Status: Abnormal   Collection Time: 05/31/16  4:02 AM  Result Value Ref Range   Creatinine, Ser 1.76 (H) 0.61 - 1.24 mg/dL   GFR calc non Af Amer 34 (L) >60 mL/min   GFR calc Af Amer 39 (L) >60 mL/min    Comment: (NOTE) The eGFR has been calculated using the CKD EPI equation. This calculation has not been validated in all clinical situations. eGFR's persistently <60 mL/min signify possible Chronic Kidney Disease.   Troponin I     Status: Abnormal   Collection Time: 05/31/16  4:02 AM  Result Value Ref Range   Troponin I 0.06 (HH) <0.03 ng/mL    Comment: CRITICAL RESULT CALLED TO, READ BACK BY AND VERIFIED WITH: STOCUM,B AT 5:25AM ON 05/31/16 BY [facility]   Comprehensive metabolic panel     Status: Abnormal   Collection Time: 05/31/16  4:02 AM  Result Value Ref Range   Sodium 136 135 - 145 mmol/L   Potassium 4.3 3.5 - 5.1 mmol/L   Chloride 108 101 - 111 mmol/L   CO2 22 22 - 32 mmol/L   Glucose, Bld 186 (H) 65 - 99 mg/dL   BUN 32 (H) 6 - 20 mg/dL   Creatinine, Ser 1.74 (H) 0.61 - 1.24 mg/dL   Calcium 8.6 (L) 8.9 - 10.3 mg/dL   Total Protein 6.2 (L) 6.5 - 8.1 g/dL   Albumin 3.5 3.5 - 5.0 g/dL   AST 40 15 - 41 U/L   ALT 30 17 - 63 U/L   Alkaline Phosphatase 34 (L) 38 - 126 U/L   Total Bilirubin 1.0 0.3 - 1.2 mg/dL   GFR calc non Af Amer 34 (L) >60 mL/min   GFR calc Af Amer 40 (L) >60 mL/min    Comment: (NOTE) The eGFR has been calculated using the CKD EPI equation. This calculation has not been validated in all clinical situations. eGFR's persistently <60 mL/min signify possible Chronic Kidney Disease.     Anion gap 6 5 - 15  Glucose, capillary     Status: Abnormal   Collection Time: 05/31/16  7:52 AM  Result Value Ref Range   Glucose-Capillary 140 (H) 65 - 99 mg/dL  Troponin I     Status: Abnormal   Collection Time: 05/31/16  9:40 AM  Result Value Ref Range   Troponin I 0.06 (HH) <0.03 ng/mL    Comment: CRITICAL VALUE NOTED.  VALUE IS CONSISTENT WITH PREVIOUSLY REPORTED AND CALLED VALUE.  Glucose, capillary     Status: None   Collection Time: 05/31/16 11:34 AM  Result Value Ref Range   Glucose-Capillary 72 65 - 99 mg/dL  Troponin I     Status: Abnormal   Collection Time: 05/31/16  3:08 PM  Result Value Ref Range   Troponin I 0.05 (  HH) <0.03 ng/mL    Comment: CRITICAL VALUE NOTED.  VALUE IS CONSISTENT WITH PREVIOUSLY REPORTED AND CALLED VALUE.  Glucose, capillary     Status: Abnormal   Collection Time: 05/31/16  5:01 PM  Result Value Ref Range   Glucose-Capillary 129 (H) 65 - 99 mg/dL   Comment 1 Notify RN   Glucose, capillary     Status: None   Collection Time: 05/31/16  8:37 PM  Result Value Ref Range   Glucose-Capillary 92 65 - 99 mg/dL  Basic metabolic panel     Status: Abnormal   Collection Time: 06/01/16  5:07 AM  Result Value Ref Range   Sodium 137 135 - 145 mmol/L   Potassium 4.6 3.5 - 5.1 mmol/L   Chloride 108 101 - 111 mmol/L   CO2 24 22 - 32 mmol/L   Glucose, Bld 153 (H) 65 - 99 mg/dL   BUN 24 (H) 6 - 20 mg/dL   Creatinine, Ser 1.67 (H) 0.61 - 1.24 mg/dL   Calcium 8.8 (L) 8.9 - 10.3 mg/dL   GFR calc non Af Amer 36 (L) >60 mL/min   GFR calc Af Amer 42 (L) >60 mL/min    Comment: (NOTE) The eGFR has been calculated using the CKD EPI equation. This calculation has not been validated in all clinical situations. eGFR's persistently <60 mL/min signify possible Chronic Kidney Disease.    Anion gap 5 5 - 15  CBC     Status: Abnormal   Collection Time: 06/01/16  5:07 AM  Result Value Ref Range   WBC 4.7 4.0 - 10.5 K/uL   RBC 4.21 (L) 4.22 - 5.81 MIL/uL    Hemoglobin 10.8 (L) 13.0 - 17.0 g/dL   HCT 33.7 (L) 39.0 - 52.0 %   MCV 80.0 78.0 - 100.0 fL   MCH 25.7 (L) 26.0 - 34.0 pg   MCHC 32.0 30.0 - 36.0 g/dL   RDW 14.5 11.5 - 15.5 %   Platelets 166 150 - 400 K/uL  Glucose, capillary     Status: Abnormal   Collection Time: 06/01/16  7:39 AM  Result Value Ref Range   Glucose-Capillary 121 (H) 65 - 99 mg/dL    ABGS No results for input(s): PHART, PO2ART, TCO2, HCO3 in the last 72 hours.  Invalid input(s): PCO2 CULTURES Recent Results (from the past 240 hour(s))  Blood Culture (routine x 2)     Status: None (Preliminary result)   Collection Time: 05/31/16 12:31 AM  Result Value Ref Range Status   Specimen Description BLOOD RIGHT ARM  Final   Special Requests BOTTLES DRAWN AEROBIC ONLY 6CC DRAWN BY RN  Final   Culture NO GROWTH < 12 HOURS  Final   Report Status PENDING  Incomplete  Blood Culture (routine x 2)     Status: None (Preliminary result)   Collection Time: 05/31/16 12:31 AM  Result Value Ref Range Status   Specimen Description BLOOD RIGHT ARM DRAWN BY RN  Final   Special Requests BOTTLES DRAWN AEROBIC ONLY [place]  Final   Culture NO GROWTH < 12 HOURS  Final   Report Status PENDING  Incomplete   Studies/Results: Dg Chest 2 View  Result Date: 05/31/2016 CLINICAL DATA:  Chills. EXAM: CHEST  2 VIEW COMPARISON:  Chest radiograph 03/03/2016 FINDINGS: Unchanged left chest wall dual lead pacemaker. Aortic arch atherosclerosis. Cardiomediastinal contours are unchanged. Calcified granuloma in the right lung base is unchanged. No focal airspace consolidation or pulmonary edema. No pleural effusion or pneumothorax. IMPRESSION: No  active cardiopulmonary disease. Aortic atherosclerosis. Electronically Signed   By: Ulyses Jarred M.D.   On: 05/31/2016 01:10    Medications: I have reviewed the patient's current medications.  Assesment:  Active Problems:   Type 2 diabetes mellitus with renal manifestations, controlled (HCC)   Cardiac  pacemaker in situ   Chronic renal insufficiency, stage III (moderate)   SIRS (systemic inflammatory response syndrome) (HCC)   Fever    Plan:  Medications reviewed Will continueIV antibiotics for now willl adjust IV fluid Will follow culture results Will do CBC/BMP.    LOS: 1 day   Trelon Plush 06/01/2016, 8:04 AM

## 2016-06-02 ENCOUNTER — Other Ambulatory Visit: Payer: Self-pay | Admitting: *Deleted

## 2016-06-02 DIAGNOSIS — I10 Essential (primary) hypertension: Secondary | ICD-10-CM | POA: Diagnosis not present

## 2016-06-02 DIAGNOSIS — E1165 Type 2 diabetes mellitus with hyperglycemia: Secondary | ICD-10-CM | POA: Diagnosis not present

## 2016-06-02 DIAGNOSIS — R509 Fever, unspecified: Secondary | ICD-10-CM | POA: Diagnosis not present

## 2016-06-02 LAB — GLUCOSE, CAPILLARY
GLUCOSE-CAPILLARY: 118 mg/dL — AB (ref 65–99)
Glucose-Capillary: 172 mg/dL — ABNORMAL HIGH (ref 65–99)

## 2016-06-02 MED ORDER — PIPERACILLIN-TAZOBACTAM 3.375 G IVPB
INTRAVENOUS | Status: AC
  Filled 2016-06-02: qty 50

## 2016-06-02 MED ORDER — AMOXICILLIN-POT CLAVULANATE 500-125 MG PO TABS: 1.0000 | ORAL_TABLET | Freq: Three times a day (TID) | ORAL | 0 refills | Status: DC

## 2016-06-02 NOTE — Patient Outreach (Signed)
[facility] [facility]) Care Management  06/02/2016  HUTCH RHETT 1930/02/08 [id number]  [name] is a 82 year old gentleman with history of type 2 diabetes mellitus with renal manifestations, cardiac pacemaker in situ, chronic renal insufficiency, stage III (moderate), and was recently admitted to the hospital with fever and chills and was treated for for SIRS (systemic inflammatory response syndrome). He was discharged to home this morning.   Mr. Nester was referred to [facility] for transition of care assessments and care coordination needs. I was unable to reach Mr. Capri at home but will reach out to him again on Friday.    [place] Management  [phone]

## 2016-06-02 NOTE — Progress Notes (Signed)
Discharge instructions given on medications and follow up visits patient verbalized understanding. Prescriptions sent to Pharmacy of choice documented on AVS. Accompanied by staff to an awaiting vehicle.

## 2016-06-02 NOTE — Care Management Important Message (Signed)
Important Message  Patient Details  Name: ANIK WESCH MRN: [id number] Date of Birth: 03/06/1930   Medicare Important Message Given:  Yes    Sherald Barge, RN 06/02/2016, 8:41 AM

## 2016-06-02 NOTE — Care Management Note (Signed)
Case Management Note  Patient Details  Name: Kyle Schultz MRN: [id number] Date of Birth: 19-Jun-1930  Expected Discharge Date:     06/02/2016             Expected Discharge Plan:  Home/Self Care  In-House Referral:  NA  Discharge planning Services  CM Consult  Status of Service:  Completed, signed off   Additional Comments: Pt discharging home today with self care. No CM needs.   Sherald Barge, RN 06/02/2016, 8:41 AM

## 2016-06-02 NOTE — Discharge Summary (Signed)
Physician Discharge Summary  Patient ID: Kyle Schultz MRN: [id number] DOB/AGE: 80-Apr-1931 80 y.o. Primary Care Physician:Othello Dickenson, MD Admit date: 05/30/2016 Discharge date: 06/02/2016    Discharge Diagnoses:   Active Problems:   Type 2 diabetes mellitus with renal manifestations, controlled (HCC)   Cardiac pacemaker in situ   Chronic renal insufficiency, stage III (moderate)   SIRS (systemic inflammatory response syndrome) (HCC)   Fever     Medication List    TAKE these medications   amoxicillin-clavulanate 500-125 MG tablet Commonly known as:  AUGMENTIN Take 1 tablet (500 mg total) by mouth 3 (three) times daily.   aspirin 81 MG tablet Take 81 mg by mouth every evening.   fenofibrate 160 MG tablet Take 160 mg by mouth daily.   glipiZIDE 5 MG tablet Commonly known as:  GLUCOTROL Take 10 mg by mouth 2 (two) times daily.   losartan 100 MG tablet Commonly known as:  COZAAR Take 100 mg by mouth daily.   simvastatin 20 MG tablet Commonly known as:  ZOCOR Take 20 mg by mouth every evening.   TRADJENTA 5 MG Tabs tablet Generic drug:  linagliptin Take 5 mg by mouth daily.       Discharged Condition: improved    Consults: none  Significant Diagnostic Studies: Dg Chest 2 View  Result Date: 05/31/2016 CLINICAL DATA:  Chills. EXAM: CHEST  2 VIEW COMPARISON:  Chest radiograph 03/03/2016 FINDINGS: Unchanged left chest wall dual lead pacemaker. Aortic arch atherosclerosis. Cardiomediastinal contours are unchanged. Calcified granuloma in the right lung base is unchanged. No focal airspace consolidation or pulmonary edema. No pleural effusion or pneumothorax. IMPRESSION: No active cardiopulmonary disease. Aortic atherosclerosis. Electronically Signed   By: Ulyses Jarred M.D.   On: 05/31/2016 01:10    Lab Results: Basic Metabolic Panel:  Recent Labs  05/31/16 0402 06/01/16 0507  NA 136 137  K 4.3 4.6  CL 108 108  CO2 22 24  GLUCOSE 186* 153*  BUN 32*  24*  CREATININE 1.76*  1.74* 1.67*  CALCIUM 8.6* 8.8*   Liver Function Tests:  Recent Labs  05/31/16 0015 05/31/16 0402  AST 45* 40  ALT 31 30  ALKPHOS 41 34*  BILITOT 0.9 1.0  PROT 7.4 6.2*  ALBUMIN 4.3 3.5     CBC:  Recent Labs  05/31/16 0015 05/31/16 0402 06/01/16 0507  WBC 4.0 5.4 4.7  NEUTROABS 2.5  --   --   HGB 12.5* 11.0* 10.8*  HCT 38.8* 33.8* 33.7*  MCV 80.3 78.8 80.0  PLT 176 162 166    Recent Results (from the past 240 hour(s))  Urine culture     Status: None   Collection Time: 05/31/16 12:15 AM  Result Value Ref Range Status   Specimen Description URINE, CLEAN CATCH  Final   Special Requests NONE  Final   Culture NO GROWTH Performed at [facility]   Final   Report Status 06/01/2016 FINAL  Final  Blood Culture (routine x 2)     Status: None (Preliminary result)   Collection Time: 05/31/16 12:31 AM  Result Value Ref Range Status   Specimen Description BLOOD RIGHT ARM  Final   Special Requests BOTTLES DRAWN AEROBIC ONLY 6CC DRAWN BY RN  Final   Culture NO GROWTH 1 DAY  Final   Report Status PENDING  Incomplete  Blood Culture (routine x 2)     Status: None (Preliminary result)   Collection Time: 05/31/16 12:31 AM  Result Value Ref Range Status  Specimen Description BLOOD RIGHT ARM DRAWN BY RN  Final   Special Requests BOTTLES DRAWN AEROBIC ONLY [place]  Final   Culture NO GROWTH 1 DAY  Final   Report Status PENDING  Incomplete     Hospital Course:   This is an 82 years old male with history of multiple medical illnesses was admitted due to fever, chills and shortness of breath. Patient was empirically started on combinations of IV antibiotics. His cultures remained negative. Patient improved and his fever resolved. He is being admitted on oral antibiotics empirically. He will followed in the office in a week.  Discharge Exam: Blood pressure (!) 129/97, pulse 74, temperature 97.8 F (36.6 C), temperature source Oral, resp. rate 16, height  6' (1.829 m), weight 97.5 kg (215 lb), SpO2 98 %.    Disposition:  home  Discharge Instructions    AMB Referral to [facility]    Complete by:  As directed    Reason for consult:  post hospital discharge follow up   Diagnoses of:  Diabetes   Expected date of contact:  1-3 days (reserved for hospital discharges)   Please assign patient for community nurse to engage for transition of care calls and evaluate for monthly home visits. For questions please contact:   Janci Minor RN, [facility] Liaison [phone])      Follow-up Information    Herold Salguero, MD Follow up in 1 week(s).   Specialty:  Internal Medicine Contact information: [place] [address] [phone]           Signed: Rosita Fire   06/02/2016, 8:15 AM

## 2016-06-04 ENCOUNTER — Encounter: Payer: Self-pay | Admitting: *Deleted

## 2016-06-04 ENCOUNTER — Other Ambulatory Visit: Payer: Self-pay | Admitting: *Deleted

## 2016-06-04 NOTE — Patient Outreach (Signed)
[facility]) Care Management  06/04/2016  VICENT FEBLES 04-13-1930 [id number]  Mr. TRINO HIGINBOTHAM is a 82 year old gentleman with history of type 2 diabetes mellitus with renal manifestations, cardiac pacemaker in situ, chronic renal insufficiency, stage III (moderate), and was recently admitted to the hospital with fever and chills and was treated for for SIRS (systemic inflammatory response syndrome). He was discharged to home this morning.   Mr. Sizemore was referred to [facility] for transition of care assessments and care coordination needs.Patient was recently discharged from hospital and all medications have been reviewed.  Mr. Reggio agrees to telephone follow up over the next several weeks in response to his recent hospitalization.   Plan: I will reach out to Mr. Capozzi by phone next week. Mr. Hemmelgarn will see Dr. Legrand Rams in the office as scheduled on 06/15/16.    [place] Management  [phone]

## 2016-06-05 LAB — CULTURE, BLOOD (ROUTINE X 2)
CULTURE: NO GROWTH
CULTURE: NO GROWTH

## 2016-06-08 ENCOUNTER — Other Ambulatory Visit: Payer: Self-pay | Admitting: *Deleted

## 2016-06-08 NOTE — Patient Outreach (Signed)
[facility]) Care Management  06/08/2016  Kyle Schultz 10-Feb-1930 [id number]  Unable to reach [name] this afternoon for week #2 transition of care outreach.   Plan: [name] is scheduled for transition of care outreach calls over the next few weeks s/p his recent hospitalization for SIRS. We will continue to provide transitional care support to [name] and if he wishes face to face follow up.    [place] Management  [phone]

## 2016-06-15 ENCOUNTER — Other Ambulatory Visit: Payer: Self-pay | Admitting: *Deleted

## 2016-06-15 DIAGNOSIS — R509 Fever, unspecified: Secondary | ICD-10-CM | POA: Diagnosis not present

## 2016-06-15 DIAGNOSIS — I10 Essential (primary) hypertension: Secondary | ICD-10-CM | POA: Diagnosis not present

## 2016-06-15 DIAGNOSIS — E784 Other hyperlipidemia: Secondary | ICD-10-CM | POA: Diagnosis not present

## 2016-06-15 DIAGNOSIS — E1165 Type 2 diabetes mellitus with hyperglycemia: Secondary | ICD-10-CM | POA: Diagnosis not present

## 2016-06-15 NOTE — Patient Outreach (Signed)
Transition of care call. Kyle Schultz reports he is doing well. He has finished his antibiotic Rx. He saw Dr. Legrand Rams today and he said he was doing fine. Kyle Schultz has all his meds and is taking them as ordered. He knows he can call the nurse line or his primary care manager, Kyle Schultz for any questions or problems.  [name] [facility] [phone]

## 2016-06-22 ENCOUNTER — Other Ambulatory Visit: Payer: Self-pay | Admitting: *Deleted

## 2016-06-22 NOTE — Patient Outreach (Signed)
[place] [place] [facility]) Care Management  06/22/2016  Kyle Schultz 1930/02/10 [id number]  I was unable to reach Kyle Schultz by phone today or leave a message.   Plan: I will reach out to Kyle Schultz again on Thursday.    [place] Management  [phone]

## 2016-06-24 ENCOUNTER — Other Ambulatory Visit: Payer: Self-pay | Admitting: *Deleted

## 2016-06-24 NOTE — Patient Outreach (Signed)
[place] [facility]) Care Management  06/24/2016  Kyle Schultz 05/29/1930 [id number]  I was unable to reach [name] by phone today but was able to leave a message requesting a return call.   Plan: I will reach out to [name] again next week by phone.    [place] Management  [phone]

## 2016-06-29 ENCOUNTER — Other Ambulatory Visit: Payer: Self-pay | Admitting: *Deleted

## 2016-07-07 ENCOUNTER — Other Ambulatory Visit: Payer: Self-pay | Admitting: *Deleted

## 2016-07-07 ENCOUNTER — Encounter: Payer: Self-pay | Admitting: *Deleted

## 2016-07-07 NOTE — Patient Outreach (Signed)
[facility]) Care Management  07/07/2016  Kyle Schultz 1930/06/14 [id number]  I have been unable to reach [name] by phone for assistance with transition of care services more than once since his hospital discharge.   Plan: I will send a letter to [name] at home offering our assistance and will close his case if I have not heard from him in the next 10 days.  We are happy to provide assistance to [name] for his case management needs should he wish to engage.    [place] Management  [phone]

## 2016-07-16 ENCOUNTER — Encounter: Payer: Self-pay | Admitting: *Deleted

## 2016-07-16 ENCOUNTER — Other Ambulatory Visit: Payer: Self-pay | Admitting: *Deleted

## 2016-07-16 NOTE — Patient Outreach (Signed)
[facility]) Care Management  07/16/2016  HOGAN HOOBLER 08-20-1929 [id number]  We have been unable to maintain contact with Mr. Gopaul. I will close his case and notify his primary care provider. We are happy to provide assistance to Mr. Gulino at any time in the future should he be in need and willing to engage.    [place] Management  [phone]

## 2016-07-22 DIAGNOSIS — E784 Other hyperlipidemia: Secondary | ICD-10-CM | POA: Diagnosis not present

## 2016-07-22 DIAGNOSIS — I10 Essential (primary) hypertension: Secondary | ICD-10-CM | POA: Diagnosis not present

## 2016-07-22 DIAGNOSIS — Z6830 Body mass index (BMI) 30.0-30.9, adult: Secondary | ICD-10-CM | POA: Diagnosis not present

## 2016-07-22 DIAGNOSIS — E1165 Type 2 diabetes mellitus with hyperglycemia: Secondary | ICD-10-CM | POA: Diagnosis not present

## 2016-07-30 DIAGNOSIS — E1142 Type 2 diabetes mellitus with diabetic polyneuropathy: Secondary | ICD-10-CM | POA: Diagnosis not present

## 2016-07-30 DIAGNOSIS — B351 Tinea unguium: Secondary | ICD-10-CM | POA: Diagnosis not present

## 2016-10-05 DIAGNOSIS — R7309 Other abnormal glucose: Secondary | ICD-10-CM | POA: Diagnosis not present

## 2016-10-05 DIAGNOSIS — Z1389 Encounter for screening for other disorder: Secondary | ICD-10-CM | POA: Diagnosis not present

## 2016-10-05 DIAGNOSIS — I10 Essential (primary) hypertension: Secondary | ICD-10-CM | POA: Diagnosis not present

## 2016-10-05 DIAGNOSIS — E119 Type 2 diabetes mellitus without complications: Secondary | ICD-10-CM | POA: Diagnosis not present

## 2016-10-05 DIAGNOSIS — E784 Other hyperlipidemia: Secondary | ICD-10-CM | POA: Diagnosis not present

## 2016-10-05 DIAGNOSIS — Z0000 Encounter for general adult medical examination without abnormal findings: Secondary | ICD-10-CM | POA: Diagnosis not present

## 2016-10-05 DIAGNOSIS — E1165 Type 2 diabetes mellitus with hyperglycemia: Secondary | ICD-10-CM | POA: Diagnosis not present

## 2016-10-15 DIAGNOSIS — E1142 Type 2 diabetes mellitus with diabetic polyneuropathy: Secondary | ICD-10-CM | POA: Diagnosis not present

## 2016-10-15 DIAGNOSIS — B351 Tinea unguium: Secondary | ICD-10-CM | POA: Diagnosis not present

## 2016-10-19 ENCOUNTER — Encounter (HOSPITAL_COMMUNITY): Payer: Self-pay | Admitting: Emergency Medicine

## 2016-10-19 ENCOUNTER — Emergency Department (HOSPITAL_COMMUNITY): Payer: Medicare Other

## 2016-10-19 ENCOUNTER — Emergency Department (HOSPITAL_COMMUNITY)
Admission: EM | Admit: 2016-10-19 | Discharge: 2016-10-20 | Disposition: A | Discharge status: Home / Self Care | Payer: Medicare Other | Attending: Emergency Medicine | Admitting: Emergency Medicine

## 2016-10-19 DIAGNOSIS — R05 Cough: Secondary | ICD-10-CM | POA: Diagnosis not present

## 2016-10-19 DIAGNOSIS — R6883 Chills (without fever): Secondary | ICD-10-CM | POA: Diagnosis not present

## 2016-10-19 DIAGNOSIS — Z79899 Other long term (current) drug therapy: Secondary | ICD-10-CM | POA: Diagnosis not present

## 2016-10-19 DIAGNOSIS — I129 Hypertensive chronic kidney disease with stage 1 through stage 4 chronic kidney disease, or unspecified chronic kidney disease: Secondary | ICD-10-CM | POA: Insufficient documentation

## 2016-10-19 DIAGNOSIS — Z7982 Long term (current) use of aspirin: Secondary | ICD-10-CM | POA: Diagnosis not present

## 2016-10-19 DIAGNOSIS — E1122 Type 2 diabetes mellitus with diabetic chronic kidney disease: Secondary | ICD-10-CM | POA: Insufficient documentation

## 2016-10-19 DIAGNOSIS — Z7984 Long term (current) use of oral hypoglycemic drugs: Secondary | ICD-10-CM | POA: Diagnosis not present

## 2016-10-19 DIAGNOSIS — R0602 Shortness of breath: Secondary | ICD-10-CM | POA: Diagnosis not present

## 2016-10-19 DIAGNOSIS — Z950 Presence of cardiac pacemaker: Secondary | ICD-10-CM | POA: Insufficient documentation

## 2016-10-19 DIAGNOSIS — N183 Chronic kidney disease, stage 3 (moderate): Secondary | ICD-10-CM | POA: Insufficient documentation

## 2016-10-19 DIAGNOSIS — R079 Chest pain, unspecified: Secondary | ICD-10-CM | POA: Diagnosis not present

## 2016-10-19 LAB — COMPREHENSIVE METABOLIC PANEL
ALT: 26 U/L (ref 17–63)
AST: 39 U/L (ref 15–41)
Albumin: 3.8 g/dL (ref 3.5–5.0)
Alkaline Phosphatase: 44 U/L (ref 38–126)
Anion gap: 8 (ref 5–15)
BUN: 32 mg/dL — ABNORMAL HIGH (ref 6–20)
CO2: 22 mmol/L (ref 22–32)
Calcium: 9 mg/dL (ref 8.9–10.3)
Chloride: 101 mmol/L (ref 101–111)
Creatinine, Ser: 1.43 mg/dL — ABNORMAL HIGH (ref 0.61–1.24)
GFR calc Af Amer: 50 mL/min — ABNORMAL LOW (ref >60)
GFR calc non Af Amer: 43 mL/min — ABNORMAL LOW (ref >60)
Glucose, Bld: 220 mg/dL — ABNORMAL HIGH (ref 65–99)
Potassium: 4 mmol/L (ref 3.5–5.1)
Sodium: 131 mmol/L — ABNORMAL LOW (ref 135–145)
Total Bilirubin: 0.7 mg/dL (ref 0.3–1.2)
Total Protein: 6.7 g/dL (ref 6.5–8.1)

## 2016-10-19 LAB — URINALYSIS, ROUTINE W REFLEX MICROSCOPIC
BILIRUBIN URINE: NEGATIVE
GLUCOSE, UA: 150 mg/dL — AB
HGB URINE DIPSTICK: NEGATIVE
Ketones, ur: NEGATIVE mg/dL
Leukocytes, UA: NEGATIVE
Nitrite: NEGATIVE
Protein, ur: NEGATIVE mg/dL
SPECIFIC GRAVITY, URINE: 1.015 (ref 1.005–1.030)
pH: 5 (ref 5.0–8.0)

## 2016-10-19 LAB — CBC WITH DIFFERENTIAL/PLATELET
Basophils Absolute: 0 10*3/uL (ref 0.0–0.1)
Basophils Relative: 0 %
Eosinophils Absolute: 0.1 10*3/uL (ref 0.0–0.7)
Eosinophils Relative: 2 %
HCT: 35.6 % — ABNORMAL LOW (ref 39.0–52.0)
Hemoglobin: 11.6 g/dL — ABNORMAL LOW (ref 13.0–17.0)
Lymphocytes Relative: 8 %
Lymphs Abs: 0.2 10*3/uL — ABNORMAL LOW (ref 0.7–4.0)
MCH: 25.6 pg — ABNORMAL LOW (ref 26.0–34.0)
MCHC: 32.6 g/dL (ref 30.0–36.0)
MCV: 78.6 fL (ref 78.0–100.0)
Monocytes Absolute: 0.1 10*3/uL (ref 0.1–1.0)
Monocytes Relative: 2 %
Neutro Abs: 2.4 10*3/uL (ref 1.7–7.7)
Neutrophils Relative %: 88 %
Platelets: 167 10*3/uL (ref 150–400)
RBC: 4.53 MIL/uL (ref 4.22–5.81)
RDW: 14.4 % (ref 11.5–15.5)
WBC: 2.7 10*3/uL — ABNORMAL LOW (ref 4.0–10.5)

## 2016-10-19 LAB — PROTIME-INR
INR: 1.09
Prothrombin Time: 14.1 seconds (ref 11.4–15.2)

## 2016-10-19 LAB — I-STAT TROPONIN, ED: Troponin i, poc: 0.02 ng/mL (ref 0.00–0.08)

## 2016-10-19 LAB — I-STAT CG4 LACTIC ACID, ED: LACTIC ACID, VENOUS: 1.47 mmol/L (ref 0.5–1.9)

## 2016-10-19 NOTE — ED Triage Notes (Signed)
Pt states he started having chills around 1800 and denies any pain or any other symptoms.

## 2016-10-19 NOTE — ED Provider Notes (Signed)
Emergency Department Provider Note   I have reviewed the triage vital signs and the nursing notes.  By signing my name below, I, Macon Large, attest that this documentation has been prepared under the direction and in the presence of Margette Fast, MD. Electronically Signed: Macon Large, ED Scribe. 10/19/16. 10:08 PM.  HISTORY  Chief Complaint Chills   HPI Kyle Schultz is a 84 y.o. male with PMHx of DM, hyperlipidemia, HTN who presents to the Emergency Department complaining of moderate, intermittent, episodic upper extremity shaking that occurred at ~8pm tonight. Pt notes throughout his episodes, he will have shaking in his bilaterals arms. He reports feeling nervous throughout the duration of the episodes. Pt states his shaking will resolve spontaneously, with no associated symptoms. No alleviating factors noted. Pt denies any pain. he reports having h/o of similar episodes in the past. He denies nausea, vomiting.   Past Medical History:  Diagnosis Date  . CKD (chronic kidney disease), stage I   . Diabetes mellitus   . First degree AV block   . Hyperlipidemia   . Hypertension   . Second degree AV block   . Sinus bradycardia     Patient Active Problem List   Diagnosis Date Noted  . SIRS (systemic inflammatory response syndrome) ([place]) 05/31/2016  . Fever 05/31/2016  . Chronic renal insufficiency, stage III (moderate) 06/26/2015  . Palpitations 06/26/2015  . SINOATRIAL NODE DYSFUNCTION 08/01/2009  . Cardiac pacemaker in situ 08/01/2009  . Type 2 diabetes mellitus with renal manifestations, controlled ([place]) 03/10/2009  . Dyslipidemia 03/10/2009  . Essential hypertension 03/10/2009    Past Surgical History:  Procedure Laterality Date  . COLONOSCOPY    . colonscopy    . HEMORRHOID SURGERY    . PACEMAKER INSERTION  2010   [facility]  . UMBILICAL HERNIA REPAIR  04/2008    Current Outpatient Rx  . Order #: [id number] Class: Historical Med  . Order #:  [id number] Class: Historical Med  . Order #: [id number] Class: Historical Med  . Order #: [id number] Class: Historical Med  . Order #: [id number] Class: Historical Med  . Order #: [id number] Class: Historical Med    Allergies Other  Family History  Problem Relation Age of Onset  . Heart disease Father   . Cancer Sister   . Cancer Sister   . Cancer Sister   . ALS Sister     Social History Social History  Substance Use Topics  . Smoking status: Never Smoker  . Smokeless tobacco: Never Used  . Alcohol use No    Review of Systems  Constitutional: No fever. Positive shaking episodes.  Eyes: No visual changes. ENT: No sore throat. Cardiovascular: Denies chest pain. Respiratory: Denies shortness of breath. Gastrointestinal: No abdominal pain.  No nausea, no vomiting.  No diarrhea.  No constipation. Genitourinary: Negative for dysuria. Musculoskeletal: Negative for back pain. Skin: Negative for rash. Neurological: Negative for headaches, focal weakness or numbness.  10-point ROS otherwise negative.  ____________________________________________   PHYSICAL EXAM:  VITAL SIGNS: ED Triage Vitals [10/19/16 2114]  Enc Vitals Group     BP (!) 151/105     Pulse Rate (!) 117     Resp 18     Temp 97.7 F (36.5 C)     Temp Source Oral     SpO2 100 %     Weight 215 lb (97.5 kg)     Height 6' (1.829 m)     Pain Score 0   Constitutional: Alert and oriented. Well appearing  and in no acute distress. Eyes: Conjunctivae are normal.  Head: Atraumatic. Nose: No congestion/rhinnorhea. Mouth/Throat: Mucous membranes are moist. Neck: No stridor.   Cardiovascular: Normal rate, regular rhythm. Good peripheral circulation. Grossly normal heart sounds.   Respiratory: Normal respiratory effort.  No retractions. Lungs CTAB. Gastrointestinal: Soft and nontender. No distention.  Musculoskeletal: No lower extremity tenderness. Trace LE edema. No gross deformities of extremities. Neurologic:  Normal  speech and language. No gross focal neurologic deficits are appreciated.  Skin:  Skin is warm, dry and intact. No rash noted.  ____________________________________________   LABS (all labs ordered are listed, but only abnormal results are displayed)  Labs Reviewed  COMPREHENSIVE METABOLIC PANEL - Abnormal; Notable for the following:       Result Value   Sodium 131 (*)    Glucose, Bld 220 (*)    BUN 32 (*)    Creatinine, Ser 1.43 (*)    GFR calc non Af Amer 43 (*)    GFR calc Af Amer 50 (*)    All other components within normal limits  CBC WITH DIFFERENTIAL/PLATELET - Abnormal; Notable for the following:    WBC 2.7 (*)    Hemoglobin 11.6 (*)    HCT 35.6 (*)    MCH 25.6 (*)    Lymphs Abs 0.2 (*)    All other components within normal limits  URINALYSIS, ROUTINE W REFLEX MICROSCOPIC - Abnormal; Notable for the following:    Glucose, UA 150 (*)    All other components within normal limits  CULTURE, BLOOD (ROUTINE X 2)  CULTURE, BLOOD (ROUTINE X 2)  URINE CULTURE  PROTIME-INR  I-STAT CG4 LACTIC ACID, ED  I-STAT TROPOININ, ED   ____________________________________________  EKG   EKG Interpretation  Date/Time:  Tuesday October 19 2016 21:53:41 EDT Ventricular Rate:  101 PR Interval:    QRS Duration: 137 QT Interval:  354 QTC Calculation: 459 R Axis:   160 Text Interpretation:  Afib/flutter and ventricular-paced rhythm No further analysis attempted due to paced rhythm Similar to prior. No STEMI.  Confirmed by Talbert Trembath MD, Ellen Mayol [phone]) on 10/19/2016 9:57:56 PM       ____________________________________________  RADIOLOGY  Dg Chest 2 View  Result Date: 10/19/2016 CLINICAL DATA:  Shortness of breath and cough. Bilateral chest pain and chills. Initial encounter. EXAM: CHEST  2 VIEW COMPARISON:  Chest radiograph performed 05/31/2016 FINDINGS: The lungs are well-aerated. Mild vascular congestion is noted. Mild interstitial prominence could reflect mild interstitial edema. A  calcified granuloma is noted overlying the right lung base. There is no evidence of pleural effusion or pneumothorax. The heart is normal in size; a pacemaker is noted at the left chest wall, with leads ending at the right atrium and right ventricle. No acute osseous abnormalities are seen. IMPRESSION: Mild vascular congestion. Mild interstitial prominence could reflect mild interstitial edema. Electronically Signed   By: Garald Balding M.D.   On: 10/19/2016 22:20    ____________________________________________   PROCEDURES  Procedure(s) performed:   Procedures  None ____________________________________________   INITIAL IMPRESSION / ASSESSMENT AND PLAN / ED COURSE  Pertinent labs & imaging results that were available during my care of the patient were reviewed by me and considered in my medical decision making (see chart for details).  Patient with shaking episode prior to arrival. He remained awake and alert so low suspicion for seizure. Possibly rigors but no other signs/symptoms of infection. Exam is unremarkable. No SOB, hypoxemia, Positive mild cough.   Labs are unremarkable. No signs  of sepsis of other serious infection. Patient is awake and alert. Unclear is this shaking episode was rigors. Very low suspicion for seizure activity or other movement disorder. Plan for PCP follow up. Cultures drawn in the ED which we will follow.  At this time, I do not feel there is any life-threatening condition present. I have reviewed and discussed all results (EKG, imaging, lab, urine as appropriate), exam findings with patient. I have reviewed nursing notes and appropriate previous records.  I feel the patient is safe to be discharged home without further emergent workup. Discussed usual and customary return precautions. Patient and family (if present) verbalize understanding and are comfortable with this plan.  Patient will follow-up with their primary care provider. If they do not have a primary  care provider, information for follow-up has been provided to them. All questions have been answered.  ____________________________________________  FINAL CLINICAL IMPRESSION(S) / ED DIAGNOSES  Final diagnoses:  Chills     MEDICATIONS GIVEN DURING THIS VISIT:  None  NEW OUTPATIENT MEDICATIONS STARTED DURING THIS VISIT:  None   Note:  This document was prepared using Dragon voice recognition software and may include unintentional dictation errors.  [name], MD Emergency Medicine   I personally performed the services described in this documentation, which was scribed in my presence. The recorded information has been reviewed and is accurate.       Margette Fast, MD 10/20/16 1023

## 2016-10-19 NOTE — Discharge Instructions (Signed)
You were seen in the ED today with chills but no evidence of infection was found. Follow up with your Family Doctor in the coming 1-2 days. Return with any chest pain, difficulty breathing, fever, or worsening chills.

## 2016-10-20 DIAGNOSIS — E1165 Type 2 diabetes mellitus with hyperglycemia: Secondary | ICD-10-CM | POA: Diagnosis not present

## 2016-10-20 DIAGNOSIS — I10 Essential (primary) hypertension: Secondary | ICD-10-CM | POA: Diagnosis not present

## 2016-10-21 LAB — URINE CULTURE: Culture: <10000 — AB

## 2016-10-24 LAB — CULTURE, BLOOD (ROUTINE X 2)
Culture: NO GROWTH
Culture: NO GROWTH
SPECIAL REQUESTS: ADEQUATE
Special Requests: ADEQUATE

## 2016-11-15 ENCOUNTER — Encounter: Payer: Self-pay | Admitting: Internal Medicine

## 2016-11-15 ENCOUNTER — Ambulatory Visit (INDEPENDENT_AMBULATORY_CARE_PROVIDER_SITE_OTHER): Payer: Medicare Other | Admitting: Internal Medicine

## 2016-11-15 VITALS — BP 140/80 | HR 79 | Ht 72.0 in | Wt 209.0 lb

## 2016-11-15 DIAGNOSIS — I442 Atrioventricular block, complete: Secondary | ICD-10-CM

## 2016-11-15 DIAGNOSIS — I10 Essential (primary) hypertension: Secondary | ICD-10-CM | POA: Diagnosis not present

## 2016-11-15 LAB — CUP PACEART INCLINIC DEVICE CHECK
Brady Statistic RV Percent Paced: 98 %
Date Time Interrogation Session: 20180507103657
Implantable Lead Implant Date: 20100715
Implantable Lead Location: 753859
Implantable Lead Location: 753860
Implantable Pulse Generator Implant Date: 20100715
Lead Channel Impedance Value: 337.5 Ohm
Lead Channel Pacing Threshold Amplitude: 0.5 V
Lead Channel Pacing Threshold Amplitude: 0.625 V
Lead Channel Pacing Threshold Pulse Width: 0.4 ms
Lead Channel Sensing Intrinsic Amplitude: 3.3 mV
Lead Channel Sensing Intrinsic Amplitude: 6.9 mV
Lead Channel Setting Pacing Amplitude: 0.875
Lead Channel Setting Pacing Pulse Width: 0.4 ms
MDC IDC LEAD IMPLANT DT: 20100715
MDC IDC MSMT BATTERY VOLTAGE: 2.86 V
MDC IDC MSMT LEADCHNL RA PACING THRESHOLD PULSEWIDTH: 0.4 ms
MDC IDC MSMT LEADCHNL RV IMPEDANCE VALUE: 412.5 Ohm
MDC IDC PG SERIAL: 2285839
MDC IDC SET LEADCHNL RA PACING AMPLITUDE: 1.5 V
MDC IDC SET LEADCHNL RV SENSING SENSITIVITY: 2 mV
MDC IDC STAT BRADY RA PERCENT PACED: 4.5 %
Pulse Gen Model: 2110

## 2016-11-15 NOTE — Patient Instructions (Signed)
Your physician wants you to follow-up in: 1 year Dr Lovena Le Dennis Bast will receive a reminder letter in the mail two months in advance. If you don't receive a letter, please call our office to schedule the follow-up appointment.     Remote monitoring is used to monitor your Pacemaker of ICD from home. This monitoring reduces the number of office visits required to check your device to one time per year. It allows [place] to keep an eye on the functioning of your device to ensure it is working properly. You are scheduled for a device check from home on 02/14/17. You may send your transmission at any time that day. If you have a wireless device, the transmission will be sent automatically. After your physician reviews your transmission, you will receive a postcard with your next transmission date.      Your physician recommends that you continue on your current medications as directed. Please refer to the Current Medication list given to you today.   If you need a refill on your cardiac medications before your next appointment, please call your pharmacy.       Thank you for choosing [facility] !

## 2016-11-15 NOTE — Progress Notes (Signed)
HPI [name] returns today for followup. He is a pleasant 83 yo man with a h/o HTN, symptomatic bradycardia, s/p PPM insertion. In the interim he has done well. He denies chest pain, sob, or syncope. No edema. He admits to a sedentary lifestyle. He has not been in the hospital. He has minimal edema.  Allergies  Allergen Reactions  . Other     "some kind of fluid pill I can't take"     Current Outpatient Prescriptions  Medication Sig Dispense Refill  . aspirin 81 MG tablet Take 81 mg by mouth every evening.     . fenofibrate 160 MG tablet Take 160 mg by mouth daily.    Marland Kitchen glipiZIDE (GLUCOTROL) 10 MG tablet Take 10 mg by mouth 2 (two) times daily.     Marland Kitchen linagliptin (TRADJENTA) 5 MG TABS tablet Take 5 mg by mouth daily.    Marland Kitchen losartan (COZAAR) 100 MG tablet Take 100 mg by mouth daily.     . simvastatin (ZOCOR) 20 MG tablet Take 20 mg by mouth every evening.     No current facility-administered medications for this visit.      Past Medical History:  Diagnosis Date  . CKD (chronic kidney disease), stage I   . Diabetes mellitus   . First degree AV block   . Hyperlipidemia   . Hypertension   . Second degree AV block   . Sinus bradycardia     ROS:   All systems reviewed and negative except as noted in the HPI.   Past Surgical History:  Procedure Laterality Date  . COLONOSCOPY    . colonscopy    . HEMORRHOID SURGERY    . PACEMAKER INSERTION  2010   [facility]  . UMBILICAL HERNIA REPAIR  04/2008     Family History  Problem Relation Age of Onset  . Heart disease Father   . Cancer Sister   . Cancer Sister   . Cancer Sister   . ALS Sister      Social History   Social History  . Marital status: Widowed    Spouse name: N/A  . Number of children: N/A  . Years of education: N/A   Occupational History  . Retired, Optometrist [employer]    Social History Main Topics  . Smoking status: Never Smoker  . Smokeless tobacco: Never Used  . Alcohol use No  . Drug use: No  .  Sexual activity: No   Other Topics Concern  . Not on file   Social History Narrative   Widowed   1 child   Walks daily     BP 140/80   Pulse 79   Ht 6' (1.829 m)   Wt 209 lb (94.8 kg)   SpO2 99%   BMI 28.35 kg/m   Physical Exam:  Well appearing 83 yo man, NAD HEENT: Unremarkable Neck:  6 cm JVD, no thyromegally Lungs:  Clear with no wheezes, rales, or rhonchi HEART:  Regular rate rhythm, no murmurs, no rubs, no clicks Abd:  soft, positive bowel sounds, no organomegally, no rebound, no guarding Ext:  2 plus pulses, trace peripheral edema, no cyanosis, no clubbing Skin:  No rashes no nodules Neuro:  CN II through XII intact, motor grossly intact   DEVICE  Normal device function.  See PaceArt for details.   Assess/Plan: 1. PPM - his [employer] DDD PM is working normally. Will recheck in several months. 2. HTN - his blood pressure is normal today. He is  encouraged to maintain a low sodium diet. 3. Dyslipidemia - he will continue his statin therapy.  Mikle Bosworth.D.

## 2017-01-03 DIAGNOSIS — E1165 Type 2 diabetes mellitus with hyperglycemia: Secondary | ICD-10-CM | POA: Diagnosis not present

## 2017-01-03 DIAGNOSIS — I10 Essential (primary) hypertension: Secondary | ICD-10-CM | POA: Diagnosis not present

## 2017-01-03 DIAGNOSIS — E784 Other hyperlipidemia: Secondary | ICD-10-CM | POA: Diagnosis not present

## 2017-01-07 DIAGNOSIS — E1142 Type 2 diabetes mellitus with diabetic polyneuropathy: Secondary | ICD-10-CM | POA: Diagnosis not present

## 2017-01-07 DIAGNOSIS — B351 Tinea unguium: Secondary | ICD-10-CM | POA: Diagnosis not present

## 2017-03-22 DIAGNOSIS — E1142 Type 2 diabetes mellitus with diabetic polyneuropathy: Secondary | ICD-10-CM | POA: Diagnosis not present

## 2017-03-22 DIAGNOSIS — B351 Tinea unguium: Secondary | ICD-10-CM | POA: Diagnosis not present

## 2017-03-31 ENCOUNTER — Emergency Department (HOSPITAL_COMMUNITY): Payer: Medicare Other

## 2017-03-31 ENCOUNTER — Observation Stay (HOSPITAL_COMMUNITY)
Admission: EM | Admit: 2017-03-31 | Discharge: 2017-04-01 | Disposition: A | Discharge status: Home / Self Care | Payer: Medicare Other | Attending: Internal Medicine | Admitting: Internal Medicine

## 2017-03-31 ENCOUNTER — Encounter (HOSPITAL_COMMUNITY): Payer: Self-pay | Admitting: *Deleted

## 2017-03-31 DIAGNOSIS — E1122 Type 2 diabetes mellitus with diabetic chronic kidney disease: Secondary | ICD-10-CM | POA: Insufficient documentation

## 2017-03-31 DIAGNOSIS — I200 Unstable angina: Secondary | ICD-10-CM | POA: Diagnosis not present

## 2017-03-31 DIAGNOSIS — D72819 Decreased white blood cell count, unspecified: Secondary | ICD-10-CM | POA: Diagnosis not present

## 2017-03-31 DIAGNOSIS — N183 Chronic kidney disease, stage 3 unspecified: Secondary | ICD-10-CM

## 2017-03-31 DIAGNOSIS — Z950 Presence of cardiac pacemaker: Secondary | ICD-10-CM | POA: Diagnosis not present

## 2017-03-31 DIAGNOSIS — Z79899 Other long term (current) drug therapy: Secondary | ICD-10-CM | POA: Diagnosis not present

## 2017-03-31 DIAGNOSIS — Z7982 Long term (current) use of aspirin: Secondary | ICD-10-CM | POA: Insufficient documentation

## 2017-03-31 DIAGNOSIS — I10 Essential (primary) hypertension: Secondary | ICD-10-CM

## 2017-03-31 DIAGNOSIS — R079 Chest pain, unspecified: Secondary | ICD-10-CM | POA: Diagnosis present

## 2017-03-31 DIAGNOSIS — I129 Hypertensive chronic kidney disease with stage 1 through stage 4 chronic kidney disease, or unspecified chronic kidney disease: Secondary | ICD-10-CM | POA: Diagnosis not present

## 2017-03-31 DIAGNOSIS — E1121 Type 2 diabetes mellitus with diabetic nephropathy: Secondary | ICD-10-CM | POA: Diagnosis not present

## 2017-03-31 DIAGNOSIS — Z7984 Long term (current) use of oral hypoglycemic drugs: Secondary | ICD-10-CM | POA: Diagnosis not present

## 2017-03-31 DIAGNOSIS — Z23 Encounter for immunization: Secondary | ICD-10-CM | POA: Diagnosis not present

## 2017-03-31 DIAGNOSIS — E1129 Type 2 diabetes mellitus with other diabetic kidney complication: Secondary | ICD-10-CM | POA: Diagnosis present

## 2017-03-31 LAB — BASIC METABOLIC PANEL
Anion gap: 9 (ref 5–15)
BUN: 28 mg/dL — AB (ref 6–20)
CALCIUM: 9.3 mg/dL (ref 8.9–10.3)
CO2: 25 mmol/L (ref 22–32)
Chloride: 107 mmol/L (ref 101–111)
Creatinine, Ser: 1.49 mg/dL — ABNORMAL HIGH (ref 0.61–1.24)
GFR calc Af Amer: 48 mL/min — ABNORMAL LOW (ref >60)
GFR, EST NON AFRICAN AMERICAN: 41 mL/min — AB (ref >60)
GLUCOSE: 168 mg/dL — AB (ref 65–99)
POTASSIUM: 4.1 mmol/L (ref 3.5–5.1)
Sodium: 141 mmol/L (ref 135–145)

## 2017-03-31 LAB — I-STAT TROPONIN, ED: Troponin i, poc: 0.03 ng/mL (ref 0.00–0.08)

## 2017-03-31 LAB — TROPONIN I

## 2017-03-31 LAB — CBC
HEMATOCRIT: 34.5 % — AB (ref 39.0–52.0)
Hemoglobin: 11.3 g/dL — ABNORMAL LOW (ref 13.0–17.0)
MCH: 26 pg (ref 26.0–34.0)
MCHC: 32.8 g/dL (ref 30.0–36.0)
MCV: 79.3 fL (ref 78.0–100.0)
PLATELETS: 189 10*3/uL (ref 150–400)
RBC: 4.35 MIL/uL (ref 4.22–5.81)
RDW: 14.9 % (ref 11.5–15.5)
WBC: 3.9 10*3/uL — ABNORMAL LOW (ref 4.0–10.5)

## 2017-03-31 LAB — GLUCOSE, CAPILLARY
GLUCOSE-CAPILLARY: 200 mg/dL — AB (ref 65–99)
Glucose-Capillary: 182 mg/dL — ABNORMAL HIGH (ref 65–99)

## 2017-03-31 MED ORDER — ALPRAZOLAM 0.25 MG PO TABS: 0.2500 mg | ORAL_TABLET | Freq: Two times a day (BID) | ORAL | Status: DC | PRN

## 2017-03-31 MED ORDER — ASPIRIN 81 MG PO TABS
81.0000 mg | ORAL_TABLET | Freq: Every day | ORAL | Status: DC
Start: 2017-04-01 — End: 2017-03-31

## 2017-03-31 MED ORDER — ONDANSETRON HCL 4 MG/2ML IJ SOLN: 4.0000 mg | Freq: Four times a day (QID) | INTRAMUSCULAR | Status: DC | PRN

## 2017-03-31 MED ORDER — ASPIRIN 81 MG PO CHEW
324.0000 mg | CHEWABLE_TABLET | Freq: Once | ORAL | Status: AC
  Administered 2017-03-31: 324 mg via ORAL
  Filled 2017-03-31: qty 4

## 2017-03-31 MED ORDER — SODIUM CHLORIDE 0.9 % IV SOLN: 250.0000 mL | INTRAVENOUS | Status: DC | PRN

## 2017-03-31 MED ORDER — DOCUSATE SODIUM 100 MG PO CAPS: 100.0000 mg | ORAL_CAPSULE | Freq: Every day | ORAL | Status: DC | PRN

## 2017-03-31 MED ORDER — INSULIN ASPART 100 UNIT/ML ~~LOC~~ SOLN
0.0000 [IU] | Freq: Three times a day (TID) | SUBCUTANEOUS | Status: DC
  Administered 2017-03-31 – 2017-04-01 (×2): 2 [IU] via SUBCUTANEOUS
  Administered 2017-04-01: 1 [IU] via SUBCUTANEOUS

## 2017-03-31 MED ORDER — INFLUENZA VAC SPLIT HIGH-DOSE 0.5 ML IM SUSY
0.5000 mL | PREFILLED_SYRINGE | INTRAMUSCULAR | Status: AC
  Administered 2017-04-01: 0.5 mL via INTRAMUSCULAR
  Filled 2017-03-31: qty 0.5

## 2017-03-31 MED ORDER — SODIUM CHLORIDE 0.9% FLUSH: 3.0000 mL | INTRAVENOUS | Status: DC | PRN

## 2017-03-31 MED ORDER — FENOFIBRATE 160 MG PO TABS
160.0000 mg | ORAL_TABLET | Freq: Every evening | ORAL | Status: DC
  Administered 2017-03-31: 160 mg via ORAL
  Filled 2017-03-31 (×2): qty 1

## 2017-03-31 MED ORDER — NITROGLYCERIN 2 % TD OINT
0.5000 [in_us] | TOPICAL_OINTMENT | Freq: Once | TRANSDERMAL | Status: AC
  Administered 2017-03-31: 0.5 [in_us] via TOPICAL
  Filled 2017-03-31: qty 1

## 2017-03-31 MED ORDER — NITROGLYCERIN 0.4 MG SL SUBL: 0.4000 mg | SUBLINGUAL_TABLET | SUBLINGUAL | Status: DC | PRN

## 2017-03-31 MED ORDER — MORPHINE SULFATE (PF) 2 MG/ML IV SOLN: 1.0000 mg | INTRAVENOUS | Status: DC | PRN

## 2017-03-31 MED ORDER — LOSARTAN POTASSIUM 50 MG PO TABS
100.0000 mg | ORAL_TABLET | Freq: Every day | ORAL | Status: DC
  Administered 2017-04-01: 100 mg via ORAL
  Filled 2017-03-31 (×2): qty 2

## 2017-03-31 MED ORDER — ASPIRIN EC 81 MG PO TBEC
81.0000 mg | DELAYED_RELEASE_TABLET | Freq: Every day | ORAL | Status: DC
  Administered 2017-04-01: 81 mg via ORAL
  Filled 2017-03-31: qty 1

## 2017-03-31 MED ORDER — GI COCKTAIL ~~LOC~~: 30.0000 mL | Freq: Three times a day (TID) | ORAL | Status: DC | PRN

## 2017-03-31 MED ORDER — INSULIN ASPART 100 UNIT/ML ~~LOC~~ SOLN: 0.0000 [IU] | Freq: Every day | SUBCUTANEOUS | Status: DC

## 2017-03-31 MED ORDER — SODIUM CHLORIDE 0.9% FLUSH
3.0000 mL | Freq: Two times a day (BID) | INTRAVENOUS | Status: DC
  Administered 2017-03-31 – 2017-04-01 (×2): 3 mL via INTRAVENOUS

## 2017-03-31 MED ORDER — SIMVASTATIN 20 MG PO TABS
20.0000 mg | ORAL_TABLET | Freq: Every day | ORAL | Status: DC
  Administered 2017-03-31: 20 mg via ORAL
  Filled 2017-03-31: qty 1

## 2017-03-31 MED ORDER — ACETAMINOPHEN 325 MG PO TABS: 650.0000 mg | ORAL_TABLET | ORAL | Status: DC | PRN

## 2017-03-31 MED ORDER — HEPARIN SODIUM (PORCINE) 5000 UNIT/ML IJ SOLN
5000.0000 [IU] | Freq: Three times a day (TID) | INTRAMUSCULAR | Status: DC
  Administered 2017-03-31 – 2017-04-01 (×2): 5000 [IU] via SUBCUTANEOUS
  Filled 2017-03-31 (×2): qty 1

## 2017-03-31 NOTE — ED Provider Notes (Signed)
[facility] DEPT Provider Note   CSN: [id number] Arrival date & time: 03/31/17  1421     History   Chief Complaint Chief Complaint  Patient presents with  . Chest Pain    chest tightness    HPI Kyle Schultz is a 83 y.o. male.  Pt presents to the ED today with chest pain which he describes as tightness.  The pt said it occurred when he was walking.  He felt sob at the time as well.  Pt said cp is gone now after resting.  The pt did take an asa last night.  No nitro this morning.      Past Medical History:  Diagnosis Date  . CKD (chronic kidney disease), stage I   . Diabetes mellitus   . First degree AV block   . Hyperlipidemia   . Hypertension   . Second degree AV block   . Sinus bradycardia     Patient Active Problem List   Diagnosis Date Noted  . Leukopenia 03/31/2017  . Chest pain 03/31/2017  . SIRS (systemic inflammatory response syndrome) ([place]) 05/31/2016  . Fever 05/31/2016  . Chronic renal insufficiency, stage III (moderate) 06/26/2015  . Palpitations 06/26/2015  . SINOATRIAL NODE DYSFUNCTION 08/01/2009  . Cardiac pacemaker in situ 08/01/2009  . Type 2 diabetes mellitus with renal manifestations, controlled ([place]) 03/10/2009  . Dyslipidemia 03/10/2009  . Essential hypertension 03/10/2009    Past Surgical History:  Procedure Laterality Date  . COLONOSCOPY    . colonscopy    . HEMORRHOID SURGERY    . PACEMAKER INSERTION  2010   [facility]  . UMBILICAL HERNIA REPAIR  04/2008       Home Medications    Prior to Admission medications   Medication Sig Start Date End Date Taking? Authorizing Provider  aspirin 81 MG tablet Take 81 mg by mouth every evening.    Yes [provider]  docusate sodium (COLACE) 100 MG capsule Take 100 mg by mouth daily as needed for mild constipation.   Yes [provider]  fenofibrate 160 MG tablet Take 160 mg by mouth every evening.    Yes [provider]  glipiZIDE (GLUCOTROL) 10 MG tablet  Take 10 mg by mouth 2 (two) times daily.  02/13/11  Yes [provider]  linagliptin (TRADJENTA) 5 MG TABS tablet Take 5 mg by mouth daily.   Yes [provider]  losartan (COZAAR) 100 MG tablet Take 100 mg by mouth daily.  02/13/11  Yes [provider]  simvastatin (ZOCOR) 20 MG tablet Take 20 mg by mouth every evening.   Yes [provider]    Family History Family History  Problem Relation Age of Onset  . Heart disease Father   . Cancer Sister   . Cancer Sister   . Cancer Sister   . ALS Sister     Social History Social History  Substance Use Topics  . Smoking status: Never Smoker  . Smokeless tobacco: Never Used  . Alcohol use No     Allergies   Other   Review of Systems Review of Systems  Cardiovascular: Positive for chest pain.  All other systems reviewed and are negative.    Physical Exam Updated Vital Signs BP (!) 148/80 (BP Location: Left Arm)   Pulse 81   Temp 98.3 F (36.8 C) (Oral)   Resp 18   Ht 6' (1.829 m)   Wt 96.2 kg (212 lb)   SpO2 99%   BMI  28.75 kg/m   Physical Exam  Constitutional: He is oriented to person, place, and time. He appears well-developed and well-nourished.  HENT:  Head: Normocephalic and atraumatic.  Right Ear: External ear normal.  Left Ear: External ear normal.  Nose: Nose normal.  Mouth/Throat: Oropharynx is clear and moist.  Eyes: Pupils are equal, round, and reactive to light. Conjunctivae and EOM are normal.  Neck: Normal range of motion. Neck supple.  Cardiovascular: Normal rate, regular rhythm, normal heart sounds and intact distal pulses.   Pulmonary/Chest: Effort normal and breath sounds normal.  Abdominal: Soft. Bowel sounds are normal.  Musculoskeletal: Normal range of motion.  Neurological: He is alert and oriented to person, place, and time.  Skin: Skin is warm. Capillary refill takes less than 2 seconds.  Psychiatric: He has a normal mood and affect. His behavior is normal.  Judgment and thought content normal.  Nursing note and vitals reviewed.    ED Treatments / Results  Labs (all labs ordered are listed, but only abnormal results are displayed) Labs Reviewed  BASIC METABOLIC PANEL - Abnormal; Notable for the following:       Result Value   Glucose, Bld 168 (*)    BUN 28 (*)    Creatinine, Ser 1.49 (*)    GFR calc non Af Amer 41 (*)    GFR calc Af Amer 48 (*)    All other components within normal limits  CBC - Abnormal; Notable for the following:    WBC 3.9 (*)    Hemoglobin 11.3 (*)    HCT 34.5 (*)    All other components within normal limits  I-STAT TROPONIN, ED  CBG MONITORING, ED    EKG  EKG Interpretation  Date/Time:  Thursday March 31 2017 14:37:29 EDT Ventricular Rate:  83 PR Interval:    QRS Duration: 161 QT Interval:  409 QTC Calculation: 481 R Axis:   -54 Text Interpretation:  Atrial-sensed ventricular-paced rhythm No further analysis attempted due to paced rhythm No significant change since last tracing Confirmed by Isla Pence [phone]) on 03/31/2017 2:54:15 PM       Radiology Dg Chest 2 View  Result Date: 03/31/2017 CLINICAL DATA:  83 year old male with acute chest pain, chest tightness. EXAM: CHEST  2 VIEW COMPARISON:  10/19/2016 and earlier. FINDINGS: Stable left chest dual lead cardiac pacemaker. Mildly lower lung volumes. Cardiac size is stable at the upper limits of normal. Other mediastinal contours are within normal limits. Visualized tracheal air column is within normal limits. Chronic right lower lobe calcified granuloma. No pneumothorax, pulmonary edema, pleural effusion or acute pulmonary opacity. No acute osseous abnormality identified. Negative visible bowel gas pattern. IMPRESSION: No acute cardiopulmonary abnormality. Electronically Signed   By: Genevie Ann M.D.   On: 03/31/2017 15:15    Procedures Procedures (including critical care time)  Medications Ordered in ED Medications  nitroGLYCERIN (NITROGLYN)  2 % ointment 0.5 inch (not administered)  aspirin chewable tablet 324 mg (324 mg Oral Given 03/31/17 1453)     Initial Impression / Assessment and Plan / ED Course  I have reviewed the triage vital signs and the nursing notes.  Pertinent labs & imaging results that were available during my care of the patient were reviewed by me and considered in my medical decision making (see chart for details).    Pt with exertional cp.  Nl EKG and trop.  Pt with heart score of 4.  Pt d/w Dr. Myna Hidalgo for observation r/o MI.  Final Clinical Impressions(s) /  ED Diagnoses   Final diagnoses:  Unstable angina ([place])  CRI (chronic renal insufficiency), stage 3 (moderate)    New Prescriptions New Prescriptions   No medications on file     Isla Pence, MD 03/31/17 1616

## 2017-03-31 NOTE — ED Triage Notes (Signed)
Pt reports new onset chest tightness that "feels like gas". Pt reports lower extremity tightness with calf pain.

## 2017-03-31 NOTE — H&P (Signed)
History and Physical    JUMA OXLEY YIR:[id number] DOB: May 14, 1930 DOA: 03/31/2017  PCP: Rosita Fire, MD   Patient coming from: Home  Chief Complaint: Chest tightness   HPI: OSHEA PERCIVAL is a 83 y.o. male with medical history significant for  type 2 diabetes mellitus, hypertension, chronic kidney disease stage III, and symptomatic bradycardia status post pacer placement, now presenting to the emergency department for evaluation of chest tightness. Patient reports that he been in his usual state of health until earlier today when he experienced some discomfort in his central chest described as a "tightness." He describes it as "like I had gas." He was eating at the time on onset and reports associated belching. Later, while up and active, he experienced the same symptoms, along with SOB and fatigue. These symptoms resolved over the course of several minutes with rest. He has not had recurrence in the chest discomfort, but continues to have some belching and reports a lot of flatus.   ED Course: Upon arrival to the ED, patient is found to be afebrile, saturating well on room air, and with vitals otherwise stable. EKG features a paced rhythm and chest x-ray is negative for acute cardiopulmonary disease. Chemistry panel reveals a creatinine 1.49 which appears consistent with his baseline. CBC is notable for a leukopenia with WBC 3900, better than last April. There is a stable normocytic anemia with hemoglobin of 11.3. Troponin is within the normal limits. Patient was treated with 324 mg of aspirin in the ED. He remained hemodynamically stable, and no apparent respiratory distress, and with no recurrence in his presenting complaints. He will be observed on telemetry unit for ongoing evaluation and management of chest tightness concerning for possible ACS.   Review of Systems:  All other systems reviewed and apart from HPI, are negative.  Past Medical History:  Diagnosis Date  . CKD (chronic kidney  disease), stage I   . Diabetes mellitus   . First degree AV block   . Hyperlipidemia   . Hypertension   . Second degree AV block   . Sinus bradycardia     Past Surgical History:  Procedure Laterality Date  . COLONOSCOPY    . colonscopy    . HEMORRHOID SURGERY    . PACEMAKER INSERTION  2010   [facility]  . UMBILICAL HERNIA REPAIR  04/2008     reports that he has never smoked. He has never used smokeless tobacco. He reports that he does not drink alcohol or use drugs.  Allergies  Allergen Reactions  . Other     "some kind of fluid pill I can't take"     Family History  Problem Relation Age of Onset  . Heart disease Father   . Cancer Sister   . Cancer Sister   . Cancer Sister   . ALS Sister      Prior to Admission medications   Medication Sig Start Date End Date Taking? Authorizing Provider  aspirin 81 MG tablet Take 81 mg by mouth every evening.    Yes [provider]  docusate sodium (COLACE) 100 MG capsule Take 100 mg by mouth daily as needed for mild constipation.   Yes [provider]  fenofibrate 160 MG tablet Take 160 mg by mouth every evening.    Yes [provider]  glipiZIDE (GLUCOTROL) 10 MG tablet Take 10 mg by mouth 2 (two) times daily.  02/13/11  Yes [provider]  linagliptin (TRADJENTA) 5 MG TABS tablet Take  5 mg by mouth daily.   Yes [provider]  losartan (COZAAR) 100 MG tablet Take 100 mg by mouth daily.  02/13/11  Yes [provider]  simvastatin (ZOCOR) 20 MG tablet Take 20 mg by mouth every evening.   Yes [provider]    Physical Exam: Vitals:   03/31/17 1430 03/31/17 1438  BP: (!) 148/80   Pulse: 81   Resp: 18   Temp: 98.3 F (36.8 C)   TempSrc: Oral   SpO2: 99%   Weight:  96.2 kg (212 lb)  Height:  6' (1.829 m)      Constitutional: NAD, calm, comfortable Eyes: PERTLA, lids and conjunctivae normal ENMT: Mucous membranes are moist. Posterior pharynx clear of any exudate  or lesions.   Neck: normal, supple, no masses, no thyromegaly Respiratory: clear to auscultation bilaterally, no wheezing, no crackles. Normal respiratory effort.   Cardiovascular: S1 & S2 heard, regular rate and rhythm. No extremity edema. No significant JVD. Abdomen: No distension, no tenderness, no masses palpated. Bowel sounds normal.  Musculoskeletal: no clubbing / cyanosis. No joint deformity upper and lower extremities.   Skin: no significant rashes, lesions, ulcers. Warm, dry, well-perfused. Neurologic: CN 2-12 grossly intact. Sensation intact. Strength 5/5 in all 4 limbs.  Psychiatric: Alert and oriented x 3. Pleasant, cooperative.     Labs on Admission: I have personally reviewed following labs and imaging studies  CBC:  Recent Labs Lab 03/31/17 1435  WBC 3.9*  HGB 11.3*  HCT 34.5*  MCV 79.3  PLT 100   Basic Metabolic Panel:  Recent Labs Lab 03/31/17 1435  NA 141  K 4.1  CL 107  CO2 25  GLUCOSE 168*  BUN 28*  CREATININE 1.49*  CALCIUM 9.3   GFR: Estimated Creatinine Clearance: 43.6 mL/min (A) (by C-G formula based on SCr of 1.49 mg/dL (H)). Liver Function Tests: No results for input(s): AST, ALT, ALKPHOS, BILITOT, PROT, ALBUMIN in the last 168 hours. No results for input(s): LIPASE, AMYLASE in the last 168 hours. No results for input(s): AMMONIA in the last 168 hours. Coagulation Profile: No results for input(s): INR, PROTIME in the last 168 hours. Cardiac Enzymes: No results for input(s): CKTOTAL, CKMB, CKMBINDEX, TROPONINI in the last 168 hours. BNP (last 3 results) No results for input(s): PROBNP in the last 8760 hours. HbA1C: No results for input(s): HGBA1C in the last 72 hours. CBG: No results for input(s): GLUCAP in the last 168 hours. Lipid Profile: No results for input(s): CHOL, HDL, LDLCALC, TRIG, CHOLHDL, LDLDIRECT in the last 72 hours. Thyroid Function Tests: No results for input(s): TSH, T4TOTAL, FREET4, T3FREE, THYROIDAB in the last 72  hours. Anemia Panel: No results for input(s): VITAMINB12, FOLATE, FERRITIN, TIBC, IRON, RETICCTPCT in the last 72 hours. Urine analysis:    Component Value Date/Time   COLORURINE YELLOW 10/19/2016 2118   APPEARANCEUR CLEAR 10/19/2016 2118   LABSPEC 1.015 10/19/2016 2118   PHURINE 5.0 10/19/2016 2118   GLUCOSEU 150 (A) 10/19/2016 2118   HGBUR NEGATIVE 10/19/2016 2118   BILIRUBINUR NEGATIVE 10/19/2016 2118   KETONESUR NEGATIVE 10/19/2016 2118   PROTEINUR NEGATIVE 10/19/2016 2118   UROBILINOGEN 0.2 01/12/2015 0845   NITRITE NEGATIVE 10/19/2016 2118   LEUKOCYTESUR NEGATIVE 10/19/2016 2118   Sepsis Labs: @LABRCNTIP (procalcitonin:4,lacticidven:4) )No results found for this or any previous visit (from the past 240 hour(s)).   Radiological Exams on Admission: Dg Chest 2 View  Result Date: 03/31/2017 CLINICAL DATA:  83 year old male with acute chest pain, chest tightness. EXAM:  CHEST  2 VIEW COMPARISON:  10/19/2016 and earlier. FINDINGS: Stable left chest dual lead cardiac pacemaker. Mildly lower lung volumes. Cardiac size is stable at the upper limits of normal. Other mediastinal contours are within normal limits. Visualized tracheal air column is within normal limits. Chronic right lower lobe calcified granuloma. No pneumothorax, pulmonary edema, pleural effusion or acute pulmonary opacity. No acute osseous abnormality identified. Negative visible bowel gas pattern. IMPRESSION: No acute cardiopulmonary abnormality. Electronically Signed   By: Genevie Ann M.D.   On: 03/31/2017 15:15    EKG: Independently reviewed. Atrial-sensed, ventricular-paced. Assessment/Plan    1. Chest discomfort  - Symptoms are mainly atypical and more suggestive on a GI etiology, but there has also been some exertional component with SOB that is concerning for angina  - ED workup included negative POC troponin, unchanged CXR, and EKG with paced-rhythm and no appreciable acute ST-T abnormalities  - He was treated in ED  with ASA 324 mg and NTG ointment  - Plan to continue cardiac monitoring, obtain serial troponin measurements, repeat EKG, continue daily ASA, ARB, statin; no beta-blocker d/t hx of symptomatic bradycardia; trial prn GI cocktail for possible GI etiology    2. Hypertension  - BP is at goal  - Continue losartan as tolerated   3. Type II DM  - A1c was 8.0% in November '17  - Managed at home with Tradjenta and glipizide, held on admission  - Check CBG's with meals and qHS  - Start a low-intensity SSI with Novolog   4. Leukopenia  - WBC is 3,900 on admission, higher than the previous CBC from April '18  - Uncertain etiology, no evidence for infection, will culture if febrile   5. CKD stage III  - SCr is 1.49 on admission, consistent with his apparent baseline  - Avoid hypotension or dehydration, renally-dose medications as needed    DVT prophylaxis: sq heparin  Code Status: Full  Family Communication: Discussed with patient Disposition Plan: Observe on telemetry  Consults called: None Admission status: Observation    Vianne Bulls, MD [facility] Pager [phone]  If 7PM-7AM, please contact night-coverage [email or web address] Password [email or web address]  03/31/2017, 4:17 PM

## 2017-04-01 DIAGNOSIS — R079 Chest pain, unspecified: Secondary | ICD-10-CM | POA: Diagnosis not present

## 2017-04-01 DIAGNOSIS — I200 Unstable angina: Secondary | ICD-10-CM | POA: Diagnosis not present

## 2017-04-01 LAB — CBC
HCT: 36.5 % — ABNORMAL LOW (ref 39.0–52.0)
Hemoglobin: 11.8 g/dL — ABNORMAL LOW (ref 13.0–17.0)
MCH: 25.7 pg — ABNORMAL LOW (ref 26.0–34.0)
MCHC: 32.3 g/dL (ref 30.0–36.0)
MCV: 79.3 fL (ref 78.0–100.0)
Platelets: 183 10*3/uL (ref 150–400)
RBC: 4.6 MIL/uL (ref 4.22–5.81)
RDW: 14.9 % (ref 11.5–15.5)
WBC: 3.9 10*3/uL — ABNORMAL LOW (ref 4.0–10.5)

## 2017-04-01 LAB — GLUCOSE, CAPILLARY
GLUCOSE-CAPILLARY: 130 mg/dL — AB (ref 65–99)
Glucose-Capillary: 162 mg/dL — ABNORMAL HIGH (ref 65–99)

## 2017-04-01 LAB — BASIC METABOLIC PANEL
Anion gap: 6 (ref 5–15)
BUN: 27 mg/dL — ABNORMAL HIGH (ref 6–20)
CALCIUM: 9.2 mg/dL (ref 8.9–10.3)
CHLORIDE: 106 mmol/L (ref 101–111)
CO2: 27 mmol/L (ref 22–32)
CREATININE: 1.34 mg/dL — AB (ref 0.61–1.24)
GFR calc non Af Amer: 47 mL/min — ABNORMAL LOW (ref >60)
GFR, EST AFRICAN AMERICAN: 54 mL/min — AB (ref >60)
Glucose, Bld: 140 mg/dL — ABNORMAL HIGH (ref 65–99)
Potassium: 4.6 mmol/L (ref 3.5–5.1)
SODIUM: 139 mmol/L (ref 135–145)

## 2017-04-01 LAB — TROPONIN I

## 2017-04-01 NOTE — Care Management Obs Status (Signed)
[facility] NOTIFICATION   Patient Details  Name: ZAYDON KINSER MRN: [id number] Date of Birth: [birth year]   Medicare Observation Status Notification Given:  Yes    Sherald Barge, RN 04/01/2017, 9:11 AM

## 2017-04-01 NOTE — Care Management Note (Signed)
Case Management Note  Patient Details  Name: Kyle Schultz MRN: [id number] Date of Birth: 1930/05/26  Subjective/Objective:                  Observation for r/o CP. Pt seen to administer MOON. Pt is from home, lives with grandson who works out of town often weeks at a time. Pt ind with ADL's. He drives, does his own shopping. He uses no DME pta. He has PCP, insurance with drug coverage and no difficulty affording medications. Pt communicates no needs or concerns about his DC plan.   Action/Plan: DC home with self care after work-up complete.   Expected Discharge Date:       04/01/2017           Expected Discharge Plan:  Home/Self Care  In-House Referral:  NA  Discharge planning Services  CM Consult  Post Acute Care Choice:  NA Choice offered to:  NA  Status of Service:  Completed, signed off  Sherald Barge, RN 04/01/2017, 9:12 AM

## 2017-04-01 NOTE — Discharge Summary (Signed)
Physician Discharge Summary  Kyle Schultz:[id number] DOB: August 09, 1929 DOA: 03/31/2017  PCP: Rosita Fire, MD  Admit date: 03/31/2017 Discharge date: 04/01/2017  Time spent: 45 minutes  Recommendations for Outpatient Follow-up:  -To be discharged home today. -Advised to follow-up with PCP in 2 weeks.   Discharge Diagnoses:  Principal Problem:   Chest pain Active Problems:   Type 2 diabetes mellitus with renal manifestations, controlled (HCC)   Essential hypertension   Cardiac pacemaker in situ   Chronic renal insufficiency, stage III (moderate)   Leukopenia   Discharge Condition: Stable and improved  Filed Weights   03/31/17 1438 03/31/17 1706  Weight: 96.2 kg (212 lb) 96.2 kg (212 lb)    History of present illness:  As per Dr. Myna Hidalgo on 9/20: Kyle Schultz is a 83 y.o. male with medical history significant for  type 2 diabetes mellitus, hypertension, chronic kidney disease stage III, and symptomatic bradycardia status post pacer placement, now presenting to the emergency department for evaluation of chest tightness. Patient reports that he been in his usual state of health until earlier today when he experienced some discomfort in his central chest described as a "tightness." He describes it as "like I had gas." He was eating at the time on onset and reports associated belching. Later, while up and active, he experienced the same symptoms, along with SOB and fatigue. These symptoms resolved over the course of several minutes with rest. He has not had recurrence in the chest discomfort, but continues to have some belching and reports a lot of flatus.   ED Course: Upon arrival to the ED, patient is found to be afebrile, saturating well on room air, and with vitals otherwise stable. EKG features a paced rhythm and chest x-ray is negative for acute cardiopulmonary disease. Chemistry panel reveals a creatinine 1.49 which appears consistent with his baseline. CBC is notable for a  leukopenia with WBC 3900, better than last April. There is a stable normocytic anemia with hemoglobin of 11.3. Troponin is within the normal limits. Patient was treated with 324 mg of aspirin in the ED. He remained hemodynamically stable, and no apparent respiratory distress, and with no recurrence in his presenting complaints. He will be observed on telemetry unit for ongoing evaluation and management of chest tightness concerning for possible ACS.   Hospital Course:   Chest pain -No further since admission. -Has ruled out for ACS with negative troponins and EKG without acute ischemic changes. -Feel there is no need for cardiology intervention at this time.  Rest of chronic conditions have been stable.  Procedures:  None   Consultations:  None  Discharge Instructions  Discharge Instructions    Diet - low sodium heart healthy    Complete by:  As directed    Increase activity slowly    Complete by:  As directed      Allergies as of 04/01/2017      Reactions   Other    "some kind of fluid pill I can't take"       Medication List    TAKE these medications   aspirin 81 MG tablet Take 81 mg by mouth every evening.   docusate sodium 100 MG capsule Commonly known as:  COLACE Take 100 mg by mouth daily as needed for mild constipation.   fenofibrate 160 MG tablet Take 160 mg by mouth every evening.   glipiZIDE 10 MG tablet Commonly known as:  GLUCOTROL Take 10 mg by mouth 2 (two)  times daily.   losartan 100 MG tablet Commonly known as:  COZAAR Take 100 mg by mouth daily.   simvastatin 20 MG tablet Commonly known as:  ZOCOR Take 20 mg by mouth every evening.   TRADJENTA 5 MG Tabs tablet Generic drug:  linagliptin Take 5 mg by mouth daily.            Discharge Care Instructions        Start     Ordered   04/01/17 0000  Increase activity slowly     04/01/17 1345   04/01/17 0000  Diet - low sodium heart healthy     04/01/17 1345     Allergies    Allergen Reactions  . Other     "some kind of fluid pill I can't take"    Follow-up Information    Rosita Fire, MD. Schedule an appointment as soon as possible for a visit on 04/15/2017.   Specialty:  Internal Medicine Why:  10:00am Contact information: [place] [address] [phone]            The results of significant diagnostics from this hospitalization (including imaging, microbiology, ancillary and laboratory) are listed below for reference.    Significant Diagnostic Studies: Dg Chest 2 View  Result Date: 03/31/2017 CLINICAL DATA:  83 year old male with acute chest pain, chest tightness. EXAM: CHEST  2 VIEW COMPARISON:  10/19/2016 and earlier. FINDINGS: Stable left chest dual lead cardiac pacemaker. Mildly lower lung volumes. Cardiac size is stable at the upper limits of normal. Other mediastinal contours are within normal limits. Visualized tracheal air column is within normal limits. Chronic right lower lobe calcified granuloma. No pneumothorax, pulmonary edema, pleural effusion or acute pulmonary opacity. No acute osseous abnormality identified. Negative visible bowel gas pattern. IMPRESSION: No acute cardiopulmonary abnormality. Electronically Signed   By: Genevie Ann M.D.   On: 03/31/2017 15:15    Microbiology: No results found for this or any previous visit (from the past 240 hour(s)).   Labs: Basic Metabolic Panel:  Recent Labs Lab 03/31/17 1435 04/01/17 0441  NA 141 139  K 4.1 4.6  CL 107 106  CO2 25 27  GLUCOSE 168* 140*  BUN 28* 27*  CREATININE 1.49* 1.34*  CALCIUM 9.3 9.2   Liver Function Tests: No results for input(s): AST, ALT, ALKPHOS, BILITOT, PROT, ALBUMIN in the last 168 hours. No results for input(s): LIPASE, AMYLASE in the last 168 hours. No results for input(s): AMMONIA in the last 168 hours. CBC:  Recent Labs Lab 03/31/17 1435 04/01/17 0441  WBC 3.9* 3.9*  HGB 11.3* 11.8*  HCT 34.5* 36.5*  MCV 79.3 79.3   PLT 189 183   Cardiac Enzymes:  Recent Labs Lab 03/31/17 1616 03/31/17 2126 04/01/17 0441  TROPONINI <0.03 <0.03 <0.03   BNP: BNP (last 3 results) No results for input(s): BNP in the last 8760 hours.  ProBNP (last 3 results) No results for input(s): PROBNP in the last 8760 hours.  CBG:  Recent Labs Lab 03/31/17 1757 03/31/17 2059 04/01/17 0743 04/01/17 1127  GLUCAP 200* 182* 130* 162*       Signed:  HERNANDEZ ACOSTA,ESTELA  [facility] Pager: [phone] 04/01/2017, 3:47 PM

## 2017-04-01 NOTE — Progress Notes (Signed)
Discharge instructions given, verbalized understanding, out in stable condition via w/c with staff. 

## 2017-04-05 DIAGNOSIS — I10 Essential (primary) hypertension: Secondary | ICD-10-CM | POA: Diagnosis not present

## 2017-04-05 DIAGNOSIS — E1165 Type 2 diabetes mellitus with hyperglycemia: Secondary | ICD-10-CM | POA: Diagnosis not present

## 2017-04-05 DIAGNOSIS — Z950 Presence of cardiac pacemaker: Secondary | ICD-10-CM | POA: Diagnosis not present

## 2017-04-05 DIAGNOSIS — E7800 Pure hypercholesterolemia, unspecified: Secondary | ICD-10-CM | POA: Diagnosis not present

## 2017-05-31 DIAGNOSIS — B351 Tinea unguium: Secondary | ICD-10-CM | POA: Diagnosis not present

## 2017-05-31 DIAGNOSIS — E1142 Type 2 diabetes mellitus with diabetic polyneuropathy: Secondary | ICD-10-CM | POA: Diagnosis not present

## 2017-06-09 ENCOUNTER — Ambulatory Visit (INDEPENDENT_AMBULATORY_CARE_PROVIDER_SITE_OTHER): Payer: Medicare Other | Admitting: *Deleted

## 2017-06-09 DIAGNOSIS — I495 Sick sinus syndrome: Secondary | ICD-10-CM

## 2017-06-09 LAB — CUP PACEART INCLINIC DEVICE CHECK
Battery Remaining Longevity: 49 mo
Brady Statistic RA Percent Paced: 4.8 %
Brady Statistic RV Percent Paced: 98 %
Implantable Lead Implant Date: 20100715
Implantable Lead Location: 753860
Lead Channel Impedance Value: 325 Ohm
Lead Channel Pacing Threshold Amplitude: 0.5 V
Lead Channel Pacing Threshold Pulse Width: 0.4 ms
Lead Channel Sensing Intrinsic Amplitude: 1.6 mV
MDC IDC LEAD IMPLANT DT: 20100715
MDC IDC LEAD LOCATION: 753859
MDC IDC MSMT BATTERY VOLTAGE: 2.83 V
MDC IDC MSMT LEADCHNL RA PACING THRESHOLD AMPLITUDE: 0.5 V
MDC IDC MSMT LEADCHNL RA PACING THRESHOLD PULSEWIDTH: 0.4 ms
MDC IDC MSMT LEADCHNL RV IMPEDANCE VALUE: 450 Ohm
MDC IDC MSMT LEADCHNL RV PACING THRESHOLD AMPLITUDE: 0.5 V
MDC IDC MSMT LEADCHNL RV PACING THRESHOLD PULSEWIDTH: 0.4 ms
MDC IDC MSMT LEADCHNL RV SENSING INTR AMPL: 12 mV
MDC IDC PG IMPLANT DT: 20100715
MDC IDC PG SERIAL: 2285839
MDC IDC SESS DTM: 20181129134635
MDC IDC SET LEADCHNL RA PACING AMPLITUDE: 1.5 V
MDC IDC SET LEADCHNL RV PACING AMPLITUDE: 0.75 V
MDC IDC SET LEADCHNL RV PACING PULSEWIDTH: 0.4 ms
MDC IDC SET LEADCHNL RV SENSING SENSITIVITY: 2 mV

## 2017-06-09 NOTE — Progress Notes (Signed)
Pacemaker check in clinic. Normal device function. Thresholds, sensing, impedances consistent with previous measurements. Device programmed to maximize longevity. 232 mode switches - EGMs show atach (<1%). No high ventricular rates noted. Device programmed at appropriate safety margins. Histogram distribution appropriate for patient activity level. Device programmed to optimize intrinsic conduction. Estimated longevity 20yrs. ROV with GT in 48mo.

## 2017-07-07 DIAGNOSIS — E7849 Other hyperlipidemia: Secondary | ICD-10-CM | POA: Diagnosis not present

## 2017-07-07 DIAGNOSIS — Z0000 Encounter for general adult medical examination without abnormal findings: Secondary | ICD-10-CM | POA: Diagnosis not present

## 2017-07-07 DIAGNOSIS — E119 Type 2 diabetes mellitus without complications: Secondary | ICD-10-CM | POA: Diagnosis not present

## 2017-07-07 DIAGNOSIS — R7309 Other abnormal glucose: Secondary | ICD-10-CM | POA: Diagnosis not present

## 2017-07-07 DIAGNOSIS — I10 Essential (primary) hypertension: Secondary | ICD-10-CM | POA: Diagnosis not present

## 2017-07-07 DIAGNOSIS — E1165 Type 2 diabetes mellitus with hyperglycemia: Secondary | ICD-10-CM | POA: Diagnosis not present

## 2017-07-07 LAB — LIPID PANEL
CHOLESTEROL: 130 (ref 0–200)
HDL: 55 (ref 35–70)
LDL CALC: 57
Triglycerides: 101 (ref 40–160)

## 2017-07-07 LAB — HEMOGLOBIN A1C: HEMOGLOBIN A1C: 10.5

## 2017-07-14 ENCOUNTER — Encounter (HOSPITAL_COMMUNITY): Payer: Self-pay | Admitting: Emergency Medicine

## 2017-07-14 ENCOUNTER — Emergency Department (HOSPITAL_COMMUNITY): Payer: Medicare Other

## 2017-07-14 ENCOUNTER — Other Ambulatory Visit: Payer: Self-pay

## 2017-07-14 ENCOUNTER — Emergency Department (HOSPITAL_COMMUNITY)
Admission: EM | Admit: 2017-07-14 | Discharge: 2017-07-14 | Disposition: A | Discharge status: Home / Self Care | Payer: Medicare Other | Attending: Emergency Medicine | Admitting: Emergency Medicine

## 2017-07-14 DIAGNOSIS — R079 Chest pain, unspecified: Secondary | ICD-10-CM

## 2017-07-14 DIAGNOSIS — N189 Chronic kidney disease, unspecified: Secondary | ICD-10-CM | POA: Insufficient documentation

## 2017-07-14 DIAGNOSIS — E1122 Type 2 diabetes mellitus with diabetic chronic kidney disease: Secondary | ICD-10-CM | POA: Insufficient documentation

## 2017-07-14 DIAGNOSIS — I129 Hypertensive chronic kidney disease with stage 1 through stage 4 chronic kidney disease, or unspecified chronic kidney disease: Secondary | ICD-10-CM | POA: Insufficient documentation

## 2017-07-14 DIAGNOSIS — Z7982 Long term (current) use of aspirin: Secondary | ICD-10-CM | POA: Insufficient documentation

## 2017-07-14 DIAGNOSIS — Z79899 Other long term (current) drug therapy: Secondary | ICD-10-CM | POA: Insufficient documentation

## 2017-07-14 LAB — BASIC METABOLIC PANEL
Anion gap: 8 (ref 5–15)
BUN: 28 mg/dL — AB (ref 6–20)
CALCIUM: 9.7 mg/dL (ref 8.9–10.3)
CHLORIDE: 105 mmol/L (ref 101–111)
CO2: 26 mmol/L (ref 22–32)
CREATININE: 1.56 mg/dL — AB (ref 0.61–1.24)
GFR calc non Af Amer: 39 mL/min — ABNORMAL LOW (ref >60)
GFR, EST AFRICAN AMERICAN: 45 mL/min — AB (ref >60)
Glucose, Bld: 291 mg/dL — ABNORMAL HIGH (ref 65–99)
Potassium: 4.1 mmol/L (ref 3.5–5.1)
SODIUM: 139 mmol/L (ref 135–145)

## 2017-07-14 LAB — CBC WITH DIFFERENTIAL/PLATELET
Basophils Absolute: 0 10*3/uL (ref 0.0–0.1)
Basophils Relative: 1 %
EOS ABS: 0.2 10*3/uL (ref 0.0–0.7)
EOS PCT: 5 %
HCT: 36.8 % — ABNORMAL LOW (ref 39.0–52.0)
Hemoglobin: 11.6 g/dL — ABNORMAL LOW (ref 13.0–17.0)
LYMPHS ABS: 1.2 10*3/uL (ref 0.7–4.0)
Lymphocytes Relative: 37 %
MCH: 25.2 pg — AB (ref 26.0–34.0)
MCHC: 31.5 g/dL (ref 30.0–36.0)
MCV: 80 fL (ref 78.0–100.0)
MONO ABS: 0.3 10*3/uL (ref 0.1–1.0)
MONOS PCT: 8 %
NEUTROS PCT: 49 %
Neutro Abs: 1.6 10*3/uL — ABNORMAL LOW (ref 1.7–7.7)
PLATELETS: 190 10*3/uL (ref 150–400)
RBC: 4.6 MIL/uL (ref 4.22–5.81)
RDW: 14.1 % (ref 11.5–15.5)
WBC: 3.2 10*3/uL — ABNORMAL LOW (ref 4.0–10.5)

## 2017-07-14 LAB — I-STAT TROPONIN, ED
TROPONIN I, POC: 0.02 ng/mL (ref 0.00–0.08)
TROPONIN I, POC: 0.02 ng/mL (ref 0.00–0.08)

## 2017-07-14 NOTE — ED Notes (Signed)
Called [employer] [facility] to have rep, Windle Guard come in and interrogate pt's pacemaker

## 2017-07-14 NOTE — ED Triage Notes (Signed)
Pt complains of chest pressure that started about 3 am this morning. Pt states the pain has gone away.

## 2017-07-14 NOTE — Discharge Instructions (Signed)
You have been seen in the Emergency Department (ED) today for chest pain.  As we have discussed today?s test results are normal, but you may require further testing.  Call Dr. Lovena Le to discuss you ED visit and schedule a follow up appointment.   Please follow up with the recommended doctor as instructed above in these documents regarding today?s emergent visit and your recent symptoms to discuss further management.  Continue to take your regular medications.   Return to the Emergency Department (ED) if you experience any further chest pain/pressure/tightness, difficulty breathing, or sudden sweating, or other symptoms that concern you.   Chest Pain (Nonspecific) It is often hard to give a specific diagnosis for the cause of chest pain. There is always a chance that your pain could be related to something serious, such as a heart attack or a blood clot in the lungs. You need to follow up with your health care provider for further evaluation. CAUSES  Heartburn. Pneumonia or bronchitis. Anxiety or stress. Inflammation around your heart (pericarditis) or lung (pleuritis or pleurisy). A blood clot in the lung. A collapsed lung (pneumothorax). It can develop suddenly on its own (spontaneous pneumothorax) or from trauma to the chest. Shingles infection (herpes zoster virus). The chest wall is composed of bones, muscles, and cartilage. Any of these can be the source of the pain. The bones can be bruised by injury. The muscles or cartilage can be strained by coughing or overwork. The cartilage can be affected by inflammation and become sore (costochondritis). DIAGNOSIS  Lab tests or other studies may be needed to find the cause of your pain. Your health care provider may have you take a test called an ambulatory electrocardiogram (ECG). An ECG records your heartbeat patterns over a 24-hour period. You may also have other tests, such as: Transthoracic echocardiogram (TTE). During echocardiography,  sound waves are used to evaluate how blood flows through your heart. Transesophageal echocardiogram (TEE). Cardiac monitoring. This allows your health care provider to monitor your heart rate and rhythm in real time. Holter monitor. This is a portable device that records your heartbeat and can help diagnose heart arrhythmias. It allows your health care provider to track your heart activity for several days, if needed. Stress tests by exercise or by giving medicine that makes the heart beat faster. TREATMENT  Treatment depends on what may be causing your chest pain. Treatment may include: Acid blockers for heartburn. Anti-inflammatory medicine. Pain medicine for inflammatory conditions. Antibiotics if an infection is present. You may be advised to change lifestyle habits. This includes stopping smoking and avoiding alcohol, caffeine, and chocolate. You may be advised to keep your head raised (elevated) when sleeping. This reduces the chance of acid going backward from your stomach into your esophagus. Most of the time, nonspecific chest pain will improve within 2-3 days with rest and mild pain medicine.  HOME CARE INSTRUCTIONS  If antibiotics were prescribed, take them as directed. Finish them even if you start to feel better. For the next few days, avoid physical activities that bring on chest pain. Continue physical activities as directed. Do not use any tobacco products, including cigarettes, chewing tobacco, or electronic cigarettes. Avoid drinking alcohol. Only take medicine as directed by your health care provider. Follow your health care provider's suggestions for further testing if your chest pain does not go away. Keep any follow-up appointments you made. If you do not go to an appointment, you could develop lasting (chronic) problems with pain. If there is any  problem keeping an appointment, call to reschedule. SEEK MEDICAL CARE IF:  Your chest pain does not go away, even after  treatment. You have a rash with blisters on your chest. You have a fever. SEEK IMMEDIATE MEDICAL CARE IF:  You have increased chest pain or pain that spreads to your arm, neck, jaw, back, or abdomen. You have shortness of breath. You have an increasing cough, or you cough up blood. You have severe back or abdominal pain. You feel nauseous or vomit. You have severe weakness. You faint. You have chills. This is an emergency. Do not wait to see if the pain will go away. Get medical help at once. Call your local emergency services (911 in [place] Do not drive yourself to the hospital. MAKE SURE YOU:  Understand these instructions. Will watch your condition. Will get help right away if you are not doing well or get worse. Document Released: 04/07/2005 Document Revised: 07/03/2013 Document Reviewed: 02/01/2008 [facility] Patient Information 2015 [place], [place]. This information is not intended to replace advice given to you by your health care provider. Make sure you discuss any questions you have with your health care provider.

## 2017-07-14 NOTE — ED Provider Notes (Signed)
[facility] Provider Note   CSN: [id number] Arrival date & time: 07/14/17  0630     History   Chief Complaint Chief Complaint  Patient presents with  . Chest Pain    HPI Kyle Schultz is a 84 y.o. male.  Patient presents to the emergency department for evaluation of chest discomfort.  He was at home alone when the symptoms began.  He reports a sensation of feeling like "something was moving around in my chest".  He did not have any shortness of breath.  But by the time he got to the ER tonight, symptoms have resolved.  No further chest discomfort currently.      Past Medical History:  Diagnosis Date  . CKD (chronic kidney disease), stage I   . Diabetes mellitus   . First degree AV block   . Hyperlipidemia   . Hypertension   . Second degree AV block   . Sinus bradycardia     Patient Active Problem List   Diagnosis Date Noted  . Leukopenia 03/31/2017  . Chest pain 03/31/2017  . SIRS (systemic inflammatory response syndrome) ([place]) 05/31/2016  . Fever 05/31/2016  . Chronic renal insufficiency, stage III (moderate) ([place]) 06/26/2015  . Palpitations 06/26/2015  . SINOATRIAL NODE DYSFUNCTION 08/01/2009  . Cardiac pacemaker in situ 08/01/2009  . Type 2 diabetes mellitus with renal manifestations, controlled ([place]) 03/10/2009  . Dyslipidemia 03/10/2009  . Essential hypertension 03/10/2009    Past Surgical History:  Procedure Laterality Date  . COLONOSCOPY    . colonscopy    . HEMORRHOID SURGERY    . PACEMAKER INSERTION  2010   [facility]  . UMBILICAL HERNIA REPAIR  04/2008       Home Medications    Prior to Admission medications   Medication Sig Start Date End Date Taking? Authorizing Provider  aspirin 81 MG tablet Take 81 mg by mouth every evening.     [provider]  docusate sodium (COLACE) 100 MG capsule Take 100 mg by mouth daily as needed for mild constipation.    [provider]  fenofibrate 160 MG tablet Take 160  mg by mouth every evening.     [provider]  glipiZIDE (GLUCOTROL) 10 MG tablet Take 10 mg by mouth 2 (two) times daily.  02/13/11   [provider]  linagliptin (TRADJENTA) 5 MG TABS tablet Take 5 mg by mouth daily.    [provider]  losartan (COZAAR) 100 MG tablet Take 100 mg by mouth daily.  02/13/11   [provider]  simvastatin (ZOCOR) 20 MG tablet Take 20 mg by mouth every evening.    [provider]    Family History Family History  Problem Relation Age of Onset  . Heart disease Father   . Cancer Sister   . Cancer Sister   . Cancer Sister   . ALS Sister     Social History Social History   Tobacco Use  . Smoking status: Never Smoker  . Smokeless tobacco: Never Used  Substance Use Topics  . Alcohol use: No    Alcohol/week: 0.0 oz  . Drug use: No     Allergies   Other   Review of Systems Review of Systems  Cardiovascular: Positive for chest pain.  All other systems reviewed and are negative.    Physical Exam Updated Vital Signs BP (!) 153/78   Pulse 62   Temp (!) 97.5 F (36.4 C) (Oral)   Resp 16  Ht 6' (1.829 m)   Wt 93.9 kg (207 lb)   SpO2 99%   BMI 28.07 kg/m   Physical Exam  Constitutional: He is oriented to person, place, and time. He appears well-developed and well-nourished. No distress.  HENT:  Head: Normocephalic and atraumatic.  Right Ear: Hearing normal.  Left Ear: Hearing normal.  Nose: Nose normal.  Mouth/Throat: Oropharynx is clear and moist and mucous membranes are normal.  Eyes: Conjunctivae and EOM are normal. Pupils are equal, round, and reactive to light.  Neck: Normal range of motion. Neck supple.  Cardiovascular: Regular rhythm, S1 normal and S2 normal. Exam reveals no gallop and no friction rub.  No murmur heard. Pulmonary/Chest: Effort normal and breath sounds normal. No respiratory distress. He exhibits no tenderness.  Abdominal: Soft. Normal appearance and bowel sounds are  normal. There is no hepatosplenomegaly. There is no tenderness. There is no rebound, no guarding, no tenderness at McBurney's point and negative Murphy's sign. No hernia.  Musculoskeletal: Normal range of motion.  Neurological: He is alert and oriented to person, place, and time. He has normal strength. No cranial nerve deficit or sensory deficit. Coordination normal. GCS eye subscore is 4. GCS verbal subscore is 5. GCS motor subscore is 6.  Skin: Skin is warm, dry and intact. No rash noted. No cyanosis.  Psychiatric: He has a normal mood and affect. His speech is normal and behavior is normal. Thought content normal.  Nursing note and vitals reviewed.    ED Treatments / Results  Labs (all labs ordered are listed, but only abnormal results are displayed) Labs Reviewed  CBC WITH DIFFERENTIAL/PLATELET - Abnormal; Notable for the following components:      Result Value   WBC 3.2 (*)    Hemoglobin 11.6 (*)    HCT 36.8 (*)    MCH 25.2 (*)    Neutro Abs 1.6 (*)    All other components within normal limits  BASIC METABOLIC PANEL - Abnormal; Notable for the following components:   Glucose, Bld 291 (*)    BUN 28 (*)    Creatinine, Ser 1.56 (*)    GFR calc non Af Amer 39 (*)    GFR calc Af Amer 45 (*)    All other components within normal limits  I-STAT TROPONIN, ED    EKG  EKG Interpretation  Date/Time:  Thursday July 14 2017 04:14:32 EST Ventricular Rate:  69 PR Interval:    QRS Duration: 159 QT Interval:  449 QTC Calculation: 481 R Axis:   -163 Text Interpretation:  Atrial-sensed ventricular-paced rhythm No further analysis attempted due to paced rhythm Baseline wander in lead(s) V3 Confirmed by Orpah Greek [phone]) on 07/14/2017 4:16:24 AM       Radiology Dg Chest 2 View  Result Date: 07/14/2017 CLINICAL DATA:  Chest pain. EXAM: CHEST  2 VIEW COMPARISON:  03/31/2017 FINDINGS: Left-sided pacemaker in place. Unchanged heart size and mediastinal contours, heart size  upper normal. No pulmonary edema. No consolidation or pleural effusion. Calcified granuloma at the right lung base. No acute osseous abnormalities. IMPRESSION: No acute pulmonary process. Electronically Signed   By: Jeb Levering M.D.   On: 07/14/2017 04:52    Procedures Procedures (including critical care time)  Medications Ordered in ED Medications - No data to display   Initial Impression / Assessment and Plan / ED Course  I have reviewed the triage vital signs and the nursing notes.  Pertinent labs & imaging results that were available during my care  of the patient were reviewed by me and considered in my medical decision making (see chart for details).     Patient presented to the emergency department for evaluation of chest pain.  He cannot describe the discomfort he was experiencing around.  He did not feel like it was actually pain but more like sensation in his chest.  This was resolved by the time he arrived in the it seems atypical for cardiac etiology, EKG is not helpful because he has ventricular pacing.  His troponin was negative.  Will obtain a second troponin.  While he was here in the ER, however, he had episodes of bradycardia.  Efforts are being made to interrogate his pacemaker to see if it is functioning properly.  Will likely require conversation with cardiology after interrogation is performed.  Will sign out to oncoming ER physician to follow-up on pacemaker interrogation.  Final Clinical Impressions(s) / ED Diagnoses   Final diagnoses:  Chest pain, unspecified type    ED Discharge Orders    None       Orpah Greek, MD 07/14/17 [phone]

## 2017-07-14 NOTE — ED Provider Notes (Signed)
Blood pressure (!) 153/78, pulse 62, temperature (!) 97.5 F (36.4 C), temperature source Oral, resp. rate 16, height 6' (1.829 m), weight 93.9 kg (207 lb), SpO2 99 %.  Assuming care from Dr. Betsey Holiday.  In short, Kyle Schultz is a 84 y.o. male with a chief complaint of Chest Pain .  Refer to the original H&P for additional details.  The current plan of care is to s/u with [facility] pacemaker device interrogation and repeat troponin. CP was very atypical but patient with intermittent bradycardia.  10:24 AM Repeat troponin is negative.  The patient's pacemaker was interrogated and showed that it was working well with no specific arrhythmias appreciated.  Patient with no additional bradycardia events.  Plan to have him follow with his cardiologist as an outpatient.  Believe the patient is safe for discharge from the emergency department at this time.   At this time, I do not feel there is any life-threatening condition present. I have reviewed and discussed all results (EKG, imaging, lab, urine as appropriate), exam findings with patient. I have reviewed nursing notes and appropriate previous records.  I feel the patient is safe to be discharged home without further emergent workup. Discussed usual and customary return precautions. Patient and family (if present) verbalize understanding and are comfortable with this plan.  Patient will follow-up with their primary care provider. If they do not have a primary care provider, information for follow-up has been provided to them. All questions have been answered.   Kyle Quinton, MD Emergency Medicine    Kyle Schultz, Kyle Olds, MD 07/14/17 [phone]

## 2017-07-15 DIAGNOSIS — Z45018 Encounter for adjustment and management of other part of cardiac pacemaker: Secondary | ICD-10-CM | POA: Diagnosis not present

## 2017-07-15 DIAGNOSIS — E119 Type 2 diabetes mellitus without complications: Secondary | ICD-10-CM | POA: Diagnosis not present

## 2017-07-15 DIAGNOSIS — E7849 Other hyperlipidemia: Secondary | ICD-10-CM | POA: Diagnosis not present

## 2017-07-15 DIAGNOSIS — I10 Essential (primary) hypertension: Secondary | ICD-10-CM | POA: Diagnosis not present

## 2017-07-27 NOTE — Progress Notes (Signed)
Cardiology Office Note    Date:  07/28/2017   ID:  Kyle Schultz, DOB Apr 04, 1930, MRN [id number]  PCP:  Rosita Fire, MD  Cardiologist: Dr. Lovena Le  Chief Complaint  Patient presents with  . Hospitalization Follow-up    History of Present Illness:    Kyle Schultz is a 84 y.o. male with past medical history of HTN, HLD, Type 2 DM, and symptomatic bradycardia (s/p PPM placement in 2010) who presents to the office today for follow-up from his recent ED visit.   He was last examined by Dr. Lovena Le in 11/2016 and denied any recent chest pain or dyspnea on exertion at that time. Device interrogation showed normal function and he was continued on his current medication regimen.   In the interim, the patient was admitted to [facility] in 03/2017 for evaluation of chest pain. EKG showed no acute ischemic changes and cyclic troponin values remained negative. His symptoms occurred while he was eating and spontaneously resolved, therefore his presentation was thought to be atypical for a cardiac etiology and he was informed to follow-up in the outpatient setting.   The patient was most recently evaluated in the ED on 07/14/2017 for chest pain which awoke him at 0300. Reported feeling like "something was moving around in his chest". His initial and delta troponin values were negative. His device was interrogated while in the ED and was functioning normally, therefore he was discharged.   In talking with the patient today, he reports 5-6 episodes of chest pain over the past 2-3 months which can occur at rest or with activity. Symptoms can last for 10-20 minutes then spontaneously resolve. No association with food consumption, exertion, or positional changes. He has noticed worsening lower extremity edema over the past few months. Denies any associated orthopnea or PND. Denies any recent lightheadedness, dizziness, or presyncope.    Past Medical History:  Diagnosis Date  . CKD (chronic kidney disease),  stage I   . Diabetes mellitus   . First degree AV block   . Hyperlipidemia   . Hypertension   . Second degree AV block   . Sinus bradycardia     Past Surgical History:  Procedure Laterality Date  . COLONOSCOPY    . colonscopy    . HEMORRHOID SURGERY    . PACEMAKER INSERTION  2010   [facility]  . UMBILICAL HERNIA REPAIR  04/2008    Current Medications: Outpatient Medications Prior to Visit  Medication Sig Dispense Refill  . aspirin 81 MG tablet Take 81 mg by mouth every evening.     . docusate sodium (COLACE) 100 MG capsule Take 100 mg by mouth daily as needed for mild constipation.    . fenofibrate 160 MG tablet Take 160 mg by mouth every evening.     Marland Kitchen glipiZIDE (GLUCOTROL) 10 MG tablet Take 10 mg by mouth 2 (two) times daily.     Marland Kitchen linagliptin (TRADJENTA) 5 MG TABS tablet Take 5 mg by mouth daily.    Marland Kitchen losartan (COZAAR) 100 MG tablet Take 100 mg by mouth daily.     . simvastatin (ZOCOR) 20 MG tablet Take 20 mg by mouth every evening.     No facility-administered medications prior to visit.      Allergies:   Other   Social History   Socioeconomic History  . Marital status: Widowed    Spouse name: None  . Number of children: None  . Years of education: None  . Highest education level:  None  Social Needs  . Financial resource strain: None  . Food insecurity - worry: None  . Food insecurity - inability: None  . Transportation needs - medical: None  . Transportation needs - non-medical: None  Occupational History  . Occupation: Retired, Optometrist Tobacco  Tobacco Use  . Smoking status: Never Smoker  . Smokeless tobacco: Never Used  Substance and Sexual Activity  . Alcohol use: No    Alcohol/week: 0.0 oz  . Drug use: No  . Sexual activity: No    Birth control/protection: Abstinence  Other Topics Concern  . None  Social History Narrative   Widowed   1 child   Walks daily     Family History:  The patient's family history includes ALS in his sister; Cancer in  his sister, sister, and sister; Heart disease in his father.   Review of Systems:   Please see the history of present illness.     General:  No chills, fever, night sweats or weight changes.  Cardiovascular:  No dyspnea on exertion, orthopnea, palpitations, paroxysmal nocturnal dyspnea. Positive for chest pain and edema.  Dermatological: No rash, lesions/masses Respiratory: No cough, dyspnea Urologic: No hematuria, dysuria Abdominal:   No nausea, vomiting, diarrhea, bright red blood per rectum, melena, or hematemesis Neurologic:  No visual changes, wkns, changes in mental status. All other systems reviewed and are otherwise negative except as noted above.   Physical Exam:    VS:  BP 130/80 (BP Location: Right Arm)   Pulse 83   Ht 6' (1.829 m)   Wt 206 lb (93.4 kg)   SpO2 99%   BMI 27.94 kg/m    General: Well developed, elderly African American male appearing in no acute distress. Head: Normocephalic, atraumatic, sclera non-icteric, no xanthomas, nares are without discharge.  Neck: No carotid bruits. JVD not elevated.  Lungs: Respirations regular and unlabored, without wheezes or rales.  Heart: Regular rate and rhythm. No S3 or S4.  No murmur, no rubs, or gallops appreciated. Abdomen: Soft, non-tender, non-distended with normoactive bowel sounds. No hepatomegaly. No rebound/guarding. No obvious abdominal masses. Msk:  Strength and tone appear normal for age. No joint deformities or effusions. Extremities: No clubbing or cyanosis. 1+ pitting edema along LLE, trace edema along RLE.  Distal pedal pulses are 2+ bilaterally. Neuro: Alert and oriented X 3. Moves all extremities spontaneously. No focal deficits noted. Psych:  Responds to questions appropriately with a normal affect. Skin: No rashes or lesions noted  Wt Readings from Last 3 Encounters:  07/28/17 206 lb (93.4 kg)  07/14/17 207 lb (93.9 kg)  03/31/17 212 lb 0 oz (96.2 kg)     Studies/Labs Reviewed:   EKG:  EKG is not  ordered today.  EKG from 07/14/2017 is reviewed which shows A-sensed, V-paced, HR 69.  Recent Labs: 10/19/2016: ALT 26 07/14/2017: BUN 28; Creatinine, Ser 1.56; Hemoglobin 11.6; Platelets 190; Potassium 4.1; Sodium 139   Lipid Panel    Component Value Date/Time   CHOL 204 (H) 08/01/2009 1829   TRIG 167 (H) 08/01/2009 1829   HDL 48 08/01/2009 1829   CHOLHDL 4.3 Ratio 08/01/2009 1829   VLDL 33 08/01/2009 1829   LDLCALC 123 (H) 08/01/2009 1829    Additional studies/ records that were reviewed today include:   CXR: 07/14/2017 FINDINGS: Left-sided pacemaker in place. Unchanged heart size and mediastinal contours, heart size upper normal. No pulmonary edema. No consolidation or pleural effusion. Calcified granuloma at the right lung base. No acute osseous abnormalities.  IMPRESSION: No acute pulmonary process.  Assessment:    1. Chest pain, unspecified type   2. Edema, lower extremity   3. Complete heart block ([place])   4. Essential hypertension      Plan:   In order of problems listed above:  1. Atypical Chest Pain  - the patient has no known history of CAD but has multiple risk factors including HTN, HLD, and Type 2 DM. Was admitted in 03/2017 for evaluation of chest pain which occurred while eating with EKG showing no acute ischemic changes and cyclic troponin values remaining negative. Again evaluated in the ED on 07/14/2017 for chest pain that felt like "something was moving around in his chest". His initial and delta troponin values were negative and device interrogation showed no acute abnormalities.  - overall, his symptoms seem atypical for a cardiac etiology but with his recurrent symptoms, will check an echocardiogram for initial evaluation to assess LV function and wall motion.  2. Lower Extremity Edema - reports having edema for the past few months. Likely due to dependent edema in the setting of decreased activity. Denies any associated orthopnea or dyspnea. Will obtain  echocardiogram as outlined above.   3. CHB - s/p PPM placement in 2010. Most recent device interrogation in the ED showed normal device function.  - followed by Dr. Lovena Le.   4. HTN - BP is well-controlled at 130/80 during today's visit. - continue Losartan 100mg  daily. Most recent blood work showed stable kidney function (baseline creatinine 1.4 - 1.5).     Medication Adjustments/Labs and Tests Ordered: Current medicines are reviewed at length with the patient today.  Concerns regarding medicines are outlined above.  Medication changes, Labs and Tests ordered today are listed in the Patient Instructions below. Patient Instructions  Medication Instructions:  Your physician recommends that you continue on your current medications as directed. Please refer to the Current Medication list given to you today.  Labwork: NONE   Testing/Procedures: ECHOCARDIOGRAM  Follow-Up: Your physician recommends that you schedule a follow-up appointment in: 3 Months   Any Other Special Instructions Will Be Listed Below (If Applicable).  If you need a refill on your cardiac medications before your next appointment, please call your pharmacy.  Thank you for choosing [facility]!    Signed, Erma Heritage, PA-C  07/28/2017 4:58 PM    [facility] [address] [place], [place] [address] Phone: [phone]

## 2017-07-28 ENCOUNTER — Ambulatory Visit (INDEPENDENT_AMBULATORY_CARE_PROVIDER_SITE_OTHER): Payer: Medicare Other | Admitting: Student

## 2017-07-28 ENCOUNTER — Encounter: Payer: Self-pay | Admitting: Student

## 2017-07-28 VITALS — BP 130/80 | HR 83 | Ht 72.0 in | Wt 206.0 lb

## 2017-07-28 DIAGNOSIS — R600 Localized edema: Secondary | ICD-10-CM

## 2017-07-28 DIAGNOSIS — I442 Atrioventricular block, complete: Secondary | ICD-10-CM

## 2017-07-28 DIAGNOSIS — I10 Essential (primary) hypertension: Secondary | ICD-10-CM

## 2017-07-28 DIAGNOSIS — R079 Chest pain, unspecified: Secondary | ICD-10-CM | POA: Diagnosis not present

## 2017-07-28 NOTE — Patient Instructions (Signed)
Medication Instructions:  Your physician recommends that you continue on your current medications as directed. Please refer to the Current Medication list given to you today.   Labwork: NONE   Testing/Procedures: NONE   Follow-Up: Your physician recommends that you schedule a follow-up appointment in: 3 Months    Any Other Special Instructions Will Be Listed Below (If Applicable).     If you need a refill on your cardiac medications before your next appointment, please call your pharmacy.  Thank you for choosing [facility]!   

## 2017-08-03 ENCOUNTER — Ambulatory Visit (HOSPITAL_COMMUNITY)
Admission: RE | Admit: 2017-08-03 | Discharge: 2017-08-03 | Disposition: A | Discharge status: Home / Self Care | Payer: Medicare Other | Source: Ambulatory Visit | Attending: Student | Admitting: Student

## 2017-08-03 DIAGNOSIS — I77819 Aortic ectasia, unspecified site: Secondary | ICD-10-CM | POA: Insufficient documentation

## 2017-08-03 DIAGNOSIS — E1122 Type 2 diabetes mellitus with diabetic chronic kidney disease: Secondary | ICD-10-CM | POA: Insufficient documentation

## 2017-08-03 DIAGNOSIS — E785 Hyperlipidemia, unspecified: Secondary | ICD-10-CM | POA: Insufficient documentation

## 2017-08-03 DIAGNOSIS — R600 Localized edema: Secondary | ICD-10-CM | POA: Diagnosis not present

## 2017-08-03 DIAGNOSIS — R609 Edema, unspecified: Secondary | ICD-10-CM | POA: Diagnosis not present

## 2017-08-03 DIAGNOSIS — R079 Chest pain, unspecified: Secondary | ICD-10-CM | POA: Diagnosis not present

## 2017-08-03 DIAGNOSIS — N183 Chronic kidney disease, stage 3 (moderate): Secondary | ICD-10-CM | POA: Diagnosis not present

## 2017-08-03 DIAGNOSIS — I351 Nonrheumatic aortic (valve) insufficiency: Secondary | ICD-10-CM | POA: Diagnosis not present

## 2017-08-03 DIAGNOSIS — I129 Hypertensive chronic kidney disease with stage 1 through stage 4 chronic kidney disease, or unspecified chronic kidney disease: Secondary | ICD-10-CM | POA: Insufficient documentation

## 2017-08-03 DIAGNOSIS — I495 Sick sinus syndrome: Secondary | ICD-10-CM | POA: Diagnosis not present

## 2017-08-03 LAB — ECHOCARDIOGRAM COMPLETE
AVLVOTPG: 2 mmHg
CHL CUP DOP CALC LVOT VTI: 18.8 cm
CHL CUP TV REG PEAK VELOCITY: 234 cm/s
E/e' ratio: 9.01
EWDT: 296 ms
FS: 31 % (ref 28–44)
IV/PV OW: 1.14
LA ID, A-P, ES: 42 mm
LA diam end sys: 42 mm
LA vol index: 28.9 mL/m2
LA vol: 63.5 mL
LADIAMINDEX: 1.91 cm/m2
LAVOLA4C: 68.7 mL
LDCA: 3.8 cm2
LV E/e' medial: 9.01
LV E/e'average: 9.01
LV PW d: 11 mm — AB (ref 0.6–1.1)
LV SIMPSON'S DISK: 53
LV TDI E'LATERAL: 5.44
LV TDI E'MEDIAL: 4.46
LV dias vol index: 40 mL/m2
LV dias vol: 87 mL (ref 62–150)
LV e' LATERAL: 5.44 cm/s
LV sys vol: 41 mL
LVOT SV: 71 mL
LVOT peak vel: 78.8 cm/s
LVOTD: 22 mm
LVSYSVOLIN: 19 mL/m2
MV Dec: 296
MV pk E vel: 49 m/s
MVPKAVEL: 63 m/s
P 1/2 time: 641 ms
RV LATERAL S' VELOCITY: 16.6 cm/s
RV sys press: 25 mmHg
Stroke v: 46 ml
TAPSE: 23.5 mm
TRMAXVEL: 234 cm/s

## 2017-08-03 NOTE — Progress Notes (Signed)
*  PRELIMINARY RESULTS* Echocardiogram 2D Echocardiogram has been performed.  Samuel Germany 08/03/2017, 11:14 AM

## 2017-08-09 DIAGNOSIS — E1142 Type 2 diabetes mellitus with diabetic polyneuropathy: Secondary | ICD-10-CM | POA: Diagnosis not present

## 2017-08-09 DIAGNOSIS — B351 Tinea unguium: Secondary | ICD-10-CM | POA: Diagnosis not present

## 2017-08-11 ENCOUNTER — Ambulatory Visit (INDEPENDENT_AMBULATORY_CARE_PROVIDER_SITE_OTHER): Payer: Self-pay | Admitting: "Endocrinology

## 2017-08-11 ENCOUNTER — Encounter: Payer: Self-pay | Admitting: "Endocrinology

## 2017-08-11 VITALS — BP 149/81 | HR 76 | Ht 72.0 in | Wt 206.0 lb

## 2017-08-11 DIAGNOSIS — E1165 Type 2 diabetes mellitus with hyperglycemia: Secondary | ICD-10-CM

## 2017-08-11 DIAGNOSIS — E1122 Type 2 diabetes mellitus with diabetic chronic kidney disease: Secondary | ICD-10-CM

## 2017-08-11 DIAGNOSIS — IMO0002 Reserved for concepts with insufficient information to code with codable children: Secondary | ICD-10-CM | POA: Insufficient documentation

## 2017-08-11 DIAGNOSIS — N183 Chronic kidney disease, stage 3 (moderate): Secondary | ICD-10-CM

## 2017-08-11 DIAGNOSIS — E1159 Type 2 diabetes mellitus with other circulatory complications: Secondary | ICD-10-CM | POA: Insufficient documentation

## 2017-08-11 MED ORDER — GLUCOSE BLOOD VI STRP: 1.0000 | ORAL_STRIP | Freq: Two times a day (BID) | 5 refills | Status: DC

## 2017-08-11 NOTE — Progress Notes (Signed)
Consult Note       08/11/2017, 5:16 PM   Subjective:    Patient ID: Kyle Schultz, male    DOB: 12/12/1929.  Kyle Schultz is being seen in consultation for management of currently uncontrolled symptomatic diabetes requested by  Rosita Fire, MD.   Past Medical History:  Diagnosis Date  . CKD (chronic kidney disease), stage I   . Diabetes mellitus   . First degree AV block   . Hyperlipidemia   . Hypertension   . Second degree AV block   . Sinus bradycardia    Past Surgical History:  Procedure Laterality Date  . COLONOSCOPY    . colonscopy    . HEMORRHOID SURGERY    . PACEMAKER INSERTION  2010   [facility]  . UMBILICAL HERNIA REPAIR  04/2008   Social History   Socioeconomic History  . Marital status: Widowed    Spouse name: None  . Number of children: None  . Years of education: None  . Highest education level: None  Social Needs  . Financial resource strain: None  . Food insecurity - worry: None  . Food insecurity - inability: None  . Transportation needs - medical: None  . Transportation needs - non-medical: None  Occupational History  . Occupation: Retired, Optometrist Tobacco  Tobacco Use  . Smoking status: Never Smoker  . Smokeless tobacco: Never Used  Substance and Sexual Activity  . Alcohol use: No    Alcohol/week: 0.0 oz  . Drug use: No  . Sexual activity: No    Birth control/protection: Abstinence  Other Topics Concern  . None  Social History Narrative   Widowed   1 child   Walks daily   Outpatient Encounter Medications as of 08/11/2017  Medication Sig  . aspirin 81 MG tablet Take 81 mg by mouth every evening.   . docusate sodium (COLACE) 100 MG capsule Take 100 mg by mouth daily as needed for mild constipation.  . fenofibrate 160 MG tablet Take 160 mg by mouth every evening.   Marland Kitchen glipiZIDE (GLUCOTROL) 10 MG tablet Take 10 mg by mouth 2 (two) times daily.   Marland Kitchen glucose  blood test strip 1 each by Other route 2 (two) times daily. Use as instructed 2 x daily. E11.65 EZ MAX  . linagliptin (TRADJENTA) 5 MG TABS tablet Take 5 mg by mouth daily.  Marland Kitchen losartan (COZAAR) 100 MG tablet Take 100 mg by mouth daily.   . simvastatin (ZOCOR) 20 MG tablet Take 20 mg by mouth every evening.  . [DISCONTINUED] glucose blood test strip 1 each by Other route 2 (two) times daily. Use as instructed 2 x daily. E11.65 EZ MAX   No facility-administered encounter medications on file as of 08/11/2017.     ALLERGIES: Allergies  Allergen Reactions  . Other     "some kind of fluid pill I can't take"     VACCINATION STATUS: Immunization History  Administered Date(s) Administered  . Influenza, High Dose Seasonal PF 04/01/2017    Diabetes  He presents for his initial diabetic visit. He has type 2 diabetes mellitus. Onset time: Patient is not  sure when he was diagnosed with diabetes, at approximate age of 48 years. His disease course has been worsening. There are no hypoglycemic associated symptoms. Pertinent negatives for hypoglycemia include no confusion, headaches, pallor or seizures. Associated symptoms include blurred vision, polydipsia and polyuria. Pertinent negatives for diabetes include no chest pain, no fatigue, no polyphagia and no weakness. There are no hypoglycemic complications. Symptoms are worsening. Diabetic complications include nephropathy. Risk factors for coronary artery disease include dyslipidemia, diabetes mellitus, hypertension and male sex. Current diabetic treatments: He is on glipizide 10 mg p.o. twice daily, Tradjenta 5 mg p.o. once a day. His weight is stable. He is following a generally unhealthy diet. When asked about meal planning, he reported none. He has not had a previous visit with a dietitian. He never participates in exercise. His overall blood glucose range is >200 mg/dl. (Brought a log book showing persistently above 200 mg per DL blood glucose readings in  the morning.) An ACE inhibitor/angiotensin II receptor blocker is being taken. He does not see a podiatrist.Eye exam is current.  Hyperlipidemia  This is a chronic problem. The problem is controlled. Exacerbating diseases include diabetes. Pertinent negatives include no chest pain, myalgias or shortness of breath. Current antihyperlipidemic treatment includes statins. Risk factors for coronary artery disease include dyslipidemia, diabetes mellitus, hypertension, male sex and a sedentary lifestyle.  Hypertension  This is a chronic problem. The current episode started more than 1 year ago. The problem is uncontrolled. Associated symptoms include blurred vision. Pertinent negatives include no chest pain, headaches, neck pain, palpitations or shortness of breath. Past treatments include angiotensin blockers. Hypertensive end-organ damage includes kidney disease.      Review of Systems  Constitutional: Negative for chills, fatigue, fever and unexpected weight change.  HENT: Negative for dental problem, mouth sores and trouble swallowing.   Eyes: Positive for blurred vision. Negative for visual disturbance.  Respiratory: Negative for cough, choking, chest tightness, shortness of breath and wheezing.   Cardiovascular: Negative for chest pain, palpitations and leg swelling.  Gastrointestinal: Negative for abdominal distention, abdominal pain, constipation, diarrhea, nausea and vomiting.  Endocrine: Positive for polydipsia and polyuria. Negative for polyphagia.  Genitourinary: Negative for dysuria, flank pain, hematuria and urgency.  Musculoskeletal: Negative for back pain, gait problem, myalgias and neck pain.  Skin: Negative for pallor, rash and wound.  Neurological: Negative for seizures, syncope, weakness, numbness and headaches.  Psychiatric/Behavioral: Negative for confusion and dysphoric mood.    Objective:    BP (!) 149/81   Pulse 76   Ht 6' (1.829 m)   Wt 206 lb (93.4 kg)   BMI 27.94  kg/m   Wt Readings from Last 3 Encounters:  08/11/17 206 lb (93.4 kg)  07/28/17 206 lb (93.4 kg)  07/14/17 207 lb (93.9 kg)     Physical Exam  Constitutional: He is oriented to person, place, and time. He appears well-developed. He is cooperative. No distress.  HENT:  Head: Normocephalic and atraumatic.  Eyes: EOM are normal.  Neck: Normal range of motion. Neck supple. No tracheal deviation present. No thyromegaly present.  Cardiovascular: Normal rate, S1 normal, S2 normal and normal heart sounds. Exam reveals no gallop.  No murmur heard. Pulses:      Dorsalis pedis pulses are 1+ on the right side, and 1+ on the left side.       Posterior tibial pulses are 1+ on the right side, and 1+ on the left side.  Pulmonary/Chest: Breath sounds normal. No respiratory distress. He has  no wheezes.  Abdominal: Soft. Bowel sounds are normal. He exhibits no distension. There is no tenderness. There is no guarding and no CVA tenderness.  Musculoskeletal: He exhibits no edema.       Right shoulder: He exhibits no swelling and no deformity.  Neurological: He is alert and oriented to person, place, and time. He has normal strength and normal reflexes. No cranial nerve deficit or sensory deficit. Gait normal.  Patient is significantly hard of hearing.  Skin: Skin is warm and dry. No rash noted. No cyanosis. Nails show no clubbing.  Psychiatric: He has a normal mood and affect. His speech is normal. Cognition and memory are normal.      CMP ( most recent) CMP     Component Value Date/Time   NA 139 07/14/2017 0415   K 4.1 07/14/2017 0415   CL 105 07/14/2017 0415   CO2 26 07/14/2017 0415   GLUCOSE 291 (H) 07/14/2017 0415   BUN 28 (H) 07/14/2017 0415   CREATININE 1.56 (H) 07/14/2017 0415   CALCIUM 9.7 07/14/2017 0415   PROT 6.7 10/19/2016 2159   ALBUMIN 3.8 10/19/2016 2159   AST 39 10/19/2016 2159   ALT 26 10/19/2016 2159   ALKPHOS 44 10/19/2016 2159   BILITOT 0.7 10/19/2016 2159   GFRNONAA  39 (L) 07/14/2017 0415   GFRAA 45 (L) 07/14/2017 0415     Diabetic Labs (most recent): Lab Results  Component Value Date   HGBA1C 10.5 07/07/2017   HGBA1C 8.0 (H) 05/31/2016   HGBA1C  01/19/2009    6.0 (NOTE) The ADA recommends the following therapeutic goal for glycemic control related to Hgb A1c measurement: Goal of therapy: <6.5 Hgb A1c  Reference: American Diabetes Association: Clinical Practice Recommendations 2010, Diabetes Care, 2010, 33: (Suppl  1).     Lipid Panel ( most recent) Lipid Panel     Component Value Date/Time   CHOL 130 07/07/2017   TRIG 101 07/07/2017   HDL 55 07/07/2017   CHOLHDL 4.3 Ratio 08/01/2009 1829   VLDL 33 08/01/2009 1829   LDLCALC 57 07/07/2017    Results for Kyle Schultz, Kyle Schultz (MRN [id number]) as of 08/11/2017 14:39  Ref. Range 06/26/2009 00:00 06/26/2009 21:58  TSH Latest Ref Range: 0.350 - 4.500 microintl units/mL 1.600 1.600     Assessment & Plan:   1. Uncontrolled type 2 diabetes mellitus with stage 3 chronic kidney disease ([place]), conduction abnormality of the heart - Kyle Schultz has currently uncontrolled symptomatic type 2 DM since approximately 84 years of age,  with most recent A1c of 10.5 % increasing from 8%. - Recent labs reviewed, showing stage 3-4 renal insufficiency.  -his diabetes is complicated by stage 3-4 renal insufficiency, lack of adequate social support and NOHA KARASIK remains at a high risk for more acute and chronic complications which include CAD, CVA, CKD, retinopathy, and neuropathy. These are all discussed in detail with the patient.  - I have counseled him on diet management  by adopting a carbohydrate restricted/protein rich diet.  - Suggestion is made for him to avoid simple carbohydrates  from his diet including Cakes, Sweet Desserts, Ice Cream, Soda (diet and regular), Sweet Tea, Candies, Chips, Cookies, Store Bought Juices, Alcohol in Excess of  1-2 drinks a day, Artificial Sweeteners, and "Sugar-free"  Products. This will help patient to have stable blood glucose profile and potentially avoid unintended weight gain.  - I encouraged him to switch to  unprocessed or minimally processed complex starch and increased protein  intake (animal or plant source), fruits, and vegetables.  - he is advised to stick to a routine mealtimes to eat 3 meals  a day and avoid unnecessary snacks ( to snack only to correct hypoglycemia).   - I have approached him with the following individualized plan to manage diabetes and patient agrees:   -Given his current glycemic burden, he would require insulin treatment.  However, he is at particularly high risk for hypoglycemia from inadvertent/improper use of insulin.  -He does not have adequate support at home.  -I will proceed to maximize non-insulin treatment for him.  -Advised him to continue glipizide 10 mg p.o. twice daily with breakfast and supper and continue Tradjenta 5 mg p.o. daily with breakfast.  -He agrees to continue monitoring blood glucose at least twice a day-daily before breakfast and before he is going to bed .  -Patient is encouraged to call clinic for blood glucose levels less than 70 or above 300 mg /dl.  -Patient is not a candidate for SGLT2 inhibitors due to CKD.  - Patient specific target  A1c;  LDL, HDL, Triglycerides, and  Waist Circumference were discussed in detail.  2) BP/HTN: His blood pressure is slightly above target, however he does not need additional therapy.  I have advised him to continue his current blood pressure medications including ARB. 3) Lipids/HPL: His lipid panel from December is favorable with LDL of 57.  He may not benefit from the statins any longer.  He would be considered for discontinuation of statin at next visit. 4)  Weight/Diet: CDE Consult will be initiated , exercise, and detailed carbohydrates information provided.  5) Chronic Care/Health Maintenance:  -he  is on ACEI/ARB and Statin medications and  is  encouraged to continue to follow up with Ophthalmology, Dentist,  Podiatrist at least yearly or according to recommendations, and advised to stay away from smoking. I have recommended yearly flu vaccine and pneumonia vaccination at least every 5 years; moderate intensity exercise for up to 150 minutes weekly; and  sleep for at least 7 hours a day.  - I advised patient to maintain close follow up with Rosita Fire, MD for primary care needs.  - Time spent with the patient: 1 hour, of which >50% was spent in obtaining information about his symptoms, reviewing his previous labs, evaluations, and treatments, counseling him about his complicated uncontrolled type 2 diabetes, hyperlipidemia, hypertension and developing a plan for long term treatment; his  questions were answered to his satisfaction.  Follow up plan: - Return in about 10 weeks (around 10/20/2017) for follow up with pre-visit labs, meter, and logs.  Glade Lloyd, MD [facility] [address] [place], [place] [address] Phone: [phone]  Fax: [phone]    08/11/2017, 5:16 PM  This note was partially dictated with voice recognition software. Similar sounding words can be transcribed inadequately or may not  be corrected upon review.

## 2017-09-07 ENCOUNTER — Emergency Department (HOSPITAL_COMMUNITY)
Admission: EM | Admit: 2017-09-07 | Discharge: 2017-09-07 | Disposition: A | Discharge status: Home / Self Care | Payer: Medicare Other | Attending: Emergency Medicine | Admitting: Emergency Medicine

## 2017-09-07 ENCOUNTER — Other Ambulatory Visit: Payer: Self-pay

## 2017-09-07 ENCOUNTER — Encounter (HOSPITAL_COMMUNITY): Payer: Self-pay | Admitting: Emergency Medicine

## 2017-09-07 DIAGNOSIS — E162 Hypoglycemia, unspecified: Secondary | ICD-10-CM | POA: Insufficient documentation

## 2017-09-07 DIAGNOSIS — Z5321 Procedure and treatment not carried out due to patient leaving prior to being seen by health care provider: Secondary | ICD-10-CM | POA: Insufficient documentation

## 2017-09-07 LAB — CBG MONITORING, ED: Glucose-Capillary: 74 mg/dL (ref 65–99)

## 2017-09-07 NOTE — ED Triage Notes (Addendum)
Pt states checked sugar at home it was 61. Ate snack and came up to 65. Pt drove himself up to hospital. Denies other symptoms. Pt states he came because doctor told him if it gets below 70 to come to hospital. Sugar currently 74

## 2017-09-07 NOTE — ED Notes (Signed)
Pt states "sugar okay now"/ informed he could stay to been seen by MD. Pt decided to leave. Pt left walking in NAD.

## 2017-09-08 NOTE — ED Notes (Signed)
09/07/2017, 16:41  Follow-up call completed.

## 2017-09-24 ENCOUNTER — Encounter (HOSPITAL_COMMUNITY): Payer: Self-pay | Admitting: Emergency Medicine

## 2017-09-24 ENCOUNTER — Emergency Department (HOSPITAL_COMMUNITY): Payer: Medicare Other

## 2017-09-24 ENCOUNTER — Emergency Department (HOSPITAL_COMMUNITY)
Admission: EM | Admit: 2017-09-24 | Discharge: 2017-09-24 | Disposition: A | Discharge status: Home / Self Care | Payer: Medicare Other | Attending: Emergency Medicine | Admitting: Emergency Medicine

## 2017-09-24 ENCOUNTER — Other Ambulatory Visit: Payer: Self-pay

## 2017-09-24 DIAGNOSIS — I129 Hypertensive chronic kidney disease with stage 1 through stage 4 chronic kidney disease, or unspecified chronic kidney disease: Secondary | ICD-10-CM | POA: Diagnosis not present

## 2017-09-24 DIAGNOSIS — Z79899 Other long term (current) drug therapy: Secondary | ICD-10-CM | POA: Diagnosis not present

## 2017-09-24 DIAGNOSIS — R002 Palpitations: Secondary | ICD-10-CM | POA: Diagnosis not present

## 2017-09-24 DIAGNOSIS — E119 Type 2 diabetes mellitus without complications: Secondary | ICD-10-CM | POA: Diagnosis not present

## 2017-09-24 DIAGNOSIS — N181 Chronic kidney disease, stage 1: Secondary | ICD-10-CM | POA: Insufficient documentation

## 2017-09-24 LAB — COMPREHENSIVE METABOLIC PANEL
ALBUMIN: 4.2 g/dL (ref 3.5–5.0)
ALK PHOS: 43 U/L (ref 38–126)
ALT: 24 U/L (ref 17–63)
ANION GAP: 9 (ref 5–15)
AST: 30 U/L (ref 15–41)
BILIRUBIN TOTAL: 0.7 mg/dL (ref 0.3–1.2)
BUN: 48 mg/dL — ABNORMAL HIGH (ref 6–20)
CALCIUM: 10 mg/dL (ref 8.9–10.3)
CO2: 25 mmol/L (ref 22–32)
Chloride: 109 mmol/L (ref 101–111)
Creatinine, Ser: 1.7 mg/dL — ABNORMAL HIGH (ref 0.61–1.24)
GFR calc Af Amer: 41 mL/min — ABNORMAL LOW (ref >60)
GFR, EST NON AFRICAN AMERICAN: 35 mL/min — AB (ref >60)
GLUCOSE: 138 mg/dL — AB (ref 65–99)
Potassium: 5 mmol/L (ref 3.5–5.1)
Sodium: 143 mmol/L (ref 135–145)
TOTAL PROTEIN: 7.6 g/dL (ref 6.5–8.1)

## 2017-09-24 LAB — TROPONIN I

## 2017-09-24 LAB — CBC WITH DIFFERENTIAL/PLATELET
BASOS ABS: 0 10*3/uL (ref 0.0–0.1)
BASOS PCT: 1 %
EOS PCT: 9 %
Eosinophils Absolute: 0.4 10*3/uL (ref 0.0–0.7)
HCT: 37.1 % — ABNORMAL LOW (ref 39.0–52.0)
Hemoglobin: 11.5 g/dL — ABNORMAL LOW (ref 13.0–17.0)
Lymphocytes Relative: 38 %
Lymphs Abs: 1.5 10*3/uL (ref 0.7–4.0)
MCH: 25.3 pg — ABNORMAL LOW (ref 26.0–34.0)
MCHC: 31 g/dL (ref 30.0–36.0)
MCV: 81.5 fL (ref 78.0–100.0)
Monocytes Absolute: 0.3 10*3/uL (ref 0.1–1.0)
Monocytes Relative: 6 %
NEUTROS ABS: 1.9 10*3/uL (ref 1.7–7.7)
Neutrophils Relative %: 46 %
PLATELETS: 232 10*3/uL (ref 150–400)
RBC: 4.55 MIL/uL (ref 4.22–5.81)
RDW: 15.2 % (ref 11.5–15.5)
WBC: 4 10*3/uL (ref 4.0–10.5)

## 2017-09-24 NOTE — ED Notes (Signed)
Pt ambulatory to waiting room. Pt verbalized understanding of discharge instructions.   

## 2017-09-24 NOTE — ED Triage Notes (Signed)
PT c/o palpitations felt in his chest/upper back that started about 30 minutes prior to ED arrival. PT denies any CP but states he feels generalized weakness today.

## 2017-09-24 NOTE — Discharge Instructions (Signed)
Follow-up with your family doctor this week for recheck 

## 2017-09-24 NOTE — ED Provider Notes (Signed)
[facility] Provider Note   CSN: [id number] Arrival date & time: 09/24/17  1736     History   Chief Complaint Chief Complaint  Patient presents with  . Palpitations    HPI [name] is a 84 y.o. male.  Patient states that he had some palpitations for a few minutes with some weakness.  He feels fine now   The history is provided by the patient. No language interpreter was used.  Palpitations   This is a new problem. The current episode started less than 1 hour ago. The problem occurs rarely. The problem has been resolved. The problem is associated with an unknown factor. Pertinent negatives include no diaphoresis, no chest pain, no abdominal pain, no headaches, no back pain and no cough.    Past Medical History:  Diagnosis Date  . CKD (chronic kidney disease), stage I   . Diabetes mellitus   . First degree AV block   . Hyperlipidemia   . Hypertension   . Second degree AV block   . Sinus bradycardia     Patient Active Problem List   Diagnosis Date Noted  . Uncontrolled type 2 diabetes mellitus with stage 3 chronic kidney disease ([place]) 08/11/2017  . Type 2 diabetes mellitus with other circulatory complications ([place]) 02/72/5366  . Leukopenia 03/31/2017  . Chest pain 03/31/2017  . SIRS (systemic inflammatory response syndrome) ([place]) 05/31/2016  . Fever 05/31/2016  . Chronic renal insufficiency, stage III (moderate) ([place]) 06/26/2015  . Palpitations 06/26/2015  . SINOATRIAL NODE DYSFUNCTION 08/01/2009  . Cardiac pacemaker in situ 08/01/2009  . Dyslipidemia 03/10/2009  . Essential hypertension 03/10/2009    Past Surgical History:  Procedure Laterality Date  . COLONOSCOPY    . colonscopy    . HEMORRHOID SURGERY    . [facility]AKER INSERTION  2010   [facility]  . UMBILICAL HERNIA REPAIR  04/2008       Home Medications    Prior to Admission medications   Medication Sig Start Date End Date Taking? Authorizing Provider  aspirin 81 MG tablet  Take 81 mg by mouth every evening.    Yes [provider]  docusate sodium (COLACE) 100 MG capsule Take 100 mg by mouth daily as needed for mild constipation.   Yes [provider]  fenofibrate 160 MG tablet Take 160 mg by mouth every evening.    Yes [provider]  glipiZIDE (GLUCOTROL) 10 MG tablet Take 10 mg by mouth 2 (two) times daily.  02/13/11  Yes [provider]  glucose blood test strip 1 each by Other route 2 (two) times daily. Use as instructed 2 x daily. E11.65 EZ MAX 08/11/17  Yes Nida, Marella Chimes, MD  linagliptin (TRADJENTA) 5 MG TABS tablet Take 5 mg by mouth daily.   Yes [provider]  losartan (COZAAR) 100 MG tablet Take 100 mg by mouth daily.  02/13/11  Yes [provider]  simvastatin (ZOCOR) 20 MG tablet Take 20 mg by mouth every evening.   Yes [provider]    Family History Family History  Problem Relation Age of Onset  . Heart disease Father   . Cancer Sister   . Cancer Sister   . Cancer Sister   . ALS Sister     Social History Social History   Tobacco Use  . Smoking status: Never Smoker  . Smokeless tobacco: Never Used  Substance Use Topics  . Alcohol use: No    Alcohol/week: 0.0 oz  .  Drug use: No     Allergies   Other   Review of Systems Review of Systems  Constitutional: Negative for appetite change, diaphoresis and fatigue.  HENT: Negative for congestion, ear discharge and sinus pressure.   Eyes: Negative for discharge.  Respiratory: Negative for cough.   Cardiovascular: Positive for palpitations. Negative for chest pain.  Gastrointestinal: Negative for abdominal pain and diarrhea.  Genitourinary: Negative for frequency and hematuria.  Musculoskeletal: Negative for back pain.  Skin: Negative for rash.  Neurological: Negative for seizures and headaches.  Psychiatric/Behavioral: Negative for hallucinations.     Physical Exam Updated Vital Signs BP 122/90   Pulse 62    Temp 97.8 F (36.6 C) (Oral)   Resp 18   Ht 6' (1.829 m)   Wt 95.3 kg (210 lb)   SpO2 99%   BMI 28.48 kg/m   Physical Exam  Constitutional: He is oriented to person, place, and time. He appears well-developed.  HENT:  Head: Normocephalic.  Eyes: Conjunctivae and EOM are normal. No scleral icterus.  Neck: Neck supple. No thyromegaly present.  Cardiovascular: Normal rate and regular rhythm. Exam reveals no gallop and no friction rub.  No murmur heard. Pulmonary/Chest: No stridor. He has no wheezes. He has no rales. He exhibits no tenderness.  Abdominal: He exhibits no distension. There is no tenderness. There is no rebound.  Musculoskeletal: Normal range of motion. He exhibits no edema.  Lymphadenopathy:    He has no cervical adenopathy.  Neurological: He is oriented to person, place, and time. He exhibits normal muscle tone. Coordination normal.  Skin: No rash noted. No erythema.  Psychiatric: He has a normal mood and affect. His behavior is normal.     ED Treatments / Results  Labs (all labs ordered are listed, but only abnormal results are displayed) Labs Reviewed  COMPREHENSIVE METABOLIC PANEL - Abnormal; Notable for the following components:      Result Value   Glucose, Bld 138 (*)    BUN 48 (*)    Creatinine, Ser 1.70 (*)    GFR calc non Af Amer 35 (*)    GFR calc Af Amer 41 (*)    All other components within normal limits  CBC WITH DIFFERENTIAL/PLATELET - Abnormal; Notable for the following components:   Hemoglobin 11.5 (*)    HCT 37.1 (*)    MCH 25.3 (*)    All other components within normal limits  TROPONIN I  TROPONIN I    EKG  EKG Interpretation  Date/Time:  Saturday September 24 2017 17:52:38 EDT Ventricular Rate:  67 PR Interval:  188 QRS Duration: 174 QT Interval:  440 QTC Calculation: 464 R Axis:   -58 Text Interpretation:  Atrial-sensed ventricular-paced rhythm Abnormal ECG no change from january Confirmed by Merrily Pew [phone]) on 09/24/2017  6:12:47 PM       Radiology Dg Chest 2 View  Result Date: 09/24/2017 CLINICAL DATA:  Palpitations EXAM: CHEST - 2 VIEW COMPARISON:  07/14/2017 chest radiograph. FINDINGS: Stable configuration of 2 lead left subclavian pacemaker. Stable cardiomediastinal silhouette with top-normal heart size. No pneumothorax. No pleural effusion. Stable posterior right lung base granuloma. No pulmonary edema. No acute consolidative airspace disease. IMPRESSION: No active cardiopulmonary disease. Electronically Signed   By: Ilona Sorrel M.D.   On: 09/24/2017 19:14    Procedures Procedures (including critical care time)  Medications Ordered in ED Medications - No data to display   Initial Impression / Assessment and Plan / ED Course  I have  reviewed the triage vital signs and the nursing notes.  Pertinent labs & imaging results that were available during my care of the patient were reviewed by me and considered in my medical decision making (see chart for details). Labs and x-rays reviewed.  Chemistry shows mild renal insufficiency CBC shows mild anemia.  Patient had 2 term troponins are normal.  Patient also had his pacemaker interrogated and it did not show any type of arrhythmias.  Patient's palpitations do not look like it was related to his heart he is back to normal now he will follow-up with his PCP     Final Clinical Impressions(s) / ED Diagnoses   Final diagnoses:  Palpitations    ED Discharge Orders    None       Milton Ferguson, MD 09/24/17 2134

## 2017-10-06 DIAGNOSIS — E7849 Other hyperlipidemia: Secondary | ICD-10-CM | POA: Diagnosis not present

## 2017-10-06 DIAGNOSIS — E1165 Type 2 diabetes mellitus with hyperglycemia: Secondary | ICD-10-CM | POA: Diagnosis not present

## 2017-10-06 DIAGNOSIS — I10 Essential (primary) hypertension: Secondary | ICD-10-CM | POA: Diagnosis not present

## 2017-10-18 DIAGNOSIS — B351 Tinea unguium: Secondary | ICD-10-CM | POA: Diagnosis not present

## 2017-10-18 DIAGNOSIS — E1142 Type 2 diabetes mellitus with diabetic polyneuropathy: Secondary | ICD-10-CM | POA: Diagnosis not present

## 2017-10-19 ENCOUNTER — Ambulatory Visit (INDEPENDENT_AMBULATORY_CARE_PROVIDER_SITE_OTHER): Payer: Medicare Other | Admitting: Internal Medicine

## 2017-10-19 ENCOUNTER — Encounter: Payer: Self-pay | Admitting: Internal Medicine

## 2017-10-19 VITALS — BP 142/76 | HR 70 | Ht 72.0 in | Wt 206.0 lb

## 2017-10-19 DIAGNOSIS — I442 Atrioventricular block, complete: Secondary | ICD-10-CM

## 2017-10-19 NOTE — Patient Instructions (Signed)
Medication Instructions:  Your physician recommends that you continue on your current medications as directed. Please refer to the Current Medication list given to you today.   Labwork: NONE   Testing/Procedures: NONE   Follow-Up: Your physician wants you to follow-up in: 1 Year with Dr. Taylor. You will receive a reminder letter in the mail two months in advance. If you don't receive a letter, please call our office to schedule the follow-up appointment.   Any Other Special Instructions Will Be Listed Below (If Applicable).     If you need a refill on your cardiac medications before your next appointment, please call your pharmacy.  Thank you for choosing [facility]!   

## 2017-10-19 NOTE — Progress Notes (Signed)
HPI [name] returns today for followup. He is a pleasant 84 yo man with a h/o HTN, symptomatic bradycardia, s/p PPM insertion. In the interim he has done well. He denies chest pain, sob, or syncope. No edema. He admits to a sedentary lifestyle. He has not been in the hospital. He has minimal edema.  Allergies  Allergen Reactions  . Other     "some kind of fluid pill I can't take"      Current Outpatient Medications  Medication Sig Dispense Refill  . aspirin 81 MG tablet Take 81 mg by mouth every evening.     . docusate sodium (COLACE) 100 MG capsule Take 100 mg by mouth daily as needed for mild constipation.    . fenofibrate 160 MG tablet Take 160 mg by mouth every evening.     Marland Kitchen glipiZIDE (GLUCOTROL) 10 MG tablet Take 10 mg by mouth 2 (two) times daily.     Marland Kitchen glucose blood test strip 1 each by Other route 2 (two) times daily. Use as instructed 2 x daily. E11.65 EZ MAX 100 each 5  . linagliptin (TRADJENTA) 5 MG TABS tablet Take 5 mg by mouth daily.    Marland Kitchen losartan (COZAAR) 100 MG tablet Take 100 mg by mouth daily.     . simvastatin (ZOCOR) 20 MG tablet Take 20 mg by mouth every evening.     No current facility-administered medications for this visit.      Past Medical History:  Diagnosis Date  . CKD (chronic kidney disease), stage I   . Diabetes mellitus   . First degree AV block   . Hyperlipidemia   . Hypertension   . Second degree AV block   . Sinus bradycardia     ROS:   All systems reviewed and negative except as noted in the HPI.   Past Surgical History:  Procedure Laterality Date  . COLONOSCOPY    . colonscopy    . HEMORRHOID SURGERY    . PACEMAKER INSERTION  2010   [facility]  . UMBILICAL HERNIA REPAIR  04/2008     Family History  Problem Relation Age of Onset  . Heart disease Father   . Cancer Sister   . Cancer Sister   . Cancer Sister   . ALS Sister      Social History   Socioeconomic History  . Marital status: Widowed    Spouse name:  Not on file  . Number of children: Not on file  . Years of education: Not on file  . Highest education level: Not on file  Occupational History  . 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 Financial resource strain: Not on file  . Food insecurity:    Worry: Not on file    Inability: Not on file  . Transportation needs:    Medical: Not on file    Non-medical: Not on file  Tobacco Use  . Smoking status: Never Smoker  . Smokeless tobacco: Never Used  Substance and Sexual Activity  . Alcohol use: No    Alcohol/week: 0.0 oz  . Drug use: No  . Sexual activity: Never    Birth control/protection: Abstinence  Lifestyle  . Physical activity:    Days per week: Not on file    Minutes per session: Not on file  . Stress: Not on file  Relationships  . Social connections:    Talks on phone: Not on file    Gets together: Not  on file    Attends religious service: Not on file    Active member of club or organization: Not on file    Attends meetings of clubs or organizations: Not on file    Relationship status: Not on file  . Intimate partner violence:    Fear of current or ex partner: Not on file    Emotionally abused: Not on file    Physically abused: Not on file    Forced sexual activity: Not on file  Other Topics Concern  . Not on file  Social History Narrative   Widowed   1 child   Walks daily     BP (!) 142/76 (BP Location: Right Arm)   Pulse 70   Ht 6' (1.829 m)   Wt 206 lb (93.4 kg)   SpO2 90%   BMI 27.94 kg/m   Physical Exam:  Well appearing 84 year old man, NAD HEENT: Unremarkable Neck: 7 cm JVD, no thyromegally Lymphatics:  No adenopathy Back:  No CVA tenderness Lungs:  Clear, with no wheezes, rales, or rhonchi HEART:  Regular rate rhythm, no murmurs, no rubs, no clicks Abd:  soft, positive bowel sounds, no organomegally, no rebound, no guarding Ext:  2 plus pulses, 1+ peripheral edema, no cyanosis, no clubbing Skin:  No rashes no  nodules Neuro:  CN II through XII intact, motor grossly intact  DEVICE  Normal device function.  See PaceArt for details.   Assess/Plan: 1.  Complete heart block -he is asymptomatic status post pacemaker insertion 2.  Pacemaker -is [place] dual-chamber pacemaker is working normally and has almost 3 years of battery longevity. 3.  Hypertension -his blood pressure is up a bit in the office today.  At home it is better.  He is encouraged to reduce his salt intake. 4.  Dyslipidemia - he will continue his medical therapy with simvastatin.   Crissie Sickles, MD

## 2017-10-20 ENCOUNTER — Ambulatory Visit: Payer: Self-pay | Admitting: "Endocrinology

## 2017-10-20 DIAGNOSIS — E1122 Type 2 diabetes mellitus with diabetic chronic kidney disease: Secondary | ICD-10-CM | POA: Diagnosis not present

## 2017-10-20 DIAGNOSIS — N183 Chronic kidney disease, stage 3 (moderate): Secondary | ICD-10-CM | POA: Diagnosis not present

## 2017-10-20 DIAGNOSIS — E1165 Type 2 diabetes mellitus with hyperglycemia: Secondary | ICD-10-CM | POA: Diagnosis not present

## 2017-10-21 LAB — COMPLETE METABOLIC PANEL WITH GFR
AG Ratio: 1.8 (calc) (ref 1.0–2.5)
ALT: 23 U/L (ref 9–46)
AST: 32 U/L (ref 10–35)
Albumin: 4.1 g/dL (ref 3.6–5.1)
Alkaline phosphatase (APISO): 40 U/L (ref 40–115)
BUN/Creatinine Ratio: 25 (calc) — ABNORMAL HIGH (ref 6–22)
BUN: 35 mg/dL — ABNORMAL HIGH (ref 7–25)
CO2: 29 mmol/L (ref 20–32)
Calcium: 9.5 mg/dL (ref 8.6–10.3)
Chloride: 107 mmol/L (ref 98–110)
Creat: 1.41 mg/dL — ABNORMAL HIGH (ref 0.70–1.11)
GFR, Est African American: 52 mL/min/{1.73_m2} — ABNORMAL LOW (ref >=60)
GFR, Est Non African American: 45 mL/min/{1.73_m2} — ABNORMAL LOW (ref >=60)
Globulin: 2.3 g/dL (calc) (ref 1.9–3.7)
Glucose, Bld: 97 mg/dL (ref 65–139)
Potassium: 4.8 mmol/L (ref 3.5–5.3)
Sodium: 140 mmol/L (ref 135–146)
Total Bilirubin: 0.6 mg/dL (ref 0.2–1.2)
Total Protein: 6.4 g/dL (ref 6.1–8.1)

## 2017-10-21 LAB — HEMOGLOBIN A1C
Hgb A1c MFr Bld: 7.5 % of total Hgb — ABNORMAL HIGH (ref <5.7)
Mean Plasma Glucose: 169 (calc)
eAG (mmol/L): 9.3 (calc)

## 2017-11-03 ENCOUNTER — Encounter: Payer: Self-pay | Admitting: "Endocrinology

## 2017-11-03 ENCOUNTER — Ambulatory Visit (INDEPENDENT_AMBULATORY_CARE_PROVIDER_SITE_OTHER): Payer: Medicare Other | Admitting: "Endocrinology

## 2017-11-03 VITALS — BP 133/72 | HR 85 | Ht 72.0 in | Wt 210.0 lb

## 2017-11-03 DIAGNOSIS — E1122 Type 2 diabetes mellitus with diabetic chronic kidney disease: Secondary | ICD-10-CM

## 2017-11-03 DIAGNOSIS — N183 Chronic kidney disease, stage 3 (moderate): Secondary | ICD-10-CM | POA: Diagnosis not present

## 2017-11-03 DIAGNOSIS — I10 Essential (primary) hypertension: Secondary | ICD-10-CM

## 2017-11-03 DIAGNOSIS — E1165 Type 2 diabetes mellitus with hyperglycemia: Secondary | ICD-10-CM | POA: Diagnosis not present

## 2017-11-03 DIAGNOSIS — E782 Mixed hyperlipidemia: Secondary | ICD-10-CM

## 2017-11-03 DIAGNOSIS — IMO0002 Reserved for concepts with insufficient information to code with codable children: Secondary | ICD-10-CM

## 2017-11-03 NOTE — Progress Notes (Signed)
Consult Note       11/03/2017, 4:55 PM   Subjective:    Patient ID: Kyle Schultz, male    DOB: [birth year].  Kyle Schultz is being seen in consultation for management of currently uncontrolled symptomatic diabetes requested by  Rosita Fire, MD.   Past Medical History:  Diagnosis Date  . CKD (chronic kidney disease), stage I   . Diabetes mellitus   . First degree AV block   . Hyperlipidemia   . Hypertension   . Second degree AV block   . Sinus bradycardia    Past Surgical History:  Procedure Laterality Date  . COLONOSCOPY    . colonscopy    . HEMORRHOID SURGERY    . PACEMAKER INSERTION  2010   [facility]  . UMBILICAL HERNIA REPAIR  04/2008   Social History   Socioeconomic History  . Marital status: Widowed    Spouse name: Not on file  . Number of children: Not on file  . Years of education: Not on file  . Highest education level: Not on file  Occupational History  . 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 Financial resource strain: Not on file  . Food insecurity:    Worry: Not on file    Inability: Not on file  . Transportation needs:    Medical: Not on file    Non-medical: Not on file  Tobacco Use  . Smoking status: Never Smoker  . Smokeless tobacco: Never Used  Substance and Sexual Activity  . Alcohol use: No    Alcohol/week: 0.0 oz  . Drug use: No  . Sexual activity: Never    Birth control/protection: Abstinence  Lifestyle  . Physical activity:    Days per week: Not on file    Minutes per session: Not on file  . Stress: Not on file  Relationships  . Social connections:    Talks on phone: Not on file    Gets together: Not on file    Attends religious service: Not on file    Active member of club or organization: Not on file    Attends meetings of clubs or organizations: Not on file    Relationship status: Not on file  Other Topics Concern  . Not on  file  Social History Narrative   Widowed   1 child   Walks daily   Outpatient Encounter Medications as of 11/03/2017  Medication Sig  . aspirin 81 MG tablet Take 81 mg by mouth every evening.   . docusate sodium (COLACE) 100 MG capsule Take 100 mg by mouth daily as needed for mild constipation.  . fenofibrate 160 MG tablet Take 160 mg by mouth every evening.   Marland Kitchen glipiZIDE (GLUCOTROL) 10 MG tablet Take 10 mg by mouth 2 (two) times daily.   Marland Kitchen glucose blood test strip 1 each by Other route 2 (two) times daily. Use as instructed 2 x daily. E11.65 EZ MAX  . linagliptin (TRADJENTA) 5 MG TABS tablet Take 5 mg by mouth daily.  Marland Kitchen losartan (COZAAR) 100 MG tablet Take 100 mg by mouth daily.   . simvastatin (  ZOCOR) 20 MG tablet Take 20 mg by mouth every evening.   No facility-administered encounter medications on file as of 11/03/2017.     ALLERGIES: Allergies  Allergen Reactions  . Other     "some kind of fluid pill I can't take"     VACCINATION STATUS: Immunization History  Administered Date(s) Administered  . Influenza, High Dose Seasonal PF 04/01/2017    Diabetes  He presents for his follow-up diabetic visit. He has type 2 diabetes mellitus. Onset time: Patient is not sure when he was diagnosed with diabetes, at approximate age of 24 years. His disease course has been improving. There are no hypoglycemic associated symptoms. Pertinent negatives for hypoglycemia include no confusion, headaches, pallor or seizures. Associated symptoms include blurred vision. Pertinent negatives for diabetes include no chest pain, no fatigue, no polydipsia, no polyphagia, no polyuria and no weakness. There are no hypoglycemic complications. Symptoms are improving. Diabetic complications include nephropathy. Risk factors for coronary artery disease include dyslipidemia, diabetes mellitus, hypertension and male sex. Current diabetic treatments: He is on glipizide 10 mg p.o. twice daily, Tradjenta 5 mg p.o. once a  day. His weight is fluctuating minimally. He is following a generally unhealthy diet. When asked about meal planning, he reported none. He has not had a previous visit with a dietitian. He never participates in exercise. His overall blood glucose range is 140-180 mg/dl. (Brought a log book showing significantly improving glycemic profile and his A1c has improved to 7.5%.  ) An ACE inhibitor/angiotensin II receptor blocker is being taken. He does not see a podiatrist.Eye exam is current.  Hyperlipidemia  This is a chronic problem. The problem is controlled. Exacerbating diseases include diabetes. Pertinent negatives include no chest pain, myalgias or shortness of breath. Current antihyperlipidemic treatment includes statins. Risk factors for coronary artery disease include dyslipidemia, diabetes mellitus, hypertension, male sex and a sedentary lifestyle.  Hypertension  This is a chronic problem. The current episode started more than 1 year ago. The problem is uncontrolled. Associated symptoms include blurred vision. Pertinent negatives include no chest pain, headaches, neck pain, palpitations or shortness of breath. Past treatments include angiotensin blockers. Hypertensive end-organ damage includes kidney disease.    Review of Systems  Constitutional: Negative for chills, fatigue, fever and unexpected weight change.  HENT: Negative for dental problem, mouth sores and trouble swallowing.   Eyes: Positive for blurred vision. Negative for visual disturbance.  Respiratory: Negative for cough, choking, chest tightness, shortness of breath and wheezing.   Cardiovascular: Negative for chest pain, palpitations and leg swelling.  Gastrointestinal: Negative for abdominal distention, abdominal pain, constipation, diarrhea, nausea and vomiting.  Endocrine: Negative for polydipsia, polyphagia and polyuria.  Genitourinary: Negative for dysuria, flank pain, hematuria and urgency.  Musculoskeletal: Negative for  back pain, gait problem, myalgias and neck pain.  Skin: Negative for pallor, rash and wound.  Neurological: Negative for seizures, syncope, weakness, numbness and headaches.  Psychiatric/Behavioral: Negative for confusion and dysphoric mood.    Objective:    BP 133/72   Pulse 85   Ht 6' (1.829 m)   Wt 210 lb (95.3 kg)   BMI 28.48 kg/m   Wt Readings from Last 3 Encounters:  11/03/17 210 lb (95.3 kg)  10/19/17 206 lb (93.4 kg)  09/24/17 210 lb (95.3 kg)     Physical Exam  Constitutional: He is oriented to person, place, and time. He appears well-developed. He is cooperative. No distress.  HENT:  Head: Normocephalic and atraumatic.  Eyes: EOM are normal.  Neck: Normal range of motion. Neck supple. No tracheal deviation present. No thyromegaly present.  Cardiovascular: Normal rate, S1 normal and S2 normal. Exam reveals no gallop.  No murmur heard. Pulses:      Dorsalis pedis pulses are 1+ on the right side, and 1+ on the left side.       Posterior tibial pulses are 1+ on the right side, and 1+ on the left side.  Pulmonary/Chest: Effort normal. No respiratory distress. He has no wheezes.  Abdominal: He exhibits no distension. There is no tenderness. There is no guarding and no CVA tenderness.  Musculoskeletal: He exhibits no edema.       Right shoulder: He exhibits no swelling and no deformity.  Neurological: He is alert and oriented to person, place, and time. He has normal strength and normal reflexes. No cranial nerve deficit or sensory deficit. Gait normal.  Patient is significantly hard of hearing.  Skin: Skin is warm and dry. No rash noted. No cyanosis. Nails show no clubbing.  Psychiatric: He has a normal mood and affect. His speech is normal. Cognition and memory are normal.     CMP ( most recent) CMP     Component Value Date/Time   NA 140 10/20/2017 0903   K 4.8 10/20/2017 0903   CL 107 10/20/2017 0903   CO2 29 10/20/2017 0903   GLUCOSE 97 10/20/2017 0903   BUN  35 (H) 10/20/2017 0903   CREATININE 1.41 (H) 10/20/2017 0903   CALCIUM 9.5 10/20/2017 0903   PROT 6.4 10/20/2017 0903   ALBUMIN 4.2 09/24/2017 1811   AST 32 10/20/2017 0903   ALT 23 10/20/2017 0903   ALKPHOS 43 09/24/2017 1811   BILITOT 0.6 10/20/2017 0903   GFRNONAA 45 (L) 10/20/2017 0903   GFRAA 52 (L) 10/20/2017 0903     Diabetic Labs (most recent): Lab Results  Component Value Date   HGBA1C 7.5 (H) 10/20/2017   HGBA1C 10.5 07/07/2017   HGBA1C 8.0 (H) 05/31/2016     Lipid Panel ( most recent) Lipid Panel     Component Value Date/Time   CHOL 130 07/07/2017   TRIG 101 07/07/2017   HDL 55 07/07/2017   CHOLHDL 4.3 Ratio 08/01/2009 1829   VLDL 33 08/01/2009 1829   LDLCALC 57 07/07/2017       Assessment & Plan:   1. Uncontrolled type 2 diabetes mellitus with stage 3 chronic kidney disease ([place]), conduction abnormality of the heart - Kyle Schultz has currently uncontrolled symptomatic type 2 DM since approximately 84 years of age. -He returns with improving glycemic profile, A1c of 7.5% improving from 10.5%. - Recent labs reviewed, showing stage 3-4 renal insufficiency.  -his diabetes is complicated by stage 3-4 renal insufficiency, lack of adequate social support and GLEB MCGUIRE remains at a high risk for more acute and chronic complications which include CAD, CVA, CKD, retinopathy, and neuropathy. These are all discussed in detail with the patient.  - I have counseled him on diet management  by adopting a carbohydrate restricted/protein rich diet.  -  Suggestion is made for him to avoid simple carbohydrates  from his diet including Cakes, Sweet Desserts / Pastries, Ice Cream, Soda (diet and regular), Sweet Tea, Candies, Chips, Cookies, Store Bought Juices, Alcohol in Excess of  1-2 drinks a day, Artificial Sweeteners, and "Sugar-free" Products. This will help patient to have stable blood glucose profile and potentially avoid unintended weight gain.   - I encouraged  him to switch to  unprocessed  or minimally processed complex starch and increased protein intake (animal or plant source), fruits, and vegetables.  - he is advised to stick to a routine mealtimes to eat 3 meals  a day and avoid unnecessary snacks ( to snack only to correct hypoglycemia).   - I have approached him with the following individualized plan to manage diabetes and patient agrees:   -He has responded to glipizide and metformin therapy.   -Given the fact that he is at particularly high risk for hypoglycemia from inadvertent/improper use of insulin, he is not a suitable candidate to give insulin to. -He does not have adequate support at home.  -I will continue with non-insulin treatment for him .  -Advised him to continue glipizide 10 mg p.o. twice daily with breakfast and supper and continue Tradjenta 5 mg p.o. daily with breakfast.  -He agrees to continue monitoring blood glucose at least twice a day-daily before breakfast and before he is going to bed .  -Patient is encouraged to call clinic for blood glucose levels less than 70 or above 300 mg /dl.  -Patient is not a candidate for SGLT2 inhibitors due to CKD.  - Patient specific target  A1c;  LDL, HDL, Triglycerides, and  Waist Circumference were discussed in detail.  2) BP/HTN: His blood pressure is controlled to target.   I have advised him to continue his current blood pressure medications including ARB. 3) Lipids/HPL: His lipid panel from December is favorable with LDL of 57.  He may not benefit from the statins any longer.  He is advised to finish simvastatin supplies he has and discontinue after that.    4)  Weight/Diet: CDE Consult will be initiated , exercise, and detailed carbohydrates information provided.  5) Chronic Care/Health Maintenance:  -he  is on ACEI/ARB and Statin medications and  is encouraged to continue to follow up with Ophthalmology, Dentist,  Podiatrist at least yearly or according to recommendations,  and advised to stay away from smoking. I have recommended yearly flu vaccine and pneumonia vaccination at least every 5 years; moderate intensity exercise for up to 150 minutes weekly; and  sleep for at least 7 hours a day.  - I advised patient to maintain close follow up with Rosita Fire, MD for primary care needs.  - Time spent with the patient: 25 min, of which >50% was spent in reviewing his blood glucose logs , discussing his hypo- and hyper-glycemic episodes, reviewing his current and  previous labs and insulin doses and developing a plan to avoid hypo- and hyper-glycemia. Please refer to Patient Instructions for Blood Glucose Monitoring and Insulin/Medications Dosing Guide"  in media tab for additional information. Kyle Schultz participated in the discussions, expressed understanding, and voiced agreement with the above plans.  All questions were answered to his satisfaction. he is encouraged to contact clinic should he have any questions or concerns prior to his return visit.   Follow up plan: - Return in about 4 months (around 03/05/2018) for follow up with pre-visit labs, meter, and logs.  Glade Lloyd, MD [facility] [address] [place], [place] [address] Phone: [phone]  Fax: [phone]    11/03/2017, 4:55 PM  This note was partially dictated with voice recognition software. Similar sounding words can be transcribed inadequately or may not  be corrected upon review.

## 2017-11-07 DIAGNOSIS — Z0000 Encounter for general adult medical examination without abnormal findings: Secondary | ICD-10-CM | POA: Diagnosis not present

## 2017-11-07 DIAGNOSIS — I442 Atrioventricular block, complete: Secondary | ICD-10-CM | POA: Diagnosis not present

## 2017-11-07 DIAGNOSIS — N183 Chronic kidney disease, stage 3 (moderate): Secondary | ICD-10-CM | POA: Diagnosis not present

## 2017-11-07 DIAGNOSIS — E119 Type 2 diabetes mellitus without complications: Secondary | ICD-10-CM | POA: Diagnosis not present

## 2017-11-07 DIAGNOSIS — Z950 Presence of cardiac pacemaker: Secondary | ICD-10-CM | POA: Diagnosis not present

## 2017-11-07 DIAGNOSIS — R7309 Other abnormal glucose: Secondary | ICD-10-CM | POA: Diagnosis not present

## 2017-11-07 DIAGNOSIS — Z1331 Encounter for screening for depression: Secondary | ICD-10-CM | POA: Diagnosis not present

## 2017-11-07 DIAGNOSIS — Z1389 Encounter for screening for other disorder: Secondary | ICD-10-CM | POA: Diagnosis not present

## 2017-11-07 DIAGNOSIS — I10 Essential (primary) hypertension: Secondary | ICD-10-CM | POA: Diagnosis not present

## 2017-11-07 DIAGNOSIS — E1165 Type 2 diabetes mellitus with hyperglycemia: Secondary | ICD-10-CM | POA: Diagnosis not present

## 2017-11-11 DIAGNOSIS — I10 Essential (primary) hypertension: Secondary | ICD-10-CM | POA: Diagnosis not present

## 2017-11-11 DIAGNOSIS — E1121 Type 2 diabetes mellitus with diabetic nephropathy: Secondary | ICD-10-CM | POA: Diagnosis not present

## 2017-11-11 DIAGNOSIS — N183 Chronic kidney disease, stage 3 (moderate): Secondary | ICD-10-CM | POA: Diagnosis not present

## 2017-11-11 DIAGNOSIS — R809 Proteinuria, unspecified: Secondary | ICD-10-CM | POA: Diagnosis not present

## 2017-11-14 DIAGNOSIS — Z79899 Other long term (current) drug therapy: Secondary | ICD-10-CM | POA: Diagnosis not present

## 2017-11-14 DIAGNOSIS — I10 Essential (primary) hypertension: Secondary | ICD-10-CM | POA: Diagnosis not present

## 2017-11-14 DIAGNOSIS — N183 Chronic kidney disease, stage 3 (moderate): Secondary | ICD-10-CM | POA: Diagnosis not present

## 2017-11-14 DIAGNOSIS — R809 Proteinuria, unspecified: Secondary | ICD-10-CM | POA: Diagnosis not present

## 2017-11-14 DIAGNOSIS — D509 Iron deficiency anemia, unspecified: Secondary | ICD-10-CM | POA: Diagnosis not present

## 2017-11-14 DIAGNOSIS — Z1159 Encounter for screening for other viral diseases: Secondary | ICD-10-CM | POA: Diagnosis not present

## 2017-11-14 DIAGNOSIS — E559 Vitamin D deficiency, unspecified: Secondary | ICD-10-CM | POA: Diagnosis not present

## 2017-11-21 ENCOUNTER — Other Ambulatory Visit (HOSPITAL_COMMUNITY): Payer: Self-pay | Admitting: Medical

## 2017-11-21 DIAGNOSIS — N183 Chronic kidney disease, stage 3 unspecified: Secondary | ICD-10-CM

## 2017-11-25 ENCOUNTER — Ambulatory Visit (HOSPITAL_COMMUNITY): Payer: Medicare Other

## 2017-12-02 ENCOUNTER — Ambulatory Visit (HOSPITAL_COMMUNITY)
Admission: RE | Admit: 2017-12-02 | Discharge: 2017-12-02 | Disposition: A | Discharge status: Home / Self Care | Payer: Medicare Other | Source: Ambulatory Visit | Attending: Medical | Admitting: Medical

## 2017-12-02 DIAGNOSIS — N183 Chronic kidney disease, stage 3 unspecified: Secondary | ICD-10-CM

## 2017-12-02 DIAGNOSIS — I129 Hypertensive chronic kidney disease with stage 1 through stage 4 chronic kidney disease, or unspecified chronic kidney disease: Secondary | ICD-10-CM | POA: Diagnosis not present

## 2017-12-02 DIAGNOSIS — N281 Cyst of kidney, acquired: Secondary | ICD-10-CM | POA: Diagnosis not present

## 2017-12-30 DIAGNOSIS — E1142 Type 2 diabetes mellitus with diabetic polyneuropathy: Secondary | ICD-10-CM | POA: Diagnosis not present

## 2017-12-30 DIAGNOSIS — B351 Tinea unguium: Secondary | ICD-10-CM | POA: Diagnosis not present

## 2018-01-03 DIAGNOSIS — R809 Proteinuria, unspecified: Secondary | ICD-10-CM | POA: Diagnosis not present

## 2018-01-03 DIAGNOSIS — E1129 Type 2 diabetes mellitus with other diabetic kidney complication: Secondary | ICD-10-CM | POA: Diagnosis not present

## 2018-01-03 DIAGNOSIS — I5030 Unspecified diastolic (congestive) heart failure: Secondary | ICD-10-CM | POA: Diagnosis not present

## 2018-01-03 DIAGNOSIS — I10 Essential (primary) hypertension: Secondary | ICD-10-CM | POA: Diagnosis not present

## 2018-01-03 DIAGNOSIS — N183 Chronic kidney disease, stage 3 (moderate): Secondary | ICD-10-CM | POA: Diagnosis not present

## 2018-02-06 DIAGNOSIS — E1165 Type 2 diabetes mellitus with hyperglycemia: Secondary | ICD-10-CM | POA: Diagnosis not present

## 2018-02-06 DIAGNOSIS — E160 Drug-induced hypoglycemia without coma: Secondary | ICD-10-CM | POA: Diagnosis not present

## 2018-02-06 DIAGNOSIS — Z950 Presence of cardiac pacemaker: Secondary | ICD-10-CM | POA: Diagnosis not present

## 2018-02-06 DIAGNOSIS — N183 Chronic kidney disease, stage 3 (moderate): Secondary | ICD-10-CM | POA: Diagnosis not present

## 2018-02-06 DIAGNOSIS — I10 Essential (primary) hypertension: Secondary | ICD-10-CM | POA: Diagnosis not present

## 2018-03-08 ENCOUNTER — Encounter: Payer: Self-pay | Admitting: "Endocrinology

## 2018-03-08 ENCOUNTER — Ambulatory Visit (INDEPENDENT_AMBULATORY_CARE_PROVIDER_SITE_OTHER): Payer: Medicare Other | Admitting: "Endocrinology

## 2018-03-08 VITALS — BP 120/77 | HR 93 | Ht 72.0 in | Wt 213.0 lb

## 2018-03-08 DIAGNOSIS — I10 Essential (primary) hypertension: Secondary | ICD-10-CM | POA: Diagnosis not present

## 2018-03-08 DIAGNOSIS — E1122 Type 2 diabetes mellitus with diabetic chronic kidney disease: Secondary | ICD-10-CM | POA: Diagnosis not present

## 2018-03-08 DIAGNOSIS — IMO0002 Reserved for concepts with insufficient information to code with codable children: Secondary | ICD-10-CM

## 2018-03-08 DIAGNOSIS — E1165 Type 2 diabetes mellitus with hyperglycemia: Secondary | ICD-10-CM | POA: Diagnosis not present

## 2018-03-08 DIAGNOSIS — E782 Mixed hyperlipidemia: Secondary | ICD-10-CM

## 2018-03-08 DIAGNOSIS — N183 Chronic kidney disease, stage 3 (moderate): Secondary | ICD-10-CM

## 2018-03-08 NOTE — Progress Notes (Signed)
Consult follow-up note       03/08/2018, 3:08 PM   Subjective:    Patient ID: Kyle Schultz, male    DOB: 1929-12-18.  Kyle Schultz is being seen in follow-up  for management of currently uncontrolled symptomatic uncontrolled type 2 diabetes complicated by renal insufficiency, hypertension, hyperlipidemia. PMD:   Rosita Fire, MD.   Past Medical History:  Diagnosis Date  . CKD (chronic kidney disease), stage I   . Diabetes mellitus   . First degree AV block   . Hyperlipidemia   . Hypertension   . Second degree AV block   . Sinus bradycardia    Past Surgical History:  Procedure Laterality Date  . COLONOSCOPY    . colonscopy    . HEMORRHOID SURGERY    . PACEMAKER INSERTION  2010   [facility]  . UMBILICAL HERNIA REPAIR  04/2008   Social History   Socioeconomic History  . Marital status: Widowed    Spouse name: Not on file  . Number of children: Not on file  . Years of education: Not on file  . Highest education level: Not on file  Occupational History  . 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 Financial resource strain: Not on file  . Food insecurity:    Worry: Not on file    Inability: Not on file  . Transportation needs:    Medical: Not on file    Non-medical: Not on file  Tobacco Use  . Smoking status: Never Smoker  . Smokeless tobacco: Never Used  Substance and Sexual Activity  . Alcohol use: No    Alcohol/week: 0.0 standard drinks  . Drug use: No  . Sexual activity: Never    Birth control/protection: Abstinence  Lifestyle  . Physical activity:    Days per week: Not on file    Minutes per session: Not on file  . Stress: Not on file  Relationships  . Social connections:    Talks on phone: Not on file    Gets together: Not on file    Attends religious service: Not on file    Active member of club or organization: Not on file    Attends meetings of clubs or  organizations: Not on file    Relationship status: Not on file  Other Topics Concern  . Not on file  Social History Narrative   Widowed   1 child   Walks daily   Outpatient Encounter Medications as of 03/08/2018  Medication Sig  . aspirin 81 MG tablet Take 81 mg by mouth every evening.   . fenofibrate 160 MG tablet Take 160 mg by mouth every evening.   . furosemide (LASIX) 40 MG tablet daily.  Marland Kitchen glipiZIDE (GLUCOTROL) 10 MG tablet Take 10 mg by mouth 2 (two) times daily.   Marland Kitchen linagliptin (TRADJENTA) 5 MG TABS tablet Take 5 mg by mouth daily.  Marland Kitchen losartan (COZAAR) 100 MG tablet Take 100 mg by mouth daily.   . simvastatin (ZOCOR) 20 MG tablet daily.  Marland Kitchen docusate sodium (COLACE) 100 MG capsule Take 100 mg by mouth daily as needed for mild  constipation.  Marland Kitchen glucose blood test strip 1 each by Other route 2 (two) times daily. Use as instructed 2 x daily. E11.65 EZ MAX   No facility-administered encounter medications on file as of 03/08/2018.     ALLERGIES: Allergies  Allergen Reactions  . Other     "some kind of fluid pill I can't take"     VACCINATION STATUS: Immunization History  Administered Date(s) Administered  . Influenza, High Dose Seasonal PF 04/01/2017    Diabetes  He presents for his follow-up diabetic visit. He has type 2 diabetes mellitus. Onset time: Patient is not sure when he was diagnosed with diabetes, at approximate age of 22 years. His disease course has been improving. There are no hypoglycemic associated symptoms. Pertinent negatives for hypoglycemia include no confusion, headaches, pallor or seizures. Associated symptoms include blurred vision. Pertinent negatives for diabetes include no chest pain, no fatigue, no polydipsia, no polyphagia, no polyuria and no weakness. There are no hypoglycemic complications. Symptoms are stable. Diabetic complications include nephropathy. Risk factors for coronary artery disease include dyslipidemia, diabetes mellitus, hypertension and  male sex. Current diabetic treatments: He is on glipizide 10 mg p.o. twice daily, Tradjenta 5 mg p.o. once a day. His weight is fluctuating minimally. He is following a generally unhealthy diet. When asked about meal planning, he reported none. He has not had a previous visit with a dietitian. He never participates in exercise. His breakfast blood glucose range is generally 130-140 mg/dl. His bedtime blood glucose range is generally 130-140 mg/dl. His overall blood glucose range is 130-140 mg/dl. (Brought a log book showing significantly improving glycemic profile to near target levels.  He did not do his previsit labs.  A1c during his last visit was 7.5%.    ) An ACE inhibitor/angiotensin II receptor blocker is being taken. He does not see a podiatrist.Eye exam is current.  Hyperlipidemia  This is a chronic problem. The problem is controlled. Exacerbating diseases include diabetes. Pertinent negatives include no chest pain, myalgias or shortness of breath. Current antihyperlipidemic treatment includes statins. Risk factors for coronary artery disease include dyslipidemia, diabetes mellitus, hypertension, male sex and a sedentary lifestyle.  Hypertension  This is a chronic problem. The current episode started more than 1 year ago. The problem is uncontrolled. Associated symptoms include blurred vision. Pertinent negatives include no chest pain, headaches, neck pain, palpitations or shortness of breath. Past treatments include angiotensin blockers. Hypertensive end-organ damage includes kidney disease.    Review of Systems  Constitutional: Negative for chills, fatigue, fever and unexpected weight change.  HENT: Negative for dental problem, mouth sores and trouble swallowing.   Eyes: Positive for blurred vision. Negative for visual disturbance.  Respiratory: Negative for cough, choking, chest tightness, shortness of breath and wheezing.   Cardiovascular: Negative for chest pain, palpitations and leg  swelling.  Gastrointestinal: Negative for abdominal distention, abdominal pain, constipation, diarrhea, nausea and vomiting.  Endocrine: Negative for polydipsia, polyphagia and polyuria.  Genitourinary: Negative for dysuria, flank pain, hematuria and urgency.  Musculoskeletal: Negative for back pain, gait problem, myalgias and neck pain.  Skin: Negative for pallor, rash and wound.  Neurological: Negative for seizures, syncope, weakness, numbness and headaches.  Psychiatric/Behavioral: Negative for confusion and dysphoric mood.    Objective:    BP 120/77   Pulse 93   Ht 6' (1.829 m)   Wt 213 lb (96.6 kg)   BMI 28.89 kg/m   Wt Readings from Last 3 Encounters:  03/08/18 213 lb (96.6 kg)  11/03/17 210 lb (95.3 kg)  10/19/17 206 lb (93.4 kg)     Physical Exam  Constitutional: He is oriented to person, place, and time. He appears well-developed. He is cooperative. No distress.  HENT:  Head: Normocephalic and atraumatic.  Eyes: EOM are normal.  Neck: Normal range of motion. Neck supple. No tracheal deviation present. No thyromegaly present.  Cardiovascular: Normal rate, S1 normal and S2 normal. Exam reveals no gallop.  No murmur heard. Pulses:      Dorsalis pedis pulses are 1+ on the right side, and 1+ on the left side.       Posterior tibial pulses are 1+ on the right side, and 1+ on the left side.  Pulmonary/Chest: Effort normal. No respiratory distress. He has no wheezes.  Abdominal: He exhibits no distension. There is no tenderness. There is no guarding and no CVA tenderness.  Musculoskeletal: He exhibits no edema.       Right shoulder: He exhibits no swelling and no deformity.  Neurological: He is alert and oriented to person, place, and time. He has normal strength and normal reflexes. No cranial nerve deficit or sensory deficit. Gait normal.  Patient is significantly hard of hearing.  Skin: Skin is warm and dry. No rash noted. No cyanosis. Nails show no clubbing.   Psychiatric: He has a normal mood and affect. His speech is normal. Cognition and memory are normal.     CMP ( most recent) CMP     Component Value Date/Time   NA 140 10/20/2017 0903   K 4.8 10/20/2017 0903   CL 107 10/20/2017 0903   CO2 29 10/20/2017 0903   GLUCOSE 97 10/20/2017 0903   BUN 35 (H) 10/20/2017 0903   CREATININE 1.41 (H) 10/20/2017 0903   CALCIUM 9.5 10/20/2017 0903   PROT 6.4 10/20/2017 0903   ALBUMIN 4.2 09/24/2017 1811   AST 32 10/20/2017 0903   ALT 23 10/20/2017 0903   ALKPHOS 43 09/24/2017 1811   BILITOT 0.6 10/20/2017 0903   GFRNONAA 45 (L) 10/20/2017 0903   GFRAA 52 (L) 10/20/2017 0903     Diabetic Labs (most recent): Lab Results  Component Value Date   HGBA1C 7.5 (H) 10/20/2017   HGBA1C 10.5 07/07/2017   HGBA1C 8.0 (H) 05/31/2016     Lipid Panel ( most recent) Lipid Panel     Component Value Date/Time   CHOL 130 07/07/2017   TRIG 101 07/07/2017   HDL 55 07/07/2017   CHOLHDL 4.3 Ratio 08/01/2009 1829   VLDL 33 08/01/2009 1829   LDLCALC 57 07/07/2017       Assessment & Plan:   1. Uncontrolled type 2 diabetes mellitus with stage 3 chronic kidney disease ([place]), conduction abnormality of the heart - Kyle Schultz has currently uncontrolled symptomatic type 2 DM since approximately 84 years of age. -He returns with improving glycemic profile, prior to his last visit A1c was 7.5% improving from 10.5%.   - Recent labs reviewed, showing stage 3-4 renal insufficiency.  -his diabetes is complicated by stage 3-4 renal insufficiency, lack of adequate social support and Kyle Schultz remains at a high risk for more acute and chronic complications which include CAD, CVA, CKD, retinopathy, and neuropathy. These are all discussed in detail with the patient.  - I have counseled him on diet management  by adopting a carbohydrate restricted/protein rich diet.  -  Suggestion is made for him to avoid simple carbohydrates  from his diet including Cakes,  Sweet Desserts / Pastries,  Ice Cream, Soda (diet and regular), Sweet Tea, Candies, Chips, Cookies, Store Bought Juices, Alcohol in Excess of  1-2 drinks a day, Artificial Sweeteners, and "Sugar-free" Products. This will help patient to have stable blood glucose profile and potentially avoid unintended weight gain.   - I encouraged him to switch to  unprocessed or minimally processed complex starch and increased protein intake (animal or plant source), fruits, and vegetables.  - he is advised to stick to a routine mealtimes to eat 3 meals  a day and avoid unnecessary snacks ( to snack only to correct hypoglycemia).   - I have approached him with the following individualized plan to manage diabetes and patient agrees:     -Given the fact that he is at particularly high risk for hypoglycemia from inadvertent/improper use of insulin, he is not a suitable candidate to give insulin to. -He does not have adequate support at home.  -I will continue with non-insulin treatment for him .  -I advised him to continue glipizide 10 mg p.o. twice daily with breakfast and supper, and Tradjenta 5 mg p.o. daily with breakfast.   -He agrees to continue monitoring blood glucose at least twice a day-daily before breakfast and before he is going to bed .  -Patient is encouraged to call clinic for blood glucose levels less than 70 or above 300 mg /dl.  -Patient is not a candidate for SGLT2 inhibitors due to CKD.  - Patient specific target  A1c;  LDL, HDL, Triglycerides, and  Waist Circumference were discussed in detail.  2) BP/HTN: His blood pressure is controlled to target.  He is advised to continue losartan 100 mg p.o. daily.   3) Lipids/HPL: His lipid panel from December is favorable with LDL of 57.  He may not benefit from the statins any longer.  He is advised to finish simvastatin supplies he has and discontinue after that.    4)  Weight/Diet: CDE Consult will be initiated , exercise, and detailed  carbohydrates information provided.  5) Chronic Care/Health Maintenance:  -he  is on ACEI/ARB and Statin medications and  is encouraged to continue to follow up with Ophthalmology, Dentist,  Podiatrist at least yearly or according to recommendations, and advised to stay away from smoking. I have recommended yearly flu vaccine and pneumonia vaccination at least every 5 years; moderate intensity exercise for up to 150 minutes weekly; and  sleep for at least 7 hours a day.  - I advised patient to maintain close follow up with Rosita Fire, MD for primary care needs.  - Time spent with the patient: 25 min, of which >50% was spent in reviewing his blood glucose logs , discussing his hypo- and hyper-glycemic episodes, reviewing his current and  previous labs and insulin doses and developing a plan to avoid hypo- and hyper-glycemia. Please refer to Patient Instructions for Blood Glucose Monitoring and Insulin/Medications Dosing Guide"  in media tab for additional information. Kyle Schultz participated in the discussions, expressed understanding, and voiced agreement with the above plans.  All questions were answered to his satisfaction. he is encouraged to contact clinic should he have any questions or concerns prior to his return visit.    Follow up plan: - Return in about 4 months (around 07/08/2018) for Labs Today- Non-Fasting Ok, Follow up with Pre-visit Labs, Meter, and Logs.  Glade Lloyd, MD [facility] [address] [place], [place] [address] Phone: [phone]  Fax: [phone]    03/08/2018, 3:08  PM  This note was partially dictated with voice recognition software. Similar sounding words can be transcribed inadequately or may not  be corrected upon review.

## 2018-03-08 NOTE — Patient Instructions (Signed)

## 2018-03-09 LAB — COMPLETE METABOLIC PANEL WITH GFR
AG RATIO: 1.5 (calc) (ref 1.0–2.5)
ALT: 25 U/L (ref 9–46)
AST: 36 U/L — AB (ref 10–35)
Albumin: 4.1 g/dL (ref 3.6–5.1)
Alkaline phosphatase (APISO): 39 U/L — ABNORMAL LOW (ref 40–115)
BUN/Creatinine Ratio: 19 (calc) (ref 6–22)
BUN: 38 mg/dL — AB (ref 7–25)
CALCIUM: 9.7 mg/dL (ref 8.6–10.3)
CHLORIDE: 106 mmol/L (ref 98–110)
CO2: 27 mmol/L (ref 20–32)
Creat: 1.99 mg/dL — ABNORMAL HIGH (ref 0.70–1.11)
GFR, EST AFRICAN AMERICAN: 34 mL/min/{1.73_m2} — AB (ref >=60)
GFR, EST NON AFRICAN AMERICAN: 30 mL/min/{1.73_m2} — AB (ref >=60)
GLUCOSE: 124 mg/dL (ref 65–139)
Globulin: 2.7 g/dL (calc) (ref 1.9–3.7)
POTASSIUM: 4.7 mmol/L (ref 3.5–5.3)
Sodium: 140 mmol/L (ref 135–146)
Total Bilirubin: 0.5 mg/dL (ref 0.2–1.2)
Total Protein: 6.8 g/dL (ref 6.1–8.1)

## 2018-03-09 LAB — HEMOGLOBIN A1C
HEMOGLOBIN A1C: 7.3 %{Hb} — AB (ref <5.7)
MEAN PLASMA GLUCOSE: 163 (calc)
eAG (mmol/L): 9 (calc)

## 2018-03-31 DIAGNOSIS — B351 Tinea unguium: Secondary | ICD-10-CM | POA: Diagnosis not present

## 2018-03-31 DIAGNOSIS — E1142 Type 2 diabetes mellitus with diabetic polyneuropathy: Secondary | ICD-10-CM | POA: Diagnosis not present

## 2018-04-17 DIAGNOSIS — N183 Chronic kidney disease, stage 3 (moderate): Secondary | ICD-10-CM | POA: Diagnosis not present

## 2018-04-17 DIAGNOSIS — I10 Essential (primary) hypertension: Secondary | ICD-10-CM | POA: Diagnosis not present

## 2018-04-17 DIAGNOSIS — E559 Vitamin D deficiency, unspecified: Secondary | ICD-10-CM | POA: Diagnosis not present

## 2018-04-17 DIAGNOSIS — R809 Proteinuria, unspecified: Secondary | ICD-10-CM | POA: Diagnosis not present

## 2018-04-17 DIAGNOSIS — D509 Iron deficiency anemia, unspecified: Secondary | ICD-10-CM | POA: Diagnosis not present

## 2018-04-17 DIAGNOSIS — Z79899 Other long term (current) drug therapy: Secondary | ICD-10-CM | POA: Diagnosis not present

## 2018-04-18 ENCOUNTER — Ambulatory Visit (INDEPENDENT_AMBULATORY_CARE_PROVIDER_SITE_OTHER): Payer: Medicare Other | Admitting: *Deleted

## 2018-04-18 DIAGNOSIS — E1129 Type 2 diabetes mellitus with other diabetic kidney complication: Secondary | ICD-10-CM | POA: Diagnosis not present

## 2018-04-18 DIAGNOSIS — I10 Essential (primary) hypertension: Secondary | ICD-10-CM | POA: Diagnosis not present

## 2018-04-18 DIAGNOSIS — N183 Chronic kidney disease, stage 3 (moderate): Secondary | ICD-10-CM | POA: Diagnosis not present

## 2018-04-18 DIAGNOSIS — I495 Sick sinus syndrome: Secondary | ICD-10-CM | POA: Diagnosis not present

## 2018-04-18 DIAGNOSIS — R809 Proteinuria, unspecified: Secondary | ICD-10-CM | POA: Diagnosis not present

## 2018-04-18 LAB — CUP PACEART INCLINIC DEVICE CHECK
Brady Statistic RV Percent Paced: 97 %
Implantable Lead Implant Date: 20100715
Implantable Lead Location: 753860
Lead Channel Pacing Threshold Amplitude: 0.75 V
Lead Channel Pacing Threshold Pulse Width: 0.4 ms
Lead Channel Sensing Intrinsic Amplitude: 1.6 mV
Lead Channel Setting Pacing Amplitude: 2.5 V
Lead Channel Setting Sensing Sensitivity: 2 mV
MDC IDC LEAD IMPLANT DT: 20100715
MDC IDC LEAD LOCATION: 753859
MDC IDC MSMT BATTERY REMAINING LONGEVITY: 21 mo
MDC IDC MSMT BATTERY REMAINING PERCENTAGE: 17 %
MDC IDC MSMT BATTERY VOLTAGE: 2.75 V
MDC IDC MSMT LEADCHNL RA PACING THRESHOLD AMPLITUDE: 0.5 V
MDC IDC MSMT LEADCHNL RV PACING THRESHOLD PULSEWIDTH: 0.4 ms
MDC IDC PG IMPLANT DT: 20100715
MDC IDC PG SERIAL: 2285839
MDC IDC SESS DTM: 20191008133302
MDC IDC SET LEADCHNL RA PACING AMPLITUDE: 1.5 V
MDC IDC SET LEADCHNL RV PACING PULSEWIDTH: 0.4 ms
MDC IDC STAT BRADY RA PERCENT PACED: 4.3 %
Pulse Gen Model: 2110

## 2018-04-18 NOTE — Progress Notes (Signed)
Pacemaker check in clinic. Normal device function. Thresholds, sensing, impedances consistent with previous measurements. Device programmed to maximize longevity. <0.1% AT/AF burden, max dur. 10mins (12/15/17) - AT per EGMs. No high ventricular rates noted. Device programmed at appropriate safety margins. RV auto cap d/c'd d/t frequent "out of range" measurements - output fixed @ 2.5V@0 .80ms. Histogram distribution appropriate for patient activity level. Device programmed to optimize intrinsic conduction. Estimated longevity 1.9 years. Patient will follow up with GT/R in 6 months. Patient education completed.

## 2018-04-25 DIAGNOSIS — I10 Essential (primary) hypertension: Secondary | ICD-10-CM | POA: Diagnosis not present

## 2018-04-25 DIAGNOSIS — Z79899 Other long term (current) drug therapy: Secondary | ICD-10-CM | POA: Diagnosis not present

## 2018-04-25 DIAGNOSIS — N183 Chronic kidney disease, stage 3 (moderate): Secondary | ICD-10-CM | POA: Diagnosis not present

## 2018-05-01 ENCOUNTER — Other Ambulatory Visit: Payer: Self-pay

## 2018-05-01 MED ORDER — GLUCOSE BLOOD VI STRP: 1.0000 | ORAL_STRIP | Freq: Two times a day (BID) | 5 refills | Status: DC

## 2018-05-08 DIAGNOSIS — Z950 Presence of cardiac pacemaker: Secondary | ICD-10-CM | POA: Diagnosis not present

## 2018-05-08 DIAGNOSIS — N183 Chronic kidney disease, stage 3 (moderate): Secondary | ICD-10-CM | POA: Diagnosis not present

## 2018-05-08 DIAGNOSIS — E1165 Type 2 diabetes mellitus with hyperglycemia: Secondary | ICD-10-CM | POA: Diagnosis not present

## 2018-05-08 DIAGNOSIS — I10 Essential (primary) hypertension: Secondary | ICD-10-CM | POA: Diagnosis not present

## 2018-05-08 DIAGNOSIS — Z23 Encounter for immunization: Secondary | ICD-10-CM | POA: Diagnosis not present

## 2018-06-16 DIAGNOSIS — B351 Tinea unguium: Secondary | ICD-10-CM | POA: Diagnosis not present

## 2018-06-16 DIAGNOSIS — M79674 Pain in right toe(s): Secondary | ICD-10-CM | POA: Diagnosis not present

## 2018-06-16 DIAGNOSIS — E1142 Type 2 diabetes mellitus with diabetic polyneuropathy: Secondary | ICD-10-CM | POA: Diagnosis not present

## 2018-07-07 DIAGNOSIS — N183 Chronic kidney disease, stage 3 (moderate): Secondary | ICD-10-CM | POA: Diagnosis not present

## 2018-07-07 DIAGNOSIS — E1165 Type 2 diabetes mellitus with hyperglycemia: Secondary | ICD-10-CM | POA: Diagnosis not present

## 2018-07-07 DIAGNOSIS — E1122 Type 2 diabetes mellitus with diabetic chronic kidney disease: Secondary | ICD-10-CM | POA: Diagnosis not present

## 2018-07-08 LAB — COMPLETE METABOLIC PANEL WITH GFR
AG Ratio: 1.6 (calc) (ref 1.0–2.5)
ALT: 23 U/L (ref 9–46)
AST: 34 U/L (ref 10–35)
Albumin: 4.2 g/dL (ref 3.6–5.1)
Alkaline phosphatase (APISO): 40 U/L (ref 40–115)
BUN/Creatinine Ratio: 19 (calc) (ref 6–22)
BUN: 42 mg/dL — AB (ref 7–25)
CALCIUM: 10 mg/dL (ref 8.6–10.3)
CO2: 28 mmol/L (ref 20–32)
Chloride: 108 mmol/L (ref 98–110)
Creat: 2.26 mg/dL — ABNORMAL HIGH (ref 0.70–1.11)
GFR, EST AFRICAN AMERICAN: 29 mL/min/{1.73_m2} — AB (ref >=60)
GFR, Est Non African American: 25 mL/min/{1.73_m2} — ABNORMAL LOW (ref >=60)
GLUCOSE: 93 mg/dL (ref 65–139)
Globulin: 2.7 g/dL (calc) (ref 1.9–3.7)
Potassium: 4.6 mmol/L (ref 3.5–5.3)
Sodium: 143 mmol/L (ref 135–146)
TOTAL PROTEIN: 6.9 g/dL (ref 6.1–8.1)
Total Bilirubin: 0.6 mg/dL (ref 0.2–1.2)

## 2018-07-08 LAB — HEMOGLOBIN A1C
EAG (MMOL/L): 8.9 (calc)
HEMOGLOBIN A1C: 7.2 %{Hb} — AB (ref <5.7)
MEAN PLASMA GLUCOSE: 160 (calc)

## 2018-07-10 ENCOUNTER — Ambulatory Visit (INDEPENDENT_AMBULATORY_CARE_PROVIDER_SITE_OTHER): Payer: Medicare Other | Admitting: "Endocrinology

## 2018-07-10 ENCOUNTER — Encounter: Payer: Self-pay | Admitting: "Endocrinology

## 2018-07-10 VITALS — BP 135/71 | HR 89 | Ht 72.0 in | Wt 210.0 lb

## 2018-07-10 DIAGNOSIS — E782 Mixed hyperlipidemia: Secondary | ICD-10-CM

## 2018-07-10 DIAGNOSIS — E1122 Type 2 diabetes mellitus with diabetic chronic kidney disease: Secondary | ICD-10-CM

## 2018-07-10 DIAGNOSIS — N183 Chronic kidney disease, stage 3 (moderate): Secondary | ICD-10-CM | POA: Diagnosis not present

## 2018-07-10 DIAGNOSIS — E1165 Type 2 diabetes mellitus with hyperglycemia: Secondary | ICD-10-CM | POA: Diagnosis not present

## 2018-07-10 DIAGNOSIS — I10 Essential (primary) hypertension: Secondary | ICD-10-CM | POA: Diagnosis not present

## 2018-07-10 DIAGNOSIS — IMO0002 Reserved for concepts with insufficient information to code with codable children: Secondary | ICD-10-CM

## 2018-07-10 MED ORDER — GLIPIZIDE 5 MG PO TABS: 5.0000 mg | ORAL_TABLET | Freq: Two times a day (BID) | ORAL | 3 refills | Status: DC

## 2018-07-10 NOTE — Progress Notes (Signed)
Consult follow-up note       07/10/2018, 2:27 PM   Subjective:    Patient ID: Kyle Schultz, male    DOB: February 10, 1930.  Kyle Schultz is being seen in follow-up  for management of currently uncontrolled symptomatic uncontrolled type 2 diabetes complicated by renal insufficiency, hypertension, hyperlipidemia. PMD:   Rosita Fire, MD.   Past Medical History:  Diagnosis Date  . CKD (chronic kidney disease), stage I   . Diabetes mellitus   . First degree AV block   . Hyperlipidemia   . Hypertension   . Second degree AV block   . Sinus bradycardia    Past Surgical History:  Procedure Laterality Date  . COLONOSCOPY    . colonscopy    . HEMORRHOID SURGERY    . PACEMAKER INSERTION  2010   [facility]  . UMBILICAL HERNIA REPAIR  04/2008   Social History   Socioeconomic History  . Marital status: Widowed    Spouse name: Not on file  . Number of children: Not on file  . Years of education: Not on file  . Highest education level: Not on file  Occupational History  . 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 Financial resource strain: Not on file  . Food insecurity:    Worry: Not on file    Inability: Not on file  . Transportation needs:    Medical: Not on file    Non-medical: Not on file  Tobacco Use  . Smoking status: Never Smoker  . Smokeless tobacco: Never Used  Substance and Sexual Activity  . Alcohol use: No    Alcohol/week: 0.0 standard drinks  . Drug use: No  . Sexual activity: Never    Birth control/protection: Abstinence  Lifestyle  . Physical activity:    Days per week: Not on file    Minutes per session: Not on file  . Stress: Not on file  Relationships  . Social connections:    Talks on phone: Not on file    Gets together: Not on file    Attends religious service: Not on file    Active member of club or organization: Not on file    Attends meetings of clubs or  organizations: Not on file    Relationship status: Not on file  Other Topics Concern  . Not on file  Social History Narrative   Widowed   1 child   Walks daily   Outpatient Encounter Medications as of 07/10/2018  Medication Sig  . aspirin 81 MG tablet Take 81 mg by mouth every evening.   . docusate sodium (COLACE) 100 MG capsule Take 100 mg by mouth daily as needed for mild constipation.  . fenofibrate 160 MG tablet Take 160 mg by mouth every evening.   . furosemide (LASIX) 40 MG tablet daily.  Marland Kitchen glipiZIDE (GLUCOTROL) 5 MG tablet Take 1 tablet (5 mg total) by mouth 2 (two) times daily with a meal.  . glucose blood test strip 1 each by Other route 2 (two) times daily. Use as instructed 2 x daily. E11.65 EZ MAX  . linagliptin (TRADJENTA) 5 MG  TABS tablet Take 5 mg by mouth daily.  Marland Kitchen losartan (COZAAR) 100 MG tablet Take 100 mg by mouth daily.   . simvastatin (ZOCOR) 20 MG tablet daily.  . [DISCONTINUED] glipiZIDE (GLUCOTROL) 10 MG tablet Take 5 mg by mouth 2 (two) times daily.   No facility-administered encounter medications on file as of 07/10/2018.     ALLERGIES: Allergies  Allergen Reactions  . Other     "some kind of fluid pill I can't take"     VACCINATION STATUS: Immunization History  Administered Date(s) Administered  . Influenza, High Dose Seasonal PF 04/01/2017    Diabetes  He presents for his follow-up diabetic visit. He has type 2 diabetes mellitus. Onset time: Patient is not sure when he was diagnosed with diabetes, at approximate age of 8 years. His disease course has been stable. There are no hypoglycemic associated symptoms. Pertinent negatives for hypoglycemia include no confusion, headaches, pallor or seizures. Associated symptoms include blurred vision. Pertinent negatives for diabetes include no chest pain, no fatigue, no polydipsia, no polyphagia, no polyuria and no weakness. There are no hypoglycemic complications. Symptoms are stable. Diabetic complications  include nephropathy. Risk factors for coronary artery disease include dyslipidemia, diabetes mellitus, hypertension and male sex. Current diabetic treatments: He is on glipizide 10 mg p.o. twice daily, Tradjenta 5 mg p.o. once a day. His weight is stable. He is following a generally unhealthy diet. When asked about meal planning, he reported none. He has not had a previous visit with a dietitian. He never participates in exercise. His breakfast blood glucose range is generally 130-140 mg/dl. His bedtime blood glucose range is generally 130-140 mg/dl. His overall blood glucose range is 130-140 mg/dl. (  ) An ACE inhibitor/angiotensin II receptor blocker is being taken. He does not see a podiatrist.Eye exam is current.  Hyperlipidemia  This is a chronic problem. The problem is controlled. Exacerbating diseases include diabetes. Pertinent negatives include no chest pain, myalgias or shortness of breath. Current antihyperlipidemic treatment includes statins. Risk factors for coronary artery disease include dyslipidemia, diabetes mellitus, hypertension, male sex and a sedentary lifestyle.  Hypertension  This is a chronic problem. The current episode started more than 1 year ago. The problem is uncontrolled. Associated symptoms include blurred vision. Pertinent negatives include no chest pain, headaches, neck pain, palpitations or shortness of breath. Past treatments include angiotensin blockers. Hypertensive end-organ damage includes kidney disease.    Review of Systems  Constitutional: Negative for chills, fatigue, fever and unexpected weight change.  HENT: Negative for dental problem, mouth sores and trouble swallowing.   Eyes: Positive for blurred vision. Negative for visual disturbance.  Respiratory: Negative for cough, choking, chest tightness, shortness of breath and wheezing.   Cardiovascular: Negative for chest pain, palpitations and leg swelling.  Gastrointestinal: Negative for abdominal  distention, abdominal pain, constipation, diarrhea, nausea and vomiting.  Endocrine: Negative for polydipsia, polyphagia and polyuria.  Genitourinary: Negative for dysuria, flank pain, hematuria and urgency.  Musculoskeletal: Negative for back pain, gait problem, myalgias and neck pain.  Skin: Negative for pallor, rash and wound.  Neurological: Negative for seizures, syncope, weakness, numbness and headaches.  Psychiatric/Behavioral: Negative for confusion and dysphoric mood.    Objective:    BP 135/71   Pulse 89   Ht 6' (1.829 m)   Wt 210 lb (95.3 kg)   BMI 28.48 kg/m   Wt Readings from Last 3 Encounters:  07/10/18 210 lb (95.3 kg)  03/08/18 213 lb (96.6 kg)  11/03/17 210 lb (  95.3 kg)     Physical Exam  Constitutional: He is oriented to person, place, and time. He appears well-developed. He is cooperative. No distress.  HENT:  Head: Normocephalic and atraumatic.  Eyes: EOM are normal.  Neck: Normal range of motion. Neck supple. No tracheal deviation present. No thyromegaly present.  Cardiovascular: Normal rate, S1 normal and S2 normal. Exam reveals no gallop.  No murmur heard. Pulses:      Dorsalis pedis pulses are 1+ on the right side and 1+ on the left side.       Posterior tibial pulses are 1+ on the right side and 1+ on the left side.  Pulmonary/Chest: Effort normal. No respiratory distress. He has no wheezes.  Abdominal: He exhibits no distension. There is no abdominal tenderness. There is no guarding and no CVA tenderness.  Musculoskeletal:        General: No edema.     Right shoulder: He exhibits no swelling and no deformity.  Neurological: He is alert and oriented to person, place, and time. He has normal strength and normal reflexes. No cranial nerve deficit or sensory deficit. Gait normal.  Patient is significantly hard of hearing.  Skin: Skin is warm and dry. No rash noted. No cyanosis. Nails show no clubbing.  Psychiatric: He has a normal mood and affect. His  speech is normal. Cognition and memory are normal.     CMP ( most recent) CMP     Component Value Date/Time   NA 143 07/07/2018 1230   K 4.6 07/07/2018 1230   CL 108 07/07/2018 1230   CO2 28 07/07/2018 1230   GLUCOSE 93 07/07/2018 1230   BUN 42 (H) 07/07/2018 1230   CREATININE 2.26 (H) 07/07/2018 1230   CALCIUM 10.0 07/07/2018 1230   PROT 6.9 07/07/2018 1230   ALBUMIN 4.2 09/24/2017 1811   AST 34 07/07/2018 1230   ALT 23 07/07/2018 1230   ALKPHOS 43 09/24/2017 1811   BILITOT 0.6 07/07/2018 1230   GFRNONAA 25 (L) 07/07/2018 1230   GFRAA 29 (L) 07/07/2018 1230     Diabetic Labs (most recent): Lab Results  Component Value Date   HGBA1C 7.2 (H) 07/07/2018   HGBA1C 7.3 (H) 03/08/2018   HGBA1C 7.5 (H) 10/20/2017     Lipid Panel ( most recent) Lipid Panel     Component Value Date/Time   CHOL 130 07/07/2017   TRIG 101 07/07/2017   HDL 55 07/07/2017   CHOLHDL 4.3 Ratio 08/01/2009 1829   VLDL 33 08/01/2009 1829   LDLCALC 57 07/07/2017       Assessment & Plan:   1. Uncontrolled type 2 diabetes mellitus with stage 3 chronic kidney disease ([place]), conduction abnormality of the heart - Kyle Schultz has currently uncontrolled symptomatic type 2 DM since approximately 84 years of age. -He returns with continued improvement in his glycemic profile, A1c of 7.2% overall improvement from 10.5%.   - Recent labs reviewed, showing stage 3-4 renal insufficiency.  -his diabetes is complicated by stage 3-4 renal insufficiency, lack of adequate social support and YAASIR MENKEN remains at a high risk for more acute and chronic complications which include CAD, CVA, CKD, retinopathy, and neuropathy. These are all discussed in detail with the patient.  - I have counseled him on diet management  by adopting a carbohydrate restricted/protein rich diet.  -  Suggestion is made for him to avoid simple carbohydrates  from his diet including Cakes, Sweet Desserts / Pastries, Ice Cream, Soda  (diet  and regular), Sweet Tea, Candies, Chips, Cookies, Store Bought Juices, Alcohol in Excess of  1-2 drinks a day, Artificial Sweeteners, and "Sugar-free" Products. This will help patient to have stable blood glucose profile and potentially avoid unintended weight gain.   - I encouraged him to switch to  unprocessed or minimally processed complex starch and increased protein intake (animal or plant source), fruits, and vegetables.  - he is advised to stick to a routine mealtimes to eat 3 meals  a day and avoid unnecessary snacks ( to snack only to correct hypoglycemia).   - I have approached him with the following individualized plan to manage diabetes and patient agrees:     -Given the fact that he is at particularly high risk for hypoglycemia from inadvertent/improper use of insulin, he is not a suitable candidate to give insulin to. -He does not have adequate support at home.  -I will continue with non-insulin treatment for him .  -#1 priority in this patient would be to avoid hypoglycemia.  He is advised to lower his glipizide to 5 mg  p.o. twice daily with breakfast and supper, and Tradjenta 5 mg p.o. daily with breakfast.   -He agrees to continue monitoring blood glucose at least twice a day-daily before breakfast and before he is going to bed .  -Patient is encouraged to call clinic for blood glucose levels less than 70 or above 300 mg /dl.  -Patient is not a candidate for SGLT2 inhibitors due to CKD.  - Patient specific target  A1c;  LDL, HDL, Triglycerides, and  Waist Circumference were discussed in detail.  2) BP/HTN: His blood pressure is controlled to target.   He is advised to continue losartan 100 mg p.o. daily.   3) Lipids/HPL: His lipid panel from December is favorable with LDL of 57.  He may not benefit from the statins any longer.  He is advised to continue simvastatin.   4)  Weight/Diet: CDE Consult will be initiated , exercise, and detailed carbohydrates information  provided.  5) Chronic Care/Health Maintenance:  -he  is on ACEI/ARB and Statin medications and  is encouraged to continue to follow up with Ophthalmology, Dentist,  Podiatrist at least yearly or according to recommendations, and advised to stay away from smoking. I have recommended yearly flu vaccine and pneumonia vaccination at least every 5 years; moderate intensity exercise for up to 150 minutes weekly; and  sleep for at least 7 hours a day.  - I advised patient to maintain close follow up with Rosita Fire, MD for primary care needs.  - Time spent with the patient: 25 min, of which >50% was spent in reviewing his blood glucose logs , discussing his hypo- and hyper-glycemic episodes, reviewing his current and  previous labs and insulin doses and developing a plan to avoid hypo- and hyper-glycemia. Please refer to Patient Instructions for Blood Glucose Monitoring and Insulin/Medications Dosing Guide"  in media tab for additional information. Kyle Schultz participated in the discussions, expressed understanding, and voiced agreement with the above plans.  All questions were answered to his satisfaction. he is encouraged to contact clinic should he have any questions or concerns prior to his return visit.    Follow up plan: - Return in about 6 months (around 01/09/2019) for Meter, and Logs.  Glade Lloyd, MD [facility] [address] [place], [place] [address] Phone: [phone]  Fax: [phone]    07/10/2018, 2:27 PM  This note was partially  dictated with voice recognition software. Similar sounding words can be transcribed inadequately or may not  be corrected upon review.

## 2018-07-21 DIAGNOSIS — R809 Proteinuria, unspecified: Secondary | ICD-10-CM | POA: Diagnosis not present

## 2018-07-21 DIAGNOSIS — I10 Essential (primary) hypertension: Secondary | ICD-10-CM | POA: Diagnosis not present

## 2018-07-21 DIAGNOSIS — D509 Iron deficiency anemia, unspecified: Secondary | ICD-10-CM | POA: Diagnosis not present

## 2018-07-21 DIAGNOSIS — N183 Chronic kidney disease, stage 3 (moderate): Secondary | ICD-10-CM | POA: Diagnosis not present

## 2018-07-21 DIAGNOSIS — Z79899 Other long term (current) drug therapy: Secondary | ICD-10-CM | POA: Diagnosis not present

## 2018-07-21 DIAGNOSIS — E559 Vitamin D deficiency, unspecified: Secondary | ICD-10-CM | POA: Diagnosis not present

## 2018-07-25 DIAGNOSIS — I10 Essential (primary) hypertension: Secondary | ICD-10-CM | POA: Diagnosis not present

## 2018-07-25 DIAGNOSIS — E1129 Type 2 diabetes mellitus with other diabetic kidney complication: Secondary | ICD-10-CM | POA: Diagnosis not present

## 2018-07-25 DIAGNOSIS — N184 Chronic kidney disease, stage 4 (severe): Secondary | ICD-10-CM | POA: Diagnosis not present

## 2018-07-25 DIAGNOSIS — I509 Heart failure, unspecified: Secondary | ICD-10-CM | POA: Diagnosis not present

## 2018-08-08 DIAGNOSIS — E1165 Type 2 diabetes mellitus with hyperglycemia: Secondary | ICD-10-CM | POA: Diagnosis not present

## 2018-08-08 DIAGNOSIS — N183 Chronic kidney disease, stage 3 (moderate): Secondary | ICD-10-CM | POA: Diagnosis not present

## 2018-08-08 DIAGNOSIS — Z950 Presence of cardiac pacemaker: Secondary | ICD-10-CM | POA: Diagnosis not present

## 2018-08-08 DIAGNOSIS — I10 Essential (primary) hypertension: Secondary | ICD-10-CM | POA: Diagnosis not present

## 2018-08-25 DIAGNOSIS — E1142 Type 2 diabetes mellitus with diabetic polyneuropathy: Secondary | ICD-10-CM | POA: Diagnosis not present

## 2018-08-25 DIAGNOSIS — B351 Tinea unguium: Secondary | ICD-10-CM | POA: Diagnosis not present

## 2018-09-26 DIAGNOSIS — E559 Vitamin D deficiency, unspecified: Secondary | ICD-10-CM | POA: Diagnosis not present

## 2018-09-26 DIAGNOSIS — R809 Proteinuria, unspecified: Secondary | ICD-10-CM | POA: Diagnosis not present

## 2018-09-26 DIAGNOSIS — Z79899 Other long term (current) drug therapy: Secondary | ICD-10-CM | POA: Diagnosis not present

## 2018-09-26 DIAGNOSIS — D649 Anemia, unspecified: Secondary | ICD-10-CM | POA: Diagnosis not present

## 2018-09-26 DIAGNOSIS — I10 Essential (primary) hypertension: Secondary | ICD-10-CM | POA: Diagnosis not present

## 2018-09-26 DIAGNOSIS — N183 Chronic kidney disease, stage 3 (moderate): Secondary | ICD-10-CM | POA: Diagnosis not present

## 2018-10-12 ENCOUNTER — Telehealth: Payer: Self-pay | Admitting: *Deleted

## 2018-10-12 NOTE — Telephone Encounter (Signed)
   Cardiac Questionnaire:    Since your last visit or hospitalization:    1. Have you been having new or worsening chest pain? No   2. Have you been having new or worsening shortness of breath? No 3. Have you been having new or worsening leg swelling, wt gain, or increase in abdominal girth (pants fitting more tightly)? No    4. Have you had any passing out spells? No    *A YES to any of these questions would result in the appointment being kept. *If all the answers to these questions are NO, we should indicate that given the current situation regarding the worldwide coronarvirus pandemic, at the recommendation of the CDC, we are looking to limit gatherings in our waiting area, and thus will reschedule their appointment beyond four weeks from today.

## 2018-10-17 ENCOUNTER — Encounter: Payer: Medicare Other | Admitting: Internal Medicine

## 2018-11-07 DIAGNOSIS — I10 Essential (primary) hypertension: Secondary | ICD-10-CM | POA: Diagnosis not present

## 2018-11-07 DIAGNOSIS — N183 Chronic kidney disease, stage 3 (moderate): Secondary | ICD-10-CM | POA: Diagnosis not present

## 2018-11-07 DIAGNOSIS — E1165 Type 2 diabetes mellitus with hyperglycemia: Secondary | ICD-10-CM | POA: Diagnosis not present

## 2018-12-01 ENCOUNTER — Ambulatory Visit: Payer: Self-pay | Admitting: *Deleted

## 2018-12-01 DIAGNOSIS — N183 Chronic kidney disease, stage 3 (moderate): Secondary | ICD-10-CM | POA: Diagnosis not present

## 2018-12-01 DIAGNOSIS — I10 Essential (primary) hypertension: Secondary | ICD-10-CM | POA: Diagnosis not present

## 2018-12-01 DIAGNOSIS — E1165 Type 2 diabetes mellitus with hyperglycemia: Secondary | ICD-10-CM | POA: Diagnosis not present

## 2018-12-01 DIAGNOSIS — I442 Atrioventricular block, complete: Secondary | ICD-10-CM | POA: Diagnosis not present

## 2018-12-01 DIAGNOSIS — Z1389 Encounter for screening for other disorder: Secondary | ICD-10-CM | POA: Diagnosis not present

## 2018-12-01 DIAGNOSIS — Z1331 Encounter for screening for depression: Secondary | ICD-10-CM | POA: Diagnosis not present

## 2018-12-01 NOTE — Progress Notes (Signed)
Received an incoming call from patient stating he was "supposed to see Dr. Lovena Le but he could not get in the door".  Made patient aware I was not on his care tem but could look in his EMR to find the number for Dr Lovena Le. Patient agreed. HIPAA identifiers confirmed.  Patient given the number for: Evans Lance, MD Electrophysiology  [phone] Patient plans to cal the office to confirm appointment date and time.   Merlene Morse Selden Noteboom RN, BSN Nurse Case Editor, commissioning [facility]  [phone]) Business Mobile

## 2018-12-05 DIAGNOSIS — E1165 Type 2 diabetes mellitus with hyperglycemia: Secondary | ICD-10-CM | POA: Diagnosis not present

## 2018-12-05 DIAGNOSIS — N183 Chronic kidney disease, stage 3 (moderate): Secondary | ICD-10-CM | POA: Diagnosis not present

## 2018-12-05 DIAGNOSIS — I10 Essential (primary) hypertension: Secondary | ICD-10-CM | POA: Diagnosis not present

## 2018-12-05 DIAGNOSIS — E785 Hyperlipidemia, unspecified: Secondary | ICD-10-CM | POA: Diagnosis not present

## 2018-12-27 ENCOUNTER — Telehealth: Payer: Self-pay | Admitting: *Deleted

## 2018-12-27 ENCOUNTER — Telehealth (INDEPENDENT_AMBULATORY_CARE_PROVIDER_SITE_OTHER): Payer: Medicare Other | Admitting: Internal Medicine

## 2018-12-27 DIAGNOSIS — I10 Essential (primary) hypertension: Secondary | ICD-10-CM

## 2018-12-27 DIAGNOSIS — I495 Sick sinus syndrome: Secondary | ICD-10-CM | POA: Diagnosis not present

## 2018-12-27 DIAGNOSIS — I442 Atrioventricular block, complete: Secondary | ICD-10-CM

## 2018-12-27 NOTE — Patient Instructions (Signed)
Medication Instructions:  Your physician recommends that you continue on your current medications as directed. Please refer to the Current Medication list given to you today.  If you need a refill on your cardiac medications before your next appointment, please call your pharmacy.   Lab work: NONE  If you have labs (blood work) drawn today and your tests are completely normal, you will receive your results only by: Marland Kitchen MyChart Message (if you have MyChart) OR . A paper copy in the mail If you have any lab test that is abnormal or we need to change your treatment, we will call you to review the results.  Testing/Procedures: NONE   Follow-Up: At [facility], you and your health needs are our priority.  As part of our continuing mission to provide you with exceptional heart care, we have created designated Provider Care Teams.  These Care Teams include your primary Cardiologist (physician) and Advanced Practice Providers (APPs -  Physician Assistants and Nurse Practitioners) who all work together to provide you with the care you need, when you need it. You will need a follow up appointment in December .  Please call our office 2 months in advance to schedule this appointment.  You may see Cristopher Peru, MD or one of the following Advanced Practice Providers on your designated Care Team:   Chanetta Marshall, NP . Tommye Standard, PA-C  Any Other Special Instructions Will Be Listed Below (If Applicable). Thank you for choosing [facility]!

## 2018-12-27 NOTE — Telephone Encounter (Signed)

## 2018-12-27 NOTE — Progress Notes (Signed)
Electrophysiology TeleHealth Note   Due to national recommendations of social distancing due to COVID 19, an audio/video telehealth visit is felt to be most appropriate for this patient at this time.  See MyChart message from today for the patient's consent to telehealth for Kyle Schultz.   Date:  12/27/2018   ID:  Kyle Schultz, DOB 1930/07/10, MRN [id number]  Location: patient's home  Provider location: [address], [place]  Evaluation Performed: Follow-up visit  PCP:  Rosita Fire, MD  Cardiologist:  No primary care provider on file.  Electrophysiologist:  Dr Lovena Le  Chief Complaint:  "I ain't doing nothing."  History of Present Illness:    Kyle Schultz is a 85 y.o. male who presents via audio/video conferencing for a telehealth visit today.  He is a pleasant 85 yo man with CHB, s/p PPM insertion. He has HTN. Since last being seen in our clinic, the patient reports doing very well. He plays cards with his friends.   Today, he denies symptoms of palpitations, chest pain, shortness of breath,  lower extremity edema, dizziness, presyncope, or syncope.  The patient is otherwise without complaint today.  The patient denies symptoms of fevers, chills, cough, or new SOB worrisome for COVID 19.  Past Medical History:  Diagnosis Date  . CKD (chronic kidney disease), stage I   . Diabetes mellitus   . First degree AV block   . Hyperlipidemia   . Hypertension   . Second degree AV block   . Sinus bradycardia     Past Surgical History:  Procedure Laterality Date  . COLONOSCOPY    . colonscopy    . HEMORRHOID SURGERY    . PACEMAKER INSERTION  2010   [facility]  . UMBILICAL HERNIA REPAIR  04/2008    Current Outpatient Medications  Medication Sig Dispense Refill  . aspirin 81 MG tablet Take 81 mg by mouth every evening.     . docusate sodium (COLACE) 100 MG capsule Take 100 mg by mouth daily as needed for mild constipation.    . fenofibrate 160 MG tablet Take  160 mg by mouth every evening.     . furosemide (LASIX) 40 MG tablet daily.    Marland Kitchen glipiZIDE (GLUCOTROL) 5 MG tablet Take 1 tablet (5 mg total) by mouth 2 (two) times daily with a meal. 60 tablet 3  . glucose blood test strip 1 each by Other route 2 (two) times daily. Use as instructed 2 x daily. E11.65 EZ MAX 100 each 5  . linagliptin (TRADJENTA) 5 MG TABS tablet Take 5 mg by mouth daily.    Marland Kitchen losartan (COZAAR) 100 MG tablet Take 100 mg by mouth daily.     . simvastatin (ZOCOR) 20 MG tablet daily.     No current facility-administered medications for this visit.     Allergies:   Other   Social History:  The patient  reports that he has never smoked. He has never used smokeless tobacco. He reports that he does not drink alcohol or use drugs.   Family History:  The patient's  family history includes ALS in his sister; Cancer in his sister, sister, and sister; Heart disease in his father.   ROS:  Please see the history of present illness.   All other systems are personally reviewed and negative.    Exam:    Vital Signs:  There were no vitals taken for this visit.   Labs/Other Tests and Data Reviewed:  Recent Labs: 07/07/2018: ALT 23; BUN 42; Creat 2.26; Potassium 4.6; Sodium 143   Wt Readings from Last 3 Encounters:  07/10/18 210 lb (95.3 kg)  03/08/18 213 lb (96.6 kg)  11/03/17 210 lb (95.3 kg)     Other studies personally reviewed:  Last device remote is reviewed from [facility] PDF dated 10/19 which reveals normal device function, no arrhythmias    ASSESSMENT & PLAN:    1.  CHB - he is asymptomatic, s/p PPM insertion. 2. PPM - his device has not been interogated but has about a year of longevity. He will need followup in the office in 6 months. 3. HTN - he is not checking his bp but thinks that it is controlled.   4. COVID 19 screen The patient denies symptoms of COVID 19 at this time.  The importance of social distancing was discussed today.  Follow-up:  78months with me  Next remote: n/a  Current medicines are reviewed at length with the patient today.   The patient does not have concerns regarding his medicines.  The following changes were made today:  none  Labs/ tests ordered today include: none No orders of the defined types were placed in this encounter.    Patient Risk:  after full review of this patients clinical status, I feel that they are at moderate risk at this time.  Today, I have spent 15 minutes with the patient with telehealth technology discussing all of the above .    Signed, Cristopher Peru, MD  12/27/2018 2:33 PM     [facility] [address] [place] [address] [phone] (office) [phone] (fax)

## 2019-01-01 DIAGNOSIS — I10 Essential (primary) hypertension: Secondary | ICD-10-CM | POA: Diagnosis not present

## 2019-01-01 DIAGNOSIS — E1165 Type 2 diabetes mellitus with hyperglycemia: Secondary | ICD-10-CM | POA: Diagnosis not present

## 2019-01-08 ENCOUNTER — Encounter: Payer: Self-pay | Admitting: "Endocrinology

## 2019-01-08 ENCOUNTER — Ambulatory Visit: Payer: Medicare Other | Admitting: "Endocrinology

## 2019-01-19 DIAGNOSIS — E1142 Type 2 diabetes mellitus with diabetic polyneuropathy: Secondary | ICD-10-CM | POA: Diagnosis not present

## 2019-01-19 DIAGNOSIS — B351 Tinea unguium: Secondary | ICD-10-CM | POA: Diagnosis not present

## 2019-01-30 ENCOUNTER — Encounter: Payer: Self-pay | Admitting: Gastroenterology

## 2019-01-31 DIAGNOSIS — E1165 Type 2 diabetes mellitus with hyperglycemia: Secondary | ICD-10-CM | POA: Diagnosis not present

## 2019-01-31 DIAGNOSIS — I10 Essential (primary) hypertension: Secondary | ICD-10-CM | POA: Diagnosis not present

## 2019-02-08 ENCOUNTER — Other Ambulatory Visit: Payer: Self-pay

## 2019-03-03 DIAGNOSIS — I10 Essential (primary) hypertension: Secondary | ICD-10-CM | POA: Diagnosis not present

## 2019-03-03 DIAGNOSIS — E785 Hyperlipidemia, unspecified: Secondary | ICD-10-CM | POA: Diagnosis not present

## 2019-03-07 DIAGNOSIS — H25813 Combined forms of age-related cataract, bilateral: Secondary | ICD-10-CM | POA: Diagnosis not present

## 2019-03-20 ENCOUNTER — Other Ambulatory Visit: Payer: Self-pay

## 2019-03-20 ENCOUNTER — Encounter: Payer: Self-pay | Admitting: Gastroenterology

## 2019-03-20 ENCOUNTER — Ambulatory Visit (INDEPENDENT_AMBULATORY_CARE_PROVIDER_SITE_OTHER): Payer: Medicare Other | Admitting: Gastroenterology

## 2019-03-20 DIAGNOSIS — Z86010 Personal history of colonic polyps: Secondary | ICD-10-CM

## 2019-03-20 NOTE — Progress Notes (Signed)
       Kyle Schultz is a very pleasant 85 y.o. male presenting today after receiving a recall letter for colonoscopy. Last was in 2010 with simple adenomas.  He has absolutely no concerning lower or upper GI signs/symptoms. He states he came in because the letter stated he was due for a colonoscopy. Although 10 year screening interval is appropriate in the right setting, due to his older age (63) and with comorbidities, we discussed that a colonoscopy was not necessary at this time, and benefits did not outweigh the risks. He agreed as well. No medications were prescribed at this visit or any change in patient's current care. From a GI perspective, he is doing well. He is to call if any issues, and we can see him back as needed. He will not be charged for an office visit today.  Annitta Needs, PhD, ANP-BC [facility]

## 2019-03-20 NOTE — Patient Instructions (Signed)
We will hold off on a colonoscopy right now due to your age and health history. However, if you have any issues with rectal bleeding, abdominal pain, constipation, diarrhea, reflux, weight loss, or food getting stuck, please call us.  Otherwise, we will see you back as needed!  Annitta Needs, PhD, ANP-BC [facility]

## 2019-03-30 DIAGNOSIS — E1142 Type 2 diabetes mellitus with diabetic polyneuropathy: Secondary | ICD-10-CM | POA: Diagnosis not present

## 2019-03-30 DIAGNOSIS — L988 Other specified disorders of the skin and subcutaneous tissue: Secondary | ICD-10-CM | POA: Diagnosis not present

## 2019-03-30 DIAGNOSIS — B351 Tinea unguium: Secondary | ICD-10-CM | POA: Diagnosis not present

## 2019-03-30 DIAGNOSIS — L851 Acquired keratosis [keratoderma] palmaris et plantaris: Secondary | ICD-10-CM | POA: Diagnosis not present

## 2019-04-03 DIAGNOSIS — E785 Hyperlipidemia, unspecified: Secondary | ICD-10-CM | POA: Diagnosis not present

## 2019-04-03 DIAGNOSIS — I10 Essential (primary) hypertension: Secondary | ICD-10-CM | POA: Diagnosis not present

## 2019-04-10 DIAGNOSIS — Z23 Encounter for immunization: Secondary | ICD-10-CM | POA: Diagnosis not present

## 2019-04-15 IMAGING — US US RENAL
1 series · 14 of 25 positions shown · non-contrast
Comparison: CT 01/24/2014

CLINICAL DATA: Chronic kidney disease stage 3, renal cysts,
hypertension

EXAM:
RENAL / URINARY TRACT ULTRASOUND COMPLETE

[Series 1: us renal · 0.25mm/px · 14 of 62 slices shown]
[im 1/62]
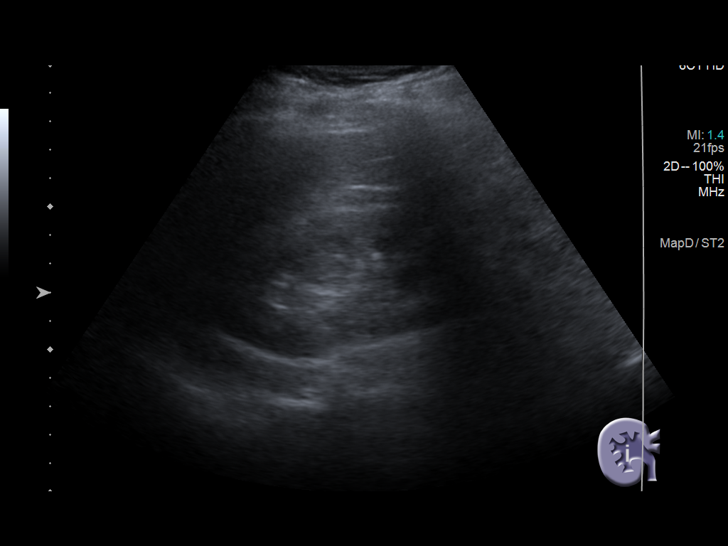
[im 6/62]
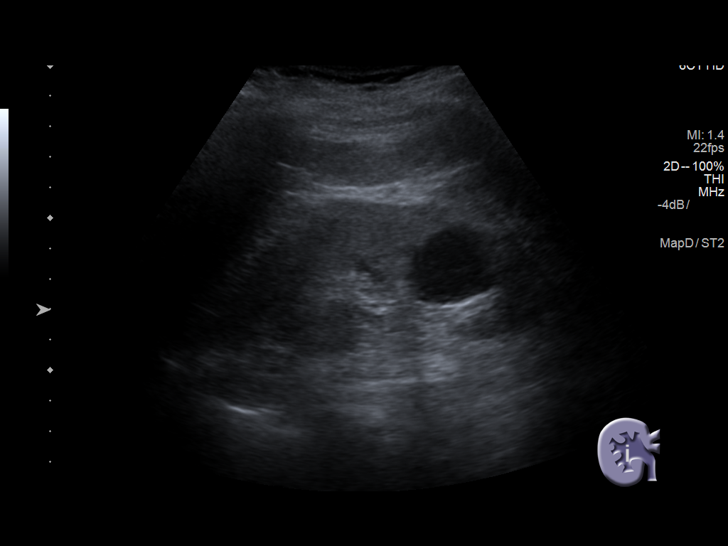
[im 11/62]
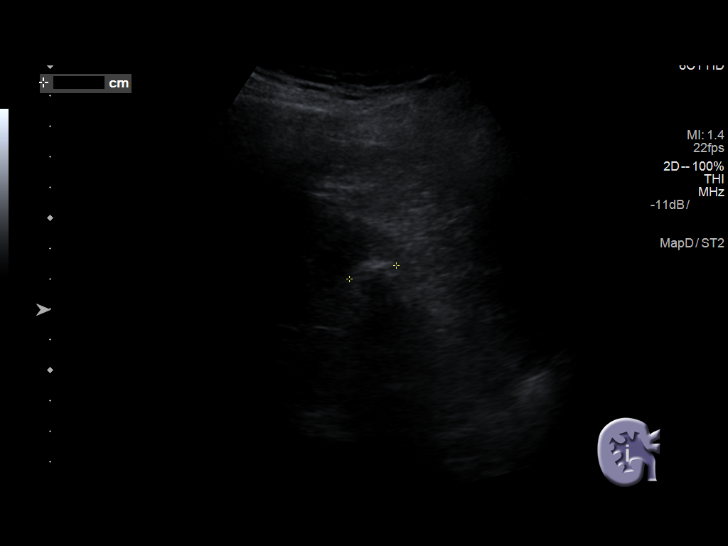
[im 16/62]
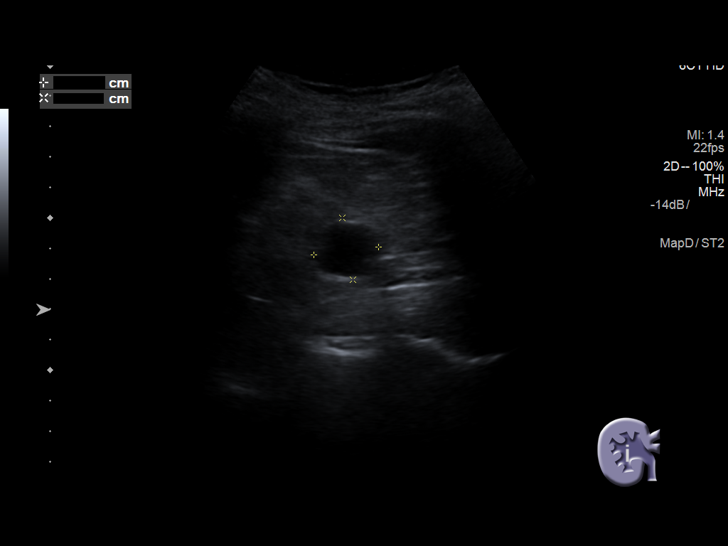
[im 21/62]
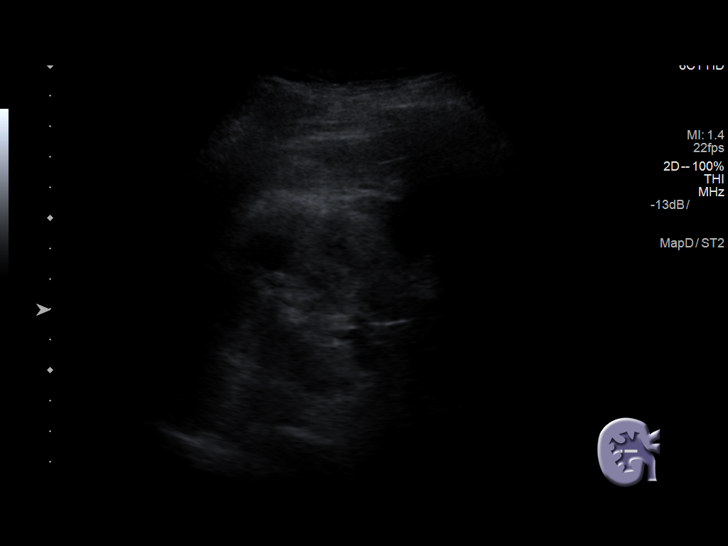
[im 23/62]
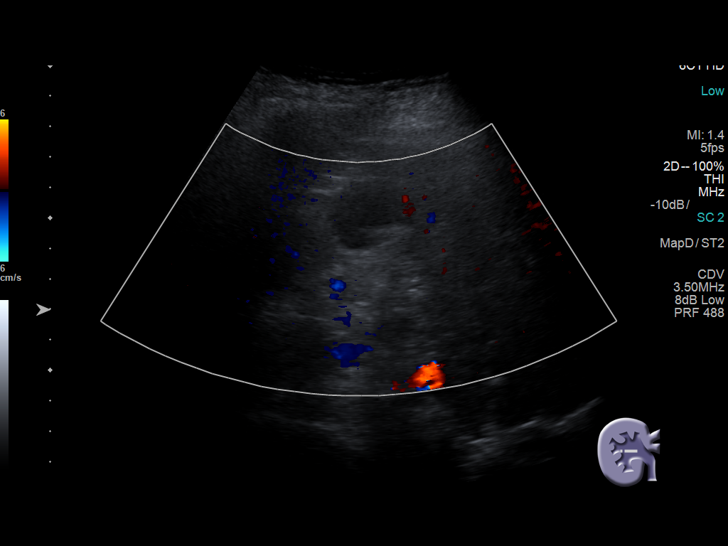
[im 28/62]
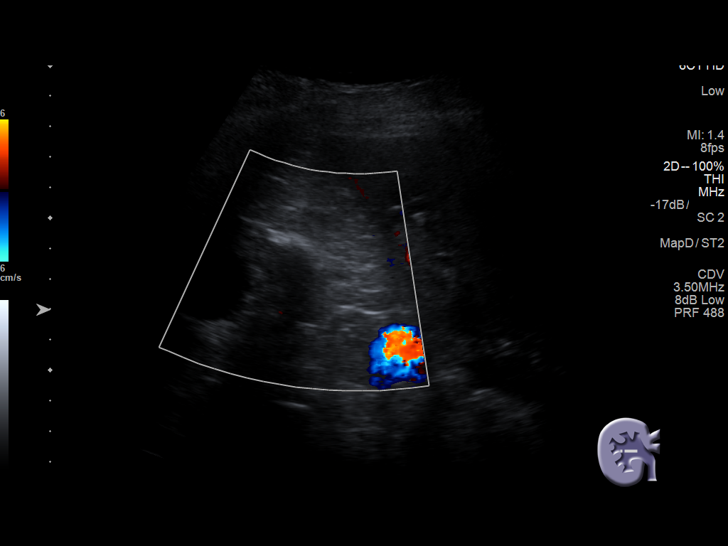
[im 34/62]
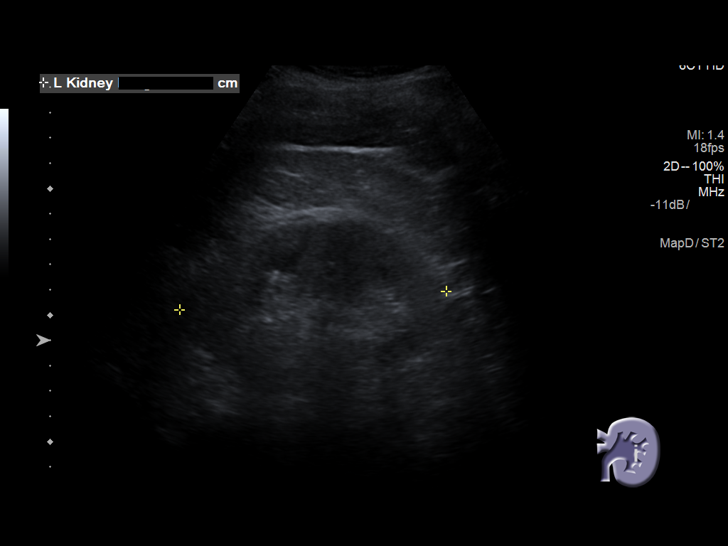
[im 39/62]
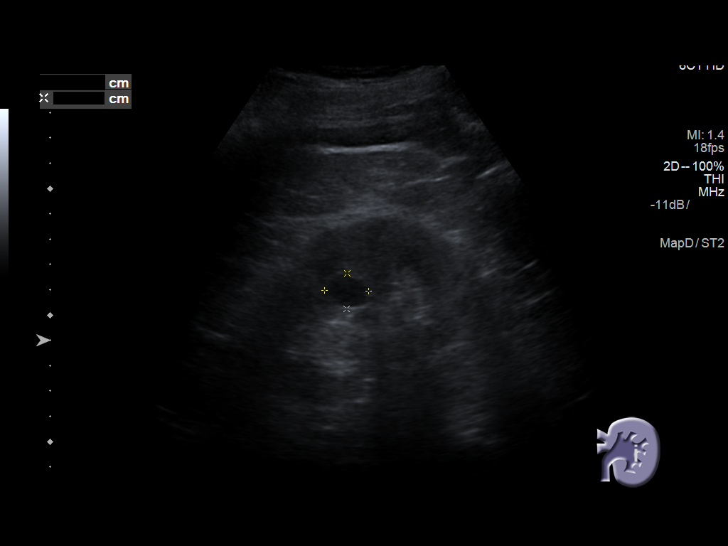
[im 41/62]
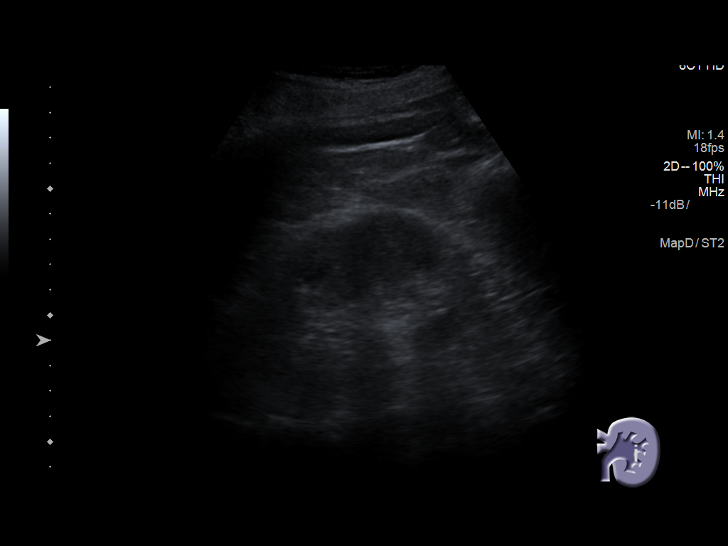
[im 46/62]
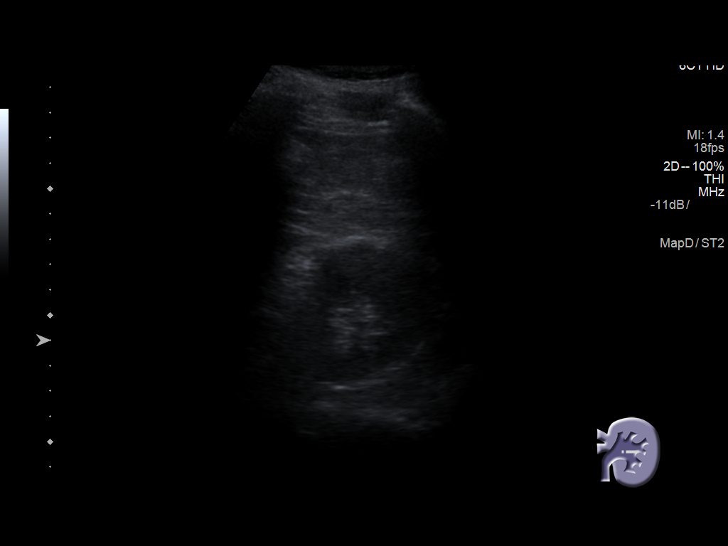
[im 51/62]
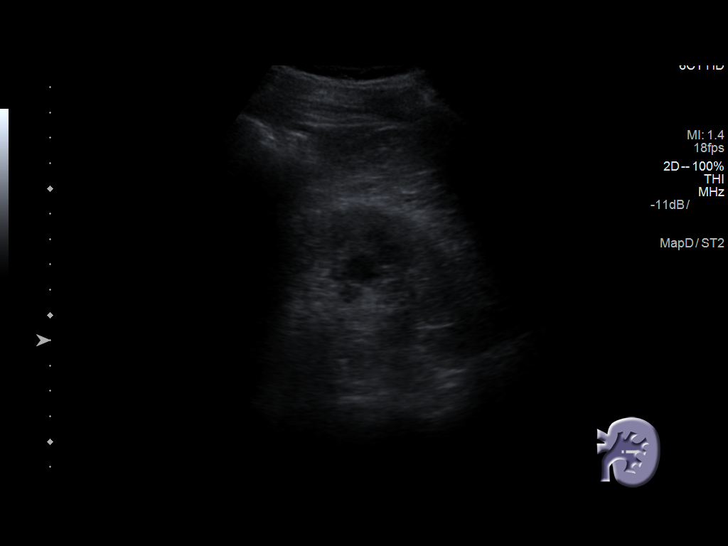
[im 56/62]
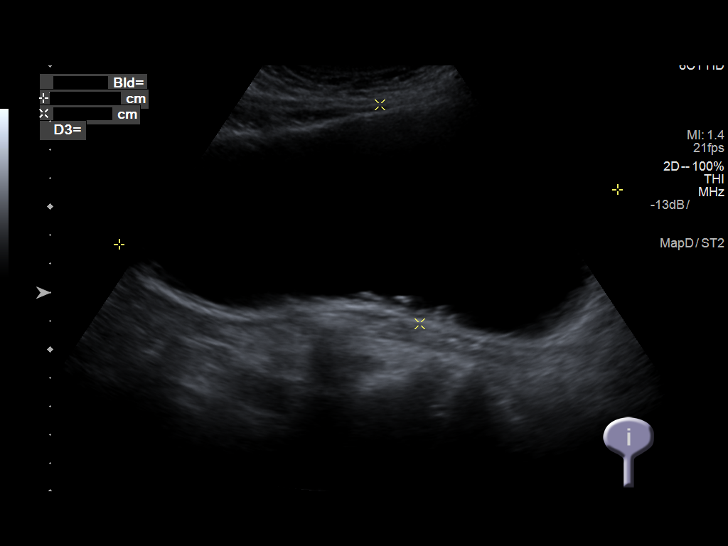
[im 62/62]
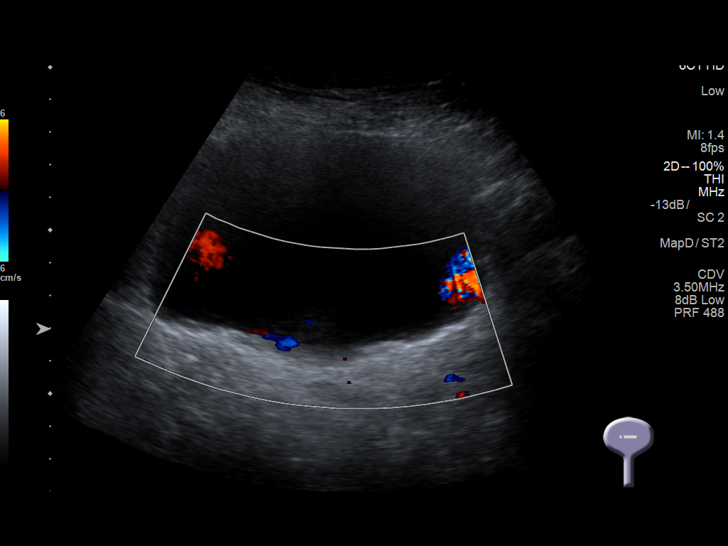

[14 of 25 positions shown; findings below may reference images not displayed]

FINDINGS: Right Kidney:

Length: 11.2 cm. No hydronephrosis. 3.5 x 2.6 x 3.3 cm simple
appearing lower pole cyst. 2.1 x 2.4 x 3.4 cm inferior cyst.
Peripherally calcified cortical lesion.

Left Kidney:

Length: 10.8 cm. No hydronephrosis. 1.7 x 1.4 x 1.5 cm parapelvic
cyst.

Bladder:

Physiologically distended. Mild mucosal irregularity. Bilateral
ureteral jets documented.
IMPRESSION: 1. Negative for hydronephrosis.
2. Bilateral renal cysts and calcified right renal lesion, as
partially visualized on prior lumbar CT of 01/24/2014.

## 2019-04-19 ENCOUNTER — Other Ambulatory Visit: Payer: Self-pay | Admitting: "Endocrinology

## 2019-04-19 DIAGNOSIS — E1122 Type 2 diabetes mellitus with diabetic chronic kidney disease: Secondary | ICD-10-CM | POA: Diagnosis not present

## 2019-04-19 DIAGNOSIS — N183 Chronic kidney disease, stage 3 unspecified: Secondary | ICD-10-CM | POA: Diagnosis not present

## 2019-04-19 DIAGNOSIS — IMO0002 Reserved for concepts with insufficient information to code with codable children: Secondary | ICD-10-CM

## 2019-04-19 DIAGNOSIS — E1165 Type 2 diabetes mellitus with hyperglycemia: Secondary | ICD-10-CM | POA: Diagnosis not present

## 2019-04-20 LAB — HEMOGLOBIN A1C
Hgb A1c MFr Bld: 6.8 % of total Hgb — ABNORMAL HIGH (ref <5.7)
Mean Plasma Glucose: 148 (calc)
eAG (mmol/L): 8.2 (calc)

## 2019-04-20 LAB — COMPREHENSIVE METABOLIC PANEL
AG Ratio: 1.4 (calc) (ref 1.0–2.5)
ALT: 17 U/L (ref 9–46)
AST: 26 U/L (ref 10–35)
Albumin: 3.9 g/dL (ref 3.6–5.1)
Alkaline phosphatase (APISO): 38 U/L (ref 35–144)
BUN/Creatinine Ratio: 19 (calc) (ref 6–22)
BUN: 31 mg/dL — ABNORMAL HIGH (ref 7–25)
CO2: 26 mmol/L (ref 20–32)
Calcium: 9.6 mg/dL (ref 8.6–10.3)
Chloride: 110 mmol/L (ref 98–110)
Creat: 1.61 mg/dL — ABNORMAL HIGH (ref 0.70–1.11)
Globulin: 2.7 g/dL (calc) (ref 1.9–3.7)
Glucose, Bld: 168 mg/dL — ABNORMAL HIGH (ref 65–99)
Potassium: 4.5 mmol/L (ref 3.5–5.3)
Sodium: 142 mmol/L (ref 135–146)
Total Bilirubin: 0.5 mg/dL (ref 0.2–1.2)
Total Protein: 6.6 g/dL (ref 6.1–8.1)

## 2019-04-23 DIAGNOSIS — E559 Vitamin D deficiency, unspecified: Secondary | ICD-10-CM | POA: Diagnosis not present

## 2019-04-23 DIAGNOSIS — D649 Anemia, unspecified: Secondary | ICD-10-CM | POA: Diagnosis not present

## 2019-04-23 DIAGNOSIS — I10 Essential (primary) hypertension: Secondary | ICD-10-CM | POA: Diagnosis not present

## 2019-04-23 DIAGNOSIS — R809 Proteinuria, unspecified: Secondary | ICD-10-CM | POA: Diagnosis not present

## 2019-04-23 DIAGNOSIS — N183 Chronic kidney disease, stage 3 unspecified: Secondary | ICD-10-CM | POA: Diagnosis not present

## 2019-04-23 DIAGNOSIS — Z79899 Other long term (current) drug therapy: Secondary | ICD-10-CM | POA: Diagnosis not present

## 2019-04-24 ENCOUNTER — Ambulatory Visit: Payer: Medicare Other | Admitting: "Endocrinology

## 2019-04-24 ENCOUNTER — Other Ambulatory Visit: Payer: Self-pay

## 2019-04-25 DIAGNOSIS — I5033 Acute on chronic diastolic (congestive) heart failure: Secondary | ICD-10-CM | POA: Diagnosis not present

## 2019-04-25 DIAGNOSIS — D631 Anemia in chronic kidney disease: Secondary | ICD-10-CM | POA: Diagnosis not present

## 2019-04-25 DIAGNOSIS — E1122 Type 2 diabetes mellitus with diabetic chronic kidney disease: Secondary | ICD-10-CM | POA: Diagnosis not present

## 2019-04-25 DIAGNOSIS — E211 Secondary hyperparathyroidism, not elsewhere classified: Secondary | ICD-10-CM | POA: Diagnosis not present

## 2019-04-25 DIAGNOSIS — E559 Vitamin D deficiency, unspecified: Secondary | ICD-10-CM | POA: Diagnosis not present

## 2019-04-25 DIAGNOSIS — N189 Chronic kidney disease, unspecified: Secondary | ICD-10-CM | POA: Diagnosis not present

## 2019-04-25 DIAGNOSIS — R809 Proteinuria, unspecified: Secondary | ICD-10-CM | POA: Diagnosis not present

## 2019-04-25 DIAGNOSIS — E1129 Type 2 diabetes mellitus with other diabetic kidney complication: Secondary | ICD-10-CM | POA: Diagnosis not present

## 2019-05-03 DIAGNOSIS — E119 Type 2 diabetes mellitus without complications: Secondary | ICD-10-CM | POA: Diagnosis not present

## 2019-05-03 DIAGNOSIS — E785 Hyperlipidemia, unspecified: Secondary | ICD-10-CM | POA: Diagnosis not present

## 2019-05-07 DIAGNOSIS — Z79899 Other long term (current) drug therapy: Secondary | ICD-10-CM | POA: Diagnosis not present

## 2019-05-07 DIAGNOSIS — E559 Vitamin D deficiency, unspecified: Secondary | ICD-10-CM | POA: Diagnosis not present

## 2019-05-07 DIAGNOSIS — I10 Essential (primary) hypertension: Secondary | ICD-10-CM | POA: Diagnosis not present

## 2019-05-07 DIAGNOSIS — D649 Anemia, unspecified: Secondary | ICD-10-CM | POA: Diagnosis not present

## 2019-05-07 DIAGNOSIS — R809 Proteinuria, unspecified: Secondary | ICD-10-CM | POA: Diagnosis not present

## 2019-05-07 DIAGNOSIS — N184 Chronic kidney disease, stage 4 (severe): Secondary | ICD-10-CM | POA: Diagnosis not present

## 2019-05-09 DIAGNOSIS — E1122 Type 2 diabetes mellitus with diabetic chronic kidney disease: Secondary | ICD-10-CM | POA: Diagnosis not present

## 2019-05-09 DIAGNOSIS — N189 Chronic kidney disease, unspecified: Secondary | ICD-10-CM | POA: Diagnosis not present

## 2019-05-09 DIAGNOSIS — E870 Hyperosmolality and hypernatremia: Secondary | ICD-10-CM | POA: Diagnosis not present

## 2019-05-09 DIAGNOSIS — E1129 Type 2 diabetes mellitus with other diabetic kidney complication: Secondary | ICD-10-CM | POA: Diagnosis not present

## 2019-05-09 DIAGNOSIS — E559 Vitamin D deficiency, unspecified: Secondary | ICD-10-CM | POA: Diagnosis not present

## 2019-05-09 DIAGNOSIS — D631 Anemia in chronic kidney disease: Secondary | ICD-10-CM | POA: Diagnosis not present

## 2019-05-09 DIAGNOSIS — R809 Proteinuria, unspecified: Secondary | ICD-10-CM | POA: Diagnosis not present

## 2019-05-09 DIAGNOSIS — E211 Secondary hyperparathyroidism, not elsewhere classified: Secondary | ICD-10-CM | POA: Diagnosis not present

## 2019-05-16 DIAGNOSIS — I10 Essential (primary) hypertension: Secondary | ICD-10-CM | POA: Diagnosis not present

## 2019-05-16 DIAGNOSIS — E1165 Type 2 diabetes mellitus with hyperglycemia: Secondary | ICD-10-CM | POA: Diagnosis not present

## 2019-05-16 DIAGNOSIS — N183 Chronic kidney disease, stage 3 unspecified: Secondary | ICD-10-CM | POA: Diagnosis not present

## 2019-06-12 DIAGNOSIS — Z79899 Other long term (current) drug therapy: Secondary | ICD-10-CM | POA: Diagnosis not present

## 2019-06-12 DIAGNOSIS — R809 Proteinuria, unspecified: Secondary | ICD-10-CM | POA: Diagnosis not present

## 2019-06-12 DIAGNOSIS — E559 Vitamin D deficiency, unspecified: Secondary | ICD-10-CM | POA: Diagnosis not present

## 2019-06-12 DIAGNOSIS — D631 Anemia in chronic kidney disease: Secondary | ICD-10-CM | POA: Diagnosis not present

## 2019-06-12 DIAGNOSIS — N1831 Chronic kidney disease, stage 3a: Secondary | ICD-10-CM | POA: Diagnosis not present

## 2019-06-13 DIAGNOSIS — I5033 Acute on chronic diastolic (congestive) heart failure: Secondary | ICD-10-CM | POA: Diagnosis not present

## 2019-06-13 DIAGNOSIS — E211 Secondary hyperparathyroidism, not elsewhere classified: Secondary | ICD-10-CM | POA: Diagnosis not present

## 2019-06-13 DIAGNOSIS — D631 Anemia in chronic kidney disease: Secondary | ICD-10-CM | POA: Diagnosis not present

## 2019-06-13 DIAGNOSIS — E559 Vitamin D deficiency, unspecified: Secondary | ICD-10-CM | POA: Diagnosis not present

## 2019-06-13 DIAGNOSIS — R809 Proteinuria, unspecified: Secondary | ICD-10-CM | POA: Diagnosis not present

## 2019-06-13 DIAGNOSIS — E1129 Type 2 diabetes mellitus with other diabetic kidney complication: Secondary | ICD-10-CM | POA: Diagnosis not present

## 2019-06-13 DIAGNOSIS — E1122 Type 2 diabetes mellitus with diabetic chronic kidney disease: Secondary | ICD-10-CM | POA: Diagnosis not present

## 2019-06-13 DIAGNOSIS — N189 Chronic kidney disease, unspecified: Secondary | ICD-10-CM | POA: Diagnosis not present

## 2019-06-15 DIAGNOSIS — B351 Tinea unguium: Secondary | ICD-10-CM | POA: Diagnosis not present

## 2019-06-15 DIAGNOSIS — E1142 Type 2 diabetes mellitus with diabetic polyneuropathy: Secondary | ICD-10-CM | POA: Diagnosis not present

## 2019-06-26 ENCOUNTER — Ambulatory Visit (INDEPENDENT_AMBULATORY_CARE_PROVIDER_SITE_OTHER): Payer: Medicare Other | Admitting: Internal Medicine

## 2019-06-26 ENCOUNTER — Encounter: Payer: Self-pay | Admitting: *Deleted

## 2019-06-26 ENCOUNTER — Encounter: Payer: Self-pay | Admitting: Internal Medicine

## 2019-06-26 VITALS — BP 125/63 | HR 100 | Temp 96.6°F | Ht 72.0 in | Wt 203.0 lb

## 2019-06-26 DIAGNOSIS — Z01818 Encounter for other preprocedural examination: Secondary | ICD-10-CM

## 2019-06-26 NOTE — Patient Instructions (Signed)
Medication Instructions:  Your physician recommends that you continue on your current medications as directed. Please refer to the Current Medication list given to you today.  *If you need a refill on your cardiac medications before your next appointment, please call your pharmacy*  Lab Work: Your physician recommends that you return for lab work in: 06/29/19   If you have labs (blood work) drawn today and your tests are completely normal, you will receive your results only by: Marland Kitchen MyChart Message (if you have MyChart) OR . A paper copy in the mail If you have any lab test that is abnormal or we need to change your treatment, we will call you to review the results.  Testing/Procedures: Your physician has recommended that you have a pacemaker inserted. A pacemaker is a small device that is placed under the skin of your chest or abdomen to help control abnormal heart rhythms. This device uses electrical pulses to prompt the heart to beat at a normal rate. Pacemakers are used to treat heart rhythms that are too slow. Wire (leads) are attached to the pacemaker that goes into the chambers of you heart. This is done in the hospital and usually requires and overnight stay. Please see the instruction sheet given to you today for more information.    Follow-Up: At [facility], you and your health needs are our priority.  As part of our continuing mission to provide you with exceptional heart care, we have created designated Provider Care Teams.  These Care Teams include your primary Cardiologist (physician) and Advanced Practice Providers (APPs -  Physician Assistants and Nurse Practitioners) who all work together to provide you with the care you need, when you need it.  Your next appointment:    After Change out   The format for your next appointment:   In Person  Provider:   Cristopher Peru, MD  Other Instructions Thank you for choosing [facility]!

## 2019-06-26 NOTE — Progress Notes (Signed)
HPI Mr. Bonifas returns today for followup. He is a pleasant 85 yo man with a h/o CHB, s/p PPM insertion, HTN and diastolic heart failure. He has class 2 dyspnea. He notes peripheral edema. Allergies  Allergen Reactions  . Other     "some kind of fluid pill I can't take"      Current Outpatient Medications  Medication Sig Dispense Refill  . aspirin 81 MG tablet Take 81 mg by mouth every evening.     . docusate sodium (COLACE) 100 MG capsule Take 100 mg by mouth daily as needed for mild constipation.    . fenofibrate 160 MG tablet Take 160 mg by mouth every evening.     . furosemide (LASIX) 40 MG tablet daily.    Marland Kitchen glipiZIDE (GLUCOTROL) 5 MG tablet Take 1 tablet (5 mg total) by mouth 2 (two) times daily with a meal. 60 tablet 3  . glucose blood test strip 1 each by Other route 2 (two) times daily. Use as instructed 2 x daily. E11.65 EZ MAX 100 each 5  . linagliptin (TRADJENTA) 5 MG TABS tablet Take 5 mg by mouth daily.    Marland Kitchen losartan (COZAAR) 100 MG tablet Take 100 mg by mouth daily.     . simvastatin (ZOCOR) 20 MG tablet daily.     No current facility-administered medications for this visit.     Past Medical History:  Diagnosis Date  . CKD (chronic kidney disease), stage I   . Diabetes mellitus   . First degree AV block   . Hyperlipidemia   . Hypertension   . Second degree AV block   . Sinus bradycardia     ROS:   All systems reviewed and negative except as noted in the HPI.   Past Surgical History:  Procedure Laterality Date  . COLONOSCOPY  2010   Two 4 mm sessile hepatic flexure polyps, one slightly pedunculated 6 to 8 mm sessile descending colon polyp, small internal hemorrhoids. Simple adenomas.   . colonscopy    . HEMORRHOID SURGERY    . PACEMAKER INSERTION  2010   [facility]  . UMBILICAL HERNIA REPAIR  04/2008     Family History  Problem Relation Age of Onset  . Heart disease Father   . Cancer Sister   . Cancer Sister   . Cancer Sister   . ALS  Sister   . Colon cancer Neg Hx      Social History   Socioeconomic History  . Marital status: Widowed    Spouse name: Not on file  . Number of children: Not on file  . Years of education: Not on file  . Highest education level: Not on file  Occupational History  . Occupation: Retired, Optometrist Tobacco  Tobacco Use  . Smoking status: Never Smoker  . Smokeless tobacco: Never Used  Substance and Sexual Activity  . Alcohol use: No    Alcohol/week: 0.0 standard drinks  . Drug use: No  . Sexual activity: Never    Birth control/protection: Abstinence  Other Topics Concern  . Not on file  Social History Narrative   Widowed   1 child   Walks daily   Social Determinants of Health   Financial Resource Strain:   . Difficulty of Paying Living Expenses: Not on file  Food Insecurity:   . Worried About Charity fundraiser in the Last Year: Not on file  . Ran Out of Food in the Last Year: Not on  file  Transportation Needs:   . Film/video editor (Medical): Not on file  . Lack of Transportation (Non-Medical): Not on file  Physical Activity:   . Days of Exercise per Week: Not on file  . Minutes of Exercise per Session: Not on file  Stress:   . Feeling of Stress : Not on file  Social Connections:   . Frequency of Communication with Friends and Family: Not on file  . Frequency of Social Gatherings with Friends and Family: Not on file  . Attends Religious Services: Not on file  . Active Member of Clubs or Organizations: Not on file  . Attends Archivist Meetings: Not on file  . Marital Status: Not on file  Intimate Partner Violence:   . Fear of Current or Ex-Partner: Not on file  . Emotionally Abused: Not on file  . Physically Abused: Not on file  . Sexually Abused: Not on file     BP 125/63   Pulse 100   Temp (!) 96.6 F (35.9 C)   Ht 6' (1.829 m)   Wt 203 lb (92.1 kg)   SpO2 99%   BMI 27.53 kg/m   Physical Exam:  Well appearing NAD HEENT:  Unremarkable Neck:  No JVD, no thyromegally Lymphatics:  No adenopathy Back:  No CVA tenderness Lungs:  Clear with no wheezes HEART:  Regular rate rhythm, no murmurs, no rubs, no clicks Abd:  soft, positive bowel sounds, no organomegally, no rebound, no guarding Ext:  2 plus pulses, no edema, no cyanosis, no clubbing Skin:  No rashes no nodules Neuro:  CN II through XII intact, motor grossly intact   DEVICE  Normal device function.  See PaceArt for details. ERI  Assess/Plan: 1. Atrial fib - this is a new problem. He has been out of rhythm for a month. After his PPM gen change out, we will plan to start systemic anti-coagulation. As he has renal insufficiency, I would likely imagine warfarin. 2. PPM - his [employer] DDD PM is at [facility]. He will undergo gen change out. 3. HTN - his bp is well controlled. We will recheck in several months. 4. Atrial fib - he has developed this and been out of rhythm for a month. He will be anti-coagulated after undergoing PM gen change.  Mikle Bosworth.D.

## 2019-06-29 ENCOUNTER — Other Ambulatory Visit (HOSPITAL_COMMUNITY)
Admission: RE | Admit: 2019-06-29 | Discharge: 2019-06-29 | Disposition: A | Discharge status: Home / Self Care | Payer: Medicare Other | Source: Ambulatory Visit | Attending: Internal Medicine | Admitting: Internal Medicine

## 2019-06-29 ENCOUNTER — Other Ambulatory Visit: Payer: Self-pay

## 2019-06-29 DIAGNOSIS — Z20828 Contact with and (suspected) exposure to other viral communicable diseases: Secondary | ICD-10-CM | POA: Insufficient documentation

## 2019-06-29 DIAGNOSIS — Z01812 Encounter for preprocedural laboratory examination: Secondary | ICD-10-CM | POA: Insufficient documentation

## 2019-06-29 LAB — CBC
HCT: 36.4 % — ABNORMAL LOW (ref 39.0–52.0)
Hemoglobin: 11.4 g/dL — ABNORMAL LOW (ref 13.0–17.0)
MCH: 25.3 pg — ABNORMAL LOW (ref 26.0–34.0)
MCHC: 31.3 g/dL (ref 30.0–36.0)
MCV: 80.7 fL (ref 80.0–100.0)
Platelets: 212 10*3/uL (ref 150–400)
RBC: 4.51 MIL/uL (ref 4.22–5.81)
RDW: 14.7 % (ref 11.5–15.5)
WBC: 4.8 10*3/uL (ref 4.0–10.5)
nRBC: 0 % (ref 0.0–0.2)

## 2019-06-29 LAB — BASIC METABOLIC PANEL
Anion gap: 11 (ref 5–15)
BUN: 77 mg/dL — ABNORMAL HIGH (ref 8–23)
CO2: 25 mmol/L (ref 22–32)
Calcium: 9.6 mg/dL (ref 8.9–10.3)
Chloride: 102 mmol/L (ref 98–111)
Creatinine, Ser: 3 mg/dL — ABNORMAL HIGH (ref 0.61–1.24)
GFR calc Af Amer: 21 mL/min — ABNORMAL LOW (ref >60)
GFR calc non Af Amer: 18 mL/min — ABNORMAL LOW (ref >60)
Glucose, Bld: 155 mg/dL — ABNORMAL HIGH (ref 70–99)
Potassium: 4.2 mmol/L (ref 3.5–5.1)
Sodium: 138 mmol/L (ref 135–145)

## 2019-06-29 LAB — SARS CORONAVIRUS 2 (TAT 6-24 HRS): SARS Coronavirus 2: NEGATIVE

## 2019-07-04 ENCOUNTER — Ambulatory Visit (HOSPITAL_COMMUNITY): Admission: RE | Admit: 2019-07-04 | Payer: Medicare Other | Source: Ambulatory Visit | Admitting: Internal Medicine

## 2019-07-04 SURGERY — PPM GENERATOR CHANGEOUT

## 2019-07-05 ENCOUNTER — Other Ambulatory Visit: Payer: Self-pay

## 2019-07-05 ENCOUNTER — Encounter (HOSPITAL_COMMUNITY): Payer: Self-pay | Admitting: *Deleted

## 2019-07-05 ENCOUNTER — Emergency Department (HOSPITAL_COMMUNITY)
Admission: EM | Admit: 2019-07-05 | Discharge: 2019-07-05 | Disposition: A | Discharge status: Home / Self Care | Payer: Medicare Other | Attending: Emergency Medicine | Admitting: Emergency Medicine

## 2019-07-05 DIAGNOSIS — Z7984 Long term (current) use of oral hypoglycemic drugs: Secondary | ICD-10-CM | POA: Diagnosis not present

## 2019-07-05 DIAGNOSIS — I129 Hypertensive chronic kidney disease with stage 1 through stage 4 chronic kidney disease, or unspecified chronic kidney disease: Secondary | ICD-10-CM | POA: Insufficient documentation

## 2019-07-05 DIAGNOSIS — Z79899 Other long term (current) drug therapy: Secondary | ICD-10-CM | POA: Diagnosis not present

## 2019-07-05 DIAGNOSIS — H6123 Impacted cerumen, bilateral: Secondary | ICD-10-CM | POA: Diagnosis not present

## 2019-07-05 DIAGNOSIS — N183 Chronic kidney disease, stage 3 unspecified: Secondary | ICD-10-CM | POA: Insufficient documentation

## 2019-07-05 DIAGNOSIS — E1122 Type 2 diabetes mellitus with diabetic chronic kidney disease: Secondary | ICD-10-CM | POA: Insufficient documentation

## 2019-07-05 DIAGNOSIS — H9313 Tinnitus, bilateral: Secondary | ICD-10-CM | POA: Diagnosis present

## 2019-07-05 MED ORDER — CARBAMIDE PEROXIDE 6.5 % OT SOLN: 5.0000 [drp] | Freq: Two times a day (BID) | OTIC | 0 refills | Status: DC

## 2019-07-05 NOTE — ED Notes (Signed)
Both ears irrigated.  Wax removed, left greater than right.  Pt tolerated well. PA notified

## 2019-07-05 NOTE — Discharge Instructions (Signed)
Please use debrox to help remove ear wax as it may contribute to the static sounds that you are hearing.  Follow up with your doctor for further care.  Avoid Qtip use.  Return if your condition worsen or if you have other concerns.

## 2019-07-05 NOTE — ED Triage Notes (Signed)
Feeling like he has a static in left ear and sometime is goes into right ear for the past 4 days

## 2019-07-05 NOTE — ED Provider Notes (Addendum)
[facility] EMERGENCY DEPARTMENT Provider Note   CSN: [id number] Arrival date & time: 07/05/19  1333     History Chief Complaint  Patient presents with  . Tinnitus    Kyle Schultz is a 85 y.o. male.  The history is provided by the patient. No language interpreter was used.       85 year old male with history of diabetes, CKD, hypertension, hyperlipidemia, presenting with ringing in his ears.  Patient report for the past week he has noticed some static he sounds to both of his ears left greater than right.  It happened sporadically.  No associated pain with that.  No fever, headache, jaw pain, trouble swallowing, or sore throat.  He does use Q-tips and report noticing some wax buildup on the Q-tip.  He denies any recent medication changes.  He denies any drainage coming from the ear.  He denies loss of hearing.  No dizziness or lightheadedness  Past Medical History:  Diagnosis Date  . CKD (chronic kidney disease), stage I   . Diabetes mellitus   . First degree AV block   . Hyperlipidemia   . Hypertension   . Second degree AV block   . Sinus bradycardia     Patient Active Problem List   Diagnosis Date Noted  . History of colonic polyps 03/20/2019  . Mixed hyperlipidemia 11/03/2017  . Uncontrolled type 2 diabetes mellitus with stage 3 chronic kidney disease ([place]) 08/11/2017  . Type 2 diabetes mellitus with other circulatory complications ([place]) [id number]  . Leukopenia 03/31/2017  . Chest pain 03/31/2017  . SIRS (systemic inflammatory response syndrome) ([place]) 05/31/2016  . Fever 05/31/2016  . Chronic renal insufficiency, stage III (moderate) 06/26/2015  . Palpitations 06/26/2015  . SINOATRIAL NODE DYSFUNCTION 08/01/2009  . Cardiac pacemaker in situ 08/01/2009  . Dyslipidemia 03/10/2009  . Essential hypertension, benign 03/10/2009    Past Surgical History:  Procedure Laterality Date  . COLONOSCOPY  2010   Two 4 mm sessile hepatic flexure polyps, one slightly  pedunculated 6 to 8 mm sessile descending colon polyp, small internal hemorrhoids. Simple adenomas.   . colonscopy    . HEMORRHOID SURGERY    . PACEMAKER INSERTION  2010   [facility]  . UMBILICAL HERNIA REPAIR  04/2008       Family History  Problem Relation Age of Onset  . Heart disease Father   . Cancer Sister   . Cancer Sister   . Cancer Sister   . ALS Sister   . Colon cancer Neg Hx     Social History   Tobacco Use  . Smoking status: Never Smoker  . Smokeless tobacco: Never Used  Substance Use Topics  . Alcohol use: No    Alcohol/week: 0.0 standard drinks  . Drug use: No    Home Medications Prior to Admission medications   Medication Sig Start Date End Date Taking? Authorizing Provider  aspirin 81 MG tablet Take 81 mg by mouth every evening.     [provider]  docusate sodium (COLACE) 100 MG capsule Take 100 mg by mouth daily as needed for mild constipation.    [provider]  fenofibrate 160 MG tablet Take 160 mg by mouth every evening.     [provider]  furosemide (LASIX) 40 MG tablet daily. 01/03/18   [provider]  glipiZIDE (GLUCOTROL) 5 MG tablet Take 1 tablet (5 mg total) by mouth 2 (two) times daily with a meal. 07/10/18   Nida, Marella Chimes, MD  glucose blood test strip 1 each by Other route 2 (two) times daily. Use as instructed 2 x daily. E11.65 EZ MAX 05/01/18   Cassandria Anger, MD  linagliptin (TRADJENTA) 5 MG TABS tablet Take 5 mg by mouth daily.    [provider]  losartan (COZAAR) 100 MG tablet Take 100 mg by mouth daily.  02/13/11   [provider]  simvastatin (ZOCOR) 20 MG tablet daily. 12/25/17   [provider]    Allergies    Other  Review of Systems   Review of Systems  Constitutional: Negative for fever.  HENT: Positive for tinnitus. Negative for ear discharge, ear pain and hearing loss.   Neurological: Negative for headaches.    Physical Exam Updated Vital  Signs BP 116/62   Pulse 74   Temp (!) 97.2 F (36.2 C) (Oral)   Resp 18   Wt 88.9 kg   SpO2 99%   BMI 26.58 kg/m   Physical Exam Vitals and nursing note reviewed.  Constitutional:      General: He is not in acute distress.    Appearance: He is well-developed.  HENT:     Head: Atraumatic.     Ears:     Comments: Ears: Cerumen impaction noted to both ear canal.  Ear canal is not erythematous or edematous.  TM is obscured by cerumen impaction.  Earlobes are nontender. Eyes:     Conjunctiva/sclera: Conjunctivae normal.  Musculoskeletal:     Cervical back: Neck supple.  Skin:    Findings: No rash.  Neurological:     Mental Status: He is alert.     ED Results / Procedures / Treatments   Labs (all labs ordered are listed, but only abnormal results are displayed) Labs Reviewed - No data to display  EKG None  Radiology No results found.  Procedures .Ear Cerumen Removal  Date/Time: 07/18/2019 9:18 PM Performed by: Domenic Moras, PA-C Authorized by: Domenic Moras, PA-C   Consent:    Consent obtained:  Verbal   Consent given by:  Patient   Risks discussed:  Bleeding and dizziness   Alternatives discussed:  No treatment Procedure details:    Location:  L ear and R ear   Procedure type: irrigation   Post-procedure details:    Inspection:  TM intact and macerated skin   Hearing quality:  Improved   Patient tolerance of procedure:  Tolerated well, no immediate complications Comments:     Procedure performed by patient's nurse   (including critical care time)  Medications Ordered in ED Medications - No data to display  ED Course  I have reviewed the triage vital signs and the nursing notes.  Pertinent labs & imaging results that were available during my care of the patient were reviewed by me and considered in my medical decision making (see chart for details).    MDM Rules/Calculators/A&P                      BP 116/62   Pulse 74   Temp (!) 97.2 F (36.2 C)  (Oral)   Resp 18   Wt 88.9 kg   SpO2 99%   BMI 26.58 kg/m   Final Clinical Impression(s) / ED Diagnoses Final diagnoses:  Bilateral impacted cerumen    Rx / DC Orders ED Discharge Orders         Ordered    carbamide peroxide (DEBROX) 6.5 % OTIC solution  2 times daily     07/05/19  40         2:08 PM Patient report static he sounds to his ears sporadically for the past week.  He also noticed increased wax buildup.  On exam patient does have evidence of cerumen impaction likely causing the sounds.  Low suspicion for ear infection, low suspicion for true tinnitus.  I was able to visualize some aspects of TMs bilaterally which have normal appearance.  Will irrigate ears.   2:59 PM Pt report improvement of sxs after ear irrigation. Cerumen in R ear was mostly removed, there's still some impacted cerumen in L ear.  Pt agrees to f/u with PCP for recheck. Pt d/c with debrox, recommend avoid Qtip use.  I discussed care with DR. Miller. I have low suspicion of tinnitus from medication or stroke, or acute kidney toxicity or ototoxicity.     Domenic Moras, PA-C 07/05/19 1511    Noemi Chapel, MD 07/05/19 1532  ADDENDUM: procedure documented per request.   Domenic Moras, PA-C 07/18/19 2119    Noemi Chapel, MD 07/23/19 [phone]

## 2019-07-16 DIAGNOSIS — E785 Hyperlipidemia, unspecified: Secondary | ICD-10-CM | POA: Diagnosis not present

## 2019-07-16 DIAGNOSIS — E119 Type 2 diabetes mellitus without complications: Secondary | ICD-10-CM | POA: Diagnosis not present

## 2019-07-18 DIAGNOSIS — E1122 Type 2 diabetes mellitus with diabetic chronic kidney disease: Secondary | ICD-10-CM | POA: Diagnosis not present

## 2019-07-18 DIAGNOSIS — D631 Anemia in chronic kidney disease: Secondary | ICD-10-CM | POA: Diagnosis not present

## 2019-07-18 DIAGNOSIS — N170 Acute kidney failure with tubular necrosis: Secondary | ICD-10-CM | POA: Diagnosis not present

## 2019-07-18 DIAGNOSIS — E211 Secondary hyperparathyroidism, not elsewhere classified: Secondary | ICD-10-CM | POA: Diagnosis not present

## 2019-07-18 DIAGNOSIS — R809 Proteinuria, unspecified: Secondary | ICD-10-CM | POA: Diagnosis not present

## 2019-07-18 DIAGNOSIS — E559 Vitamin D deficiency, unspecified: Secondary | ICD-10-CM | POA: Diagnosis not present

## 2019-07-18 DIAGNOSIS — N189 Chronic kidney disease, unspecified: Secondary | ICD-10-CM | POA: Diagnosis not present

## 2019-07-18 DIAGNOSIS — E1129 Type 2 diabetes mellitus with other diabetic kidney complication: Secondary | ICD-10-CM | POA: Diagnosis not present

## 2019-07-18 DIAGNOSIS — E870 Hyperosmolality and hypernatremia: Secondary | ICD-10-CM | POA: Diagnosis not present

## 2019-07-25 DIAGNOSIS — N170 Acute kidney failure with tubular necrosis: Secondary | ICD-10-CM | POA: Diagnosis not present

## 2019-07-25 DIAGNOSIS — E870 Hyperosmolality and hypernatremia: Secondary | ICD-10-CM | POA: Diagnosis not present

## 2019-07-25 DIAGNOSIS — E211 Secondary hyperparathyroidism, not elsewhere classified: Secondary | ICD-10-CM | POA: Diagnosis not present

## 2019-07-25 DIAGNOSIS — E559 Vitamin D deficiency, unspecified: Secondary | ICD-10-CM | POA: Diagnosis not present

## 2019-07-25 DIAGNOSIS — D631 Anemia in chronic kidney disease: Secondary | ICD-10-CM | POA: Diagnosis not present

## 2019-07-25 DIAGNOSIS — N189 Chronic kidney disease, unspecified: Secondary | ICD-10-CM | POA: Diagnosis not present

## 2019-07-25 DIAGNOSIS — E1129 Type 2 diabetes mellitus with other diabetic kidney complication: Secondary | ICD-10-CM | POA: Diagnosis not present

## 2019-07-25 DIAGNOSIS — R809 Proteinuria, unspecified: Secondary | ICD-10-CM | POA: Diagnosis not present

## 2019-07-25 DIAGNOSIS — E1122 Type 2 diabetes mellitus with diabetic chronic kidney disease: Secondary | ICD-10-CM | POA: Diagnosis not present

## 2019-07-27 DIAGNOSIS — R809 Proteinuria, unspecified: Secondary | ICD-10-CM | POA: Diagnosis not present

## 2019-07-27 DIAGNOSIS — E1122 Type 2 diabetes mellitus with diabetic chronic kidney disease: Secondary | ICD-10-CM | POA: Diagnosis not present

## 2019-07-27 DIAGNOSIS — N189 Chronic kidney disease, unspecified: Secondary | ICD-10-CM | POA: Diagnosis not present

## 2019-07-27 DIAGNOSIS — E211 Secondary hyperparathyroidism, not elsewhere classified: Secondary | ICD-10-CM | POA: Diagnosis not present

## 2019-07-27 DIAGNOSIS — D631 Anemia in chronic kidney disease: Secondary | ICD-10-CM | POA: Diagnosis not present

## 2019-07-27 DIAGNOSIS — E1129 Type 2 diabetes mellitus with other diabetic kidney complication: Secondary | ICD-10-CM | POA: Diagnosis not present

## 2019-07-27 DIAGNOSIS — N170 Acute kidney failure with tubular necrosis: Secondary | ICD-10-CM | POA: Diagnosis not present

## 2019-08-13 DIAGNOSIS — N170 Acute kidney failure with tubular necrosis: Secondary | ICD-10-CM | POA: Diagnosis not present

## 2019-08-15 DIAGNOSIS — D631 Anemia in chronic kidney disease: Secondary | ICD-10-CM | POA: Diagnosis not present

## 2019-08-15 DIAGNOSIS — N189 Chronic kidney disease, unspecified: Secondary | ICD-10-CM | POA: Diagnosis not present

## 2019-08-15 DIAGNOSIS — E1122 Type 2 diabetes mellitus with diabetic chronic kidney disease: Secondary | ICD-10-CM | POA: Diagnosis not present

## 2019-08-15 DIAGNOSIS — R809 Proteinuria, unspecified: Secondary | ICD-10-CM | POA: Diagnosis not present

## 2019-08-15 DIAGNOSIS — E1129 Type 2 diabetes mellitus with other diabetic kidney complication: Secondary | ICD-10-CM | POA: Diagnosis not present

## 2019-08-15 DIAGNOSIS — E211 Secondary hyperparathyroidism, not elsewhere classified: Secondary | ICD-10-CM | POA: Diagnosis not present

## 2019-08-15 DIAGNOSIS — N170 Acute kidney failure with tubular necrosis: Secondary | ICD-10-CM | POA: Diagnosis not present

## 2019-08-16 DIAGNOSIS — E785 Hyperlipidemia, unspecified: Secondary | ICD-10-CM | POA: Diagnosis not present

## 2019-08-16 DIAGNOSIS — I10 Essential (primary) hypertension: Secondary | ICD-10-CM | POA: Diagnosis not present

## 2019-08-24 DIAGNOSIS — B351 Tinea unguium: Secondary | ICD-10-CM | POA: Diagnosis not present

## 2019-08-24 DIAGNOSIS — E1142 Type 2 diabetes mellitus with diabetic polyneuropathy: Secondary | ICD-10-CM | POA: Diagnosis not present

## 2019-08-24 DIAGNOSIS — L851 Acquired keratosis [keratoderma] palmaris et plantaris: Secondary | ICD-10-CM | POA: Diagnosis not present

## 2019-09-02 ENCOUNTER — Ambulatory Visit: Payer: Medicare Other

## 2019-09-06 ENCOUNTER — Ambulatory Visit: Payer: Medicare Other

## 2019-09-13 DIAGNOSIS — I10 Essential (primary) hypertension: Secondary | ICD-10-CM | POA: Diagnosis not present

## 2019-09-13 DIAGNOSIS — I442 Atrioventricular block, complete: Secondary | ICD-10-CM | POA: Diagnosis not present

## 2019-09-20 ENCOUNTER — Encounter: Payer: Self-pay | Admitting: *Deleted

## 2019-09-20 ENCOUNTER — Encounter: Payer: Self-pay | Admitting: Internal Medicine

## 2019-09-20 ENCOUNTER — Ambulatory Visit (INDEPENDENT_AMBULATORY_CARE_PROVIDER_SITE_OTHER): Payer: Medicare Other | Admitting: Internal Medicine

## 2019-09-20 ENCOUNTER — Other Ambulatory Visit: Payer: Self-pay

## 2019-09-20 VITALS — BP 126/64 | HR 72 | Temp 98.3°F | Ht 72.0 in | Wt 205.6 lb

## 2019-09-20 DIAGNOSIS — I442 Atrioventricular block, complete: Secondary | ICD-10-CM | POA: Diagnosis not present

## 2019-09-20 DIAGNOSIS — I10 Essential (primary) hypertension: Secondary | ICD-10-CM

## 2019-09-20 LAB — CBC
HCT: 33.5 % — ABNORMAL LOW (ref 38.5–50.0)
Hemoglobin: 10.6 g/dL — ABNORMAL LOW (ref 13.2–17.1)
MCH: 25.4 pg — ABNORMAL LOW (ref 27.0–33.0)
MCHC: 31.6 g/dL — ABNORMAL LOW (ref 32.0–36.0)
MCV: 80.1 fL (ref 80.0–100.0)
MPV: 11.7 fL (ref 7.5–12.5)
Platelets: 189 10*3/uL (ref 140–400)
RBC: 4.18 10*6/uL — ABNORMAL LOW (ref 4.20–5.80)
RDW: 15.5 % — ABNORMAL HIGH (ref 11.0–15.0)
WBC: 4.6 10*3/uL (ref 3.8–10.8)

## 2019-09-20 LAB — BASIC METABOLIC PANEL
BUN/Creatinine Ratio: 20 (calc) (ref 6–22)
BUN: 43 mg/dL — ABNORMAL HIGH (ref 7–25)
CO2: 28 mmol/L (ref 20–32)
Calcium: 9.7 mg/dL (ref 8.6–10.3)
Chloride: 106 mmol/L (ref 98–110)
Creat: 2.17 mg/dL — ABNORMAL HIGH (ref 0.70–1.11)
Glucose, Bld: 186 mg/dL — ABNORMAL HIGH (ref 65–139)
Potassium: 4.2 mmol/L (ref 3.5–5.3)
Sodium: 141 mmol/L (ref 135–146)

## 2019-09-20 NOTE — Progress Notes (Signed)
HPI [name] returns today for followup. He is a pleasant 86 yo man with a h/o CHB, s/p PPM insertion, HTN and diastolic heart failure. He has class 2 dyspnea. He notes peripheral edema. When I saw him 3 months ago, we scheduled him for a PPM gen change out but he could not get a ride to [place]. He returns today for followup. He was at [facility] in October. Allergies  Allergen Reactions  . Other     "some kind of fluid pill I can't take"      Current Outpatient Medications  Medication Sig Dispense Refill  . aspirin 81 MG tablet Take 81 mg by mouth every evening.     . carbamide peroxide (DEBROX) 6.5 % OTIC solution Place 5 drops into both ears 2 (two) times daily. 15 mL 0  . docusate sodium (COLACE) 100 MG capsule Take 100 mg by mouth daily as needed for mild constipation.    . fenofibrate 160 MG tablet Take 160 mg by mouth every evening.     . furosemide (LASIX) 40 MG tablet daily.    Marland Kitchen glipiZIDE (GLUCOTROL) 5 MG tablet Take 1 tablet (5 mg total) by mouth 2 (two) times daily with a meal. 60 tablet 3  . glucose blood test strip 1 each by Other route 2 (two) times daily. Use as instructed 2 x daily. E11.65 EZ MAX 100 each 5  . linagliptin (TRADJENTA) 5 MG TABS tablet Take 5 mg by mouth daily.    Marland Kitchen losartan (COZAAR) 100 MG tablet Take 100 mg by mouth daily.     . simvastatin (ZOCOR) 20 MG tablet daily.     No current facility-administered medications for this visit.     Past Medical History:  Diagnosis Date  . CKD (chronic kidney disease), stage I   . Diabetes mellitus   . First degree AV block   . Hyperlipidemia   . Hypertension   . Second degree AV block   . Sinus bradycardia     ROS:   All systems reviewed and negative except as noted in the HPI.   Past Surgical History:  Procedure Laterality Date  . COLONOSCOPY  2010   Two 4 mm sessile hepatic flexure polyps, one slightly pedunculated 6 to 8 mm sessile descending colon polyp, small internal hemorrhoids. Simple  adenomas.   . colonscopy    . HEMORRHOID SURGERY    . PACEMAKER INSERTION  2010   [facility]  . UMBILICAL HERNIA REPAIR  04/2008     Family History  Problem Relation Age of Onset  . Heart disease Father   . Cancer Sister   . Cancer Sister   . Cancer Sister   . ALS Sister   . Colon cancer Neg Hx      Social History   Socioeconomic History  . Marital status: Widowed    Spouse name: Not on file  . Number of children: Not on file  . Years of education: Not on file  . Highest education level: Not on file  Occupational History  . Occupation: Retired, Optometrist Tobacco  Tobacco Use  . Smoking status: Never Smoker  . Smokeless tobacco: Never Used  Substance and Sexual Activity  . Alcohol use: No    Alcohol/week: 0.0 standard drinks  . Drug use: No  . Sexual activity: Never    Birth control/protection: Abstinence  Other Topics Concern  . Not on file  Social History Narrative   Widowed   1  child   Walks daily   Social Determinants of Health   Financial Resource Strain:   . Difficulty of Paying Living Expenses:   Food Insecurity:   . Worried About Charity fundraiser in the Last Year:   . Arboriculturist in the Last Year:   Transportation Needs:   . Film/video editor (Medical):   Marland Kitchen Lack of Transportation (Non-Medical):   Physical Activity:   . Days of Exercise per Week:   . Minutes of Exercise per Session:   Stress:   . Feeling of Stress :   Social Connections:   . Frequency of Communication with Friends and Family:   . Frequency of Social Gatherings with Friends and Family:   . Attends Religious Services:   . Active Member of Clubs or Organizations:   . Attends Archivist Meetings:   Marland Kitchen Marital Status:   Intimate Partner Violence:   . Fear of Current or Ex-Partner:   . Emotionally Abused:   Marland Kitchen Physically Abused:   . Sexually Abused:      BP 126/64   Pulse 72   Temp 98.3 F (36.8 C)   Ht 6' (1.829 m)   SpO2 98%   BMI 26.58 kg/m    Physical Exam:  Well appearing NAD HEENT: Unremarkable Neck:  No JVD, no thyromegally Lymphatics:  No adenopathy Back:  No CVA tenderness Lungs:  Clear with no wheezes HEART:  Regular rate rhythm, no murmurs, no rubs, no clicks Abd:  soft, positive bowel sounds, no organomegally, no rebound, no guarding Ext:  2 plus pulses, no edema, no cyanosis, no clubbing Skin:  No rashes no nodules Neuro:  CN II through XII intact, motor grossly intact  DEVICE  Normal device function.  See PaceArt for details.   Assess/Plan: 1. CHB - he is asymptomatic, s/p PPM. He is beyond ERI. He will come down to [facility] next week for PM gen change out. 2. PPM - he has been at [facility] for almost 6 months. He could not get a ride to his change out in December.  3. Atrial fib - my plan was to change out his device and start him on systemic anti-coagulation. 4. HTN - his bp today is well controlled We will follow.  Mikle Bosworth.D.

## 2019-09-20 NOTE — H&P (View-Only) (Signed)
HPI Mr. Krol returns today for followup. He is a pleasant 86 yo man with a h/o CHB, s/p PPM insertion, HTN and diastolic heart failure. He has class 2 dyspnea. He notes peripheral edema. When I saw him 3 months ago, we scheduled him for a PPM gen change out but he could not get a ride to [place]. He returns today for followup. He was at [facility] in October. Allergies  Allergen Reactions  . Other     "some kind of fluid pill I can't take"      Current Outpatient Medications  Medication Sig Dispense Refill  . aspirin 81 MG tablet Take 81 mg by mouth every evening.     . carbamide peroxide (DEBROX) 6.5 % OTIC solution Place 5 drops into both ears 2 (two) times daily. 15 mL 0  . docusate sodium (COLACE) 100 MG capsule Take 100 mg by mouth daily as needed for mild constipation.    . fenofibrate 160 MG tablet Take 160 mg by mouth every evening.     . furosemide (LASIX) 40 MG tablet daily.    Marland Kitchen glipiZIDE (GLUCOTROL) 5 MG tablet Take 1 tablet (5 mg total) by mouth 2 (two) times daily with a meal. 60 tablet 3  . glucose blood test strip 1 each by Other route 2 (two) times daily. Use as instructed 2 x daily. E11.65 EZ MAX 100 each 5  . linagliptin (TRADJENTA) 5 MG TABS tablet Take 5 mg by mouth daily.    Marland Kitchen losartan (COZAAR) 100 MG tablet Take 100 mg by mouth daily.     . simvastatin (ZOCOR) 20 MG tablet daily.     No current facility-administered medications for this visit.     Past Medical History:  Diagnosis Date  . CKD (chronic kidney disease), stage I   . Diabetes mellitus   . First degree AV block   . Hyperlipidemia   . Hypertension   . Second degree AV block   . Sinus bradycardia     ROS:   All systems reviewed and negative except as noted in the HPI.   Past Surgical History:  Procedure Laterality Date  . COLONOSCOPY  2010   Two 4 mm sessile hepatic flexure polyps, one slightly pedunculated 6 to 8 mm sessile descending colon polyp, small internal hemorrhoids. Simple  adenomas.   . colonscopy    . HEMORRHOID SURGERY    . PACEMAKER INSERTION  2010   [facility]  . UMBILICAL HERNIA REPAIR  04/2008     Family History  Problem Relation Age of Onset  . Heart disease Father   . Cancer Sister   . Cancer Sister   . Cancer Sister   . ALS Sister   . Colon cancer Neg Hx      Social History   Socioeconomic History  . Marital status: Widowed    Spouse name: Not on file  . Number of children: Not on file  . Years of education: Not on file  . Highest education level: Not on file  Occupational History  . Occupation: Retired, Optometrist Tobacco  Tobacco Use  . Smoking status: Never Smoker  . Smokeless tobacco: Never Used  Substance and Sexual Activity  . Alcohol use: No    Alcohol/week: 0.0 standard drinks  . Drug use: No  . Sexual activity: Never    Birth control/protection: Abstinence  Other Topics Concern  . Not on file  Social History Narrative   Widowed   1  child   Walks daily   Social Determinants of Health   Financial Resource Strain:   . Difficulty of Paying Living Expenses:   Food Insecurity:   . Worried About Charity fundraiser in the Last Year:   . Arboriculturist in the Last Year:   Transportation Needs:   . Film/video editor (Medical):   Marland Kitchen Lack of Transportation (Non-Medical):   Physical Activity:   . Days of Exercise per Week:   . Minutes of Exercise per Session:   Stress:   . Feeling of Stress :   Social Connections:   . Frequency of Communication with Friends and Family:   . Frequency of Social Gatherings with Friends and Family:   . Attends Religious Services:   . Active Member of Clubs or Organizations:   . Attends Archivist Meetings:   Marland Kitchen Marital Status:   Intimate Partner Violence:   . Fear of Current or Ex-Partner:   . Emotionally Abused:   Marland Kitchen Physically Abused:   . Sexually Abused:      BP 126/64   Pulse 72   Temp 98.3 F (36.8 C)   Ht 6' (1.829 m)   SpO2 98%   BMI 26.58 kg/m    Physical Exam:  Well appearing NAD HEENT: Unremarkable Neck:  No JVD, no thyromegally Lymphatics:  No adenopathy Back:  No CVA tenderness Lungs:  Clear with no wheezes HEART:  Regular rate rhythm, no murmurs, no rubs, no clicks Abd:  soft, positive bowel sounds, no organomegally, no rebound, no guarding Ext:  2 plus pulses, no edema, no cyanosis, no clubbing Skin:  No rashes no nodules Neuro:  CN II through XII intact, motor grossly intact  DEVICE  Normal device function.  See PaceArt for details.   Assess/Plan: 1. CHB - he is asymptomatic, s/p PPM. He is beyond ERI. He will come down to [facility] next week for PM gen change out. 2. PPM - he has been at [facility] for almost 6 months. He could not get a ride to his change out in December.  3. Atrial fib - my plan was to change out his device and start him on systemic anti-coagulation. 4. HTN - his bp today is well controlled We will follow.  Mikle Bosworth.D.

## 2019-09-20 NOTE — Patient Instructions (Signed)
Medication Instructions:  Your physician recommends that you continue on your current medications as directed. Please refer to the Current Medication list given to you today.  *If you need a refill on your cardiac medications before your next appointment, please call your pharmacy*   Lab Work: Your physician recommends that you return for lab work in: Today   If you have labs (blood work) drawn today and your tests are completely normal, you will receive your results only by: Marland Kitchen MyChart Message (if you have MyChart) OR . A paper copy in the mail If you have any lab test that is abnormal or we need to change your treatment, we will call you to review the results.   Testing/Procedures: Your physician has recommended that you have a pacemaker inserted. A pacemaker is a small device that is placed under the skin of your chest or abdomen to help control abnormal heart rhythms. This device uses electrical pulses to prompt the heart to beat at a normal rate. Pacemakers are used to treat heart rhythms that are too slow. Wire (leads) are attached to the pacemaker that goes into the chambers of you heart. This is done in the hospital and usually requires and overnight stay. Please see the instruction sheet given to you today for more information.     Follow-Up: At [facility], you and your health needs are our priority.  As part of our continuing mission to provide you with exceptional heart care, we have created designated Provider Care Teams.  These Care Teams include your primary Cardiologist (physician) and Advanced Practice Providers (APPs -  Physician Assistants and Nurse Practitioners) who all work together to provide you with the care you need, when you need it.  We recommend signing up for the patient portal called "MyChart".  Sign up information is provided on this After Visit Summary.  MyChart is used to connect with patients for Virtual Visits (Telemedicine).  Patients are able to view  lab/test results, encounter notes, upcoming appointments, etc.  Non-urgent messages can be sent to your provider as well.   To learn more about what you can do with MyChart, go to [email or web address].    Your next appointment:    91 Days    The format for your next appointment:   In Person  Provider:   Cristopher Peru, MD   Other Instructions Thank you for choosing [facility]!

## 2019-09-21 ENCOUNTER — Other Ambulatory Visit (HOSPITAL_COMMUNITY)
Admission: RE | Admit: 2019-09-21 | Discharge: 2019-09-21 | Disposition: A | Discharge status: Home / Self Care | Payer: Medicare Other | Source: Ambulatory Visit | Attending: Internal Medicine | Admitting: Internal Medicine

## 2019-09-21 DIAGNOSIS — Z20822 Contact with and (suspected) exposure to covid-19: Secondary | ICD-10-CM | POA: Diagnosis not present

## 2019-09-21 DIAGNOSIS — Z01812 Encounter for preprocedural laboratory examination: Secondary | ICD-10-CM | POA: Insufficient documentation

## 2019-09-22 DIAGNOSIS — Z23 Encounter for immunization: Secondary | ICD-10-CM | POA: Diagnosis not present

## 2019-09-22 LAB — SARS CORONAVIRUS 2 (TAT 6-24 HRS): SARS Coronavirus 2: NEGATIVE

## 2019-09-25 ENCOUNTER — Telehealth: Payer: Self-pay | Admitting: Internal Medicine

## 2019-09-25 NOTE — Telephone Encounter (Signed)
   Kyle Schultz DOB: March 12, 1930 MRN: [id number]   RIDER WAIVER AND RELEASE OF LIABILITY  For purposes of improving physical access to our facilities, [place] is pleased to partner with third parties to provide Kyle Schultz patients or other authorized individuals the option of convenient, on-demand ground transportation services (the Technical brewer") through use of the technology service that enables users to request on-demand ground transportation from independent third-party providers.  By opting to use and accept these [employer], I, the undersigned, hereby agree on behalf of myself, and on behalf of any minor child using the [employer] for whom I am the parent or legal guardian, as follows:  1. Government social research officer provided to me are provided by independent third-party transportation providers who are not [employer] or employees and who are unaffiliated with [employer]. 2. [place] is neither a transportation carrier nor a common or public carrier. 3. [facility] has no control over the quality or safety of the transportation that occurs as a result of the [employer]. 4. [facility] cannot guarantee that any third-party transportation provider will complete any arranged transportation service. 5. [facility] makes no representation, warranty, or guarantee regarding the reliability, timeliness, quality, safety, suitability, or availability of any of the Transport Services or that they will be error free. 6. I fully understand that traveling by vehicle involves risks and dangers of serious bodily injury, including permanent disability, paralysis, and death. I agree, on behalf of myself and on behalf of any minor child using the Transport Services for whom I am the parent or legal guardian, that the entire risk arising out of my use of the [employer] remains solely with me, to the maximum extent permitted under applicable law. 7. The [employer]  are provided "as is" and "as available." [place] disclaims all representations and warranties, express, implied or statutory, not expressly set out in these terms, including the implied warranties of merchantability and fitness for a particular purpose. 8. I hereby waive and release [place], its agents, employees, officers, directors, representatives, insurers, attorneys, assigns, successors, subsidiaries, and affiliates from any and all past, present, or future claims, demands, liabilities, actions, causes of action, or suits of any kind directly or indirectly arising from acceptance and use of the [employer]. 9. I further waive and release [place] and its affiliates from all present and future liability and responsibility for any injury or death to persons or damages to property caused by or related to the use of the [employer]. 10. I have read this Waiver and Release of Liability, and I understand the terms used in it and their legal significance. This Waiver is freely and voluntarily given with the understanding that my right (as well as the right of any minor child for whom I am the parent or legal guardian using the [employer]) to legal recourse against [place] in connection with the [employer] is knowingly surrendered in return for use of these services.   I attest that I read the consent document to Kyle Schultz, gave Mr. Creps the opportunity to ask questions and answered the questions asked (if any). I affirm that Kyle Schultz then provided consent for he's participation in this program.     Legrand Pitts

## 2019-09-27 ENCOUNTER — Other Ambulatory Visit: Payer: Self-pay

## 2019-09-27 ENCOUNTER — Ambulatory Visit (HOSPITAL_COMMUNITY)
Admission: RE | Admit: 2019-09-27 | Discharge: 2019-09-27 | Disposition: A | Discharge status: Home / Self Care | Payer: Medicare Other | Source: Ambulatory Visit | Attending: Internal Medicine | Admitting: Internal Medicine

## 2019-09-27 ENCOUNTER — Ambulatory Visit (HOSPITAL_COMMUNITY)
Admission: RE | Disposition: A | Discharge status: Home / Self Care | Payer: Self-pay | Source: Ambulatory Visit | Attending: Internal Medicine

## 2019-09-27 DIAGNOSIS — N181 Chronic kidney disease, stage 1: Secondary | ICD-10-CM | POA: Insufficient documentation

## 2019-09-27 DIAGNOSIS — Z79899 Other long term (current) drug therapy: Secondary | ICD-10-CM | POA: Diagnosis not present

## 2019-09-27 DIAGNOSIS — E785 Hyperlipidemia, unspecified: Secondary | ICD-10-CM | POA: Diagnosis not present

## 2019-09-27 DIAGNOSIS — E1122 Type 2 diabetes mellitus with diabetic chronic kidney disease: Secondary | ICD-10-CM | POA: Diagnosis not present

## 2019-09-27 DIAGNOSIS — Z45010 Encounter for checking and testing of cardiac pacemaker pulse generator [battery]: Secondary | ICD-10-CM | POA: Insufficient documentation

## 2019-09-27 DIAGNOSIS — Z7982 Long term (current) use of aspirin: Secondary | ICD-10-CM | POA: Insufficient documentation

## 2019-09-27 DIAGNOSIS — Z8249 Family history of ischemic heart disease and other diseases of the circulatory system: Secondary | ICD-10-CM | POA: Insufficient documentation

## 2019-09-27 DIAGNOSIS — I442 Atrioventricular block, complete: Secondary | ICD-10-CM | POA: Insufficient documentation

## 2019-09-27 DIAGNOSIS — I4892 Unspecified atrial flutter: Secondary | ICD-10-CM | POA: Insufficient documentation

## 2019-09-27 DIAGNOSIS — I130 Hypertensive heart and chronic kidney disease with heart failure and stage 1 through stage 4 chronic kidney disease, or unspecified chronic kidney disease: Secondary | ICD-10-CM | POA: Diagnosis not present

## 2019-09-27 DIAGNOSIS — Z7984 Long term (current) use of oral hypoglycemic drugs: Secondary | ICD-10-CM | POA: Insufficient documentation

## 2019-09-27 HISTORY — PX: PPM GENERATOR CHANGEOUT: EP1233

## 2019-09-27 LAB — GLUCOSE, CAPILLARY: Glucose-Capillary: 106 mg/dL — ABNORMAL HIGH (ref 70–99)

## 2019-09-27 SURGERY — PPM GENERATOR CHANGEOUT

## 2019-09-27 MED ORDER — ACETAMINOPHEN 325 MG PO TABS: 325.0000 mg | ORAL_TABLET | ORAL | Status: DC | PRN

## 2019-09-27 MED ORDER — CHLORHEXIDINE GLUCONATE 4 % EX LIQD
4.0000 "application " | Freq: Once | CUTANEOUS | Status: DC
  Filled 2019-09-27: qty 60

## 2019-09-27 MED ORDER — ONDANSETRON HCL 4 MG/2ML IJ SOLN: 4.0000 mg | Freq: Four times a day (QID) | INTRAMUSCULAR | Status: DC | PRN

## 2019-09-27 MED ORDER — SODIUM CHLORIDE 0.9 % IV SOLN
INTRAVENOUS | Status: AC
  Filled 2019-09-27: qty 2

## 2019-09-27 MED ORDER — SODIUM CHLORIDE 0.9 % IV SOLN
80.0000 mg | INTRAVENOUS | Status: AC
  Administered 2019-09-27: 80 mg
  Filled 2019-09-27: qty 2

## 2019-09-27 MED ORDER — LIDOCAINE HCL (PF) 1 % IJ SOLN
INTRAMUSCULAR | Status: DC | PRN
  Administered 2019-09-27: 60 mL

## 2019-09-27 MED ORDER — CEFAZOLIN SODIUM-DEXTROSE 2-4 GM/100ML-% IV SOLN
2.0000 g | INTRAVENOUS | Status: AC
  Administered 2019-09-27: 2 g via INTRAVENOUS
  Filled 2019-09-27: qty 100

## 2019-09-27 MED ORDER — LIDOCAINE HCL 1 % IJ SOLN
INTRAMUSCULAR | Status: AC
  Filled 2019-09-27: qty 60

## 2019-09-27 MED ORDER — CEFAZOLIN SODIUM-DEXTROSE 2-4 GM/100ML-% IV SOLN
INTRAVENOUS | Status: AC
  Filled 2019-09-27: qty 100

## 2019-09-27 MED ORDER — SODIUM CHLORIDE 0.9 % IV SOLN: INTRAVENOUS | Status: DC

## 2019-09-27 SURGICAL SUPPLY — 4 items
CABLE SURGICAL S-101-97-12 (CABLE) ×2 IMPLANT
PACEMAKER ASSURITY DR-RF (Pacemaker) ×1 IMPLANT
PAD PRO RADIOLUCENT 2001M-C (PAD) ×2 IMPLANT
TRAY PACEMAKER INSERTION (PACKS) ×2 IMPLANT

## 2019-09-27 NOTE — Progress Notes (Signed)
Discharge instructions reviewed and printed for patient. Verbalized understanding. [facility] contacted and arrive to pick up patient at 2pm.

## 2019-09-27 NOTE — Progress Notes (Signed)
Transportation service contacted [phone], but went straight to voicemail. Voicemail is full. Will continue to attempt to contact for transport home.

## 2019-09-27 NOTE — Interval H&P Note (Signed)
History and Physical Interval Note:  09/27/2019 11:30 AM  Kyle Schultz  has presented today for surgery, with the diagnosis of ERI.  The various methods of treatment have been discussed with the patient and family. After consideration of risks, benefits and other options for treatment, the patient has consented to  Procedure(s): PPM GENERATOR CHANGEOUT (N/A) as a surgical intervention.  The patient's history has been reviewed, patient examined, no change in status, stable for surgery.  I have reviewed the patient's chart and labs.  Questions were answered to the patient's satisfaction.     Cristopher Peru

## 2019-09-27 NOTE — Discharge Instructions (Signed)

## 2019-09-28 MED FILL — Lidocaine HCl Local Inj 1%: INTRAMUSCULAR | Qty: 60 | Status: AC

## 2019-10-03 ENCOUNTER — Telehealth: Payer: Self-pay

## 2019-10-03 NOTE — Telephone Encounter (Signed)
Pt underwent gen changeout on 3/18, no upcoming appts scheduled.  At time of gen change, pt was noted in persistent AFl, MD notes indicate plan to to start [facility] after gen change is complete.   PM alert received for ongiong Afl since gen change.   Pt meds include ASA 81mg .

## 2019-10-05 ENCOUNTER — Telehealth: Payer: Self-pay

## 2019-10-05 NOTE — Telephone Encounter (Signed)
Pt would like to know when he can start lifting things again. (recent CATH)  Please call [phone]  Thanks renee

## 2019-10-05 NOTE — Telephone Encounter (Signed)
Returned pt call. Informed him that he cannot resume normal activities until he has a follow up appointment. He voiced understanding and thanked me for the quick response.

## 2019-10-07 LAB — CUP PACEART REMOTE DEVICE CHECK
Battery Remaining Longevity: 119 mo
Battery Remaining Percentage: 95.5 %
Battery Voltage: 3.08 V
Brady Statistic AP VP Percent: 0 %
Brady Statistic AP VS Percent: 0 %
Brady Statistic AS VP Percent: 0 %
Brady Statistic AS VS Percent: 0 %
Brady Statistic RA Percent Paced: 1 %
Brady Statistic RV Percent Paced: 99 %
Date Time Interrogation Session: 20210328032342
Implantable Lead Implant Date: 20100715
Implantable Lead Implant Date: 20100715
Implantable Lead Location: 753859
Implantable Lead Location: 753860
Implantable Pulse Generator Implant Date: 20210318
Lead Channel Impedance Value: 350 Ohm
Lead Channel Impedance Value: 430 Ohm
Lead Channel Pacing Threshold Amplitude: 0.75 V
Lead Channel Pacing Threshold Pulse Width: 0.5 ms
Lead Channel Sensing Intrinsic Amplitude: 1.6 mV
Lead Channel Setting Pacing Amplitude: 1 V
Lead Channel Setting Pacing Amplitude: 2.5 V
Lead Channel Setting Pacing Pulse Width: 0.5 ms
Lead Channel Setting Sensing Sensitivity: 4 mV
Pulse Gen Model: 2272
Pulse Gen Serial Number: 3803848

## 2019-10-08 ENCOUNTER — Ambulatory Visit (INDEPENDENT_AMBULATORY_CARE_PROVIDER_SITE_OTHER): Payer: Medicare Other | Admitting: *Deleted

## 2019-10-08 DIAGNOSIS — Z950 Presence of cardiac pacemaker: Secondary | ICD-10-CM | POA: Diagnosis not present

## 2019-10-08 NOTE — Progress Notes (Signed)
PPM Remote  

## 2019-10-11 DIAGNOSIS — E211 Secondary hyperparathyroidism, not elsewhere classified: Secondary | ICD-10-CM | POA: Diagnosis not present

## 2019-10-11 DIAGNOSIS — N189 Chronic kidney disease, unspecified: Secondary | ICD-10-CM | POA: Diagnosis not present

## 2019-10-11 DIAGNOSIS — N170 Acute kidney failure with tubular necrosis: Secondary | ICD-10-CM | POA: Diagnosis not present

## 2019-10-11 DIAGNOSIS — E1129 Type 2 diabetes mellitus with other diabetic kidney complication: Secondary | ICD-10-CM | POA: Diagnosis not present

## 2019-10-11 DIAGNOSIS — E1122 Type 2 diabetes mellitus with diabetic chronic kidney disease: Secondary | ICD-10-CM | POA: Diagnosis not present

## 2019-10-11 DIAGNOSIS — D631 Anemia in chronic kidney disease: Secondary | ICD-10-CM | POA: Diagnosis not present

## 2019-10-11 DIAGNOSIS — R809 Proteinuria, unspecified: Secondary | ICD-10-CM | POA: Diagnosis not present

## 2019-10-14 DIAGNOSIS — E785 Hyperlipidemia, unspecified: Secondary | ICD-10-CM | POA: Diagnosis not present

## 2019-10-14 DIAGNOSIS — I10 Essential (primary) hypertension: Secondary | ICD-10-CM | POA: Diagnosis not present

## 2019-10-16 ENCOUNTER — Ambulatory Visit: Payer: Medicare Other

## 2019-10-17 DIAGNOSIS — Z79899 Other long term (current) drug therapy: Secondary | ICD-10-CM | POA: Diagnosis not present

## 2019-10-17 DIAGNOSIS — E1129 Type 2 diabetes mellitus with other diabetic kidney complication: Secondary | ICD-10-CM | POA: Diagnosis not present

## 2019-10-17 DIAGNOSIS — D631 Anemia in chronic kidney disease: Secondary | ICD-10-CM | POA: Diagnosis not present

## 2019-10-17 DIAGNOSIS — R809 Proteinuria, unspecified: Secondary | ICD-10-CM | POA: Diagnosis not present

## 2019-10-17 DIAGNOSIS — N170 Acute kidney failure with tubular necrosis: Secondary | ICD-10-CM | POA: Diagnosis not present

## 2019-10-17 DIAGNOSIS — N189 Chronic kidney disease, unspecified: Secondary | ICD-10-CM | POA: Diagnosis not present

## 2019-10-17 DIAGNOSIS — E1122 Type 2 diabetes mellitus with diabetic chronic kidney disease: Secondary | ICD-10-CM | POA: Diagnosis not present

## 2019-10-17 DIAGNOSIS — E211 Secondary hyperparathyroidism, not elsewhere classified: Secondary | ICD-10-CM | POA: Diagnosis not present

## 2019-10-22 NOTE — Telephone Encounter (Signed)
Scheduled patient for appointment with Dr. Lovena Le on 10/24/19 at 9:15am in [place] for wound check and [facility] discussion. Pt aware of appointment date and time, as well as office location. Pt denies questions or concerns at this time.

## 2019-10-23 DIAGNOSIS — Z23 Encounter for immunization: Secondary | ICD-10-CM | POA: Diagnosis not present

## 2019-10-24 ENCOUNTER — Ambulatory Visit (INDEPENDENT_AMBULATORY_CARE_PROVIDER_SITE_OTHER): Payer: Medicare Other | Admitting: Internal Medicine

## 2019-10-24 ENCOUNTER — Encounter: Payer: Self-pay | Admitting: Internal Medicine

## 2019-10-24 ENCOUNTER — Other Ambulatory Visit: Payer: Self-pay

## 2019-10-24 VITALS — BP 124/68 | HR 70 | Temp 97.5°F | Ht 71.0 in | Wt 201.2 lb

## 2019-10-24 DIAGNOSIS — I442 Atrioventricular block, complete: Secondary | ICD-10-CM

## 2019-10-24 DIAGNOSIS — I495 Sick sinus syndrome: Secondary | ICD-10-CM

## 2019-10-24 DIAGNOSIS — Z950 Presence of cardiac pacemaker: Secondary | ICD-10-CM

## 2019-10-24 DIAGNOSIS — Z79899 Other long term (current) drug therapy: Secondary | ICD-10-CM | POA: Diagnosis not present

## 2019-10-24 MED ORDER — APIXABAN 2.5 MG PO TABS: 2.5000 mg | ORAL_TABLET | Freq: Two times a day (BID) | ORAL | 11 refills | Status: DC

## 2019-10-24 NOTE — Addendum Note (Signed)
Addended by: Levonne Hubert on: 10/24/2019 09:53 AM   Modules accepted: Orders

## 2019-10-24 NOTE — Patient Instructions (Addendum)
Medication Instructions:  Your physician has recommended you make the following change in your medication:  Stop Taking Aspirin   Start Taking Eliquis 2.5 mg Two Times Daily   *If you need a refill on your cardiac medications before your next appointment, please call your pharmacy*   Lab Work: Your physician recommends that you return for lab work in: 1 Month   If you have labs (blood work) drawn today and your tests are completely normal, you will receive your results only by: Marland Kitchen MyChart Message (if you have MyChart) OR . A paper copy in the mail If you have any lab test that is abnormal or we need to change your treatment, we will call you to review the results.   Testing/Procedures: NONE    Follow-Up: At [facility], you and your health needs are our priority.  As part of our continuing mission to provide you with exceptional heart care, we have created designated Provider Care Teams.  These Care Teams include your primary Cardiologist (physician) and Advanced Practice Providers (APPs -  Physician Assistants and Nurse Practitioners) who all work together to provide you with the care you need, when you need it.  We recommend signing up for the patient portal called "MyChart".  Sign up information is provided on this After Visit Summary.  MyChart is used to connect with patients for Virtual Visits (Telemedicine).  Patients are able to view lab/test results, encounter notes, upcoming appointments, etc.  Non-urgent messages can be sent to your provider as well.   To learn more about what you can do with MyChart, go to [email or web address].    Your next appointment:   1 year(s)  The format for your next appointment:   In Person  Provider:   Cristopher Peru, MD   Other Instructions Thank you for choosing [facility]!

## 2019-10-24 NOTE — Progress Notes (Signed)
HPI Kyle Schultz returns today after undergoing PPM gen change out. He has a h/o HTN and persistent atypical atrial flutter. He has a h/o non-compliance and difficulty with his rides. He has not had syncope.  Allergies  Allergen Reactions  . Other     "some kind of fluid pill I can't take"      Current Outpatient Medications  Medication Sig Dispense Refill  . aspirin 81 MG tablet Take 81 mg by mouth every evening.     . carbamide peroxide (DEBROX) 6.5 % OTIC solution Place 5 drops into both ears 2 (two) times daily. 15 mL 0  . docusate sodium (COLACE) 100 MG capsule Take 100 mg by mouth daily as needed for mild constipation.    . fenofibrate 160 MG tablet Take 160 mg by mouth every evening.     . furosemide (LASIX) 40 MG tablet daily.    Marland Kitchen glipiZIDE (GLUCOTROL) 5 MG tablet Take 1 tablet (5 mg total) by mouth 2 (two) times daily with a meal. 60 tablet 3  . glucose blood test strip 1 each by Other route 2 (two) times daily. Use as instructed 2 x daily. E11.65 EZ MAX 100 each 5  . linagliptin (TRADJENTA) 5 MG TABS tablet Take 5 mg by mouth daily.    Marland Kitchen losartan (COZAAR) 100 MG tablet Take 100 mg by mouth daily.     . simvastatin (ZOCOR) 20 MG tablet daily.     No current facility-administered medications for this visit.     Past Medical History:  Diagnosis Date  . CKD (chronic kidney disease), stage I   . Diabetes mellitus   . First degree AV block   . Hyperlipidemia   . Hypertension   . Second degree AV block   . Sinus bradycardia     ROS:   All systems reviewed and negative except as noted in the HPI.   Past Surgical History:  Procedure Laterality Date  . COLONOSCOPY  2010   Two 4 mm sessile hepatic flexure polyps, one slightly pedunculated 6 to 8 mm sessile descending colon polyp, small internal hemorrhoids. Simple adenomas.   . colonscopy    . HEMORRHOID SURGERY    . PACEMAKER INSERTION  2010   St jude  . PPM GENERATOR CHANGEOUT N/A 09/27/2019   Procedure: PPM  GENERATOR CHANGEOUT;  Surgeon: Evans Lance, MD;  Location: [facility];  Service: Cardiovascular;  Laterality: N/A;  . UMBILICAL HERNIA REPAIR  04/2008     Family History  Problem Relation Age of Onset  . Heart disease Father   . Cancer Sister   . Cancer Sister   . Cancer Sister   . ALS Sister   . Colon cancer Neg Hx      Social History   Socioeconomic History  . Marital status: Widowed    Spouse name: Not on file  . Number of children: Not on file  . Years of education: Not on file  . Highest education level: Not on file  Occupational History  . Occupation: Retired, Optometrist Tobacco  Tobacco Use  . Smoking status: Never Smoker  . Smokeless tobacco: Never Used  Substance and Sexual Activity  . Alcohol use: No    Alcohol/week: 0.0 standard drinks  . Drug use: No  . Sexual activity: Never    Birth control/protection: Abstinence  Other Topics Concern  . Not on file  Social History Narrative   Widowed   1 child  Walks daily   Social Determinants of Health   Financial Resource Strain:   . Difficulty of Paying Living Expenses:   Food Insecurity:   . Worried About Charity fundraiser in the Last Year:   . Arboriculturist in the Last Year:   Transportation Needs:   . Film/video editor (Medical):   Marland Kitchen Lack of Transportation (Non-Medical):   Physical Activity:   . Days of Exercise per Week:   . Minutes of Exercise per Session:   Stress:   . Feeling of Stress :   Social Connections:   . Frequency of Communication with Friends and Family:   . Frequency of Social Gatherings with Friends and Family:   . Attends Religious Services:   . Active Member of Clubs or Organizations:   . Attends Archivist Meetings:   Marland Kitchen Marital Status:   Intimate Partner Violence:   . Fear of Current or Ex-Partner:   . Emotionally Abused:   Marland Kitchen Physically Abused:   . Sexually Abused:      BP 124/68   Pulse 70   Temp (!) 97.5 F (36.4 C)   Ht 5\' 11"  (1.803  m)   Wt 201 lb 3.2 oz (91.3 kg)   SpO2 94%   BMI 28.06 kg/m   Physical Exam:  Well appearing NAD HEENT: Unremarkable Neck:  No JVD, no thyromegally Lymphatics:  No adenopathy Back:  No CVA tenderness Lungs:  Clear with no wheezes HEART:  Regular rate rhythm, no murmurs, no rubs, no clicks Abd:  soft, positive bowel sounds, no organomegally, no rebound, no guarding Ext:  2 plus pulses, no edema, no cyanosis, no clubbing Skin:  No rashes no nodules Neuro:  CN II through XII intact, motor grossly intact   DEVICE  Normal device function.  See PaceArt for details.   Assess/Plan: 1. Atrial flutter - he has atypical flutter and I have recommended we start him on eliquis 2.5 mg twice daily. He will return in a month for a BMP and CBC. 2. CHB - he is asymptomatic, s/p PPM insertion. 3. PPM - his [employer] Jude DDD PM is working normally. We will recheck in several months. 4. Chronic diastolic heart failure - his symptoms remain class 2. No change in his current meds.  [email or web address] [email or web address].

## 2019-11-02 DIAGNOSIS — B351 Tinea unguium: Secondary | ICD-10-CM | POA: Diagnosis not present

## 2019-11-02 DIAGNOSIS — E1142 Type 2 diabetes mellitus with diabetic polyneuropathy: Secondary | ICD-10-CM | POA: Diagnosis not present

## 2019-11-02 DIAGNOSIS — L851 Acquired keratosis [keratoderma] palmaris et plantaris: Secondary | ICD-10-CM | POA: Diagnosis not present

## 2019-11-09 LAB — CUP PACEART INCLINIC DEVICE CHECK
Battery Remaining Longevity: 126 mo
Battery Voltage: 3.04 V
Brady Statistic RA Percent Paced: 0 %
Brady Statistic RV Percent Paced: 99.76 %
Date Time Interrogation Session: 20210414081500
Implantable Lead Implant Date: 20100715
Implantable Lead Implant Date: 20100715
Implantable Lead Location: 753859
Implantable Lead Location: 753860
Implantable Pulse Generator Implant Date: 20210318
Lead Channel Impedance Value: 350 Ohm
Lead Channel Impedance Value: 375 Ohm
Lead Channel Pacing Threshold Amplitude: 0.75 V
Lead Channel Pacing Threshold Pulse Width: 0.5 ms
Lead Channel Sensing Intrinsic Amplitude: 1.7 mV
Lead Channel Sensing Intrinsic Amplitude: 9.9 mV
Lead Channel Setting Pacing Amplitude: 1 V
Lead Channel Setting Pacing Amplitude: 2.5 V
Lead Channel Setting Pacing Pulse Width: 0.5 ms
Lead Channel Setting Sensing Sensitivity: 4 mV
Pulse Gen Model: 2272
Pulse Gen Serial Number: 3803848

## 2019-11-13 DIAGNOSIS — E785 Hyperlipidemia, unspecified: Secondary | ICD-10-CM | POA: Diagnosis not present

## 2019-11-13 DIAGNOSIS — Z1331 Encounter for screening for depression: Secondary | ICD-10-CM | POA: Diagnosis not present

## 2019-11-13 DIAGNOSIS — E1165 Type 2 diabetes mellitus with hyperglycemia: Secondary | ICD-10-CM | POA: Diagnosis not present

## 2019-11-13 DIAGNOSIS — Z79899 Other long term (current) drug therapy: Secondary | ICD-10-CM | POA: Diagnosis not present

## 2019-11-13 DIAGNOSIS — I10 Essential (primary) hypertension: Secondary | ICD-10-CM | POA: Diagnosis not present

## 2019-11-13 DIAGNOSIS — E211 Secondary hyperparathyroidism, not elsewhere classified: Secondary | ICD-10-CM | POA: Diagnosis not present

## 2019-11-13 DIAGNOSIS — Z1389 Encounter for screening for other disorder: Secondary | ICD-10-CM | POA: Diagnosis not present

## 2019-11-13 DIAGNOSIS — N1831 Chronic kidney disease, stage 3a: Secondary | ICD-10-CM | POA: Diagnosis not present

## 2019-11-13 DIAGNOSIS — I442 Atrioventricular block, complete: Secondary | ICD-10-CM | POA: Diagnosis not present

## 2019-11-23 ENCOUNTER — Other Ambulatory Visit: Payer: Self-pay

## 2019-11-23 ENCOUNTER — Other Ambulatory Visit (HOSPITAL_COMMUNITY)
Admission: RE | Admit: 2019-11-23 | Discharge: 2019-11-23 | Disposition: A | Discharge status: Home / Self Care | Payer: Medicare Other | Source: Ambulatory Visit | Attending: Internal Medicine | Admitting: Internal Medicine

## 2019-11-23 DIAGNOSIS — Z950 Presence of cardiac pacemaker: Secondary | ICD-10-CM | POA: Insufficient documentation

## 2019-11-23 DIAGNOSIS — Z79899 Other long term (current) drug therapy: Secondary | ICD-10-CM | POA: Diagnosis not present

## 2019-11-23 DIAGNOSIS — I442 Atrioventricular block, complete: Secondary | ICD-10-CM | POA: Insufficient documentation

## 2019-11-23 DIAGNOSIS — I495 Sick sinus syndrome: Secondary | ICD-10-CM | POA: Diagnosis not present

## 2019-11-23 LAB — CBC WITH DIFFERENTIAL/PLATELET
Abs Immature Granulocytes: 0.01 10*3/uL (ref 0.00–0.07)
Basophils Absolute: 0 10*3/uL (ref 0.0–0.1)
Basophils Relative: 1 %
Eosinophils Absolute: 0.3 10*3/uL (ref 0.0–0.5)
Eosinophils Relative: 7 %
HCT: 37.3 % — ABNORMAL LOW (ref 39.0–52.0)
Hemoglobin: 11.5 g/dL — ABNORMAL LOW (ref 13.0–17.0)
Immature Granulocytes: 0 %
Lymphocytes Relative: 32 %
Lymphs Abs: 1.3 10*3/uL (ref 0.7–4.0)
MCH: 25.6 pg — ABNORMAL LOW (ref 26.0–34.0)
MCHC: 30.8 g/dL (ref 30.0–36.0)
MCV: 82.9 fL (ref 80.0–100.0)
Monocytes Absolute: 0.3 10*3/uL (ref 0.1–1.0)
Monocytes Relative: 7 %
Neutro Abs: 2.1 10*3/uL (ref 1.7–7.7)
Neutrophils Relative %: 53 %
Platelets: 211 10*3/uL (ref 150–400)
RBC: 4.5 MIL/uL (ref 4.22–5.81)
RDW: 15.1 % (ref 11.5–15.5)
WBC: 3.9 10*3/uL — ABNORMAL LOW (ref 4.0–10.5)
nRBC: 0 % (ref 0.0–0.2)

## 2019-11-23 LAB — BASIC METABOLIC PANEL
Anion gap: 10 (ref 5–15)
BUN: 55 mg/dL — ABNORMAL HIGH (ref 8–23)
CO2: 28 mmol/L (ref 22–32)
Calcium: 9.4 mg/dL (ref 8.9–10.3)
Chloride: 102 mmol/L (ref 98–111)
Creatinine, Ser: 2.26 mg/dL — ABNORMAL HIGH (ref 0.61–1.24)
GFR calc Af Amer: 29 mL/min — ABNORMAL LOW (ref >60)
GFR calc non Af Amer: 25 mL/min — ABNORMAL LOW (ref >60)
Glucose, Bld: 123 mg/dL — ABNORMAL HIGH (ref 70–99)
Potassium: 4.3 mmol/L (ref 3.5–5.1)
Sodium: 140 mmol/L (ref 135–145)

## 2019-11-24 ENCOUNTER — Other Ambulatory Visit: Payer: Self-pay

## 2019-11-24 ENCOUNTER — Emergency Department (HOSPITAL_COMMUNITY): Payer: Medicare Other

## 2019-11-24 ENCOUNTER — Encounter (HOSPITAL_COMMUNITY): Payer: Self-pay | Admitting: Emergency Medicine

## 2019-11-24 ENCOUNTER — Emergency Department (HOSPITAL_COMMUNITY)
Admission: EM | Admit: 2019-11-24 | Discharge: 2019-11-24 | Disposition: A | Discharge status: Home / Self Care | Payer: Medicare Other | Attending: Emergency Medicine | Admitting: Emergency Medicine

## 2019-11-24 DIAGNOSIS — S8992XA Unspecified injury of left lower leg, initial encounter: Secondary | ICD-10-CM | POA: Diagnosis not present

## 2019-11-24 DIAGNOSIS — W19XXXA Unspecified fall, initial encounter: Secondary | ICD-10-CM | POA: Diagnosis not present

## 2019-11-24 DIAGNOSIS — N181 Chronic kidney disease, stage 1: Secondary | ICD-10-CM | POA: Diagnosis not present

## 2019-11-24 DIAGNOSIS — E119 Type 2 diabetes mellitus without complications: Secondary | ICD-10-CM | POA: Insufficient documentation

## 2019-11-24 DIAGNOSIS — W01198A Fall on same level from slipping, tripping and stumbling with subsequent striking against other object, initial encounter: Secondary | ICD-10-CM | POA: Diagnosis not present

## 2019-11-24 DIAGNOSIS — Y939 Activity, unspecified: Secondary | ICD-10-CM | POA: Insufficient documentation

## 2019-11-24 DIAGNOSIS — Z79899 Other long term (current) drug therapy: Secondary | ICD-10-CM | POA: Insufficient documentation

## 2019-11-24 DIAGNOSIS — R5381 Other malaise: Secondary | ICD-10-CM | POA: Diagnosis not present

## 2019-11-24 DIAGNOSIS — Y999 Unspecified external cause status: Secondary | ICD-10-CM | POA: Insufficient documentation

## 2019-11-24 DIAGNOSIS — Z7984 Long term (current) use of oral hypoglycemic drugs: Secondary | ICD-10-CM | POA: Insufficient documentation

## 2019-11-24 DIAGNOSIS — S8991XA Unspecified injury of right lower leg, initial encounter: Secondary | ICD-10-CM | POA: Diagnosis not present

## 2019-11-24 DIAGNOSIS — S0990XA Unspecified injury of head, initial encounter: Secondary | ICD-10-CM | POA: Diagnosis not present

## 2019-11-24 DIAGNOSIS — I1310 Hypertensive heart and chronic kidney disease without heart failure, with stage 1 through stage 4 chronic kidney disease, or unspecified chronic kidney disease: Secondary | ICD-10-CM | POA: Insufficient documentation

## 2019-11-24 DIAGNOSIS — S00511A Abrasion of lip, initial encounter: Secondary | ICD-10-CM

## 2019-11-24 DIAGNOSIS — M25561 Pain in right knee: Secondary | ICD-10-CM | POA: Diagnosis not present

## 2019-11-24 DIAGNOSIS — Y929 Unspecified place or not applicable: Secondary | ICD-10-CM | POA: Insufficient documentation

## 2019-11-24 DIAGNOSIS — Z7901 Long term (current) use of anticoagulants: Secondary | ICD-10-CM | POA: Insufficient documentation

## 2019-11-24 DIAGNOSIS — M25462 Effusion, left knee: Secondary | ICD-10-CM

## 2019-11-24 DIAGNOSIS — R69 Illness, unspecified: Secondary | ICD-10-CM | POA: Diagnosis not present

## 2019-11-24 DIAGNOSIS — M25562 Pain in left knee: Secondary | ICD-10-CM | POA: Diagnosis not present

## 2019-11-24 NOTE — ED Notes (Signed)
Out of bed to br   Ambulates heel to toe without stagger or drift

## 2019-11-24 NOTE — ED Triage Notes (Signed)
Patient brought in via EMS. Alert and oriented. Patient tripped on concrete today. Per patient landed on knees bilaterally and hit lip. Denies hitting head or LOC. Denies any dizziness. Patient has small puncture wound in mouth from tooth. Patient does take Eliqus but bleeding is controlled. Patient unsure of last tetanus vaccination.

## 2019-11-24 NOTE — ED Notes (Signed)
Tripped on concrete and fell today   Fell onto bilateral knees   Puncture wound to lip from tooth   Denies hitting head or LOC

## 2019-11-24 NOTE — Discharge Instructions (Addendum)
Your CT scan is negative for any acute head injuries from your fall.  The x-ray of your left knee suggest a small amount of fluid in the knee joint space which sometimes can suggest an injury, however there are no fractures noted on your x-ray.  You can wear the knee sleeve we provided you if needed for comfort.  Ice and elevation can also help with any pain.

## 2019-11-26 NOTE — ED Provider Notes (Signed)
[facility] EMERGENCY DEPARTMENT Provider Note   CSN: [id number] Arrival date & time: 11/24/19  1710     History Chief Complaint  Patient presents with  . Fall    Kyle Schultz is a 86 y.o. male with a history of HTN, DM and renal insufficiency presenting for evaluation of a fall which occurred prior to arrival. He tripped over uneven pavement landing directly on his knees, and grazed his lip against the pavement with puncture wound to his inner lip from biting it.  He denies significant head injury but is on Eliquis.  Denies weakness or numbness, no dizziness, denies loc, also no upper extremity pain, neck or back pain.  He is ambulatory, reports mild pain in bilateral knees with weight bearing, left greater than right.  He has had no treatments prior to arrival.  HPI     Past Medical History:  Diagnosis Date  . CKD (chronic kidney disease), stage I   . Diabetes mellitus   . First degree AV block   . Hyperlipidemia   . Hypertension   . Second degree AV block   . Sinus bradycardia     Patient Active Problem List   Diagnosis Date Noted  . History of colonic polyps 03/20/2019  . Mixed hyperlipidemia 11/03/2017  . Uncontrolled type 2 diabetes mellitus with stage 3 chronic kidney disease ([place]) 08/11/2017  . Type 2 diabetes mellitus with other circulatory complications ([place]) 81/85/6314  . Leukopenia 03/31/2017  . Chest pain 03/31/2017  . SIRS (systemic inflammatory response syndrome) ([place]) 05/31/2016  . Fever 05/31/2016  . Chronic renal insufficiency, stage III (moderate) 06/26/2015  . Palpitations 06/26/2015  . SINOATRIAL NODE DYSFUNCTION 08/01/2009  . Cardiac pacemaker in situ 08/01/2009  . Dyslipidemia 03/10/2009  . Essential hypertension, benign 03/10/2009    Past Surgical History:  Procedure Laterality Date  . COLONOSCOPY  2010   Two 4 mm sessile hepatic flexure polyps, one slightly pedunculated 6 to 8 mm sessile descending colon polyp, small internal hemorrhoids.  Simple adenomas.   . colonscopy    . HEMORRHOID SURGERY    . PACEMAKER INSERTION  2010   [facility]  . PPM GENERATOR CHANGEOUT N/A 09/27/2019   Procedure: PPM GENERATOR CHANGEOUT;  Surgeon: Evans Lance, MD;  Location: [place] [facility];  Service: Cardiovascular;  Laterality: N/A;  . UMBILICAL HERNIA REPAIR  04/2008       Family History  Problem Relation Age of Onset  . Heart disease Father   . Cancer Sister   . Cancer Sister   . Cancer Sister   . ALS Sister   . Colon cancer Neg Hx     Social History   Tobacco Use  . Smoking status: Never Smoker  . Smokeless tobacco: Never Used  Substance Use Topics  . Alcohol use: No    Alcohol/week: 0.0 standard drinks  . Drug use: No    Home Medications Prior to Admission medications   Medication Sig Start Date End Date Taking? Authorizing Provider  apixaban (ELIQUIS) 2.5 MG TABS tablet Take 1 tablet (2.5 mg total) by mouth 2 (two) times daily. 10/24/19  Yes Evans Lance, MD  docusate sodium (COLACE) 100 MG capsule Take 100 mg by mouth daily as needed for mild constipation.   Yes [provider]  fenofibrate 160 MG tablet Take 160 mg by mouth every evening.    Yes [provider]  furosemide (LASIX) 20 MG tablet Take 20 mg by mouth 2 (two) times daily. 11/20/19  Yes [provider]  glipiZIDE (GLUCOTROL) 5 MG tablet Take 1 tablet (5 mg total) by mouth 2 (two) times daily with a meal. 07/10/18  Yes Nida, Marella Chimes, MD  glucose blood test strip 1 each by Other route 2 (two) times daily. Use as instructed 2 x daily. E11.65 EZ MAX 05/01/18  Yes Nida, Marella Chimes, MD  linagliptin (TRADJENTA) 5 MG TABS tablet Take 5 mg by mouth daily.   Yes [provider]  losartan (COZAAR) 100 MG tablet Take 100 mg by mouth daily.  02/13/11  Yes [provider]  carbamide peroxide (DEBROX) 6.5 % OTIC solution Place 5 drops into both ears 2 (two) times daily. 07/05/19   Domenic Moras, PA-C    Allergies     Other  Review of Systems   Review of Systems  Constitutional: Negative.  Negative for fever.  HENT: Negative for dental problem.   Respiratory: Negative.   Gastrointestinal: Negative.   Musculoskeletal: Positive for arthralgias. Negative for joint swelling and myalgias.  Skin: Positive for wound.  Neurological: Negative for dizziness, syncope, weakness, light-headedness, numbness and headaches.    Physical Exam Updated Vital Signs BP (!) 148/86 (BP Location: Right Arm)   Pulse 64   Temp 97.8 F (36.6 C) (Oral)   Resp 18   Ht 6' (1.829 m)   Wt 93 kg   SpO2 100%   BMI 27.80 kg/m   Physical Exam Constitutional:      Appearance: Normal appearance. He is well-developed.  HENT:     Head: Normocephalic and atraumatic. No raccoon eyes, Battle's sign or contusion.     Jaw: There is normal jaw occlusion. No tenderness or pain on movement.     Right Ear: Tympanic membrane normal.     Left Ear: Tympanic membrane normal.     Mouth/Throat:     Comments: Superficial abrasion lower lip mucosa. No external injury.  Hemostatic. No dental injury. Cardiovascular:     Rate and Rhythm: Normal rate.     Heart sounds: Normal heart sounds.     Comments: Pulses equal bilaterally Pulmonary:     Effort: Pulmonary effort is normal.     Breath sounds: Normal breath sounds.  Abdominal:     General: Bowel sounds are normal.  Musculoskeletal:        General: Tenderness present.     Cervical back: Normal range of motion.     Right knee: Normal. No swelling, deformity, effusion or erythema.     Left knee: No swelling, deformity, effusion or erythema. Tenderness present.     Comments: Mild left patellar tenderness. No edema or swelling.  Pt displays FROM of bilateral knees. No ligament or joint instability.  Skin:    General: Skin is warm and dry.     Findings: No bruising.  Neurological:     General: No focal deficit present.     Mental Status: He is alert and oriented to person, place, and  time.     Sensory: No sensory deficit.     Deep Tendon Reflexes: Reflexes normal.     ED Results / Procedures / Treatments   Labs (all labs ordered are listed, but only abnormal results are displayed) Labs Reviewed - No data to display  EKG None  Radiology CT Head Wo Contrast  Result Date: 11/24/2019 CLINICAL DATA:  Tripped on concrete today, facial and head trauma. No loss of consciousness. On anticoagulation. EXAM: CT HEAD WITHOUT CONTRAST TECHNIQUE: Contiguous axial images were obtained from the  base of the skull through the vertex without intravenous contrast. COMPARISON:  None. FINDINGS: Brain: No intracranial hemorrhage, mass effect, or midline shift. 6 mm calcified extra-axial density in the right middle cranial fossa may represent a calcified meningioma or calvarial hyperostosis, no associated mass effect. Age related atrophy. Mild chronic small vessel ischemia. Remote lacunar infarct in the left caudate. No hydrocephalus. The basilar cisterns are patent. No evidence of territorial infarct or acute ischemia. No extra-axial or intracranial fluid collection. Vascular: Atherosclerosis of skullbase vasculature without hyperdense vessel or abnormal calcification. Skull: No skull fracture. Sinuses/Orbits: Ethmoid sinus mucosal thickening. No visualized facial bone fracture or sinus fluid level. Mastoid air cells are well aerated. Included orbits are unremarkable. Other: None. IMPRESSION: 1. No acute intracranial abnormality. No skull fracture. 2. Age related atrophy and chronic small vessel ischemia. Remote lacunar infarct in the left caudate. 3. Small incidental 6 mm calcified extra-axial density in the right middle cranial fossa may represent a calcified meningioma or calvarial hyperostosis, no associated mass effect. Electronically Signed   By: Keith Rake M.D.   On: 11/24/2019 18:38   DG Knee Complete 4 Views Left  Result Date: 11/24/2019 CLINICAL DATA:  Fall, pain EXAM: LEFT KNEE -  COMPLETE 4+ VIEW COMPARISON:  None. FINDINGS: There is a small lipohemarthrosis present. However on this radiograph no displaced fracture is seen. There is tricompartmental osteoarthritis, most notable in the patellofemoral compartment. There is diffuse osteopenia. Soft tissue swelling seen over the lateral aspect of the knee. Scattered vascular calcifications. IMPRESSION: Small lipohemarthrosis present. No displaced fracture on this radiograph, however this is limited due to diffuse osteopenia. If further evaluation is required would recommend cross-sectional imaging. Electronically Signed   By: Prudencio Pair M.D.   On: 11/24/2019 19:05   DG Knee Complete 4 Views Right  Result Date: 11/24/2019 CLINICAL DATA:  Knee pain after fall EXAM: RIGHT KNEE - COMPLETE 4+ VIEW COMPARISON:  None. FINDINGS: There is diffuse osteopenia. Tricompartmental osteoarthritis is seen most notable in the patellofemoral compartment joint space loss and marginal osteophyte formation. No large knee joint effusion is seen. IMPRESSION: No acute osseous abnormality. Electronically Signed   By: Prudencio Pair M.D.   On: 11/24/2019 19:06    Procedures Procedures (including critical care time)  Medications Ordered in ED Medications - No data to display  ED Course  I have reviewed the triage vital signs and the nursing notes.  Pertinent labs & imaging results that were available during my care of the patient were reviewed by me and considered in my medical decision making (see chart for details).    MDM Rules/Calculators/A&P                     Reviewed and discussed imaging with patient including effusion left knee. Pt had minimal pain in the knee during exam and with ambulation in the dept.  He was placed in a knee brace for comfort and support. Discussed ice, elevation and recheck by orthopedics, referrals given.  Pt understands plan and need for f/u care.   Final Clinical Impression(s) / ED Diagnoses Final diagnoses:    Fall, initial encounter  Abrasion of lip, initial encounter  Knee effusion, left    Rx / DC Orders ED Discharge Orders    None       Landis Martins 11/26/19 Ronald Lobo, MD 11/28/19 1446

## 2019-12-12 DIAGNOSIS — D631 Anemia in chronic kidney disease: Secondary | ICD-10-CM | POA: Diagnosis not present

## 2019-12-12 DIAGNOSIS — E1129 Type 2 diabetes mellitus with other diabetic kidney complication: Secondary | ICD-10-CM | POA: Diagnosis not present

## 2019-12-12 DIAGNOSIS — N189 Chronic kidney disease, unspecified: Secondary | ICD-10-CM | POA: Diagnosis not present

## 2019-12-12 DIAGNOSIS — E211 Secondary hyperparathyroidism, not elsewhere classified: Secondary | ICD-10-CM | POA: Diagnosis not present

## 2019-12-12 DIAGNOSIS — N170 Acute kidney failure with tubular necrosis: Secondary | ICD-10-CM | POA: Diagnosis not present

## 2019-12-12 DIAGNOSIS — E1122 Type 2 diabetes mellitus with diabetic chronic kidney disease: Secondary | ICD-10-CM | POA: Diagnosis not present

## 2019-12-12 DIAGNOSIS — R809 Proteinuria, unspecified: Secondary | ICD-10-CM | POA: Diagnosis not present

## 2019-12-12 DIAGNOSIS — Z79899 Other long term (current) drug therapy: Secondary | ICD-10-CM | POA: Diagnosis not present

## 2019-12-14 DIAGNOSIS — E1165 Type 2 diabetes mellitus with hyperglycemia: Secondary | ICD-10-CM | POA: Diagnosis not present

## 2019-12-14 DIAGNOSIS — N1831 Chronic kidney disease, stage 3a: Secondary | ICD-10-CM | POA: Diagnosis not present

## 2019-12-19 DIAGNOSIS — E1129 Type 2 diabetes mellitus with other diabetic kidney complication: Secondary | ICD-10-CM | POA: Diagnosis not present

## 2019-12-19 DIAGNOSIS — R809 Proteinuria, unspecified: Secondary | ICD-10-CM | POA: Diagnosis not present

## 2019-12-19 DIAGNOSIS — E211 Secondary hyperparathyroidism, not elsewhere classified: Secondary | ICD-10-CM | POA: Diagnosis not present

## 2019-12-19 DIAGNOSIS — I5032 Chronic diastolic (congestive) heart failure: Secondary | ICD-10-CM | POA: Diagnosis not present

## 2019-12-19 DIAGNOSIS — E1122 Type 2 diabetes mellitus with diabetic chronic kidney disease: Secondary | ICD-10-CM | POA: Diagnosis not present

## 2019-12-19 DIAGNOSIS — D631 Anemia in chronic kidney disease: Secondary | ICD-10-CM | POA: Diagnosis not present

## 2019-12-19 DIAGNOSIS — N189 Chronic kidney disease, unspecified: Secondary | ICD-10-CM | POA: Diagnosis not present

## 2019-12-27 ENCOUNTER — Ambulatory Visit (INDEPENDENT_AMBULATORY_CARE_PROVIDER_SITE_OTHER): Payer: Medicare Other | Admitting: *Deleted

## 2019-12-27 DIAGNOSIS — I442 Atrioventricular block, complete: Secondary | ICD-10-CM

## 2019-12-27 DIAGNOSIS — I495 Sick sinus syndrome: Secondary | ICD-10-CM

## 2019-12-27 LAB — CUP PACEART REMOTE DEVICE CHECK
Battery Remaining Longevity: 117 mo
Battery Remaining Percentage: 95.5 %
Battery Voltage: 3.02 V
Brady Statistic AP VP Percent: 0 %
Brady Statistic AP VS Percent: 0 %
Brady Statistic AS VP Percent: 0 %
Brady Statistic AS VS Percent: 0 %
Brady Statistic RA Percent Paced: 1 %
Brady Statistic RV Percent Paced: 99 %
Date Time Interrogation Session: 20210617020013
Implantable Lead Implant Date: 20100715
Implantable Lead Implant Date: 20100715
Implantable Lead Location: 753859
Implantable Lead Location: 753860
Implantable Pulse Generator Implant Date: 20210318
Lead Channel Impedance Value: 340 Ohm
Lead Channel Impedance Value: 350 Ohm
Lead Channel Pacing Threshold Amplitude: 0.75 V
Lead Channel Pacing Threshold Pulse Width: 0.5 ms
Lead Channel Sensing Intrinsic Amplitude: 1.7 mV
Lead Channel Sensing Intrinsic Amplitude: 12 mV
Lead Channel Setting Pacing Amplitude: 1 V
Lead Channel Setting Pacing Amplitude: 2.5 V
Lead Channel Setting Pacing Pulse Width: 0.5 ms
Lead Channel Setting Sensing Sensitivity: 4 mV
Pulse Gen Model: 2272
Pulse Gen Serial Number: 3803848

## 2019-12-27 NOTE — Progress Notes (Signed)
Remote pacemaker transmission.   

## 2020-01-17 ENCOUNTER — Telehealth: Payer: Self-pay | Admitting: Internal Medicine

## 2020-01-17 NOTE — Telephone Encounter (Signed)
Pt need instructions again on how to hook phone up.   Please call again [phone]   Thanks renee

## 2020-01-17 NOTE — Telephone Encounter (Signed)
I Spoke with the pt to let him know his monitor do not need a phone line. All he has to do is plug it in the wall.

## 2020-01-17 NOTE — Telephone Encounter (Signed)
Patient called and explained that remote monitor should not affect phone bill and he needs to contact [employer] about bills. Patient educated on the fact that his monitor is not using data or generating a bill through phone company because [place] incurs the cost. Patient agreed to plug monitor back in.

## 2020-01-17 NOTE — Telephone Encounter (Signed)
Pt states he has turned device off because it is running up his phone bill.  Pt states his phone bill is over $100.00.   Please call [phone]   Thanks renee

## 2020-02-13 DIAGNOSIS — N1831 Chronic kidney disease, stage 3a: Secondary | ICD-10-CM | POA: Diagnosis not present

## 2020-02-13 DIAGNOSIS — E1165 Type 2 diabetes mellitus with hyperglycemia: Secondary | ICD-10-CM | POA: Diagnosis not present

## 2020-02-14 DIAGNOSIS — E1122 Type 2 diabetes mellitus with diabetic chronic kidney disease: Secondary | ICD-10-CM | POA: Diagnosis not present

## 2020-02-14 DIAGNOSIS — E211 Secondary hyperparathyroidism, not elsewhere classified: Secondary | ICD-10-CM | POA: Diagnosis not present

## 2020-02-14 DIAGNOSIS — D631 Anemia in chronic kidney disease: Secondary | ICD-10-CM | POA: Diagnosis not present

## 2020-02-14 DIAGNOSIS — E1129 Type 2 diabetes mellitus with other diabetic kidney complication: Secondary | ICD-10-CM | POA: Diagnosis not present

## 2020-02-14 DIAGNOSIS — N189 Chronic kidney disease, unspecified: Secondary | ICD-10-CM | POA: Diagnosis not present

## 2020-02-14 DIAGNOSIS — R809 Proteinuria, unspecified: Secondary | ICD-10-CM | POA: Diagnosis not present

## 2020-02-22 DIAGNOSIS — I5032 Chronic diastolic (congestive) heart failure: Secondary | ICD-10-CM | POA: Diagnosis not present

## 2020-02-22 DIAGNOSIS — E1122 Type 2 diabetes mellitus with diabetic chronic kidney disease: Secondary | ICD-10-CM | POA: Diagnosis not present

## 2020-02-22 DIAGNOSIS — N189 Chronic kidney disease, unspecified: Secondary | ICD-10-CM | POA: Diagnosis not present

## 2020-02-22 DIAGNOSIS — E1129 Type 2 diabetes mellitus with other diabetic kidney complication: Secondary | ICD-10-CM | POA: Diagnosis not present

## 2020-02-22 DIAGNOSIS — R809 Proteinuria, unspecified: Secondary | ICD-10-CM | POA: Diagnosis not present

## 2020-02-22 DIAGNOSIS — E211 Secondary hyperparathyroidism, not elsewhere classified: Secondary | ICD-10-CM | POA: Diagnosis not present

## 2020-02-22 DIAGNOSIS — D631 Anemia in chronic kidney disease: Secondary | ICD-10-CM | POA: Diagnosis not present

## 2020-03-04 ENCOUNTER — Other Ambulatory Visit: Payer: Self-pay | Admitting: Internal Medicine

## 2020-03-04 ENCOUNTER — Telehealth: Payer: Self-pay | Admitting: Internal Medicine

## 2020-03-04 MED ORDER — APIXABAN 2.5 MG PO TABS: 2.5000 mg | ORAL_TABLET | Freq: Two times a day (BID) | ORAL | 11 refills | Status: DC

## 2020-03-04 NOTE — Telephone Encounter (Signed)
Spoke with pt who request a refill of Eliquis.   Refill complete.

## 2020-03-04 NOTE — Telephone Encounter (Signed)
Pt requesting refill for coumadin/warfin (do not see on list)   Please call [phone]   Thanks renee

## 2020-03-06 ENCOUNTER — Encounter (HOSPITAL_COMMUNITY): Payer: Self-pay | Admitting: *Deleted

## 2020-03-06 ENCOUNTER — Emergency Department (HOSPITAL_COMMUNITY)
Admission: EM | Admit: 2020-03-06 | Discharge: 2020-03-06 | Disposition: A | Discharge status: Home / Self Care | Payer: Medicare Other | Attending: Emergency Medicine | Admitting: Emergency Medicine

## 2020-03-06 ENCOUNTER — Other Ambulatory Visit: Payer: Self-pay

## 2020-03-06 DIAGNOSIS — N183 Chronic kidney disease, stage 3 unspecified: Secondary | ICD-10-CM | POA: Diagnosis not present

## 2020-03-06 DIAGNOSIS — E1122 Type 2 diabetes mellitus with diabetic chronic kidney disease: Secondary | ICD-10-CM | POA: Insufficient documentation

## 2020-03-06 DIAGNOSIS — I129 Hypertensive chronic kidney disease with stage 1 through stage 4 chronic kidney disease, or unspecified chronic kidney disease: Secondary | ICD-10-CM | POA: Insufficient documentation

## 2020-03-06 DIAGNOSIS — R2243 Localized swelling, mass and lump, lower limb, bilateral: Secondary | ICD-10-CM | POA: Diagnosis present

## 2020-03-06 DIAGNOSIS — R600 Localized edema: Secondary | ICD-10-CM | POA: Insufficient documentation

## 2020-03-06 DIAGNOSIS — Z7984 Long term (current) use of oral hypoglycemic drugs: Secondary | ICD-10-CM | POA: Insufficient documentation

## 2020-03-06 DIAGNOSIS — M25571 Pain in right ankle and joints of right foot: Secondary | ICD-10-CM | POA: Diagnosis not present

## 2020-03-06 DIAGNOSIS — Z79899 Other long term (current) drug therapy: Secondary | ICD-10-CM | POA: Insufficient documentation

## 2020-03-06 DIAGNOSIS — R609 Edema, unspecified: Secondary | ICD-10-CM | POA: Diagnosis not present

## 2020-03-06 DIAGNOSIS — R52 Pain, unspecified: Secondary | ICD-10-CM | POA: Diagnosis not present

## 2020-03-06 DIAGNOSIS — R5381 Other malaise: Secondary | ICD-10-CM | POA: Diagnosis not present

## 2020-03-06 LAB — COMPREHENSIVE METABOLIC PANEL
ALT: 26 U/L (ref 0–44)
AST: 41 U/L (ref 15–41)
Albumin: 3.8 g/dL (ref 3.5–5.0)
Alkaline Phosphatase: 37 U/L — ABNORMAL LOW (ref 38–126)
Anion gap: 13 (ref 5–15)
BUN: 40 mg/dL — ABNORMAL HIGH (ref 8–23)
CO2: 23 mmol/L (ref 22–32)
Calcium: 9.2 mg/dL (ref 8.9–10.3)
Chloride: 103 mmol/L (ref 98–111)
Creatinine, Ser: 2.34 mg/dL — ABNORMAL HIGH (ref 0.61–1.24)
GFR calc Af Amer: 28 mL/min — ABNORMAL LOW (ref >60)
GFR calc non Af Amer: 24 mL/min — ABNORMAL LOW (ref >60)
Glucose, Bld: 246 mg/dL — ABNORMAL HIGH (ref 70–99)
Potassium: 3.5 mmol/L (ref 3.5–5.1)
Sodium: 139 mmol/L (ref 135–145)
Total Bilirubin: 1 mg/dL (ref 0.3–1.2)
Total Protein: 7.3 g/dL (ref 6.5–8.1)

## 2020-03-06 LAB — CBC WITH DIFFERENTIAL/PLATELET
Abs Immature Granulocytes: 0.02 10*3/uL (ref 0.00–0.07)
Basophils Absolute: 0 10*3/uL (ref 0.0–0.1)
Basophils Relative: 0 %
Eosinophils Absolute: 0 10*3/uL (ref 0.0–0.5)
Eosinophils Relative: 0 %
HCT: 35.7 % — ABNORMAL LOW (ref 39.0–52.0)
Hemoglobin: 11 g/dL — ABNORMAL LOW (ref 13.0–17.0)
Immature Granulocytes: 0 %
Lymphocytes Relative: 16 %
Lymphs Abs: 0.9 10*3/uL (ref 0.7–4.0)
MCH: 26 pg (ref 26.0–34.0)
MCHC: 30.8 g/dL (ref 30.0–36.0)
MCV: 84.4 fL (ref 80.0–100.0)
Monocytes Absolute: 0.5 10*3/uL (ref 0.1–1.0)
Monocytes Relative: 9 %
Neutro Abs: 4.2 10*3/uL (ref 1.7–7.7)
Neutrophils Relative %: 75 %
Platelets: 179 10*3/uL (ref 150–400)
RBC: 4.23 MIL/uL (ref 4.22–5.81)
RDW: 15.5 % (ref 11.5–15.5)
WBC: 5.7 10*3/uL (ref 4.0–10.5)
nRBC: 0 % (ref 0.0–0.2)

## 2020-03-06 LAB — BRAIN NATRIURETIC PEPTIDE: B Natriuretic Peptide: 275 pg/mL — ABNORMAL HIGH (ref 0.0–100.0)

## 2020-03-06 MED ORDER — FUROSEMIDE 40 MG PO TABS
40.0000 mg | ORAL_TABLET | Freq: Two times a day (BID) | ORAL | 0 refills | Status: DC
Start: 2020-03-06 — End: 2021-01-07

## 2020-03-06 MED ORDER — FUROSEMIDE 40 MG PO TABS: 40.0000 mg | ORAL_TABLET | Freq: Once | ORAL | Status: DC

## 2020-03-06 NOTE — ED Triage Notes (Signed)
Pt with bilateral lower leg edemas right>left.  Pt on blood thinner and on fluid pill.

## 2020-03-06 NOTE — Discharge Instructions (Signed)
Your testing today has been reassuring showing that you do have some extra fluid however increasing the medication called Lasix which you already take may help to get the fluid down.  For the next 2 weeks I would like for you to take 40 mg twice a day, take this with breakfast and with dinner or every 12 hours.  Call your doctor for follow-up within the next 72 hours or return to the emergency department for worsening symptoms such as increasing chest pain or shortness of breath.  If your swelling is worsening or you are becoming more weak return to the ER immediately

## 2020-03-06 NOTE — ED Provider Notes (Signed)
[facility] Provider Note   CSN: [id number] Arrival date & time: 03/06/20  1444     History Chief Complaint  Patient presents with  . Leg Swelling    Kyle Schultz is a 86 y.o. male.  HPI   This patient is an 86 year old male, he has a known history of stage I chronic kidney disease as well as diabetes.  He is known to have heart block and had a pacemaker placed secondary to sinoatrial node dysfunction.  This was in March of 21, the patient currently is complaining of bilateral leg swelling right greater than left despite taking his medications.  He has been on these medications for quite some time including Lasix 20 mg twice a day.  His right leg and ankle feel swollen but there is no pain, no redness, denies fevers, denies coughing or shortness of breath, denies any other symptoms.  He has not seen his family doctor for this.  It seems to be gradually worsening but is mild to moderate at worst.  Medical record reviewed, last echocardiogram showed ejection fraction of 50 to 55%.  There was grade 1 diastolic dysfunction.  Past Medical History:  Diagnosis Date  . CKD (chronic kidney disease), stage I   . Diabetes mellitus   . First degree AV block   . Hyperlipidemia   . Hypertension   . Second degree AV block   . Sinus bradycardia     Patient Active Problem List   Diagnosis Date Noted  . History of colonic polyps 03/20/2019  . Mixed hyperlipidemia 11/03/2017  . Uncontrolled type 2 diabetes mellitus with stage 3 chronic kidney disease ([place]) 08/11/2017  . Type 2 diabetes mellitus with other circulatory complications ([place]) 16/01/3709  . Leukopenia 03/31/2017  . Chest pain 03/31/2017  . SIRS (systemic inflammatory response syndrome) ([place]) 05/31/2016  . Fever 05/31/2016  . Chronic renal insufficiency, stage III (moderate) 06/26/2015  . Palpitations 06/26/2015  . SINOATRIAL NODE DYSFUNCTION 08/01/2009  . Cardiac pacemaker in situ 08/01/2009  . Dyslipidemia  03/10/2009  . Essential hypertension, benign 03/10/2009    Past Surgical History:  Procedure Laterality Date  . COLONOSCOPY  2010   Two 4 mm sessile hepatic flexure polyps, one slightly pedunculated 6 to 8 mm sessile descending colon polyp, small internal hemorrhoids. Simple adenomas.   . colonscopy    . HEMORRHOID SURGERY    . PACEMAKER INSERTION  2010   [facility]  . PPM GENERATOR CHANGEOUT N/A 09/27/2019   Procedure: PPM GENERATOR CHANGEOUT;  Surgeon: Evans Lance, MD;  Location: [place] [facility];  Service: Cardiovascular;  Laterality: N/A;  . UMBILICAL HERNIA REPAIR  04/2008       Family History  Problem Relation Age of Onset  . Heart disease Father   . Cancer Sister   . Cancer Sister   . Cancer Sister   . ALS Sister   . Colon cancer Neg Hx     Social History   Tobacco Use  . Smoking status: Never Smoker  . Smokeless tobacco: Never Used  Vaping Use  . Vaping Use: Never used  Substance Use Topics  . Alcohol use: No    Alcohol/week: 0.0 standard drinks  . Drug use: No    Home Medications Prior to Admission medications   Medication Sig Start Date End Date Taking? Authorizing Provider  apixaban (ELIQUIS) 2.5 MG TABS tablet Take 1 tablet (2.5 mg total) by mouth 2 (two) times daily. 03/04/20   Evans Lance,  MD  carbamide peroxide (DEBROX) 6.5 % OTIC solution Place 5 drops into both ears 2 (two) times daily. 07/05/19   Domenic Moras, PA-C  docusate sodium (COLACE) 100 MG capsule Take 100 mg by mouth daily as needed for mild constipation.    [provider]  fenofibrate 160 MG tablet Take 160 mg by mouth every evening.     [provider]  furosemide (LASIX) 40 MG tablet Take 1 tablet (40 mg total) by mouth 2 (two) times daily for 14 days. 03/06/20 03/20/20  Noemi Chapel, MD  glipiZIDE (GLUCOTROL) 5 MG tablet Take 1 tablet (5 mg total) by mouth 2 (two) times daily with a meal. 07/10/18   Nida, Marella Chimes, MD  glucose blood test strip 1 each by  Other route 2 (two) times daily. Use as instructed 2 x daily. E11.65 EZ MAX 05/01/18   Cassandria Anger, MD  linagliptin (TRADJENTA) 5 MG TABS tablet Take 5 mg by mouth daily.    [provider]  losartan (COZAAR) 100 MG tablet Take 100 mg by mouth daily.  02/13/11   [provider]    Allergies    Other  Review of Systems   Review of Systems  All other systems reviewed and are negative.   Physical Exam Updated Vital Signs BP (!) 153/55 (BP Location: Right Arm)   Pulse 62   Temp 99.3 F (37.4 C) Comment: checked with a differ thermometer  Resp 20   Ht 1.702 m (5\' 7" )   Wt 87.1 kg   SpO2 100%   BMI 30.07 kg/m   Physical Exam Vitals and nursing note reviewed.  Constitutional:      General: He is not in acute distress.    Appearance: He is well-developed.  HENT:     Head: Normocephalic and atraumatic.     Mouth/Throat:     Pharynx: No oropharyngeal exudate.  Eyes:     General: No scleral icterus.       Right eye: No discharge.        Left eye: No discharge.     Conjunctiva/sclera: Conjunctivae normal.     Pupils: Pupils are equal, round, and reactive to light.  Neck:     Thyroid: No thyromegaly.     Vascular: No JVD.  Cardiovascular:     Rate and Rhythm: Normal rate and regular rhythm.     Heart sounds: Normal heart sounds. No murmur heard.  No friction rub. No gallop.   Pulmonary:     Effort: Pulmonary effort is normal. No respiratory distress.     Breath sounds: Normal breath sounds. No wheezing or rales.  Abdominal:     General: Bowel sounds are normal. There is no distension.     Palpations: Abdomen is soft. There is no mass.     Tenderness: There is no abdominal tenderness.  Musculoskeletal:        General: No tenderness. Normal range of motion.     Cervical back: Normal range of motion and neck supple.     Right lower leg: Edema present.     Left lower leg: Edema present.     Comments: The patient's edema extends from the mid tibial  region through the ankles and feet bilaterally the right side seems slightly larger than the left.  Lymphadenopathy:     Cervical: No cervical adenopathy.  Skin:    General: Skin is warm and dry.     Findings: No erythema or rash.  Comments: There is no redness or tenderness to the skin  Neurological:     Mental Status: He is alert.     Coordination: Coordination normal.     Comments: Awake alert and though hard of hearing is able to follow all commands  Psychiatric:        Behavior: Behavior normal.     ED Results / Procedures / Treatments   Labs (all labs ordered are listed, but only abnormal results are displayed) Labs Reviewed  CBC WITH DIFFERENTIAL/PLATELET - Abnormal; Notable for the following components:      Result Value   Hemoglobin 11.0 (*)    HCT 35.7 (*)    All other components within normal limits  COMPREHENSIVE METABOLIC PANEL - Abnormal; Notable for the following components:   Glucose, Bld 246 (*)    BUN 40 (*)    Creatinine, Ser 2.34 (*)    Alkaline Phosphatase 37 (*)    GFR calc non Af Amer 24 (*)    GFR calc Af Amer 28 (*)    All other components within normal limits  BRAIN NATRIURETIC PEPTIDE - Abnormal; Notable for the following components:   B Natriuretic Peptide 275.0 (*)    All other components within normal limits  URINALYSIS, ROUTINE W REFLEX MICROSCOPIC    EKG None  Radiology No results found.  Procedures Procedures (including critical care time)  Medications Ordered in ED Medications  furosemide (LASIX) tablet 40 mg (has no administration in time range)    ED Course  I have reviewed the triage vital signs and the nursing notes.  Pertinent labs & imaging results that were available during my care of the patient were reviewed by me and considered in my medical decision making (see chart for details).    MDM Rules/Calculators/A&P                          This patient has no signs of infection, no urinary symptoms diarrhea  coughing or shortness of breath and there is no rashes.  He has no joint pain only swelling of the bilateral legs.  He is already on Lasix, has a known history of mild heart failure, at this point I think would be prudent to obtain some lab work, make sure that his renal function isn't worsening, obtain an EKG.  The patient is agreeable to the plan  Labs reassuring - Lasix 40mg  tiven here Stable for d/c =- no other sx - no SOB / CP and labs are close to baseline with Cr and lytes etc.  Pt informed of plan - agreeable.  Final Clinical Impression(s) / ED Diagnoses Final diagnoses:  Leg edema    Rx / DC Orders ED Discharge Orders         Ordered    furosemide (LASIX) 40 MG tablet  2 times daily        03/06/20 1856           Noemi Chapel, MD 03/06/20 [phone]

## 2020-03-10 DIAGNOSIS — I10 Essential (primary) hypertension: Secondary | ICD-10-CM | POA: Diagnosis not present

## 2020-03-10 DIAGNOSIS — N1831 Chronic kidney disease, stage 3a: Secondary | ICD-10-CM | POA: Diagnosis not present

## 2020-03-10 DIAGNOSIS — E1165 Type 2 diabetes mellitus with hyperglycemia: Secondary | ICD-10-CM | POA: Diagnosis not present

## 2020-03-10 DIAGNOSIS — R600 Localized edema: Secondary | ICD-10-CM | POA: Diagnosis not present

## 2020-03-25 DIAGNOSIS — E1142 Type 2 diabetes mellitus with diabetic polyneuropathy: Secondary | ICD-10-CM | POA: Diagnosis not present

## 2020-03-25 DIAGNOSIS — B351 Tinea unguium: Secondary | ICD-10-CM | POA: Diagnosis not present

## 2020-03-27 ENCOUNTER — Ambulatory Visit (INDEPENDENT_AMBULATORY_CARE_PROVIDER_SITE_OTHER): Payer: Medicare Other | Admitting: *Deleted

## 2020-03-27 DIAGNOSIS — I442 Atrioventricular block, complete: Secondary | ICD-10-CM | POA: Diagnosis not present

## 2020-03-27 LAB — CUP PACEART REMOTE DEVICE CHECK
Battery Remaining Longevity: 115 mo
Battery Remaining Percentage: 95.5 %
Battery Voltage: 3.01 V
Brady Statistic AP VP Percent: 0 %
Brady Statistic AP VS Percent: 0 %
Brady Statistic AS VP Percent: 0 %
Brady Statistic AS VS Percent: 0 %
Brady Statistic RA Percent Paced: 1 %
Brady Statistic RV Percent Paced: 99 %
Date Time Interrogation Session: 20210916020012
Implantable Lead Implant Date: 20100715
Implantable Lead Implant Date: 20100715
Implantable Lead Location: 753859
Implantable Lead Location: 753860
Implantable Pulse Generator Implant Date: 20210318
Lead Channel Impedance Value: 330 Ohm
Lead Channel Impedance Value: 340 Ohm
Lead Channel Pacing Threshold Amplitude: 0.75 V
Lead Channel Pacing Threshold Pulse Width: 0.5 ms
Lead Channel Sensing Intrinsic Amplitude: 1.7 mV
Lead Channel Sensing Intrinsic Amplitude: 9.5 mV
Lead Channel Setting Pacing Amplitude: 1 V
Lead Channel Setting Pacing Amplitude: 2.5 V
Lead Channel Setting Pacing Pulse Width: 0.5 ms
Lead Channel Setting Sensing Sensitivity: 4 mV
Pulse Gen Model: 2272
Pulse Gen Serial Number: 3803848

## 2020-03-31 DIAGNOSIS — E211 Secondary hyperparathyroidism, not elsewhere classified: Secondary | ICD-10-CM | POA: Diagnosis not present

## 2020-03-31 DIAGNOSIS — I5032 Chronic diastolic (congestive) heart failure: Secondary | ICD-10-CM | POA: Diagnosis not present

## 2020-03-31 DIAGNOSIS — D631 Anemia in chronic kidney disease: Secondary | ICD-10-CM | POA: Diagnosis not present

## 2020-03-31 DIAGNOSIS — R809 Proteinuria, unspecified: Secondary | ICD-10-CM | POA: Diagnosis not present

## 2020-03-31 DIAGNOSIS — E1122 Type 2 diabetes mellitus with diabetic chronic kidney disease: Secondary | ICD-10-CM | POA: Diagnosis not present

## 2020-03-31 DIAGNOSIS — N189 Chronic kidney disease, unspecified: Secondary | ICD-10-CM | POA: Diagnosis not present

## 2020-03-31 DIAGNOSIS — E1129 Type 2 diabetes mellitus with other diabetic kidney complication: Secondary | ICD-10-CM | POA: Diagnosis not present

## 2020-03-31 NOTE — Progress Notes (Signed)
Remote pacemaker transmission.   

## 2020-04-10 DIAGNOSIS — E785 Hyperlipidemia, unspecified: Secondary | ICD-10-CM | POA: Diagnosis not present

## 2020-04-10 DIAGNOSIS — E1165 Type 2 diabetes mellitus with hyperglycemia: Secondary | ICD-10-CM | POA: Diagnosis not present

## 2020-04-11 DIAGNOSIS — Z23 Encounter for immunization: Secondary | ICD-10-CM | POA: Diagnosis not present

## 2020-05-07 DIAGNOSIS — I5032 Chronic diastolic (congestive) heart failure: Secondary | ICD-10-CM | POA: Diagnosis not present

## 2020-05-07 DIAGNOSIS — E1122 Type 2 diabetes mellitus with diabetic chronic kidney disease: Secondary | ICD-10-CM | POA: Diagnosis not present

## 2020-05-07 DIAGNOSIS — D631 Anemia in chronic kidney disease: Secondary | ICD-10-CM | POA: Diagnosis not present

## 2020-05-07 DIAGNOSIS — E211 Secondary hyperparathyroidism, not elsewhere classified: Secondary | ICD-10-CM | POA: Diagnosis not present

## 2020-05-07 DIAGNOSIS — N189 Chronic kidney disease, unspecified: Secondary | ICD-10-CM | POA: Diagnosis not present

## 2020-05-10 DIAGNOSIS — I10 Essential (primary) hypertension: Secondary | ICD-10-CM | POA: Diagnosis not present

## 2020-05-10 DIAGNOSIS — N1831 Chronic kidney disease, stage 3a: Secondary | ICD-10-CM | POA: Diagnosis not present

## 2020-05-13 DIAGNOSIS — I10 Essential (primary) hypertension: Secondary | ICD-10-CM | POA: Diagnosis not present

## 2020-05-13 DIAGNOSIS — N1831 Chronic kidney disease, stage 3a: Secondary | ICD-10-CM | POA: Diagnosis not present

## 2020-05-13 DIAGNOSIS — E1165 Type 2 diabetes mellitus with hyperglycemia: Secondary | ICD-10-CM | POA: Diagnosis not present

## 2020-05-13 DIAGNOSIS — Z950 Presence of cardiac pacemaker: Secondary | ICD-10-CM | POA: Diagnosis not present

## 2020-05-14 DIAGNOSIS — N1831 Chronic kidney disease, stage 3a: Secondary | ICD-10-CM | POA: Diagnosis not present

## 2020-05-14 DIAGNOSIS — E1165 Type 2 diabetes mellitus with hyperglycemia: Secondary | ICD-10-CM | POA: Diagnosis not present

## 2020-05-14 DIAGNOSIS — I10 Essential (primary) hypertension: Secondary | ICD-10-CM | POA: Diagnosis not present

## 2020-06-03 DIAGNOSIS — B351 Tinea unguium: Secondary | ICD-10-CM | POA: Diagnosis not present

## 2020-06-03 DIAGNOSIS — E1142 Type 2 diabetes mellitus with diabetic polyneuropathy: Secondary | ICD-10-CM | POA: Diagnosis not present

## 2020-06-12 DIAGNOSIS — N1831 Chronic kidney disease, stage 3a: Secondary | ICD-10-CM | POA: Diagnosis not present

## 2020-06-12 DIAGNOSIS — I10 Essential (primary) hypertension: Secondary | ICD-10-CM | POA: Diagnosis not present

## 2020-06-26 ENCOUNTER — Ambulatory Visit (INDEPENDENT_AMBULATORY_CARE_PROVIDER_SITE_OTHER): Payer: Medicare Other

## 2020-06-26 DIAGNOSIS — I442 Atrioventricular block, complete: Secondary | ICD-10-CM | POA: Diagnosis not present

## 2020-06-26 LAB — CUP PACEART REMOTE DEVICE CHECK
Battery Remaining Longevity: 114 mo
Battery Remaining Percentage: 95.5 %
Battery Voltage: 3.01 V
Brady Statistic AP VP Percent: 0 %
Brady Statistic AP VS Percent: 0 %
Brady Statistic AS VP Percent: 0 %
Brady Statistic AS VS Percent: 0 %
Brady Statistic RA Percent Paced: 1 %
Brady Statistic RV Percent Paced: 99 %
Date Time Interrogation Session: 20211216020014
Implantable Lead Implant Date: 20100715
Implantable Lead Implant Date: 20100715
Implantable Lead Location: 753859
Implantable Lead Location: 753860
Implantable Pulse Generator Implant Date: 20210318
Lead Channel Impedance Value: 350 Ohm
Lead Channel Impedance Value: 350 Ohm
Lead Channel Pacing Threshold Amplitude: 0.625 V
Lead Channel Pacing Threshold Pulse Width: 0.5 ms
Lead Channel Sensing Intrinsic Amplitude: 1.7 mV
Lead Channel Sensing Intrinsic Amplitude: 7.2 mV
Lead Channel Setting Pacing Amplitude: 0.875
Lead Channel Setting Pacing Amplitude: 2.5 V
Lead Channel Setting Pacing Pulse Width: 0.5 ms
Lead Channel Setting Sensing Sensitivity: 4 mV
Pulse Gen Model: 2272
Pulse Gen Serial Number: 3803848

## 2020-07-03 DIAGNOSIS — I5032 Chronic diastolic (congestive) heart failure: Secondary | ICD-10-CM | POA: Diagnosis not present

## 2020-07-03 DIAGNOSIS — N189 Chronic kidney disease, unspecified: Secondary | ICD-10-CM | POA: Diagnosis not present

## 2020-07-03 DIAGNOSIS — E1122 Type 2 diabetes mellitus with diabetic chronic kidney disease: Secondary | ICD-10-CM | POA: Diagnosis not present

## 2020-07-03 DIAGNOSIS — E211 Secondary hyperparathyroidism, not elsewhere classified: Secondary | ICD-10-CM | POA: Diagnosis not present

## 2020-07-03 DIAGNOSIS — D631 Anemia in chronic kidney disease: Secondary | ICD-10-CM | POA: Diagnosis not present

## 2020-07-10 DIAGNOSIS — E1122 Type 2 diabetes mellitus with diabetic chronic kidney disease: Secondary | ICD-10-CM | POA: Diagnosis not present

## 2020-07-10 DIAGNOSIS — N189 Chronic kidney disease, unspecified: Secondary | ICD-10-CM | POA: Diagnosis not present

## 2020-07-10 DIAGNOSIS — I952 Hypotension due to drugs: Secondary | ICD-10-CM | POA: Diagnosis not present

## 2020-07-10 DIAGNOSIS — D631 Anemia in chronic kidney disease: Secondary | ICD-10-CM | POA: Diagnosis not present

## 2020-07-10 DIAGNOSIS — I5032 Chronic diastolic (congestive) heart failure: Secondary | ICD-10-CM | POA: Diagnosis not present

## 2020-07-10 DIAGNOSIS — E211 Secondary hyperparathyroidism, not elsewhere classified: Secondary | ICD-10-CM | POA: Diagnosis not present

## 2020-07-10 NOTE — Progress Notes (Signed)
Remote pacemaker transmission.   

## 2020-07-21 DIAGNOSIS — E785 Hyperlipidemia, unspecified: Secondary | ICD-10-CM | POA: Diagnosis not present

## 2020-07-21 DIAGNOSIS — N1831 Chronic kidney disease, stage 3a: Secondary | ICD-10-CM | POA: Diagnosis not present

## 2020-08-01 ENCOUNTER — Telehealth: Payer: Self-pay | Admitting: Internal Medicine

## 2020-08-01 ENCOUNTER — Telehealth: Payer: Self-pay

## 2020-08-01 NOTE — Telephone Encounter (Signed)
Pt c/o medication issue:  1. Name of Medication:  apixaban (ELIQUIS) 2.5 MG TABS tablet  2. How are you currently taking this medication (dosage and times per day)?  2 times daily  3. Are you having a reaction (difficulty breathing--STAT)?  No  4. What is your medication issue?  Patient called said he is unable to afford $588.29 for this medication. He normally gets filled at [facility].

## 2020-08-01 NOTE — Telephone Encounter (Signed)
Patient requested samples of Eliquis 2.5 mg #28 Lot#: [id number] Exp:07/2021

## 2020-08-01 NOTE — Telephone Encounter (Signed)
**Note De-Identified  Obfuscation** I called the pt and he states that prior to today he was paying around $60 for a 30 day supply of Eliquis and that now it is $588.29 and he cannot afford that. He is not sure if he has a deductible or not.  I did mention him applying for assistance through [employer] and he asked me to discuss with his grandson Konrad Dolores who lives with him and I agreed.  Tommy came to the phone and we discussed the pts options concerning the pts Eliquis cost.  1. Calling the pts ins provider to find out if the pt has a deductible, how much it is, and if he can afford to pay that his cost for Eliquis will go back down to normal cost. 2. We discussed him calling [employer] to ask questions concerning the pts eligibility to be approved for their Eliquis program and that if it seems that the pt is eligible to request that they mail them an application to their home. He is aware to complete the pts part of the application, obtain required documents per [employer], and to bring all to our [place] office to drop off and that we will handle the provider page and fax all to [employer].. 3.  We discussed Warfarin if the pt does not apply or is denied.  Konrad Dolores thanked me for calling him and is aware to call our [place] office if he has questions or concerns.

## 2020-08-01 NOTE — Telephone Encounter (Signed)
Patient coming into the [place] office to pick up an application. Will f/u in a few days

## 2020-08-18 ENCOUNTER — Telehealth: Payer: Self-pay | Admitting: Internal Medicine

## 2020-08-18 NOTE — Telephone Encounter (Signed)
Pt c/o medication issue:  1. Name of Medication: Eliquis 2.5 mg  2. How are you currently taking this medication (dosage and times per day)? As prescribed  3. Are you having a reaction (difficulty breathing--STAT)? Problem with cost..  4. What is your medication issue? Too expensive. Patient in donut hole with Medicare.

## 2020-08-18 NOTE — Telephone Encounter (Signed)
Pt given 2 sample boxes to Eliquis 2.5 mg Lot [id number], EXP: Jan /2023.

## 2020-08-18 NOTE — Telephone Encounter (Signed)
Pt went to the pharmacy to pick up his apixaban (ELIQUIS) 2.5 MG TABS tablet [id number]  Cause he's out and took the last one this morning and he was told it was going to be $500   Please give pt a call [phone]

## 2020-08-18 NOTE — Telephone Encounter (Signed)
Called and spoke with Kyle Schultz and nephew, Kyle Schultz. Kyle Schultz has stated that he has filled out PAF for Eliquis and is waiting on a decision to be made. He stated that he would rather start Warfarin as previously noted from our last phone call and would like Dr. Tanna Furry advice on this. He is aware that if he is approved for the Eliquis PAF, he will still have to meet his OOP deductible.

## 2020-08-20 DIAGNOSIS — E1142 Type 2 diabetes mellitus with diabetic polyneuropathy: Secondary | ICD-10-CM | POA: Diagnosis not present

## 2020-08-20 DIAGNOSIS — B351 Tinea unguium: Secondary | ICD-10-CM | POA: Diagnosis not present

## 2020-08-21 DIAGNOSIS — E1165 Type 2 diabetes mellitus with hyperglycemia: Secondary | ICD-10-CM | POA: Diagnosis not present

## 2020-08-21 DIAGNOSIS — I10 Essential (primary) hypertension: Secondary | ICD-10-CM | POA: Diagnosis not present

## 2020-09-09 DIAGNOSIS — N189 Chronic kidney disease, unspecified: Secondary | ICD-10-CM | POA: Diagnosis not present

## 2020-09-09 DIAGNOSIS — I5032 Chronic diastolic (congestive) heart failure: Secondary | ICD-10-CM | POA: Diagnosis not present

## 2020-09-09 DIAGNOSIS — R2231 Localized swelling, mass and lump, right upper limb: Secondary | ICD-10-CM | POA: Diagnosis not present

## 2020-09-09 DIAGNOSIS — D631 Anemia in chronic kidney disease: Secondary | ICD-10-CM | POA: Diagnosis not present

## 2020-09-09 DIAGNOSIS — E211 Secondary hyperparathyroidism, not elsewhere classified: Secondary | ICD-10-CM | POA: Diagnosis not present

## 2020-09-09 DIAGNOSIS — E1122 Type 2 diabetes mellitus with diabetic chronic kidney disease: Secondary | ICD-10-CM | POA: Diagnosis not present

## 2020-09-11 ENCOUNTER — Ambulatory Visit (HOSPITAL_COMMUNITY)
Admission: RE | Admit: 2020-09-11 | Discharge: 2020-09-11 | Disposition: A | Discharge status: Home / Self Care | Payer: Medicare HMO | Source: Ambulatory Visit | Attending: Gerontology | Admitting: Gerontology

## 2020-09-11 ENCOUNTER — Other Ambulatory Visit: Payer: Self-pay

## 2020-09-11 ENCOUNTER — Other Ambulatory Visit (HOSPITAL_COMMUNITY): Payer: Self-pay | Admitting: Gerontology

## 2020-09-11 DIAGNOSIS — M79641 Pain in right hand: Secondary | ICD-10-CM | POA: Diagnosis not present

## 2020-09-11 DIAGNOSIS — E1165 Type 2 diabetes mellitus with hyperglycemia: Secondary | ICD-10-CM | POA: Diagnosis not present

## 2020-09-11 DIAGNOSIS — I10 Essential (primary) hypertension: Secondary | ICD-10-CM | POA: Diagnosis not present

## 2020-09-11 DIAGNOSIS — R600 Localized edema: Secondary | ICD-10-CM | POA: Diagnosis not present

## 2020-09-11 DIAGNOSIS — R609 Edema, unspecified: Secondary | ICD-10-CM

## 2020-09-12 ENCOUNTER — Other Ambulatory Visit: Payer: Self-pay | Admitting: Nephrology

## 2020-09-12 ENCOUNTER — Other Ambulatory Visit (HOSPITAL_COMMUNITY): Payer: Self-pay | Admitting: Nephrology

## 2020-09-12 ENCOUNTER — Ambulatory Visit (HOSPITAL_COMMUNITY)
Admission: RE | Admit: 2020-09-12 | Discharge: 2020-09-12 | Disposition: A | Discharge status: Home / Self Care | Payer: Medicare HMO | Source: Ambulatory Visit | Attending: Nephrology | Admitting: Nephrology

## 2020-09-12 DIAGNOSIS — D631 Anemia in chronic kidney disease: Secondary | ICD-10-CM | POA: Diagnosis not present

## 2020-09-12 DIAGNOSIS — R2231 Localized swelling, mass and lump, right upper limb: Secondary | ICD-10-CM | POA: Diagnosis not present

## 2020-09-12 DIAGNOSIS — I5032 Chronic diastolic (congestive) heart failure: Secondary | ICD-10-CM | POA: Diagnosis not present

## 2020-09-12 DIAGNOSIS — R600 Localized edema: Secondary | ICD-10-CM | POA: Diagnosis not present

## 2020-09-12 DIAGNOSIS — N189 Chronic kidney disease, unspecified: Secondary | ICD-10-CM | POA: Diagnosis not present

## 2020-09-12 DIAGNOSIS — E211 Secondary hyperparathyroidism, not elsewhere classified: Secondary | ICD-10-CM | POA: Diagnosis not present

## 2020-09-12 DIAGNOSIS — E1122 Type 2 diabetes mellitus with diabetic chronic kidney disease: Secondary | ICD-10-CM | POA: Diagnosis not present

## 2020-09-25 ENCOUNTER — Ambulatory Visit (INDEPENDENT_AMBULATORY_CARE_PROVIDER_SITE_OTHER): Payer: Medicare HMO

## 2020-09-25 DIAGNOSIS — E1159 Type 2 diabetes mellitus with other circulatory complications: Secondary | ICD-10-CM

## 2020-09-25 LAB — CUP PACEART REMOTE DEVICE CHECK
Battery Remaining Longevity: 113 mo
Battery Remaining Percentage: 95.5 %
Battery Voltage: 3.01 V
Brady Statistic AP VP Percent: 0 %
Brady Statistic AP VS Percent: 0 %
Brady Statistic AS VP Percent: 0 %
Brady Statistic AS VS Percent: 0 %
Brady Statistic RA Percent Paced: 1 %
Brady Statistic RV Percent Paced: 99 %
Date Time Interrogation Session: 20220317020013
Implantable Lead Implant Date: 20100715
Implantable Lead Implant Date: 20100715
Implantable Lead Location: 753859
Implantable Lead Location: 753860
Implantable Pulse Generator Implant Date: 20210318
Lead Channel Impedance Value: 350 Ohm
Lead Channel Impedance Value: 380 Ohm
Lead Channel Pacing Threshold Amplitude: 0.75 V
Lead Channel Pacing Threshold Pulse Width: 0.5 ms
Lead Channel Sensing Intrinsic Amplitude: 1.7 mV
Lead Channel Sensing Intrinsic Amplitude: 12 mV
Lead Channel Setting Pacing Amplitude: 1 V
Lead Channel Setting Pacing Amplitude: 2.5 V
Lead Channel Setting Pacing Pulse Width: 0.5 ms
Lead Channel Setting Sensing Sensitivity: 4 mV
Pulse Gen Model: 2272
Pulse Gen Serial Number: 3803848

## 2020-09-30 DIAGNOSIS — E785 Hyperlipidemia, unspecified: Secondary | ICD-10-CM | POA: Diagnosis not present

## 2020-09-30 DIAGNOSIS — R609 Edema, unspecified: Secondary | ICD-10-CM | POA: Diagnosis not present

## 2020-09-30 DIAGNOSIS — I2510 Atherosclerotic heart disease of native coronary artery without angina pectoris: Secondary | ICD-10-CM | POA: Diagnosis not present

## 2020-09-30 DIAGNOSIS — G8929 Other chronic pain: Secondary | ICD-10-CM | POA: Diagnosis not present

## 2020-09-30 DIAGNOSIS — E1165 Type 2 diabetes mellitus with hyperglycemia: Secondary | ICD-10-CM | POA: Diagnosis not present

## 2020-09-30 DIAGNOSIS — Z8249 Family history of ischemic heart disease and other diseases of the circulatory system: Secondary | ICD-10-CM | POA: Diagnosis not present

## 2020-09-30 DIAGNOSIS — Z809 Family history of malignant neoplasm, unspecified: Secondary | ICD-10-CM | POA: Diagnosis not present

## 2020-09-30 DIAGNOSIS — Z7901 Long term (current) use of anticoagulants: Secondary | ICD-10-CM | POA: Diagnosis not present

## 2020-09-30 DIAGNOSIS — Z7984 Long term (current) use of oral hypoglycemic drugs: Secondary | ICD-10-CM | POA: Diagnosis not present

## 2020-09-30 DIAGNOSIS — Z833 Family history of diabetes mellitus: Secondary | ICD-10-CM | POA: Diagnosis not present

## 2020-10-03 NOTE — Progress Notes (Signed)
Remote pacemaker transmission.   

## 2020-10-22 ENCOUNTER — Other Ambulatory Visit: Payer: Self-pay

## 2020-10-28 ENCOUNTER — Ambulatory Visit (INDEPENDENT_AMBULATORY_CARE_PROVIDER_SITE_OTHER): Payer: Medicare Other | Admitting: Internal Medicine

## 2020-10-28 ENCOUNTER — Encounter: Payer: Self-pay | Admitting: Internal Medicine

## 2020-10-28 ENCOUNTER — Other Ambulatory Visit: Payer: Self-pay

## 2020-10-28 VITALS — BP 116/60 | HR 64 | Ht 71.0 in | Wt 191.0 lb

## 2020-10-28 DIAGNOSIS — I442 Atrioventricular block, complete: Secondary | ICD-10-CM | POA: Diagnosis not present

## 2020-10-28 DIAGNOSIS — I495 Sick sinus syndrome: Secondary | ICD-10-CM | POA: Diagnosis not present

## 2020-10-28 MED ORDER — LOSARTAN POTASSIUM 50 MG PO TABS: 50.0000 mg | ORAL_TABLET | Freq: Every day | ORAL | 11 refills | Status: DC

## 2020-10-28 MED ORDER — APIXABAN 2.5 MG PO TABS: 2.5000 mg | ORAL_TABLET | Freq: Two times a day (BID) | ORAL | 11 refills | Status: DC

## 2020-10-28 NOTE — Progress Notes (Signed)
HPI Kyle Schultz returns today for followup of CHB and persistent atrial fib and flutter and diastolic CHF. He has a h/o HTN and persistent atypical atrial flutter. He has a h/o non-compliance and difficulty with his rides. He has not had syncope. He notes that he done well since his last visit. He has not had chest pain. He notes some leg swelling.  Allergies  Allergen Reactions  . Other     "some kind of fluid pill I can't take"      Current Outpatient Medications  Medication Sig Dispense Refill  . apixaban (ELIQUIS) 2.5 MG TABS tablet Take 1 tablet (2.5 mg total) by mouth 2 (two) times daily. 60 tablet 11  . carbamide peroxide (DEBROX) 6.5 % OTIC solution Place 5 drops into both ears 2 (two) times daily. 15 mL 0  . docusate sodium (COLACE) 100 MG capsule Take 100 mg by mouth daily as needed for mild constipation.    . furosemide (LASIX) 40 MG tablet Take 1 tablet (40 mg total) by mouth 2 (two) times daily for 14 days. 28 tablet 0  . glipiZIDE (GLUCOTROL) 5 MG tablet Take 1 tablet (5 mg total) by mouth 2 (two) times daily with a meal. 60 tablet 3  . linagliptin (TRADJENTA) 5 MG TABS tablet Take 5 mg by mouth daily.    Marland Kitchen losartan (COZAAR) 50 MG tablet Take 50 mg by mouth daily.    . simvastatin (ZOCOR) 20 MG tablet Take 20 mg by mouth at bedtime.    Marland Kitchen glucose blood test strip 1 each by Other route 2 (two) times daily. Use as instructed 2 x daily. E11.65 EZ MAX 100 each 5   No current facility-administered medications for this visit.     Past Medical History:  Diagnosis Date  . CKD (chronic kidney disease), stage I   . Diabetes mellitus   . First degree AV block   . Hyperlipidemia   . Hypertension   . Second degree AV block   . Sinus bradycardia     ROS:   All systems reviewed and negative except as noted in the HPI.   Past Surgical History:  Procedure Laterality Date  . COLONOSCOPY  2010   Two 4 mm sessile hepatic flexure polyps, one slightly pedunculated 6 to 8 mm  sessile descending colon polyp, small internal hemorrhoids. Simple adenomas.   . colonscopy    . HEMORRHOID SURGERY    . PACEMAKER INSERTION  2010   [facility]  . PPM GENERATOR CHANGEOUT N/A 09/27/2019   Procedure: PPM GENERATOR CHANGEOUT;  Surgeon: Evans Lance, MD;  Location: [facility];  Service: Cardiovascular;  Laterality: N/A;  . UMBILICAL HERNIA REPAIR  04/2008     Family History  Problem Relation Age of Onset  . Heart disease Father   . Cancer Sister   . Cancer Sister   . Cancer Sister   . ALS Sister   . Colon cancer Neg Hx      Social History   Socioeconomic History  . Marital status: Widowed    Spouse name: Not on file  . Number of children: Not on file  . Years of education: Not on file  . Highest education level: Not on file  Occupational History  . Occupation: Retired, Optometrist Tobacco  Tobacco Use  . Smoking status: Never Smoker  . Smokeless tobacco: Never Used  Vaping Use  . Vaping Use: Never used  Substance and Sexual Activity  .  Alcohol use: No    Alcohol/week: 0.0 standard drinks  . Drug use: No  . Sexual activity: Never    Birth control/protection: Abstinence  Other Topics Concern  . Not on file  Social History Narrative   Widowed   1 child   Walks daily   Social Determinants of Health   Financial Resource Strain: Not on file  Food Insecurity: Not on file  Transportation Needs: Not on file  Physical Activity: Not on file  Stress: Not on file  Social Connections: Not on file  Intimate Partner Violence: Not on file     BP 116/60   Pulse 64   Ht '5\' 11"'$  (1.803 m)   Wt 191 lb (86.6 kg)   BMI 26.64 kg/m   Physical Exam:  Well appearing 56 you man, NAD HEENT: Unremarkable Neck:  6 cm JVD, no thyromegally Lymphatics:  No adenopathy Back:  No CVA tenderness Lungs:  Clear with no wheezes HEART:  Regular rate rhythm, no murmurs, no rubs, no clicks Abd:  soft, positive bowel sounds, no organomegally, no rebound, no  guarding Ext:  2 plus pulses, no edema, no cyanosis, no clubbing Skin:  No rashes no nodules Neuro:  CN II through XII intact, motor grossly intact   DEVICE  Normal device function.  See PaceArt for details.   Assess/Plan: 1. CHB - he is asymptomatic, s/p PPM insertion. 2. PPM - his [employer] DDD PM is working normally. 3. HTN - his bp is controlled. 4. Atrial fib/flutter - his VR is well controlled. No change in his meds.  Carleene Overlie Jurnie Garritano,MD

## 2020-10-28 NOTE — Patient Instructions (Addendum)
Medication Instructions:  Your physician recommends that you continue on your current medications as directed. Please refer to the Current Medication list given to you today.  *If you need a refill on your cardiac medications before your next appointment, please call your pharmacy*   Lab Work: None today If you have labs (blood work) drawn today and your tests are completely normal, you will receive your results only by: Marland Kitchen MyChart Message (if you have MyChart) OR . A paper copy in the mail If you have any lab test that is abnormal or we need to change your treatment, we will call you to review the results.   Testing/Procedures: None today   Follow-Up: At [facility], you and your health needs are our priority.  As part of our continuing mission to provide you with exceptional heart care, we have created designated Provider Care Teams.  These Care Teams include your primary Cardiologist (physician) and Advanced Practice Providers (APPs -  Physician Assistants and Nurse Practitioners) who all work together to provide you with the care you need, when you need it.  We recommend signing up for the patient portal called "MyChart".  Sign up information is provided on this After Visit Summary.  MyChart is used to connect with patients for Virtual Visits (Telemedicine).  Patients are able to view lab/test results, encounter notes, upcoming appointments, etc.  Non-urgent messages can be sent to your provider as well.   To learn more about what you can do with MyChart, go to [email or web address].    Your next appointment:   12 month(s)  The format for your next appointment:   In Person  Provider:    Crissie Sickles, M.D.   Other Instructions None    Eliquis Samples (#56) Lot #[id number], [id number] Exp: 10/2021, 07/2021

## 2020-11-25 DIAGNOSIS — Z950 Presence of cardiac pacemaker: Secondary | ICD-10-CM | POA: Diagnosis not present

## 2020-11-25 DIAGNOSIS — N1831 Chronic kidney disease, stage 3a: Secondary | ICD-10-CM | POA: Diagnosis not present

## 2020-11-25 DIAGNOSIS — Z1389 Encounter for screening for other disorder: Secondary | ICD-10-CM | POA: Diagnosis not present

## 2020-11-25 DIAGNOSIS — Z0001 Encounter for general adult medical examination with abnormal findings: Secondary | ICD-10-CM | POA: Diagnosis not present

## 2020-11-25 DIAGNOSIS — Z1331 Encounter for screening for depression: Secondary | ICD-10-CM | POA: Diagnosis not present

## 2020-11-25 DIAGNOSIS — I442 Atrioventricular block, complete: Secondary | ICD-10-CM | POA: Diagnosis not present

## 2020-11-25 DIAGNOSIS — I10 Essential (primary) hypertension: Secondary | ICD-10-CM | POA: Diagnosis not present

## 2020-11-25 DIAGNOSIS — E1165 Type 2 diabetes mellitus with hyperglycemia: Secondary | ICD-10-CM | POA: Diagnosis not present

## 2020-12-25 ENCOUNTER — Ambulatory Visit (INDEPENDENT_AMBULATORY_CARE_PROVIDER_SITE_OTHER): Payer: Medicare Other

## 2020-12-25 DIAGNOSIS — I442 Atrioventricular block, complete: Secondary | ICD-10-CM | POA: Diagnosis not present

## 2020-12-25 LAB — CUP PACEART REMOTE DEVICE CHECK
Battery Remaining Longevity: 119 mo
Battery Remaining Percentage: 95.5 %
Battery Voltage: 3.01 V
Brady Statistic AP VP Percent: 0 %
Brady Statistic AP VS Percent: 0 %
Brady Statistic AS VP Percent: 0 %
Brady Statistic AS VS Percent: 0 %
Brady Statistic RA Percent Paced: 1 %
Brady Statistic RV Percent Paced: 98 %
Date Time Interrogation Session: 20220616020013
Implantable Lead Implant Date: 20100715
Implantable Lead Implant Date: 20100715
Implantable Lead Location: 753859
Implantable Lead Location: 753860
Implantable Pulse Generator Implant Date: 20210318
Lead Channel Impedance Value: 330 Ohm
Lead Channel Impedance Value: 330 Ohm
Lead Channel Pacing Threshold Amplitude: 0.625 V
Lead Channel Pacing Threshold Pulse Width: 0.5 ms
Lead Channel Sensing Intrinsic Amplitude: 12 mV
Lead Channel Sensing Intrinsic Amplitude: 2.7 mV
Lead Channel Setting Pacing Amplitude: 0.875
Lead Channel Setting Pacing Amplitude: 2.5 V
Lead Channel Setting Pacing Pulse Width: 0.5 ms
Lead Channel Setting Sensing Sensitivity: 4 mV
Pulse Gen Model: 2272
Pulse Gen Serial Number: 3803848

## 2021-01-02 ENCOUNTER — Emergency Department (HOSPITAL_COMMUNITY): Payer: Medicare Other

## 2021-01-02 ENCOUNTER — Encounter (HOSPITAL_COMMUNITY): Payer: Self-pay | Admitting: *Deleted

## 2021-01-02 ENCOUNTER — Inpatient Hospital Stay (HOSPITAL_COMMUNITY): Payer: Medicare Other

## 2021-01-02 ENCOUNTER — Inpatient Hospital Stay (HOSPITAL_COMMUNITY)
Admission: EM | Admit: 2021-01-02 | Discharge: 2021-01-07 | DRG: 291 | Disposition: A | Discharge status: Skilled Nursing Facility | Payer: Medicare Other | Attending: Family Medicine | Admitting: Family Medicine

## 2021-01-02 DIAGNOSIS — E1159 Type 2 diabetes mellitus with other circulatory complications: Secondary | ICD-10-CM | POA: Diagnosis present

## 2021-01-02 DIAGNOSIS — Z950 Presence of cardiac pacemaker: Secondary | ICD-10-CM

## 2021-01-02 DIAGNOSIS — E1122 Type 2 diabetes mellitus with diabetic chronic kidney disease: Secondary | ICD-10-CM | POA: Diagnosis present

## 2021-01-02 DIAGNOSIS — Z7901 Long term (current) use of anticoagulants: Secondary | ICD-10-CM

## 2021-01-02 DIAGNOSIS — Z20822 Contact with and (suspected) exposure to covid-19: Secondary | ICD-10-CM | POA: Diagnosis present

## 2021-01-02 DIAGNOSIS — F05 Delirium due to known physiological condition: Secondary | ICD-10-CM | POA: Diagnosis not present

## 2021-01-02 DIAGNOSIS — I495 Sick sinus syndrome: Secondary | ICD-10-CM | POA: Diagnosis present

## 2021-01-02 DIAGNOSIS — I4892 Unspecified atrial flutter: Secondary | ICD-10-CM | POA: Diagnosis present

## 2021-01-02 DIAGNOSIS — E785 Hyperlipidemia, unspecified: Secondary | ICD-10-CM | POA: Diagnosis present

## 2021-01-02 DIAGNOSIS — Z8719 Personal history of other diseases of the digestive system: Secondary | ICD-10-CM

## 2021-01-02 DIAGNOSIS — I272 Pulmonary hypertension, unspecified: Secondary | ICD-10-CM | POA: Diagnosis present

## 2021-01-02 DIAGNOSIS — J189 Pneumonia, unspecified organism: Secondary | ICD-10-CM

## 2021-01-02 DIAGNOSIS — I5031 Acute diastolic (congestive) heart failure: Secondary | ICD-10-CM | POA: Diagnosis not present

## 2021-01-02 DIAGNOSIS — Z7984 Long term (current) use of oral hypoglycemic drugs: Secondary | ICD-10-CM

## 2021-01-02 DIAGNOSIS — I5043 Acute on chronic combined systolic (congestive) and diastolic (congestive) heart failure: Secondary | ICD-10-CM | POA: Diagnosis present

## 2021-01-02 DIAGNOSIS — E876 Hypokalemia: Secondary | ICD-10-CM | POA: Diagnosis present

## 2021-01-02 DIAGNOSIS — R262 Difficulty in walking, not elsewhere classified: Secondary | ICD-10-CM | POA: Diagnosis present

## 2021-01-02 DIAGNOSIS — I442 Atrioventricular block, complete: Secondary | ICD-10-CM | POA: Diagnosis present

## 2021-01-02 DIAGNOSIS — I4821 Permanent atrial fibrillation: Secondary | ICD-10-CM | POA: Diagnosis present

## 2021-01-02 DIAGNOSIS — I130 Hypertensive heart and chronic kidney disease with heart failure and stage 1 through stage 4 chronic kidney disease, or unspecified chronic kidney disease: Secondary | ICD-10-CM | POA: Diagnosis not present

## 2021-01-02 DIAGNOSIS — I255 Ischemic cardiomyopathy: Secondary | ICD-10-CM | POA: Diagnosis present

## 2021-01-02 DIAGNOSIS — I10 Essential (primary) hypertension: Secondary | ICD-10-CM | POA: Diagnosis not present

## 2021-01-02 DIAGNOSIS — N179 Acute kidney failure, unspecified: Secondary | ICD-10-CM | POA: Diagnosis not present

## 2021-01-02 DIAGNOSIS — H9190 Unspecified hearing loss, unspecified ear: Secondary | ICD-10-CM | POA: Diagnosis present

## 2021-01-02 DIAGNOSIS — Z9111 Patient's noncompliance with dietary regimen: Secondary | ICD-10-CM

## 2021-01-02 DIAGNOSIS — I482 Chronic atrial fibrillation, unspecified: Secondary | ICD-10-CM | POA: Diagnosis not present

## 2021-01-02 DIAGNOSIS — I5033 Acute on chronic diastolic (congestive) heart failure: Secondary | ICD-10-CM | POA: Insufficient documentation

## 2021-01-02 DIAGNOSIS — Z8249 Family history of ischemic heart disease and other diseases of the circulatory system: Secondary | ICD-10-CM | POA: Diagnosis not present

## 2021-01-02 DIAGNOSIS — I509 Heart failure, unspecified: Secondary | ICD-10-CM

## 2021-01-02 DIAGNOSIS — N1832 Chronic kidney disease, stage 3b: Secondary | ICD-10-CM | POA: Diagnosis not present

## 2021-01-02 DIAGNOSIS — Z79899 Other long term (current) drug therapy: Secondary | ICD-10-CM

## 2021-01-02 DIAGNOSIS — N184 Chronic kidney disease, stage 4 (severe): Secondary | ICD-10-CM | POA: Diagnosis present

## 2021-01-02 DIAGNOSIS — I959 Hypotension, unspecified: Secondary | ICD-10-CM | POA: Diagnosis not present

## 2021-01-02 DIAGNOSIS — N183 Chronic kidney disease, stage 3 unspecified: Secondary | ICD-10-CM | POA: Diagnosis present

## 2021-01-02 DIAGNOSIS — I5023 Acute on chronic systolic (congestive) heart failure: Secondary | ICD-10-CM | POA: Diagnosis not present

## 2021-01-02 DIAGNOSIS — IMO0002 Reserved for concepts with insufficient information to code with codable children: Secondary | ICD-10-CM

## 2021-01-02 DIAGNOSIS — M7989 Other specified soft tissue disorders: Secondary | ICD-10-CM | POA: Diagnosis not present

## 2021-01-02 DIAGNOSIS — F0390 Unspecified dementia without behavioral disturbance: Secondary | ICD-10-CM | POA: Diagnosis present

## 2021-01-02 HISTORY — DX: Cardiomyopathy, unspecified: I42.9

## 2021-01-02 HISTORY — DX: Presence of cardiac pacemaker: Z95.0

## 2021-01-02 HISTORY — DX: Unspecified atrial fibrillation: I48.91

## 2021-01-02 HISTORY — DX: Chronic kidney disease, stage 3 unspecified: N18.30

## 2021-01-02 HISTORY — DX: Atrioventricular block, complete: I44.2

## 2021-01-02 LAB — BLOOD GAS, VENOUS
Acid-Base Excess: 1.8 mmol/L (ref 0.0–2.0)
Bicarbonate: 25.4 mmol/L (ref 20.0–28.0)
FIO2: 21
O2 Saturation: 71 %
Patient temperature: 37.1
pCO2, Ven: 42.7 mmHg — ABNORMAL LOW (ref 44.0–60.0)
pH, Ven: 7.403 (ref 7.250–7.430)
pO2, Ven: 40.9 mmHg (ref 32.0–45.0)

## 2021-01-02 LAB — CBC WITH DIFFERENTIAL/PLATELET
Abs Immature Granulocytes: 0.04 10*3/uL (ref 0.00–0.07)
Basophils Absolute: 0 10*3/uL (ref 0.0–0.1)
Basophils Relative: 1 %
Eosinophils Absolute: 0 10*3/uL (ref 0.0–0.5)
Eosinophils Relative: 0 %
HCT: 32.7 % — ABNORMAL LOW (ref 39.0–52.0)
Hemoglobin: 10.2 g/dL — ABNORMAL LOW (ref 13.0–17.0)
Immature Granulocytes: 1 %
Lymphocytes Relative: 15 %
Lymphs Abs: 0.9 10*3/uL (ref 0.7–4.0)
MCH: 25.9 pg — ABNORMAL LOW (ref 26.0–34.0)
MCHC: 31.2 g/dL (ref 30.0–36.0)
MCV: 83 fL (ref 80.0–100.0)
Monocytes Absolute: 0.6 10*3/uL (ref 0.1–1.0)
Monocytes Relative: 9 %
Neutro Abs: 4.3 10*3/uL (ref 1.7–7.7)
Neutrophils Relative %: 74 %
Platelets: 196 10*3/uL (ref 150–400)
RBC: 3.94 MIL/uL — ABNORMAL LOW (ref 4.22–5.81)
RDW: 15.9 % — ABNORMAL HIGH (ref 11.5–15.5)
WBC: 5.8 10*3/uL (ref 4.0–10.5)
nRBC: 0 % (ref 0.0–0.2)

## 2021-01-02 LAB — HEMOGLOBIN A1C
Hgb A1c MFr Bld: 6.2 % — ABNORMAL HIGH (ref 4.8–5.6)
Mean Plasma Glucose: 131.24 mg/dL

## 2021-01-02 LAB — BASIC METABOLIC PANEL
Anion gap: 7 (ref 5–15)
BUN: 35 mg/dL — ABNORMAL HIGH (ref 8–23)
CO2: 24 mmol/L (ref 22–32)
Calcium: 9 mg/dL (ref 8.9–10.3)
Chloride: 106 mmol/L (ref 98–111)
Creatinine, Ser: 2.34 mg/dL — ABNORMAL HIGH (ref 0.61–1.24)
GFR, Estimated: 26 mL/min — ABNORMAL LOW (ref >60)
Glucose, Bld: 130 mg/dL — ABNORMAL HIGH (ref 70–99)
Potassium: 3.4 mmol/L — ABNORMAL LOW (ref 3.5–5.1)
Sodium: 137 mmol/L (ref 135–145)

## 2021-01-02 LAB — RESP PANEL BY RT-PCR (FLU A&B, COVID) ARPGX2
Influenza A by PCR: NEGATIVE
Influenza B by PCR: NEGATIVE
SARS Coronavirus 2 by RT PCR: NEGATIVE

## 2021-01-02 LAB — TROPONIN I (HIGH SENSITIVITY)
Troponin I (High Sensitivity): 118 ng/L (ref <18)
Troponin I (High Sensitivity): 133 ng/L (ref <18)

## 2021-01-02 LAB — GLUCOSE, CAPILLARY: Glucose-Capillary: 151 mg/dL — ABNORMAL HIGH (ref 70–99)

## 2021-01-02 LAB — BRAIN NATRIURETIC PEPTIDE: B Natriuretic Peptide: 534 pg/mL — ABNORMAL HIGH (ref 0.0–100.0)

## 2021-01-02 MED ORDER — APIXABAN 2.5 MG PO TABS
2.5000 mg | ORAL_TABLET | Freq: Two times a day (BID) | ORAL | Status: DC
  Administered 2021-01-02 – 2021-01-07 (×10): 2.5 mg via ORAL
  Filled 2021-01-02 (×10): qty 1

## 2021-01-02 MED ORDER — SIMVASTATIN 20 MG PO TABS
20.0000 mg | ORAL_TABLET | Freq: Every day | ORAL | Status: DC
  Administered 2021-01-02 – 2021-01-06 (×5): 20 mg via ORAL
  Filled 2021-01-02 (×5): qty 1

## 2021-01-02 MED ORDER — INSULIN ASPART 100 UNIT/ML IJ SOLN
0.0000 [IU] | Freq: Three times a day (TID) | INTRAMUSCULAR | Status: DC
  Administered 2021-01-03: 5 [IU] via SUBCUTANEOUS
  Administered 2021-01-03 – 2021-01-04 (×2): 3 [IU] via SUBCUTANEOUS
  Administered 2021-01-04: 2 [IU] via SUBCUTANEOUS
  Administered 2021-01-04 – 2021-01-05 (×4): 3 [IU] via SUBCUTANEOUS
  Administered 2021-01-06 (×3): 2 [IU] via SUBCUTANEOUS
  Administered 2021-01-07 (×2): 3 [IU] via SUBCUTANEOUS
  Administered 2021-01-07: 2 [IU] via SUBCUTANEOUS

## 2021-01-02 MED ORDER — LOSARTAN POTASSIUM 50 MG PO TABS
50.0000 mg | ORAL_TABLET | Freq: Every day | ORAL | Status: DC
  Administered 2021-01-03 – 2021-01-05 (×3): 50 mg via ORAL
  Filled 2021-01-02 (×4): qty 1

## 2021-01-02 MED ORDER — FUROSEMIDE 10 MG/ML IJ SOLN
40.0000 mg | Freq: Two times a day (BID) | INTRAMUSCULAR | Status: DC
  Administered 2021-01-03 – 2021-01-04 (×3): 40 mg via INTRAVENOUS
  Filled 2021-01-02 (×3): qty 4

## 2021-01-02 MED ORDER — INSULIN ASPART 100 UNIT/ML IJ SOLN
0.0000 [IU] | Freq: Every day | INTRAMUSCULAR | Status: DC
  Administered 2021-01-04: 2 [IU] via SUBCUTANEOUS

## 2021-01-02 MED ORDER — FUROSEMIDE 10 MG/ML IJ SOLN
20.0000 mg | Freq: Once | INTRAMUSCULAR | Status: AC
  Administered 2021-01-02: 20 mg via INTRAVENOUS
  Filled 2021-01-02: qty 2

## 2021-01-02 MED ORDER — SODIUM CHLORIDE 0.9% FLUSH: 3.0000 mL | INTRAVENOUS | Status: DC | PRN

## 2021-01-02 MED ORDER — FUROSEMIDE 10 MG/ML IJ SOLN
40.0000 mg | Freq: Once | INTRAMUSCULAR | Status: AC
  Administered 2021-01-02: 40 mg via INTRAVENOUS
  Filled 2021-01-02: qty 4

## 2021-01-02 MED ORDER — SODIUM CHLORIDE 0.9 % IV SOLN: 250.0000 mL | INTRAVENOUS | Status: DC | PRN

## 2021-01-02 MED ORDER — ONDANSETRON HCL 4 MG/2ML IJ SOLN: 4.0000 mg | Freq: Four times a day (QID) | INTRAMUSCULAR | Status: DC | PRN

## 2021-01-02 MED ORDER — ACETAMINOPHEN 325 MG PO TABS
650.0000 mg | ORAL_TABLET | ORAL | Status: DC | PRN
  Administered 2021-01-05: 650 mg via ORAL
  Filled 2021-01-02: qty 2

## 2021-01-02 MED ORDER — SODIUM CHLORIDE 0.9% FLUSH
3.0000 mL | Freq: Two times a day (BID) | INTRAVENOUS | Status: DC
  Administered 2021-01-02 – 2021-01-06 (×9): 3 mL via INTRAVENOUS

## 2021-01-02 NOTE — ED Notes (Addendum)
Admitting provider at bedside. Pt noted to be more drowsy than usual. PA and admitting provider notified, new orders placed for vbg and head ct

## 2021-01-02 NOTE — ED Triage Notes (Signed)
Bilateral leg swelling, unable to walk x 2 days

## 2021-01-02 NOTE — ED Notes (Signed)
Date and time results received: 01/02/21 3:35 PM  (use smartphrase ".now" to insert current time)  Test: Trop Critical Value: 118  Name of Provider Notified: Tivis Ringer, [place]  Orders Received? Or Actions Taken?: see chart

## 2021-01-02 NOTE — ED Provider Notes (Signed)
[facility] EMERGENCY DEPARTMENT Provider Note   CSN: [id number] Arrival date & time: 01/02/21  1208     History Chief Complaint  Patient presents with   Leg Swelling    Kyle Schultz is a 87 y.o. male.  HPI  87 year old male with a history of CKD, diabetes, first-degree AV block s/p pacemaker, hyperlipidemia, hypertension, who presents to the emergency department today for evaluation of bilateral lower extremity swelling.  He states the swelling is been present for the last 3 to 4 days.  He states he had difficulty ambulating at home due to the amount of swelling and inability to pick up his legs.  He denies any shortness of breath or chest pain.  He states he does not take Lasix daily but has done so in the past intermittently.  He denies any other concerns at this time. he denies any recent cough or fever.  Past Medical History:  Diagnosis Date   CKD (chronic kidney disease), stage I    Diabetes mellitus    First degree AV block    Hyperlipidemia    Hypertension    Second degree AV block    Sinus bradycardia     Patient Active Problem List   Diagnosis Date Noted   Acute on chronic diastolic CHF (congestive heart failure) ([place]) 01/02/2021   History of colonic polyps 03/20/2019   Mixed hyperlipidemia 11/03/2017   Uncontrolled type 2 diabetes mellitus with stage 3 chronic kidney disease ([place]) 08/11/2017   Type 2 diabetes mellitus with other circulatory complications ([place]) [id number]   Leukopenia 03/31/2017   Chest pain 03/31/2017   SIRS (systemic inflammatory response syndrome) ([place]) 05/31/2016   Fever 05/31/2016   Chronic renal insufficiency, stage III (moderate) ([place]) 06/26/2015   Palpitations 06/26/2015   SINOATRIAL NODE DYSFUNCTION 08/01/2009   Cardiac pacemaker in situ 08/01/2009   Dyslipidemia 03/10/2009   Essential hypertension, benign 03/10/2009    Past Surgical History:  Procedure Laterality Date   COLONOSCOPY  2010   Two 4 mm sessile hepatic flexure  polyps, one slightly pedunculated 6 to 8 mm sessile descending colon polyp, small internal hemorrhoids. Simple adenomas.    colonscopy     HEMORRHOID SURGERY     PACEMAKER INSERTION  2 [place] Dr. jude   PPM GENERATOR CHANGEOUT N/A 09/27/2019   Procedure: PPM GENERATOR CHANGEOUT;  Surgeon: Evans Lance, MD;  Location: [facility];  Service: Cardiovascular;  Laterality: N/A;   UMBILICAL HERNIA REPAIR  04/2008       Family History  Problem Relation Age of Onset   Heart disease Father    Cancer Sister    Cancer Sister    Cancer Sister    ALS Sister    Colon cancer Neg Hx     Social History   Tobacco Use   Smoking status: Never   Smokeless tobacco: Never  Vaping Use   Vaping Use: Never used  Substance Use Topics   Alcohol use: No    Alcohol/week: 0.0 standard drinks   Drug use: No    Home Medications Prior to Admission medications   Medication Sig Start Date End Date Taking? Authorizing Provider  apixaban (ELIQUIS) 2.5 MG TABS tablet Take 1 tablet (2.5 mg total) by mouth 2 (two) times daily. 10/28/20  Yes Evans Lance, MD  carbamide peroxide (DEBROX) 6.5 % OTIC solution Place 5 drops into both ears 2 (two) times daily. 07/05/19  Yes Domenic Moras, PA-C  docusate sodium (COLACE) 100 MG capsule Take  100 mg by mouth daily as needed for mild constipation.   Yes [provider]  furosemide (LASIX) 40 MG tablet Take 1 tablet (40 mg total) by mouth 2 (two) times daily for 14 days. 03/06/20 01/02/21 Yes Noemi Chapel, MD  glipiZIDE (GLUCOTROL) 5 MG tablet Take 1 tablet (5 mg total) by mouth 2 (two) times daily with a meal. 07/10/18  Yes Nida, Marella Chimes, MD  linagliptin (TRADJENTA) 5 MG TABS tablet Take 5 mg by mouth daily.   Yes [provider]  losartan (COZAAR) 50 MG tablet Take 1 tablet (50 mg total) by mouth daily. 10/28/20 10/28/21 Yes Evans Lance, MD  simvastatin (ZOCOR) 20 MG tablet Take 20 mg by mouth at bedtime. 10/04/20  Yes [provider]  glucose blood test strip 1 each by Other route 2 (two) times daily. Use as instructed 2 x daily. E11.65 EZ MAX 05/01/18   Cassandria Anger, MD  simvastatin (ZOCOR) 10 MG tablet Take by mouth. Patient not taking: Reported on 01/02/2021    [provider]    Allergies    Other  Review of Systems   Review of Systems  Constitutional:  Negative for chills and fever.  HENT:  Negative for ear pain and sore throat.   Eyes:  Negative for visual disturbance.  Respiratory:  Negative for cough and shortness of breath.   Cardiovascular:  Positive for leg swelling. Negative for chest pain.  Gastrointestinal:  Negative for abdominal pain, constipation, diarrhea, nausea and vomiting.  Genitourinary:  Negative for dysuria and hematuria.  Musculoskeletal:  Negative for back pain.  Skin:  Negative for rash.  Neurological:  Negative for seizures and syncope.  All other systems reviewed and are negative.  Physical Exam Updated Vital Signs BP (!) 154/71   Pulse (!) 59   Temp 98.7 F (37.1 C)   Resp 17   SpO2 100%   Physical Exam Vitals and nursing note reviewed.  Constitutional:      Appearance: He is well-developed.  HENT:     Head: Normocephalic and atraumatic.  Eyes:     Conjunctiva/sclera: Conjunctivae normal.  Cardiovascular:     Rate and Rhythm: Normal rate and regular rhythm.     Heart sounds: Normal heart sounds. No murmur heard. Pulmonary:     Effort: Pulmonary effort is normal. No respiratory distress.     Breath sounds: Normal breath sounds.  Abdominal:     General: Bowel sounds are normal.     Palpations: Abdomen is soft.     Tenderness: There is no abdominal tenderness. There is no guarding or rebound.  Musculoskeletal:        General: No tenderness.     Cervical back: Neck supple.     Right lower leg: Edema present.     Left lower leg: Edema present.  Skin:    General: Skin is warm and dry.  Neurological:     Mental Status: He is alert.    ED  Results / Procedures / Treatments   Labs (all labs ordered are listed, but only abnormal results are displayed) Labs Reviewed  BASIC METABOLIC PANEL - Abnormal; Notable for the following components:      Result Value   Potassium 3.4 (*)    Glucose, Bld 130 (*)    BUN 35 (*)    Creatinine, Ser 2.34 (*)    GFR, Estimated 26 (*)    All other components within normal limits  BRAIN NATRIURETIC PEPTIDE - Abnormal; Notable  for the following components:   B Natriuretic Peptide 534.0 (*)    All other components within normal limits  CBC WITH DIFFERENTIAL/PLATELET - Abnormal; Notable for the following components:   RBC 3.94 (*)    Hemoglobin 10.2 (*)    HCT 32.7 (*)    MCH 25.9 (*)    RDW 15.9 (*)    All other components within normal limits  BLOOD GAS, VENOUS - Abnormal; Notable for the following components:   pCO2, Ven 42.7 (*)    All other components within normal limits  TROPONIN I (HIGH SENSITIVITY) - Abnormal; Notable for the following components:   Troponin I (High Sensitivity) 118 (*)    All other components within normal limits  TROPONIN I (HIGH SENSITIVITY) - Abnormal; Notable for the following components:   Troponin I (High Sensitivity) 133 (*)    All other components within normal limits  RESP PANEL BY RT-PCR (FLU A&B, COVID) [id number]    EKG EKG Interpretation  Date/Time:  Friday January 02 2021 15:12:27 EDT Ventricular Rate:  60 PR Interval:  189 QRS Duration: 177 QT Interval:  440 QTC Calculation: 440 R Axis:   211 Text Interpretation: Sinus rhythm Consider left atrial enlargement Nonspecific intraventricular conduction delay No significant change since last ecg September 24 2017 Confirmed by Octaviano Glow [phone]) on 01/02/2021 3:51:12 PM  Radiology DG Chest 2 View  Result Date: 01/02/2021 CLINICAL DATA:  Shortness of breath. Weakness. Bilateral leg swelling. EXAM: CHEST - 2 VIEW COMPARISON:  09/24/2017 FINDINGS: Decreased inspiration. No gross change in enlargement of  the cardiac silhouette. The aorta remains mildly tortuous and calcified. Stable left subclavian bipolar pacemaker leads. Stable mildly prominent interstitial markings and stable calcified granuloma at the right lung base. Interval minimal patchy linear density in the right mid lung zone laterally. No acute bony abnormality. IMPRESSION: 1. Interval minimal linear atelectasis, pneumonia or scarring in the right mid lung zone laterally. Follow-up chest radiographs are recommended in 3-4 weeks to evaluate for resolution. 2. Stable cardiomegaly and mild chronic interstitial lung disease. Electronically Signed   By: Claudie Revering M.D.   On: 01/02/2021 14:39   CT Head Wo Contrast  Result Date: 01/02/2021 CLINICAL DATA:  Delirium, increased drowsiness, BILATERAL leg swelling, diabetes mellitus, hypertension EXAM: CT HEAD WITHOUT CONTRAST TECHNIQUE: Contiguous axial images were obtained from the base of the skull through the vertex without intravenous contrast. Sagittal and coronal MPR images reconstructed from axial data set. COMPARISON:  11/24/2019 FINDINGS: Brain: Generalized atrophy. Normal ventricular morphology. No midline shift or mass effect. Mild small vessel chronic ischemic changes of deep cerebral white matter. No intracranial hemorrhage, mass lesion, or evidence of acute infarction. No extra-axial fluid collections. Vascular: No hyperdense vessels. Atherosclerotic calcification of internal carotid arteries bilaterally at skull base Skull: Calvaria intact Sinuses/Orbits: Scattered mucosal thickening in ethmoid air cells. Mucosal retention cyst RIGHT maxillary sinus. Remaining paranasal sinuses and mastoid air cells clear Other: N/A IMPRESSION: Atrophy with mild small vessel chronic ischemic changes of deep cerebral white matter. No acute intracranial abnormalities. Electronically Signed   By: Lavonia Dana M.D.   On: 01/02/2021 17:05    Procedures Procedures   Medications Ordered in ED Medications   losartan (COZAAR) tablet 50 mg (has no administration in time range)  simvastatin (ZOCOR) tablet 20 mg (has no administration in time range)  apixaban (ELIQUIS) tablet 2.5 mg (has no administration in time range)  sodium chloride flush (NS) 0.9 % injection 3 mL (has no administration in time range)  sodium chloride flush (NS) 0.9 % injection 3 mL (has no administration in time range)  0.9 %  sodium chloride infusion (has no administration in time range)  acetaminophen (TYLENOL) tablet 650 mg (has no administration in time range)  ondansetron (ZOFRAN) injection 4 mg (has no administration in time range)  furosemide (LASIX) injection 40 mg (has no administration in time range)  furosemide (LASIX) injection 40 mg (40 mg Intravenous Given 01/02/21 1607)    ED Course  I have reviewed the triage vital signs and the nursing notes.  Pertinent labs & imaging results that were available during my care of the patient were reviewed by me and considered in my medical decision making (see chart for details).    MDM Rules/Calculators/A&P                          86 year old male presenting the emergency department today for evaluation of bilateral lower extremity edema that started about 3 to 4 days ago.  Reviewed/interpreted labs CBC without leukocytosis, anemia and appears chronic. BMP With mild hypokalemia, otherwise at abaseline for pt BNP elevated, likely secondary to chf exacerabation Trop elevated, suspect due to demand. Pt does not have chest pain  EKG - 211 Text Interpretation: Sinus rhythm Consider left atrial enlargement Nonspecific intraventricular conduction delay No significant change since last ecg September 24 2017   CXR reviewed/interpreted - 1. Interval minimal linear atelectasis, pneumonia or scarring in the right mid lung zone laterally. Follow-up chest radiographs are recommended in 3-4 weeks to evaluate for resolution. 2. Stable cardiomegaly and mild chronic interstitial lung  disease.  Pt with heart failure exacerbation. Iv lasix given in the ED. Will admit  3:56 PM CONSULT With Dr. [facility] who accepts patient for admission. Recommends holding abx at this time given no sxs of pneumonia, no fever, or leukocytosis to support cxr findings.  On recheck of pt, he is now sleepy which is new. Will at VBG. Dr. [facility] at bedside recommends adding head CT as well. He will f/u on result.   Final Clinical Impression(s) / ED Diagnoses Final diagnoses:  Acute on chronic congestive heart failure, unspecified heart failure type [facility])    Rx / DC Orders ED Discharge Orders     None        Bishop Dublin 01/02/21 1734    Wyvonnia Dusky, MD 01/03/21 1132    Wyvonnia Dusky, MD 01/03/21 1133

## 2021-01-02 NOTE — H&P (Signed)
TRH H&P   Patient Demographics:    Kyle Schultz, is a 87 y.o. male  MRN: [id number]   DOB - 1930/03/22  Admit Date - 01/02/2021  Outpatient Primary MD for the patient is Rosita Fire, MD  Referring MD/NP/PA: PA Couture  Outpatient Specialists: cardiology Dr Lovena Le    Patient coming from: home  Chief Complaint  Patient presents with   Leg Swelling      HPI:    Kyle Schultz  is a 87 y.o. male, with past medical history of CKD, diabetes mellitus, complete heart block s/p pacemaker, hyperlipidemia, hypertension, patient presents to the emergency department for evaluation of worsening bilateral lower extremity edema, and overall is poor historian, patient presents to worsening lower extremity edema, patient report he is compliant with his medications, he has been having worsening lower extremity edema over the last 5 days, causing some ambulation difficulties, he denies any chest pain, shortness of breath, no fever, no cough, is not very compliant with low-salt diet given he is eating a lot of food outside, as well he is noncompliant with his Lasix and is using it as needed. - in ED patient was +3 lower extremity edema, elevated BNP, chest x-ray significant with volume overload (questionable pneumonia), creatinine elevated at 2.34, which is at his baseline, patient was started on Lasix and [facility] hospital[facility] consulted to admit.    Review of systems:    In addition to the HPI above,  No Fever-chills, admits, fatigue and worsening lower extremity edema No Headache, No changes with Vision or hearing, No problems swallowing food or Liquids, No Chest pain, Cough or Shortness of Breath, No Abdominal pain, No Nausea or Vommitting, Bowel movements are regular, No Blood in stool or Urine, No dysuria, No new skin rashes or bruises, No new joints pains-aches,  No new weakness, tingling,  numbness in any extremity, No recent weight gain or loss, No polyuria, polydypsia or polyphagia, No significant Mental Stressors.  A full 10 point Review of Systems was done, except as stated above, all other Review of Systems were negative.   With Past History of the following :    Past Medical History:  Diagnosis Date   CKD (chronic kidney disease), stage I    Diabetes mellitus    First degree AV block    Hyperlipidemia    Hypertension    Second degree AV block    Sinus bradycardia       Past Surgical History:  Procedure Laterality Date   COLONOSCOPY  2010   Two 4 mm sessile hepatic flexure polyps, one slightly pedunculated 6 to 8 mm sessile descending colon polyp, small internal hemorrhoids. Simple adenomas.    colonscopy     HEMORRHOID SURGERY     PACEMAKER INSERTION  [address] jude   PPM GENERATOR CHANGEOUT N/A 09/27/2019   Procedure: PPM GENERATOR CHANGEOUT;  Surgeon: Evans Lance,  MD;  Location: [facility];  Service: Cardiovascular;  Laterality: N/A;   UMBILICAL HERNIA REPAIR  04/2008      Social History:     Social History   Tobacco Use   Smoking status: Never   Smokeless tobacco: Never  Substance Use Topics   Alcohol use: No    Alcohol/week: 0.0 standard drinks       Family History :     Family History  Problem Relation Age of Onset   Heart disease Father    Cancer Sister    Cancer Sister    Cancer Sister    ALS Sister    Colon cancer Neg Hx       Home Medications:   Prior to Admission medications   Medication Sig Start Date End Date Taking? Authorizing Provider  apixaban (ELIQUIS) 2.5 MG TABS tablet Take 1 tablet (2.5 mg total) by mouth 2 (two) times daily. 10/28/20  Yes Evans Lance, MD  carbamide peroxide (DEBROX) 6.5 % OTIC solution Place 5 drops into both ears 2 (two) times daily. 07/05/19  Yes Domenic Moras, PA-C  docusate sodium (COLACE) 100 MG capsule Take 100 mg by mouth daily as needed for mild constipation.   Yes  [provider]  furosemide (LASIX) 40 MG tablet Take 1 tablet (40 mg total) by mouth 2 (two) times daily for 14 days. 03/06/20 01/02/21 Yes Noemi Chapel, MD  glipiZIDE (GLUCOTROL) 5 MG tablet Take 1 tablet (5 mg total) by mouth 2 (two) times daily with a meal. 07/10/18  Yes Nida, Marella Chimes, MD  linagliptin (TRADJENTA) 5 MG TABS tablet Take 5 mg by mouth daily.   Yes [provider]  losartan (COZAAR) 50 MG tablet Take 1 tablet (50 mg total) by mouth daily. 10/28/20 10/28/21 Yes Evans Lance, MD  simvastatin (ZOCOR) 20 MG tablet Take 20 mg by mouth at bedtime. 10/04/20  Yes [provider]  glucose blood test strip 1 each by Other route 2 (two) times daily. Use as instructed 2 x daily. E11.65 EZ MAX 05/01/18   Cassandria Anger, MD  simvastatin (ZOCOR) 10 MG tablet Take by mouth. Patient not taking: Reported on 01/02/2021    [provider]     Allergies:     Allergies  Allergen Reactions   Other     "some kind of fluid pill I can't take"      Physical Exam:   Vitals  Blood pressure (!) 154/71, pulse (!) 59, temperature 98.7 F (37.1 C), resp. rate 17, SpO2 100 %.   1. General frail elderly male, laying in bed, no apparent distress  2.  Patient is answering most of questions appropriately, but overall he is slow to respond with some confusion, he is oriented x2 .  3. No F.N deficits, ALL C.Nerves Intact, Strength 5/5 all 4 extremities, Sensation intact all 4 extremities, Plantars down going.  4. Ears and Eyes appear Normal, Conjunctivae clear, PERRLA. Moist Oral Mucosa.  5. Supple Neck, No JVD, No cervical lymphadenopathy appriciated, No Carotid Bruits.  6. Symmetrical Chest wall movement, without rales, fair air entry bilaterally  7. RRR, No Gallops, Rubs or Murmurs, No Parasternal Heave.+3 edema  8. Positive Bowel Sounds, Abdomen Soft, No tenderness, No organomegaly appriciated,No rebound -guarding or rigidity.  9.  No  Cyanosis, Normal Skin Turgor, No Skin Rash or Bruise.  10. Good muscle tone,  joints appear normal , no effusions, Normal ROM.  11. No Palpable Lymph Nodes in Neck  or Axillae     Data Review:    CBC Recent Labs  Lab 01/02/21 1414  WBC 5.8  HGB 10.2*  HCT 32.7*  PLT 196  MCV 83.0  MCH 25.9*  MCHC 31.2  RDW 15.9*  LYMPHSABS 0.9  MONOABS 0.6  EOSABS 0.0  BASOSABS 0.0   ------------------------------------------------------------------------------------------------------------------  Chemistries  Recent Labs  Lab 01/02/21 1414  NA 137  K 3.4*  CL 106  CO2 24  GLUCOSE 130*  BUN 35*  CREATININE 2.34*  CALCIUM 9.0   ------------------------------------------------------------------------------------------------------------------ CrCl cannot be calculated (Unknown ideal weight.). ------------------------------------------------------------------------------------------------------------------ No results for input(s): TSH, T4TOTAL, T3FREE, THYROIDAB in the last 72 hours.  Invalid input(s): FREET3  Coagulation profile No results for input(s): INR, PROTIME in the last 168 hours. ------------------------------------------------------------------------------------------------------------------- No results for input(s): DDIMER in the last 72 hours. -------------------------------------------------------------------------------------------------------------------  Cardiac Enzymes No results for input(s): CKMB, TROPONINI, MYOGLOBIN in the last 168 hours.  Invalid input(s): CK ------------------------------------------------------------------------------------------------------------------    Component Value Date/Time   BNP 534.0 (H) 01/02/2021 1414     ---------------------------------------------------------------------------------------------------------------  Urinalysis    Component Value Date/Time   COLORURINE YELLOW 10/19/2016 2118   APPEARANCEUR CLEAR  10/19/2016 2118   LABSPEC 1.015 10/19/2016 2118   PHURINE 5.0 10/19/2016 2118   GLUCOSEU 150 (A) 10/19/2016 2118   HGBUR NEGATIVE 10/19/2016 2118   BILIRUBINUR NEGATIVE 10/19/2016 2118   KETONESUR NEGATIVE 10/19/2016 2118   PROTEINUR NEGATIVE 10/19/2016 2118   UROBILINOGEN 0.2 01/12/2015 0845   NITRITE NEGATIVE 10/19/2016 2118   LEUKOCYTESUR NEGATIVE 10/19/2016 2118    ----------------------------------------------------------------------------------------------------------------   Imaging Results:    DG Chest 2 View  Result Date: 01/02/2021 CLINICAL DATA:  Shortness of breath. Weakness. Bilateral leg swelling. EXAM: CHEST - 2 VIEW COMPARISON:  09/24/2017 FINDINGS: Decreased inspiration. No gross change in enlargement of the cardiac silhouette. The aorta remains mildly tortuous and calcified. Stable left subclavian bipolar pacemaker leads. Stable mildly prominent interstitial markings and stable calcified granuloma at the right lung base. Interval minimal patchy linear density in the right mid lung zone laterally. No acute bony abnormality. IMPRESSION: 1. Interval minimal linear atelectasis, pneumonia or scarring in the right mid lung zone laterally. Follow-up chest radiographs are recommended in 3-4 weeks to evaluate for resolution. 2. Stable cardiomegaly and mild chronic interstitial lung disease. Electronically Signed   By: Claudie Revering M.D.   On: 01/02/2021 14:39   CT Head Wo Contrast  Result Date: 01/02/2021 CLINICAL DATA:  Delirium, increased drowsiness, BILATERAL leg swelling, diabetes mellitus, hypertension EXAM: CT HEAD WITHOUT CONTRAST TECHNIQUE: Contiguous axial images were obtained from the base of the skull through the vertex without intravenous contrast. Sagittal and coronal MPR images reconstructed from axial data set. COMPARISON:  11/24/2019 FINDINGS: Brain: Generalized atrophy. Normal ventricular morphology. No midline shift or mass effect. Mild small vessel chronic ischemic  changes of deep cerebral white matter. No intracranial hemorrhage, mass lesion, or evidence of acute infarction. No extra-axial fluid collections. Vascular: No hyperdense vessels. Atherosclerotic calcification of internal carotid arteries bilaterally at skull base Skull: Calvaria intact Sinuses/Orbits: Scattered mucosal thickening in ethmoid air cells. Mucosal retention cyst RIGHT maxillary sinus. Remaining paranasal sinuses and mastoid air cells clear Other: N/A IMPRESSION: Atrophy with mild small vessel chronic ischemic changes of deep cerebral white matter. No acute intracranial abnormalities. Electronically Signed   By: Lavonia Dana M.D.   On: 01/02/2021 17:05    My personal review of EKG:  RVent. rate 60 BPM PR interval 189 ms QRS duration 177 ms QT/QTcB 440/440  ms P-R-T axes 0 211 40 Sinus rhythm Consider left atrial enlargement Nonspecific intraventricular conduction delay No significant change since last ecg September 24 2017   Assessment & Plan:    Active Problems:   Essential hypertension, benign   Cardiac pacemaker in situ   Chronic renal insufficiency, stage III (moderate) (HCC)   Uncontrolled type 2 diabetes mellitus with stage 3 chronic kidney disease (HCC)   Type 2 diabetes mellitus with other circulatory complications (HCC)   Acute on chronic diastolic CHF (congestive heart failure) (HCC)  Acute on chronic diastolic CHF -Most recent echo in 2019 with EF 50 to [id number], grade 1 diastolic CHF -She was significant evidence of volume overload, including worsening lower extremity edema, elevated BNP, and evidence of volume overload on chest x-ray -Admit under CHF pathway, will check daily weights, strict ins and out, will start on IV Lasix 40 mg IV twice daily. -We will repeat 2D echo as most recent was in 2019.  Type 2 diabetes mellitus -We will hold oral agents, check A1c, and keep on insulin sliding scale  CKD stage IV -Monitor closely and will be started on IV diuresis, continue  losartan  Atrial fibrillation -Heart rate is controlled, continue with Eliquis  History of complete heart block -S/p permanent pacemaker  Hypertension  - Continue losartan as tolerated    Type II DM  -We will hold glipizide and Tradjenta, and will keep on insulin sliding scale  X-ray on admission showing questionable pneumonia, but patient afebrile, no leukocytosis, denies any cough, very likely related to volume overload, will repeat x-ray in a.m. and monitor off antibiotic given he is nontoxic-appearing  Patient with some lethargy, blood gas was obtained, pH of 7.4, PCO2 42, CT head with no acute findings, he is hard of hearing    DVT Prophylaxis Eliquis AM Labs Ordered, also please review Full Orders  Family Communication: Admission, patients condition and plan of care including tests being ordered have been discussed with the patient and niece [place] who indicate understanding and agree with the plan and Code Status.  Code Status Full  Likely DC to  home  Condition GUARDED   Consults called: none    Admission status: inpatient    Time spent in minutes : 60 minutes   Phillips Climes M.D on 01/02/2021 at 6:57 PM   [facility] - Office  [phone]

## 2021-01-03 ENCOUNTER — Inpatient Hospital Stay (HOSPITAL_COMMUNITY): Payer: Medicare Other

## 2021-01-03 ENCOUNTER — Other Ambulatory Visit: Payer: Self-pay

## 2021-01-03 DIAGNOSIS — I5043 Acute on chronic combined systolic (congestive) and diastolic (congestive) heart failure: Secondary | ICD-10-CM

## 2021-01-03 DIAGNOSIS — I5031 Acute diastolic (congestive) heart failure: Secondary | ICD-10-CM

## 2021-01-03 DIAGNOSIS — N1832 Chronic kidney disease, stage 3b: Secondary | ICD-10-CM

## 2021-01-03 DIAGNOSIS — I482 Chronic atrial fibrillation, unspecified: Secondary | ICD-10-CM

## 2021-01-03 LAB — ECHOCARDIOGRAM COMPLETE
AV Mean grad: 2 mmHg
AV Peak grad: 5.9 mmHg
Ao pk vel: 1.21 m/s
Area-P 1/2: 4.93 cm2
Calc EF: 35.7 %
Height: 72 in
Single Plane A2C EF: 40.9 %
Single Plane A4C EF: 35.7 %
Weight: 3107.6 oz

## 2021-01-03 LAB — GLUCOSE, CAPILLARY
Glucose-Capillary: 117 mg/dL — ABNORMAL HIGH (ref 70–99)
Glucose-Capillary: 171 mg/dL — ABNORMAL HIGH (ref 70–99)
Glucose-Capillary: 233 mg/dL — ABNORMAL HIGH (ref 70–99)
Glucose-Capillary: 158 mg/dL — ABNORMAL HIGH (ref 70–99)

## 2021-01-03 LAB — BASIC METABOLIC PANEL
Anion gap: 10 (ref 5–15)
BUN: 36 mg/dL — ABNORMAL HIGH (ref 8–23)
CO2: 26 mmol/L (ref 22–32)
Calcium: 9.8 mg/dL (ref 8.9–10.3)
Chloride: 105 mmol/L (ref 98–111)
Creatinine, Ser: 2.15 mg/dL — ABNORMAL HIGH (ref 0.61–1.24)
GFR, Estimated: 29 mL/min — ABNORMAL LOW (ref >60)
Glucose, Bld: 134 mg/dL — ABNORMAL HIGH (ref 70–99)
Potassium: 3.3 mmol/L — ABNORMAL LOW (ref 3.5–5.1)
Sodium: 141 mmol/L (ref 135–145)

## 2021-01-03 MED ORDER — DAPAGLIFLOZIN PROPANEDIOL 5 MG PO TABS: 5.0000 mg | ORAL_TABLET | Freq: Every day | ORAL | Status: DC

## 2021-01-03 MED ORDER — TROLAMINE SALICYLATE 10 % EX CREA
TOPICAL_CREAM | Freq: Two times a day (BID) | CUTANEOUS | Status: DC | PRN
  Filled 2021-01-03: qty 85

## 2021-01-03 MED ORDER — METOPROLOL TARTRATE 25 MG PO TABS
12.5000 mg | ORAL_TABLET | Freq: Two times a day (BID) | ORAL | Status: DC
  Administered 2021-01-03 – 2021-01-06 (×5): 12.5 mg via ORAL
  Filled 2021-01-03 (×8): qty 1

## 2021-01-03 NOTE — Progress Notes (Signed)
PROGRESS NOTE    Kyle Schultz  [id number] DOB: 12-02-1929 DOA: 01/02/2021 PCP: Rosita Fire, MD    Chief Complaint  Patient presents with   Leg Swelling    Brief admission Narrative:  As per H&P written by Dr. Waldron Labs on 01/02/2021  Kyle Schultz  is a 87 y.o. male, with past medical history of CKD, diabetes mellitus, complete heart block s/p pacemaker, hyperlipidemia, hypertension, patient presents to the emergency department for evaluation of worsening bilateral lower extremity edema, and overall is poor historian, patient presents to worsening lower extremity edema, patient report he is compliant with his medications, he has been having worsening lower extremity edema over the last 5 days, causing some ambulation difficulties, he denies any chest pain, shortness of breath, no fever, no cough, is not very compliant with low-salt diet given he is eating a lot of food outside, as well he is noncompliant with his Lasix and is using it as needed. - in ED patient was +3 lower extremity edema, elevated BNP, chest x-ray significant with volume overload (questionable pneumonia), creatinine elevated at 2.34, which is at his baseline, patient was started on Lasix and [facility] hospitalist consulted to admit.  Assessment & Plan: 1-acute on chronic systolic/diastolic heart failure -Patient reports improvement in his breathing; still short of breath with activity and with significant swelling on his legs. -Extensive discussion regarding diet indiscretion, low sodium intake, medication compliance provided. -Will apply TED hoses 12 hours on and 12 hours off -Continue IV diuretics -Continue close monitoring of renal function and electrolytes. -Hopefully home in the next 24 to 48 hours. -Will add Farxiga and low-dose beta-blocker -2D echo demonstrating mild ventricular hypertrophy, regional wall motion and normality and decrease in his ejection fraction to 35 to 40%. -Cardiology service will be  consulted.  2-Essential hypertension, benign -Well-controlled currently -Continue current antihypertensive regimen.  3-Cardiac pacemaker in situ -Continue follow-up with cardiology service as an outpatient.  4-Chronic renal insufficiency, stage III (moderate) (HCC) -Stable despite diuresis -Continue close monitoring -Minimize the use of nephrotoxic agents.  5-Uncontrolled type 2 diabetes mellitus with stage 3 chronic kidney disease (HCC) -Continue modified carbohydrate diet -Continue the use of sliding scale insulin and follow CBGs fluctuation.  6-hyperlipidemia -Continue Zocor.  7-history of chronic atrial fibrillation -Rate control -Patient denies palpitation -Continue Eliquis for secondary prevention.     DVT prophylaxis: Chronically on Eliquis. Code Status: Full code Family Communication: Son at bedside Disposition:   Status is: Inpatient  Remains inpatient appropriate because:IV treatments appropriate due to intensity of illness or inability to take PO  Dispo: The patient is from: Home              Anticipated d/c is to: Home              Patient currently is not medically stable to d/c.   Difficult to place patient No       Consultants:  Cardiology will be consulted.  Procedures:  2D echo: 1. Left ventricular ejection fraction, by estimation, is 35 to 40%. The  left ventricle has moderately decreased function. The left ventricle  demonstrates regional wall motion abnormalities (see scoring  diagram/findings for description). There is mild  left ventricular hypertrophy. Left ventricular diastolic parameters are  indeterminate.   2. Right ventricular systolic function is normal. The right ventricular  size is mildly enlarged. There is moderately elevated pulmonary artery  systolic pressure.   3. Left atrial size was severely dilated.   4. Right atrial size was severely  dilated.   5. The mitral valve is normal in structure. Trivial mitral valve   regurgitation. No evidence of mitral stenosis.   6. The aortic valve is tricuspid. Aortic valve regurgitation is trivial.  Mild aortic valve sclerosis is present, with no evidence of aortic valve  stenosis.   7. Aortic dilatation noted. There is mild dilatation of the ascending  aorta, measuring 39 mm.   Antimicrobials:  None  Subjective: Still short of breath with activity and complaining of lower extremity swelling.  No chest pain, no nausea, no vomiting.  No requiring oxygen supplementation  Objective: Vitals:   01/02/21 2130 01/03/21 0208 01/03/21 0500 01/03/21 0609  BP: (!) 164/77 (!) 149/74  (!) 162/74  Pulse: 65 63  60  Resp: '20 18  18  '$ Temp: 99.2 F (37.3 C) 98.7 F (37.1 C)  98.4 F (36.9 C)  TempSrc: Oral Oral    SpO2: 100% 100%  100%  Weight: 87 kg  88.1 kg   Height: 6' (1.829 m)       Intake/Output Summary (Last 24 hours) at 01/03/2021 1400 Last data filed at 01/03/2021 1000 Gross per 24 hour  Intake 240 ml  Output 1000 ml  Net -760 ml   Filed Weights   01/02/21 2130 01/03/21 0500  Weight: 87 kg 88.1 kg    Examination:  General exam: Appears calm and comfortable; patient denies chest pain, no fever. Respiratory system: Decreased breath sounds at the bases, no using accessory muscle.  No wheezing; fine crackles appreciated on exam. Cardiovascular system: Rate controlled, no rubs, no gallops, no JVD on exam.  2 lower extremity edema bilaterally. Gastrointestinal system: Abdomen is nondistended, soft and nontender. No organomegaly or masses felt. Normal bowel sounds heard. Central nervous system: Alert and oriented. No focal neurological deficits. Extremities: No cyanosis or clubbing. Skin: No petechiae. Psychiatry: Judgement and insight appear normal. Mood & affect appropriate.     Data Reviewed: I have personally reviewed following labs and imaging studies  CBC: Recent Labs  Lab 01/02/21 1414  WBC 5.8  NEUTROABS 4.3  HGB 10.2*  HCT 32.7*  MCV  83.0  PLT 123456    Basic Metabolic Panel: Recent Labs  Lab 01/02/21 1414 01/03/21 0554  NA 137 141  K 3.4* 3.3*  CL 106 105  CO2 24 26  GLUCOSE 130* 134*  BUN 35* 36*  CREATININE 2.34* 2.15*  CALCIUM 9.0 9.8    GFR: Estimated Creatinine Clearance: 25.6 mL/min (A) (by C-G formula based on SCr of 2.15 mg/dL (H)).   CBG: Recent Labs  Lab 01/02/21 2208 01/03/21 0758 01/03/21 1211  GLUCAP 151* 117* 171*     Recent Results (from the past 240 hour(s))  Resp Panel by RT-PCR (Flu A&B, Covid) Nasopharyngeal Swab     Status: None   Collection Time: 01/02/21  4:15 PM   Specimen: Nasopharyngeal Swab; Nasopharyngeal(NP) swabs in vial transport medium  Result Value Ref Range Status   SARS Coronavirus 2 by RT PCR NEGATIVE NEGATIVE Final    Comment: (NOTE) SARS-CoV-2 target nucleic acids are NOT DETECTED.  The SARS-CoV-2 RNA is generally detectable in upper respiratory specimens during the acute phase of infection. The lowest concentration of SARS-CoV-2 viral copies this assay can detect is 138 copies/mL. A negative result does not preclude SARS-Cov-2 infection and should not be used as the sole basis for treatment or other patient management decisions. A negative result may occur with  improper specimen collection/handling, submission of specimen other than nasopharyngeal swab, presence  of viral mutation(s) within the areas targeted by this assay, and inadequate number of viral copies(<138 copies/mL). A negative result must be combined with clinical observations, patient history, and epidemiological information. The expected result is Negative.  Fact Sheet for Patients:  EntrepreneurPulse.com.au  Fact Sheet for Healthcare Providers:  IncredibleEmployment.be  This test is no t yet approved or cleared by the [place] FDA and  has been authorized for detection and/or diagnosis of SARS-CoV-2 by FDA under an Emergency Use Authorization  (EUA). This EUA will remain  in effect (meaning this test can be used) for the duration of the COVID-19 declaration under Section 564(b)(1) of the Act, 21 U.S.C.section 360bbb-3(b)(1), unless the authorization is terminated  or revoked sooner.       Influenza A by PCR NEGATIVE NEGATIVE Final   Influenza B by PCR NEGATIVE NEGATIVE Final    Comment: (NOTE) The Xpert Xpress SARS-CoV-2/FLU/RSV plus assay is intended as an aid in the diagnosis of influenza from Nasopharyngeal swab specimens and should not be used as a sole basis for treatment. Nasal washings and aspirates are unacceptable for Xpert Xpress SARS-CoV-2/FLU/RSV testing.  Fact Sheet for Patients: EntrepreneurPulse.com.au  Fact Sheet for Healthcare Providers: IncredibleEmployment.be  This test is not yet approved or cleared by the [place] FDA and has been authorized for detection and/or diagnosis of SARS-CoV-2 by FDA under an Emergency Use Authorization (EUA). This EUA will remain in effect (meaning this test can be used) for the duration of the COVID-19 declaration under Section 564(b)(1) of the Act, 21 U.S.C. section 360bbb-3(b)(1), unless the authorization is terminated or revoked.  Performed at [facility], [address] [place], [place] [address]          Radiology Studies: DG Chest 2 View  Result Date: 01/02/2021 CLINICAL DATA:  Shortness of breath. Weakness. Bilateral leg swelling. EXAM: CHEST - 2 VIEW COMPARISON:  09/24/2017 FINDINGS: Decreased inspiration. No gross change in enlargement of the cardiac silhouette. The aorta remains mildly tortuous and calcified. Stable left subclavian bipolar pacemaker leads. Stable mildly prominent interstitial markings and stable calcified granuloma at the right lung base. Interval minimal patchy linear density in the right mid lung zone laterally. No acute bony abnormality. IMPRESSION: 1. Interval minimal linear atelectasis,  pneumonia or scarring in the right mid lung zone laterally. Follow-up chest radiographs are recommended in 3-4 weeks to evaluate for resolution. 2. Stable cardiomegaly and mild chronic interstitial lung disease. Electronically Signed   By: Claudie Revering M.D.   On: 01/02/2021 14:39   CT Head Wo Contrast  Result Date: 01/02/2021 CLINICAL DATA:  Delirium, increased drowsiness, BILATERAL leg swelling, diabetes mellitus, hypertension EXAM: CT HEAD WITHOUT CONTRAST TECHNIQUE: Contiguous axial images were obtained from the base of the skull through the vertex without intravenous contrast. Sagittal and coronal MPR images reconstructed from axial data set. COMPARISON:  11/24/2019 FINDINGS: Brain: Generalized atrophy. Normal ventricular morphology. No midline shift or mass effect. Mild small vessel chronic ischemic changes of deep cerebral white matter. No intracranial hemorrhage, mass lesion, or evidence of acute infarction. No extra-axial fluid collections. Vascular: No hyperdense vessels. Atherosclerotic calcification of internal carotid arteries bilaterally at skull base Skull: Calvaria intact Sinuses/Orbits: Scattered mucosal thickening in ethmoid air cells. Mucosal retention cyst RIGHT maxillary sinus. Remaining paranasal sinuses and mastoid air cells clear Other: N/A IMPRESSION: Atrophy with mild small vessel chronic ischemic changes of deep cerebral white matter. No acute intracranial abnormalities. Electronically Signed   By: Lavonia Dana M.D.   On: 01/02/2021 17:05  DG Chest Port 1 View  Result Date: 01/03/2021 CLINICAL DATA:  Pneumonia EXAM: PORTABLE CHEST 1 VIEW COMPARISON:  01/02/2021 FINDINGS: There is progressive asymmetric interstitial infiltrate within the left lung base, possibly infectious in the appropriate clinical setting. Benign calcified granuloma within the right lung base again noted. No pneumothorax or pleural effusion. Cardiac size within normal limits. Left subclavian dual lead pacemaker  unchanged. There is central pulmonary vascular congestion again noted without superimposed overt pulmonary edema. No acute bone abnormality. IMPRESSION: Progressive left basilar pulmonary infiltrate, possibly infectious in the appropriate clinical setting. Persistent central pulmonary vascular congestion without overt pulmonary edema. Electronically Signed   By: Fidela Salisbury MD   On: 01/03/2021 06:02    Scheduled Meds:  apixaban  2.5 mg Oral BID   furosemide  40 mg Intravenous BID   insulin aspart  0-15 Units Subcutaneous TID WC   insulin aspart  0-5 Units Subcutaneous QHS   losartan  50 mg Oral Daily   simvastatin  20 mg Oral QHS   sodium chloride flush  3 mL Intravenous Q12H   Continuous Infusions:  sodium chloride       LOS: 1 day    Time spent: 35 minutes    Barton Dubois, MD [facility]   To contact the attending provider between 7A-7P or the covering provider during after hours 7P-7A, please log into the web site [email or web address] and access using universal [place] password for that web site. If you do not have the password, please call the hospital operator.  01/03/2021, 2:00 PM

## 2021-01-03 NOTE — Progress Notes (Signed)
  Echocardiogram 2D Echocardiogram has been performed.  Kyle Schultz 01/03/2021, 1:51 PM

## 2021-01-04 LAB — BASIC METABOLIC PANEL
Anion gap: 10 (ref 5–15)
BUN: 43 mg/dL — ABNORMAL HIGH (ref 8–23)
CO2: 28 mmol/L (ref 22–32)
Calcium: 9.1 mg/dL (ref 8.9–10.3)
Chloride: 101 mmol/L (ref 98–111)
Creatinine, Ser: 2.64 mg/dL — ABNORMAL HIGH (ref 0.61–1.24)
GFR, Estimated: 22 mL/min — ABNORMAL LOW (ref >60)
Glucose, Bld: 173 mg/dL — ABNORMAL HIGH (ref 70–99)
Potassium: 3.2 mmol/L — ABNORMAL LOW (ref 3.5–5.1)
Sodium: 139 mmol/L (ref 135–145)

## 2021-01-04 LAB — GLUCOSE, CAPILLARY
Glucose-Capillary: 161 mg/dL — ABNORMAL HIGH (ref 70–99)
Glucose-Capillary: 172 mg/dL — ABNORMAL HIGH (ref 70–99)
Glucose-Capillary: 131 mg/dL — ABNORMAL HIGH (ref 70–99)
Glucose-Capillary: 209 mg/dL — ABNORMAL HIGH (ref 70–99)

## 2021-01-04 MED ORDER — MUSCLE RUB 10-15 % EX CREA
TOPICAL_CREAM | Freq: Two times a day (BID) | CUTANEOUS | Status: DC | PRN
  Filled 2021-01-04: qty 85

## 2021-01-04 MED ORDER — POTASSIUM CHLORIDE CRYS ER 20 MEQ PO TBCR
40.0000 meq | EXTENDED_RELEASE_TABLET | Freq: Once | ORAL | Status: AC
  Administered 2021-01-04: 40 meq via ORAL
  Filled 2021-01-04: qty 4

## 2021-01-04 MED ORDER — ISOSORB DINITRATE-HYDRALAZINE 20-37.5 MG PO TABS
1.0000 | ORAL_TABLET | Freq: Two times a day (BID) | ORAL | Status: DC
  Administered 2021-01-04 – 2021-01-07 (×5): 1 via ORAL
  Filled 2021-01-04 (×11): qty 1

## 2021-01-04 MED ORDER — FUROSEMIDE 10 MG/ML IJ SOLN
40.0000 mg | Freq: Every day | INTRAMUSCULAR | Status: DC
  Administered 2021-01-05: 40 mg via INTRAVENOUS
  Filled 2021-01-04: qty 4

## 2021-01-04 NOTE — Progress Notes (Signed)
PROGRESS NOTE    Kyle Schultz  [id number] DOB: 1930-04-20 DOA: 01/02/2021 PCP: Rosita Fire, MD    Chief Complaint  Patient presents with   Leg Swelling    Brief admission Narrative:  As per H&P written by Dr. Waldron Labs on 01/02/2021  Kyle Schultz  is a 87 y.o. male, with past medical history of CKD, diabetes mellitus, complete heart block s/p pacemaker, hyperlipidemia, hypertension, patient presents to the emergency department for evaluation of worsening bilateral lower extremity edema, and overall is poor historian, patient presents to worsening lower extremity edema, patient report he is compliant with his medications, he has been having worsening lower extremity edema over the last 5 days, causing some ambulation difficulties, he denies any chest pain, shortness of breath, no fever, no cough, is not very compliant with low-salt diet given he is eating a lot of food outside, as well he is noncompliant with his Lasix and is using it as needed. - in ED patient was +3 lower extremity edema, elevated BNP, chest x-ray significant with volume overload (questionable pneumonia), creatinine elevated at 2.34, which is at his baseline, patient was started on Lasix and [facility] hospitalist consulted to admit.  Assessment & Plan: 1-acute on chronic systolic/diastolic heart failure -Patient reports improvement in his breathing; still short of breath with activity and with significant swelling on his legs. -Extensive discussion regarding diet indiscretion, low sodium intake, medication compliance provided. -Will continue TED hoses 12 hours on and 12 hours off -Continue IV diuretics, dose adjusted to once a day as his swelling continue to improved. -Continue close monitoring of renal function and electrolytes. -Hopefully home in the next 24 to 48 hours. -Will add bidil and low-dose beta-blocker -not a candidate for Farxiga given renal GFR. -2D echo demonstrating mild ventricular hypertrophy, regional  wall motion and normality and decrease in his ejection fraction to 35 to 40%. -Cardiology service consulted.  2-Essential hypertension, benign -Well-controlled currently -Continue current antihypertensive regimen. -following VS while adding adjusted medication dosages for his CHF.  3-Cardiac pacemaker in situ -Continue follow-up with cardiology service as an outpatient.  4-Chronic renal insufficiency, stage III (moderate) (HCC) -Stable despite diuresis -Continue close monitoring -Continue to minimize the use of nephrotoxic agents as much as possible.Marland Kitchen  5-Uncontrolled type 2 diabetes mellitus with stage 3 chronic kidney disease ([place]). -Continue modified carbohydrate diet -Continue the use of sliding scale insulin and follow CBGs fluctuation. -Given progression of his renal function not a candidate for [place].  6-hyperlipidemia -Continue Zocor.  7-history of chronic atrial fibrillation -Rate controlled. -Patient denies palpitation or CP. -Continue Eliquis for secondary prevention. -Continue telemetry monitoring.   8-physical deconditioning  -will ask PT to evaluate. -might benefit of short term rehab at SNF at discharge.  DVT prophylaxis: Chronically on Eliquis. Code Status: Full code Family Communication: Son at bedside Disposition:   Status is: Inpatient  Remains inpatient appropriate because:IV treatments appropriate due to intensity of illness or inability to take PO  Dispo: The patient is from: Home              Anticipated d/c is to: Home              Patient currently is not medically stable to d/c.   Difficult to place patient No       Consultants:  Cardiology will be consulted.  Procedures:  2D echo: 1. Left ventricular ejection fraction, by estimation, is 35 to 40%. The  left ventricle has moderately decreased function. The left ventricle  demonstrates  regional wall motion abnormalities (see scoring  diagram/findings for description). There is mild   left ventricular hypertrophy. Left ventricular diastolic parameters are  indeterminate.   2. Right ventricular systolic function is normal. The right ventricular  size is mildly enlarged. There is moderately elevated pulmonary artery  systolic pressure.   3. Left atrial size was severely dilated.   4. Right atrial size was severely dilated.   5. The mitral valve is normal in structure. Trivial mitral valve  regurgitation. No evidence of mitral stenosis.   6. The aortic valve is tricuspid. Aortic valve regurgitation is trivial.  Mild aortic valve sclerosis is present, with no evidence of aortic valve  stenosis.   7. Aortic dilatation noted. There is mild dilatation of the ascending  aorta, measuring 39 mm.   Antimicrobials:  None  Subjective: Patient reports shortness of breath with activity and mild orthopnea.  Leg lower extremity swelling improving.  No nausea, no vomiting, no chest pain.  No requiring oxygen supplementation.  Objective: Vitals:   01/03/21 2122 01/04/21 0425 01/04/21 0500 01/04/21 0935  BP: 134/61 (!) 126/57    Pulse: 66 (!) 55  63  Resp: 18 18    Temp: 97.9 F (36.6 C) (!) 97.3 F (36.3 C)    TempSrc:      SpO2: 96% 100%    Weight:   84.7 kg   Height:        Intake/Output Summary (Last 24 hours) at 01/04/2021 1313 Last data filed at 01/04/2021 1201 Gross per 24 hour  Intake 480 ml  Output 150 ml  Net 330 ml   Filed Weights   01/02/21 2130 01/03/21 0500 01/04/21 0500  Weight: 87 kg 88.1 kg 84.7 kg    Examination: General exam: Alert, awake, oriented x 3; reports feeling weak and tired; still short of breath with activity and expressing mild orthopnea.  Leg swelling improving. Respiratory system: Decreased breath sounds at the bases; fine crackles.  No wheezing, no using accessory muscle.  No requiring oxygen supplementation. Cardiovascular system: Rate controlled.  No rubs, no gallops, no JVD appreciated on exam.  1-2 lower extremity edema  appreciated examination.   Gastrointestinal system: Abdomen is nondistended, soft and nontender. No organomegaly or masses felt. Normal bowel sounds heard. Central nervous system: Alert and oriented. No focal neurological deficits. Extremities: No cyanosis or clubbing; 1-2+ lower extremity edema appreciated bilaterally. Skin: No rashes or petechiae. Psychiatry: Judgement and insight appear normal. Mood & affect appropriate.   Data Reviewed: I have personally reviewed following labs and imaging studies  CBC: Recent Labs  Lab 01/02/21 1414  WBC 5.8  NEUTROABS 4.3  HGB 10.2*  HCT 32.7*  MCV 83.0  PLT 123456    Basic Metabolic Panel: Recent Labs  Lab 01/02/21 1414 01/03/21 0554 01/04/21 0615  NA 137 141 139  K 3.4* 3.3* 3.2*  CL 106 105 101  CO2 '24 26 28  '$ GLUCOSE 130* 134* 173*  BUN 35* 36* 43*  CREATININE 2.34* 2.15* 2.64*  CALCIUM 9.0 9.8 9.1    GFR: Estimated Creatinine Clearance: 20.8 mL/min (A) (by C-G formula based on SCr of 2.64 mg/dL (H)).   CBG: Recent Labs  Lab 01/03/21 1211 01/03/21 1654 01/03/21 2121 01/04/21 0736 01/04/21 1110  GLUCAP 171* 233* 158* 161* 172*     Recent Results (from the past 240 hour(s))  Resp Panel by RT-PCR (Flu A&B, Covid) Nasopharyngeal Swab     Status: None   Collection Time: 01/02/21  4:15 PM  Specimen: Nasopharyngeal Swab; Nasopharyngeal(NP) swabs in vial transport medium  Result Value Ref Range Status   SARS Coronavirus 2 by RT PCR NEGATIVE NEGATIVE Final    Comment: (NOTE) SARS-CoV-2 target nucleic acids are NOT DETECTED.  The SARS-CoV-2 RNA is generally detectable in upper respiratory specimens during the acute phase of infection. The lowest concentration of SARS-CoV-2 viral copies this assay can detect is 138 copies/mL. A negative result does not preclude SARS-Cov-2 infection and should not be used as the sole basis for treatment or other patient management decisions. A negative result may occur with  improper  specimen collection/handling, submission of specimen other than nasopharyngeal swab, presence of viral mutation(s) within the areas targeted by this assay, and inadequate number of viral copies(<138 copies/mL). A negative result must be combined with clinical observations, patient history, and epidemiological information. The expected result is Negative.  Fact Sheet for Patients:  EntrepreneurPulse.com.au  Fact Sheet for Healthcare Providers:  IncredibleEmployment.be  This test is no t yet approved or cleared by the [place] FDA and  has been authorized for detection and/or diagnosis of SARS-CoV-2 by FDA under an Emergency Use Authorization (EUA). This EUA will remain  in effect (meaning this test can be used) for the duration of the COVID-19 declaration under Section 564(b)(1) of the Act, 21 U.S.C.section 360bbb-3(b)(1), unless the authorization is terminated  or revoked sooner.       Influenza A by PCR NEGATIVE NEGATIVE Final   Influenza B by PCR NEGATIVE NEGATIVE Final    Comment: (NOTE) The Xpert Xpress SARS-CoV-2/FLU/RSV plus assay is intended as an aid in the diagnosis of influenza from Nasopharyngeal swab specimens and should not be used as a sole basis for treatment. Nasal washings and aspirates are unacceptable for Xpert Xpress SARS-CoV-2/FLU/RSV testing.  Fact Sheet for Patients: EntrepreneurPulse.com.au  Fact Sheet for Healthcare Providers: IncredibleEmployment.be  This test is not yet approved or cleared by the [place] FDA and has been authorized for detection and/or diagnosis of SARS-CoV-2 by FDA under an Emergency Use Authorization (EUA). This EUA will remain in effect (meaning this test can be used) for the duration of the COVID-19 declaration under Section 564(b)(1) of the Act, 21 U.S.C. section 360bbb-3(b)(1), unless the authorization is terminated or revoked.  Performed at  [facility], [address] [place], [place] [address]      Radiology Studies: DG Chest 2 View  Result Date: 01/02/2021 CLINICAL DATA:  Shortness of breath. Weakness. Bilateral leg swelling. EXAM: CHEST - 2 VIEW COMPARISON:  09/24/2017 FINDINGS: Decreased inspiration. No gross change in enlargement of the cardiac silhouette. The aorta remains mildly tortuous and calcified. Stable left subclavian bipolar pacemaker leads. Stable mildly prominent interstitial markings and stable calcified granuloma at the right lung base. Interval minimal patchy linear density in the right mid lung zone laterally. No acute bony abnormality. IMPRESSION: 1. Interval minimal linear atelectasis, pneumonia or scarring in the right mid lung zone laterally. Follow-up chest radiographs are recommended in 3-4 weeks to evaluate for resolution. 2. Stable cardiomegaly and mild chronic interstitial lung disease. Electronically Signed   By: Claudie Revering M.D.   On: 01/02/2021 14:39   CT Head Wo Contrast  Result Date: 01/02/2021 CLINICAL DATA:  Delirium, increased drowsiness, BILATERAL leg swelling, diabetes mellitus, hypertension EXAM: CT HEAD WITHOUT CONTRAST TECHNIQUE: Contiguous axial images were obtained from the base of the skull through the vertex without intravenous contrast. Sagittal and coronal MPR images reconstructed from axial data set. COMPARISON:  11/24/2019 FINDINGS: Brain: Generalized atrophy. Normal  ventricular morphology. No midline shift or mass effect. Mild small vessel chronic ischemic changes of deep cerebral white matter. No intracranial hemorrhage, mass lesion, or evidence of acute infarction. No extra-axial fluid collections. Vascular: No hyperdense vessels. Atherosclerotic calcification of internal carotid arteries bilaterally at skull base Skull: Calvaria intact Sinuses/Orbits: Scattered mucosal thickening in ethmoid air cells. Mucosal retention cyst RIGHT maxillary sinus. Remaining paranasal sinuses and mastoid  air cells clear Other: N/A IMPRESSION: Atrophy with mild small vessel chronic ischemic changes of deep cerebral white matter. No acute intracranial abnormalities. Electronically Signed   By: Lavonia Dana M.D.   On: 01/02/2021 17:05   DG Chest Port 1 View  Result Date: 01/03/2021 CLINICAL DATA:  Pneumonia EXAM: PORTABLE CHEST 1 VIEW COMPARISON:  01/02/2021 FINDINGS: There is progressive asymmetric interstitial infiltrate within the left lung base, possibly infectious in the appropriate clinical setting. Benign calcified granuloma within the right lung base again noted. No pneumothorax or pleural effusion. Cardiac size within normal limits. Left subclavian dual lead pacemaker unchanged. There is central pulmonary vascular congestion again noted without superimposed overt pulmonary edema. No acute bone abnormality. IMPRESSION: Progressive left basilar pulmonary infiltrate, possibly infectious in the appropriate clinical setting. Persistent central pulmonary vascular congestion without overt pulmonary edema. Electronically Signed   By: Fidela Salisbury MD   On: 01/03/2021 06:02   ECHOCARDIOGRAM COMPLETE  Result Date: 01/03/2021    ECHOCARDIOGRAM REPORT   Patient Name:   SANDEEP OLDS Date of Exam: 01/03/2021 Medical Rec #:  [id number]     Height:       72.0 in Accession #:    [id number]    Weight:       194.2 lb Date of Birth:  1929-07-16      BSA:          2.104 m Patient Age:    73 years      BP:           162/74 mmHg Patient Gender: M             HR:           60 bpm. Exam Location:  [facility] Procedure: 2D Echo, Cardiac Doppler and Color Doppler Indications:    CHF-Acute Diastolic [id number]  History:        Patient has prior history of Echocardiogram examinations, most                 recent 08/03/2017. CHF, Pacemaker; Risk Factors:Hypertension,                 Diabetes and Dyslipidemia.  Sonographer:    Wenda Low Referring Phys: [place]  1. Left ventricular ejection fraction, by  estimation, is 35 to 40%. The left ventricle has moderately decreased function. The left ventricle demonstrates regional wall motion abnormalities (see scoring diagram/findings for description). There is mild left ventricular hypertrophy. Left ventricular diastolic parameters are indeterminate.  2. Right ventricular systolic function is normal. The right ventricular size is mildly enlarged. There is moderately elevated pulmonary artery systolic pressure.  3. Left atrial size was severely dilated.  4. Right atrial size was severely dilated.  5. The mitral valve is normal in structure. Trivial mitral valve regurgitation. No evidence of mitral stenosis.  6. The aortic valve is tricuspid. Aortic valve regurgitation is trivial. Mild aortic valve sclerosis is present, with no evidence of aortic valve stenosis.  7. Aortic dilatation noted. There is mild dilatation of the ascending aorta, measuring 39 mm.  FINDINGS  Left Ventricle: Left ventricular ejection fraction, by estimation, is 35 to 40%. The left ventricle has moderately decreased function. The left ventricle demonstrates regional wall motion abnormalities. The left ventricular internal cavity size was normal in size. There is mild left ventricular hypertrophy. Abnormal (paradoxical) septal motion, consistent with RV pacemaker. Left ventricular diastolic parameters are indeterminate.  LV Wall Scoring: The entire anterior septum, mid inferoseptal segment, apical anterior segment, apical inferior segment, and apex are akinetic. The anterior wall, entire lateral wall, inferior wall, and basal inferoseptal segment are normal. Right Ventricle: The right ventricular size is mildly enlarged. No increase in right ventricular wall thickness. Right ventricular systolic function is normal. There is moderately elevated pulmonary artery systolic pressure. The tricuspid regurgitant velocity is 3.45 m/s, and with an assumed right atrial pressure of 8 mmHg, the estimated right  ventricular systolic pressure is 123456 mmHg. Left Atrium: Left atrial size was severely dilated. Right Atrium: Right atrial size was severely dilated. Pericardium: Trivial pericardial effusion is present. Mitral Valve: The mitral valve is normal in structure. Trivial mitral valve regurgitation. No evidence of mitral valve stenosis. MV peak gradient, 3.8 mmHg. The mean mitral valve gradient is 1.0 mmHg. Tricuspid Valve: The tricuspid valve is normal in structure. Tricuspid valve regurgitation is trivial. Aortic Valve: The aortic valve is tricuspid. Aortic valve regurgitation is trivial. Mild aortic valve sclerosis is present, with no evidence of aortic valve stenosis. Aortic valve mean gradient measures 2.0 mmHg. Aortic valve peak gradient measures 5.9 mmHg. Pulmonic Valve: The pulmonic valve was not well visualized. Pulmonic valve regurgitation is not visualized. Aorta: The aortic root is normal in size and structure and aortic dilatation noted. There is mild dilatation of the ascending aorta, measuring 39 mm. IAS/Shunts: The interatrial septum was not well visualized.  LEFT VENTRICLE PLAX 2D LVIDd:         5.22 cm LV PW:         1.22 cm LV IVS:        1.23 cm  LV Volumes (MOD) LV vol d, MOD A2C: 117.0 ml LV vol d, MOD A4C: 98.6 ml LV vol s, MOD A2C: 69.2 ml LV vol s, MOD A4C: 63.4 ml LV SV MOD A2C:     47.8 ml LV SV MOD A4C:     98.6 ml LV SV MOD BP:      37.7 ml RIGHT VENTRICLE TAPSE (M-mode): 2.2 cm LEFT ATRIUM            Index       RIGHT ATRIUM           Index LA Vol (A2C): 131.0 ml 62.26 ml/m RA Area:     26.00 cm LA Vol (A4C): 79.1 ml  37.59 ml/m RA Volume:   99.10 ml  47.10 ml/m  AORTIC VALVE AV Vmax:           121.00 cm/s AV Vmean:          70.400 cm/s AV VTI:            0.203 m AV Peak Grad:      5.9 mmHg AV Mean Grad:      2.0 mmHg LVOT Vmax:         90.20 cm/s LVOT Vmean:        56.200 cm/s LVOT VTI:          0.156 m LVOT/AV VTI ratio: 0.77  AORTA Ao Root diam: 3.60 cm Ao Asc diam:  3.90 cm MITRAL  VALVE  TRICUSPID VALVE MV Area (PHT): 4.93 cm    TR Peak grad:   47.6 mmHg MV Peak grad:  3.8 mmHg    TR Vmax:        345.00 cm/s MV Mean grad:  1.0 mmHg MV Vmax:       0.97 m/s    SHUNTS MV Vmean:      41.3 cm/s   Systemic VTI: 0.16 m MV Decel Time: 154 msec MV E velocity: 91.20 cm/s MV A velocity: 53.80 cm/s MV E/A ratio:  1.70 Oswaldo Milian MD Electronically signed by Oswaldo Milian MD Signature Date/Time: 01/03/2021/2:24:03 PM    Final     Scheduled Meds:  apixaban  2.5 mg Oral BID   furosemide  40 mg Intravenous BID   insulin aspart  0-15 Units Subcutaneous TID WC   insulin aspart  0-5 Units Subcutaneous QHS   isosorbide-hydrALAZINE  1 tablet Oral BID   losartan  50 mg Oral Daily   metoprolol tartrate  12.5 mg Oral BID   simvastatin  20 mg Oral QHS   sodium chloride flush  3 mL Intravenous Q12H   Continuous Infusions:  sodium chloride       LOS: 2 days    Time spent: 35 minutes   Barton Dubois, MD [facility]   To contact the attending provider between 7A-7P or the covering provider during after hours 7P-7A, please log into the web site [email or web address] and access using universal [place] password for that web site. If you do not have the password, please call the hospital operator.  01/04/2021, 1:13 PM

## 2021-01-04 NOTE — Plan of Care (Signed)
  Problem: Education: Goal: Ability to demonstrate management of disease process will improve Outcome: Progressing Goal: Ability to verbalize understanding of medication therapies will improve Outcome: Progressing   

## 2021-01-04 NOTE — Progress Notes (Signed)
   01/04/21 2120  Vitals  BP (!) 96/48  BP Location Right Arm  BP Method Manual  MEWS COLOR  MEWS Score Color Green  MEWS Score  MEWS Temp 0  MEWS Systolic 1  MEWS Pulse 0  MEWS RR 0  MEWS LOC 0  MEWS Score 1   MD notified. Instructed to hold scheduled Lopressor and Bidel.

## 2021-01-05 ENCOUNTER — Encounter (HOSPITAL_COMMUNITY): Payer: Self-pay | Admitting: Internal Medicine

## 2021-01-05 DIAGNOSIS — N183 Chronic kidney disease, stage 3 unspecified: Secondary | ICD-10-CM

## 2021-01-05 DIAGNOSIS — I4821 Permanent atrial fibrillation: Secondary | ICD-10-CM

## 2021-01-05 DIAGNOSIS — N179 Acute kidney failure, unspecified: Secondary | ICD-10-CM

## 2021-01-05 DIAGNOSIS — I10 Essential (primary) hypertension: Secondary | ICD-10-CM

## 2021-01-05 DIAGNOSIS — I5023 Acute on chronic systolic (congestive) heart failure: Secondary | ICD-10-CM

## 2021-01-05 DIAGNOSIS — Z950 Presence of cardiac pacemaker: Secondary | ICD-10-CM

## 2021-01-05 LAB — BASIC METABOLIC PANEL
Anion gap: 13 (ref 5–15)
BUN: 60 mg/dL — ABNORMAL HIGH (ref 8–23)
CO2: 24 mmol/L (ref 22–32)
Calcium: 9 mg/dL (ref 8.9–10.3)
Chloride: 102 mmol/L (ref 98–111)
Creatinine, Ser: 3.11 mg/dL — ABNORMAL HIGH (ref 0.61–1.24)
GFR, Estimated: 18 mL/min — ABNORMAL LOW (ref >60)
Glucose, Bld: 177 mg/dL — ABNORMAL HIGH (ref 70–99)
Potassium: 3.6 mmol/L (ref 3.5–5.1)
Sodium: 139 mmol/L (ref 135–145)

## 2021-01-05 LAB — GLUCOSE, CAPILLARY
Glucose-Capillary: 153 mg/dL — ABNORMAL HIGH (ref 70–99)
Glucose-Capillary: 174 mg/dL — ABNORMAL HIGH (ref 70–99)
Glucose-Capillary: 154 mg/dL — ABNORMAL HIGH (ref 70–99)
Glucose-Capillary: 149 mg/dL — ABNORMAL HIGH (ref 70–99)

## 2021-01-05 NOTE — Evaluation (Signed)
Physical Therapy Evaluation Patient Details Name: Kyle Schultz MRN: [id number] DOB: Aug 16, 1929 Today's Date: 01/05/2021   History of Present Illness  Kyle Schultz  is a 87 y.o. male, with past medical history of CKD, diabetes mellitus, complete heart block s/p pacemaker, hyperlipidemia, hypertension, patient presents to the emergency department for evaluation of worsening bilateral lower extremity edema, and overall is poor historian, patient presents to worsening lower extremity edema, patient report he is compliant with his medications, he has been having worsening lower extremity edema over the last 5 days, causing some ambulation difficulties, he denies any chest pain, shortness of breath, no fever, no cough, is not very compliant with low-salt diet given he is eating a lot of food outside, as well he is noncompliant with his Lasix and is using it as needed.  - in ED patient was +3 lower extremity edema, elevated BNP, chest x-ray significant with volume overload (questionable pneumonia), creatinine elevated at 2.34, which is at his baseline, patient was started on Lasix and [facility] hospitalist consulted to admit.   Clinical Impression   Patient presents with generalized weakness, reduced activity tolerance, increased need for physical assistance in all mobility, and inability to ambulate at this time.  Pt reports bilateral knee pain (indicates medial compartments) and limited ability to stand any length of time requiring two-assist for transfers.  Continued PT services indicated while hospitalized to improve strength, activity tolerance, balance, and initiate gait training when capable.     Follow Up Recommendations SNF    Equipment Recommendations  Other (comment) (TBD based on functional needs/outcomes)    Recommendations for Other Services       Precautions / Restrictions Precautions Precautions: Fall Restrictions Weight Bearing Restrictions: No      Mobility  Bed Mobility Overal  bed mobility: Needs Assistance Bed Mobility: Rolling;Supine to Sit;Sit to Supine Rolling: Min assist   Supine to sit: Mod assist Sit to supine: Mod assist        Transfers Overall transfer level: Needs assistance   Transfers: Sit to/from Stand;Stand Pivot Transfers Sit to Stand: Max assist Stand pivot transfers: +2 physical assistance          Ambulation/Gait Ambulation/Gait assistance: Total assist Gait Distance (Feet): 0 Feet Assistive device: Rolling walker (2 wheeled)          Stairs Stairs:  (unable to perform at this time)          Wheelchair Mobility    Modified Rankin (Stroke Patients Only)       Balance Overall balance assessment: Needs assistance Sitting-balance support: Single extremity supported Sitting balance-Leahy Scale: Fair   Postural control: Posterior lean Standing balance support: Bilateral upper extremity supported Standing balance-Leahy Scale: Zero                               Pertinent Vitals/Pain Pain Assessment: Faces Faces Pain Scale: Hurts even more Pain Location: bilateral knees Pain Descriptors / Indicators: Aching Pain Intervention(s): Limited activity within patient's tolerance;Monitored during session    Home Living Family/patient expects to be discharged to:: Private residence Living Arrangements: Alone   Type of Home: House Home Access: Level entry     Home Layout: One level Home Equipment: None      Prior Function Level of Independence: Independent         Comments: pt reports prior to admission he was still performing mobility and driving     Hand Dominance  Extremity/Trunk Assessment   Upper Extremity Assessment Upper Extremity Assessment: Generalized weakness    Lower Extremity Assessment Lower Extremity Assessment: Generalized weakness       Communication   Communication: HOH  Cognition Arousal/Alertness: Awake/alert Behavior During Therapy: WFL for tasks  assessed/performed Overall Cognitive Status: Within Functional Limits for tasks assessed                                        General Comments      Exercises General Exercises - Lower Extremity Ankle Circles/Pumps: AROM;Both;20 reps Quad Sets: Strengthening;Both;20 reps Heel Slides: AAROM;Both;20 reps   Assessment/Plan    PT Assessment Patient needs continued PT services  PT Problem List Decreased strength;Decreased activity tolerance;Decreased balance;Decreased knowledge of use of DME;Decreased mobility;Pain;Cardiopulmonary status limiting activity       PT Treatment Interventions DME instruction;Gait training;Stair training;Functional mobility training;Therapeutic activities;Patient/family education;Balance training;Therapeutic exercise;Wheelchair mobility training;Manual techniques    PT Goals (Current goals can be found in the Care Plan section)  Acute Rehab PT Goals Patient Stated Goal: "Go to rehab" PT Goal Formulation: With patient Time For Goal Achievement: 01/12/21 Potential to Achieve Goals: Good    Frequency Min 3X/week   Barriers to discharge Decreased caregiver support      Co-evaluation               AM-PAC PT "6 Clicks" Mobility  Outcome Measure Help needed turning from your back to your side while in a flat bed without using bedrails?: A Little Help needed moving from lying on your back to sitting on the side of a flat bed without using bedrails?: A Lot Help needed moving to and from a bed to a chair (including a wheelchair)?: A Lot Help needed standing up from a chair using your arms (e.g., wheelchair or bedside chair)?: A Lot Help needed to walk in hospital room?: Total Help needed climbing 3-5 steps with a railing? : Total 6 Click Score: 11    End of Session Equipment Utilized During Treatment: Gait belt Activity Tolerance: Patient limited by fatigue;Patient limited by pain Patient left: in bed;with call bell/phone within  reach;with bed alarm set Nurse Communication: Mobility status PT Visit Diagnosis: Unsteadiness on feet (R26.81);Muscle weakness (generalized) (M62.81);Difficulty in walking, not elsewhere classified (R26.2)    Time: [id number] PT Time Calculation (min) (ACUTE ONLY): 25 min   Charges:   PT Evaluation $PT Eval Low Complexity: 1 Low PT Treatments $Therapeutic Exercise: 8-22 mins $Therapeutic Activity: 8-22 mins       10:26 AM, 01/05/21 M. Sherlyn Lees, PT, DPT Physical Therapist- [place] Office Number: [phone]

## 2021-01-05 NOTE — TOC Initial Note (Signed)
Transition of Care [facility]) - Initial/Assessment Note    Patient Details  Name: Kyle Schultz MRN: [id number] Date of Birth: Jan 09, 1930  [facility]) CM/SW Contact:    Ihor Gully, LCSW Phone Number: 01/05/2021, 4:06 PM  Clinical Narrative:                 Patient from home alone. At baseline, he ambulates with a walker and drives. Admitted with fluid overload. PT recommends SNF. Patient is agreeable. Referrals sent to requested facilities.   Expected Discharge Plan: Skilled Nursing Facility Barriers to Discharge: Continued Medical Work up   Patient Goals and CMS Choice Patient states their goals for this hospitalization and ongoing recovery are:: none identified      Expected Discharge Plan and Services Expected Discharge Plan: [place] Acute Care Choice: [place] Living arrangements for the past 2 months: Single Family Home                                      Prior Living Arrangements/Services Living arrangements for the past 2 months: Single Family Home Lives with:: Self Patient language and need for interpreter reviewed:: Yes Do you feel safe going back to the place where you live?: Yes      Need for Family Participation in Patient Care: Yes (Comment) Care giver support system in place?: Yes (comment) Current home services: DME (walker) Criminal Activity/Legal Involvement Pertinent to Current Situation/Hospitalization: No - Comment as needed  Activities of Daily Living Home Assistive Devices/Equipment: Cane (specify quad or straight) (straight) ADL Screening (condition at time of admission) Patient's cognitive ability adequate to safely complete daily activities?: Yes Is the patient deaf or have difficulty hearing?: Yes Does the patient have difficulty seeing, even when wearing glasses/contacts?: Yes Does the patient have difficulty concentrating, remembering, or making decisions?: Yes Patient able to  express need for assistance with ADLs?: No Does the patient have difficulty dressing or bathing?: No Independently performs ADLs?: Yes (appropriate for developmental age) Does the patient have difficulty walking or climbing stairs?: No Weakness of Legs: Both Weakness of Arms/Hands: None  Permission Sought/Granted                  Emotional Assessment     Affect (typically observed): Appropriate Orientation: : Oriented to Self, Oriented to Place, Oriented to  Time, Oriented to Situation Alcohol / Substance Use: Not Applicable Psych Involvement: No (comment)  Admission diagnosis:  Pneumonia [J18.9] Acute on chronic diastolic CHF (congestive heart failure) ([place]) [I50.33] Acute on chronic congestive heart failure, unspecified heart failure type ([place]) [I50.9] Patient Active Problem List   Diagnosis Date Noted   Acute on chronic diastolic CHF (congestive heart failure) ([place]) 01/02/2021   History of colonic polyps 03/20/2019   Mixed hyperlipidemia 11/03/2017   Uncontrolled type 2 diabetes mellitus with stage 3 chronic kidney disease ([place]) 08/11/2017   Type 2 diabetes mellitus with other circulatory complications ([place]) [id number]   Leukopenia 03/31/2017   Chest pain 03/31/2017   SIRS (systemic inflammatory response syndrome) ([place]) 05/31/2016   Fever 05/31/2016   Chronic renal insufficiency, stage III (moderate) ([place]) 06/26/2015   Palpitations 06/26/2015   SINOATRIAL NODE DYSFUNCTION 08/01/2009   Cardiac pacemaker in situ 08/01/2009   Dyslipidemia 03/10/2009   Essential hypertension, benign 03/10/2009   PCP:  Rosita Fire, MD Pharmacy:   [facility] [place] -  [id number]  [address] [place] [address] Phone: [phone] Fax: [phone]     Social Determinants of Health (SDOH) Interventions    Readmission Risk Interventions No flowsheet data found.

## 2021-01-05 NOTE — Progress Notes (Signed)
PROGRESS NOTE    Kyle Schultz  [id number] DOB: 1929/11/04 DOA: 01/02/2021 PCP: Rosita Fire, MD    Chief Complaint  Patient presents with   Leg Swelling    Brief admission Narrative:  As per H&P written by Dr. Waldron Labs on 01/02/2021  Kyle Schultz  is a 87 y.o. male, with past medical history of CKD, diabetes mellitus, complete heart block s/p pacemaker, hyperlipidemia, hypertension, patient presents to the emergency department for evaluation of worsening bilateral lower extremity edema, and overall is poor historian, patient presents to worsening lower extremity edema, patient report he is compliant with his medications, he has been having worsening lower extremity edema over the last 5 days, causing some ambulation difficulties, he denies any chest pain, shortness of breath, no fever, no cough, is not very compliant with low-salt diet given he is eating a lot of food outside, as well he is noncompliant with his Lasix and is using it as needed. - in ED patient was +3 lower extremity edema, elevated BNP, chest x-ray significant with volume overload (questionable pneumonia), creatinine elevated at 2.34, which is at his baseline, patient was started on Lasix and [facility] hospitalist consulted to admit.  Assessment & Plan: 1-acute on chronic systolic/diastolic heart failure -Patient reports improvement in his breathing; still short of breath with activity and with significant swelling on his legs. -Extensive discussion regarding diet indiscretion, low sodium intake, medication compliance provided. -Will continue TED hoses 12 hours on and 12 hours off -Diuretics has been held in the setting of worsening renal function.  Currently no orthopnea, significant improvement/almost complete resolution of lower extremity edema and patient denies shortness of breath or requiring oxygen supplementation. -Continue close monitoring of renal function and electrolytes. -Will add bidil and low-dose  beta-blocker -not a candidate for [place] given renal dysfunction and current GFR. -2D echo demonstrating mild ventricular hypertrophy, regional wall motion and normality and decrease in his ejection fraction to 35 to 40%. -Cardiology service consulted; will follow recommendations (especially with regional wall motion abnormalities appreciated on 2D echo).  2-Essential hypertension, benign -Well-controlled currently -Continue current antihypertensive regimen. -following VS while continue adjusting medication dosages for his CHF.  3-Cardiac pacemaker in situ -Continue follow-up with cardiology service as an outpatient.  4-acute on chronic renal insufficiency, stage III (moderate) (HCC) -After aggressive diuresis -Holding Lasix for the next 24-48 hours and close monitoring of patient volume status. -Continue to minimize the use of nephrotoxic agents as much as possible.. -Repeat daily basic metabolic panel to follow electrolytes trend and stability.  5-Uncontrolled type 2 diabetes mellitus with stage 3 chronic kidney disease ([place]). -Continue modified carbohydrate diet -Continue the use of sliding scale insulin and follow CBGs fluctuation. -Given progression of his renal function not a candidate for [place].  6-hyperlipidemia -Continue Zocor.  7-history of chronic atrial fibrillation -Rate controlled. -Patient denies palpitation or CP. -Continue Eliquis for secondary prevention. -Continue telemetry monitoring.   8-physical deconditioning  -Physical therapy has seen patient and recommending a skilled nursing facility for rehab and conditioning at discharge. -Patient and family in agreement.  DVT prophylaxis: Chronically on Eliquis. Code Status: Full code Family Communication: No family at bedside during today's evaluation. Disposition:   Status is: Inpatient  Remains inpatient appropriate because:IV treatments appropriate due to intensity of illness or inability to take  PO  Dispo: The patient is from: Home              Anticipated d/c is to: Home  Patient currently is not medically stable to d/c.   Difficult to place patient No    Consultants:  Cardiology will be consulted.  Procedures:  2D echo: 1. Left ventricular ejection fraction, by estimation, is 35 to 40%. The  left ventricle has moderately decreased function. The left ventricle  demonstrates regional wall motion abnormalities (see scoring  diagram/findings for description). There is mild  left ventricular hypertrophy. Left ventricular diastolic parameters are  indeterminate.   2. Right ventricular systolic function is normal. The right ventricular  size is mildly enlarged. There is moderately elevated pulmonary artery  systolic pressure.   3. Left atrial size was severely dilated.   4. Right atrial size was severely dilated.   5. The mitral valve is normal in structure. Trivial mitral valve  regurgitation. No evidence of mitral stenosis.   6. The aortic valve is tricuspid. Aortic valve regurgitation is trivial.  Mild aortic valve sclerosis is present, with no evidence of aortic valve  stenosis.   7. Aortic dilatation noted. There is mild dilatation of the ascending  aorta, measuring 39 mm.   Antimicrobials:  None  Subjective: Patient expressed no chest pain, no nausea, no vomiting, no orthopnea.  Very little shortness of breath with exertion; no acute distress.  Expressed feeling weak, tired and deconditioned.  Objective: Vitals:   01/04/21 2120 01/05/21 0500 01/05/21 0539 01/05/21 0839  BP: (!) 96/48  106/60   Pulse:   (!) 59 61  Resp:   19   Temp:   (!) 97.5 F (36.4 C)   TempSrc:   Oral   SpO2:   100%   Weight:  83.9 kg    Height:        Intake/Output Summary (Last 24 hours) at 01/05/2021 1332 Last data filed at 01/05/2021 0900 Gross per 24 hour  Intake 600 ml  Output 800 ml  Net -200 ml   Filed Weights   01/03/21 0500 01/04/21 0500 01/05/21 0500   Weight: 88.1 kg 84.7 kg 83.9 kg    Examination: General exam: Alert, awake, oriented x 3; reports feeling weak and tired.  Currently denying orthopnea, chest pain, fever, nausea or vomiting. Respiratory system: Good air movement bilaterally, no wheezing, no crackles, no using accessory muscle.  Good saturation on room air. Cardiovascular system:RRR.  No rubs, no gallops, no JVD on exam. Gastrointestinal system: Abdomen is nondistended, soft and nontender. No organomegaly or masses felt. Normal bowel sounds heard. Central nervous system: Alert and oriented. No focal neurological deficits. Extremities: No cyanosis or clubbing; trace pedal edema appreciated only.  No joint deformity, patient reports bilateral knee pain (left more than right). Skin: No petechiae. Psychiatry: Judgement and insight appear normal. Mood & affect appropriate.   Data Reviewed: I have personally reviewed following labs and imaging studies  CBC: Recent Labs  Lab 01/02/21 1414  WBC 5.8  NEUTROABS 4.3  HGB 10.2*  HCT 32.7*  MCV 83.0  PLT 123456    Basic Metabolic Panel: Recent Labs  Lab 01/02/21 1414 01/03/21 0554 01/04/21 0615 01/05/21 0607  NA 137 141 139 139  K 3.4* 3.3* 3.2* 3.6  CL 106 105 101 102  CO2 '24 26 28 24  '$ GLUCOSE 130* 134* 173* 177*  BUN 35* 36* 43* 60*  CREATININE 2.34* 2.15* 2.64* 3.11*  CALCIUM 9.0 9.8 9.1 9.0    GFR: Estimated Creatinine Clearance: 17.7 mL/min (A) (by C-G formula based on SCr of 3.11 mg/dL (H)).   CBG: Recent Labs  Lab 01/04/21 1110 01/04/21  1621 01/04/21 2123 01/05/21 0738 01/05/21 1121  GLUCAP 172* 131* 209* 153* 174*     Recent Results (from the past 240 hour(s))  Resp Panel by RT-PCR (Flu A&B, Covid) Nasopharyngeal Swab     Status: None   Collection Time: 01/02/21  4:15 PM   Specimen: Nasopharyngeal Swab; Nasopharyngeal(NP) swabs in vial transport medium  Result Value Ref Range Status   SARS Coronavirus 2 by RT PCR NEGATIVE NEGATIVE Final     Comment: (NOTE) SARS-CoV-2 target nucleic acids are NOT DETECTED.  The SARS-CoV-2 RNA is generally detectable in upper respiratory specimens during the acute phase of infection. The lowest concentration of SARS-CoV-2 viral copies this assay can detect is 138 copies/mL. A negative result does not preclude SARS-Cov-2 infection and should not be used as the sole basis for treatment or other patient management decisions. A negative result may occur with  improper specimen collection/handling, submission of specimen other than nasopharyngeal swab, presence of viral mutation(s) within the areas targeted by this assay, and inadequate number of viral copies(<138 copies/mL). A negative result must be combined with clinical observations, patient history, and epidemiological information. The expected result is Negative.  Fact Sheet for Patients:  EntrepreneurPulse.com.au  Fact Sheet for Healthcare Providers:  IncredibleEmployment.be  This test is no t yet approved or cleared by the [place] FDA and  has been authorized for detection and/or diagnosis of SARS-CoV-2 by FDA under an Emergency Use Authorization (EUA). This EUA will remain  in effect (meaning this test can be used) for the duration of the COVID-19 declaration under Section 564(b)(1) of the Act, 21 U.S.C.section 360bbb-3(b)(1), unless the authorization is terminated  or revoked sooner.       Influenza A by PCR NEGATIVE NEGATIVE Final   Influenza B by PCR NEGATIVE NEGATIVE Final    Comment: (NOTE) The Xpert Xpress SARS-CoV-2/FLU/RSV plus assay is intended as an aid in the diagnosis of influenza from Nasopharyngeal swab specimens and should not be used as a sole basis for treatment. Nasal washings and aspirates are unacceptable for Xpert Xpress SARS-CoV-2/FLU/RSV testing.  Fact Sheet for Patients: EntrepreneurPulse.com.au  Fact Sheet for Healthcare  Providers: IncredibleEmployment.be  This test is not yet approved or cleared by the [place] FDA and has been authorized for detection and/or diagnosis of SARS-CoV-2 by FDA under an Emergency Use Authorization (EUA). This EUA will remain in effect (meaning this test can be used) for the duration of the COVID-19 declaration under Section 564(b)(1) of the Act, 21 U.S.C. section 360bbb-3(b)(1), unless the authorization is terminated or revoked.  Performed at [facility], [address], [place], [place] [address]      Radiology Studies: ECHOCARDIOGRAM COMPLETE  Result Date: 01/03/2021    ECHOCARDIOGRAM REPORT   Patient Name:   DIETRICH MARAN Date of Exam: 01/03/2021 Medical Rec #:  [id number]     Height:       72.0 in Accession #:    [id number]    Weight:       194.2 lb Date of Birth:  1930/02/16      BSA:          2.104 m Patient Age:    79 years      BP:           162/74 mmHg Patient Gender: M             HR:           60 bpm. Exam Location:  [facility] Procedure: 2D Echo, Cardiac  Doppler and Color Doppler Indications:    CHF-Acute Diastolic [id number]  History:        Patient has prior history of Echocardiogram examinations, most                 recent 08/03/2017. CHF, Pacemaker; Risk Factors:Hypertension,                 Diabetes and Dyslipidemia.  Sonographer:    Wenda Low Referring Phys: [place]  1. Left ventricular ejection fraction, by estimation, is 35 to 40%. The left ventricle has moderately decreased function. The left ventricle demonstrates regional wall motion abnormalities (see scoring diagram/findings for description). There is mild left ventricular hypertrophy. Left ventricular diastolic parameters are indeterminate.  2. Right ventricular systolic function is normal. The right ventricular size is mildly enlarged. There is moderately elevated pulmonary artery systolic pressure.  3. Left atrial size was severely dilated.  4. Right atrial  size was severely dilated.  5. The mitral valve is normal in structure. Trivial mitral valve regurgitation. No evidence of mitral stenosis.  6. The aortic valve is tricuspid. Aortic valve regurgitation is trivial. Mild aortic valve sclerosis is present, with no evidence of aortic valve stenosis.  7. Aortic dilatation noted. There is mild dilatation of the ascending aorta, measuring 39 mm. FINDINGS  Left Ventricle: Left ventricular ejection fraction, by estimation, is 35 to 40%. The left ventricle has moderately decreased function. The left ventricle demonstrates regional wall motion abnormalities. The left ventricular internal cavity size was normal in size. There is mild left ventricular hypertrophy. Abnormal (paradoxical) septal motion, consistent with RV pacemaker. Left ventricular diastolic parameters are indeterminate.  LV Wall Scoring: The entire anterior septum, mid inferoseptal segment, apical anterior segment, apical inferior segment, and apex are akinetic. The anterior wall, entire lateral wall, inferior wall, and basal inferoseptal segment are normal. Right Ventricle: The right ventricular size is mildly enlarged. No increase in right ventricular wall thickness. Right ventricular systolic function is normal. There is moderately elevated pulmonary artery systolic pressure. The tricuspid regurgitant velocity is 3.45 m/s, and with an assumed right atrial pressure of 8 mmHg, the estimated right ventricular systolic pressure is 123456 mmHg. Left Atrium: Left atrial size was severely dilated. Right Atrium: Right atrial size was severely dilated. Pericardium: Trivial pericardial effusion is present. Mitral Valve: The mitral valve is normal in structure. Trivial mitral valve regurgitation. No evidence of mitral valve stenosis. MV peak gradient, 3.8 mmHg. The mean mitral valve gradient is 1.0 mmHg. Tricuspid Valve: The tricuspid valve is normal in structure. Tricuspid valve regurgitation is trivial. Aortic Valve:  The aortic valve is tricuspid. Aortic valve regurgitation is trivial. Mild aortic valve sclerosis is present, with no evidence of aortic valve stenosis. Aortic valve mean gradient measures 2.0 mmHg. Aortic valve peak gradient measures 5.9 mmHg. Pulmonic Valve: The pulmonic valve was not well visualized. Pulmonic valve regurgitation is not visualized. Aorta: The aortic root is normal in size and structure and aortic dilatation noted. There is mild dilatation of the ascending aorta, measuring 39 mm. IAS/Shunts: The interatrial septum was not well visualized.  LEFT VENTRICLE PLAX 2D LVIDd:         5.22 cm LV PW:         1.22 cm LV IVS:        1.23 cm  LV Volumes (MOD) LV vol d, MOD A2C: 117.0 ml LV vol d, MOD A4C: 98.6 ml LV vol s, MOD A2C: 69.2 ml LV vol s, MOD  A4C: 63.4 ml LV SV MOD A2C:     47.8 ml LV SV MOD A4C:     98.6 ml LV SV MOD BP:      37.7 ml RIGHT VENTRICLE TAPSE (M-mode): 2.2 cm LEFT ATRIUM            Index       RIGHT ATRIUM           Index LA Vol (A2C): 131.0 ml 62.26 ml/m RA Area:     26.00 cm LA Vol (A4C): 79.1 ml  37.59 ml/m RA Volume:   99.10 ml  47.10 ml/m  AORTIC VALVE AV Vmax:           121.00 cm/s AV Vmean:          70.400 cm/s AV VTI:            0.203 m AV Peak Grad:      5.9 mmHg AV Mean Grad:      2.0 mmHg LVOT Vmax:         90.20 cm/s LVOT Vmean:        56.200 cm/s LVOT VTI:          0.156 m LVOT/AV VTI ratio: 0.77  AORTA Ao Root diam: 3.60 cm Ao Asc diam:  3.90 cm MITRAL VALVE               TRICUSPID VALVE MV Area (PHT): 4.93 cm    TR Peak grad:   47.6 mmHg MV Peak grad:  3.8 mmHg    TR Vmax:        345.00 cm/s MV Mean grad:  1.0 mmHg MV Vmax:       0.97 m/s    SHUNTS MV Vmean:      41.3 cm/s   Systemic VTI: 0.16 m MV Decel Time: 154 msec MV E velocity: 91.20 cm/s MV A velocity: 53.80 cm/s MV E/A ratio:  1.70 Oswaldo Milian MD Electronically signed by Oswaldo Milian MD Signature Date/Time: 01/03/2021/2:24:03 PM    Final     Scheduled Meds:  apixaban  2.5 mg Oral BID    furosemide  40 mg Intravenous Daily   insulin aspart  0-15 Units Subcutaneous TID WC   insulin aspart  0-5 Units Subcutaneous QHS   isosorbide-hydrALAZINE  1 tablet Oral BID   losartan  50 mg Oral Daily   metoprolol tartrate  12.5 mg Oral BID   simvastatin  20 mg Oral QHS   sodium chloride flush  3 mL Intravenous Q12H   Continuous Infusions:  sodium chloride       LOS: 3 days    Time spent: 35 minutes   Barton Dubois, MD [facility]   To contact the attending provider between 7A-7P or the covering provider during after hours 7P-7A, please log into the web site [email or web address] and access using universal [place] password for that web site. If you do not have the password, please call the hospital operator.  01/05/2021, 1:32 PM

## 2021-01-05 NOTE — Care Management Important Message (Signed)
Important Message  Patient Details  Name: Kyle Schultz MRN: [id number] Date of Birth: [birth year]   Medicare Important Message Given:  Yes     Tommy Medal 01/05/2021, 12:20 PM

## 2021-01-05 NOTE — Plan of Care (Signed)
  Problem: Acute Rehab PT Goals(only PT should resolve) Goal: Pt will Roll Supine to Side Outcome: Progressing Flowsheets (Taken 01/05/2021 1028) Pt will Roll Supine to Side: with modified independence Goal: Pt Will Go Supine/Side To Sit Outcome: Progressing Flowsheets (Taken 01/05/2021 1028) Pt will go Supine/Side to Sit: with supervision Goal: Pt Will Go Sit To Supine/Side Outcome: Progressing Flowsheets (Taken 01/05/2021 1028) Pt will go Sit to Supine/Side: with supervision Goal: Patient Will Transfer Sit To/From Stand Outcome: Progressing Flowsheets (Taken 01/05/2021 1028) Patient will transfer sit to/from stand: with min guard assist Goal: Pt Will Transfer Bed To Chair/Chair To Bed Outcome: Progressing Flowsheets (Taken 01/05/2021 1028) Pt will Transfer Bed to Chair/Chair to Bed: min guard assist Goal: Pt Will Ambulate Outcome: Progressing Flowsheets (Taken 01/05/2021 1028) Pt will Ambulate:  50 feet  with min guard assist  with rolling walker   10:29 AM, 01/05/21 M. Sherlyn Lees, PT, DPT Physical Therapist- [place] Office Number: [phone]

## 2021-01-05 NOTE — NC FL2 (Signed)
[place] LEVEL OF CARE SCREENING TOOL     IDENTIFICATION  Patient Name: Kyle Schultz Birthdate: [birth year] Sex: male Admission Date (Current Location): 01/02/2021  [facility] and [place] Number:  [employer] and Address:  [facility] [address] [place]      Provider Number: [id number]  Attending Physician Name and Address:  Barton Dubois, MD  Relative Name and Phone Number:  Noah Delaine     [phone]    Current Level of Care: Hospital Recommended Level of Care: [place] Prior Approval Number:    Date Approved/Denied:   PASRR Number: [id number]  Discharge Plan: SNF    Current Diagnoses: Patient Active Problem List   Diagnosis Date Noted   Acute on chronic diastolic CHF (congestive heart failure) ([place]) 01/02/2021   History of colonic polyps 03/20/2019   Mixed hyperlipidemia 11/03/2017   Uncontrolled type 2 diabetes mellitus with stage 3 chronic kidney disease ([place]) 08/11/2017   Type 2 diabetes mellitus with other circulatory complications ([place]) [id number]   Leukopenia 03/31/2017   Chest pain 03/31/2017   SIRS (systemic inflammatory response syndrome) (HCC) 05/31/2016   Fever 05/31/2016   Chronic renal insufficiency, stage III (moderate) ([place]) 06/26/2015   Palpitations 06/26/2015   SINOATRIAL NODE DYSFUNCTION 08/01/2009   Cardiac pacemaker in situ 08/01/2009   Dyslipidemia 03/10/2009   Essential hypertension, benign 03/10/2009    Orientation RESPIRATION BLADDER Height & Weight     Self, Time, Situation, Place  Normal Incontinent Weight: 184 lb 15.5 oz (83.9 kg) Height:  6' (182.9 cm)  BEHAVIORAL SYMPTOMS/MOOD NEUROLOGICAL BOWEL NUTRITION STATUS      Continent Diet (heart healthy/carb modified)  AMBULATORY STATUS COMMUNICATION OF NEEDS Skin   Total Care Verbally Normal                       Personal Care Assistance Level of Assistance  Bathing, Feeding, Dressing Bathing  Assistance: Limited assistance Feeding assistance: Independent Dressing Assistance: Limited assistance     Functional Limitations Info  Sight, Hearing, Speech Sight Info: Adequate Hearing Info: Adequate Speech Info: Adequate    SPECIAL CARE FACTORS FREQUENCY  PT (By licensed PT)     PT Frequency: 5x/week              Contractures Contractures Info: Not present    Additional Factors Info  Code Status, Allergies Code Status Info: Full Code Allergies Info: "some kind of fluid pill I can't take"           Current Medications (01/05/2021):  This is the current hospital active medication list Current Facility-Administered Medications  Medication Dose Route Frequency Provider Last Rate Last Admin   0.9 %  sodium chloride infusion  250 mL Intravenous PRN Elgergawy, Silver Huguenin, MD       acetaminophen (TYLENOL) tablet 650 mg  650 mg Oral Q4H PRN Elgergawy, Silver Huguenin, MD       apixaban (ELIQUIS) tablet 2.5 mg  2.5 mg Oral BID Elgergawy, Silver Huguenin, MD   2.5 mg at 01/05/21 0841   insulin aspart (novoLOG) injection 0-15 Units  0-15 Units Subcutaneous TID WC Elgergawy, Silver Huguenin, MD   3 Units at 01/05/21 1152   insulin aspart (novoLOG) injection 0-5 Units  0-5 Units Subcutaneous QHS Elgergawy, Silver Huguenin, MD   2 Units at 01/04/21 2136   isosorbide-hydrALAZINE (BIDIL) 20-37.5 MG per tablet 1 tablet  1 tablet Oral BID Barton Dubois, MD   1 tablet at  01/05/21 0845   metoprolol tartrate (LOPRESSOR) tablet 12.5 mg  12.5 mg Oral BID Barton Dubois, MD   12.5 mg at 01/05/21 [id number]   Muscle Rub CREA   Topical BID PRN Barton Dubois, MD       ondansetron [facility]) injection 4 mg  4 mg Intravenous Q6H PRN Elgergawy, Silver Huguenin, MD       simvastatin (ZOCOR) tablet 20 mg  20 mg Oral QHS Elgergawy, Silver Huguenin, MD   20 mg at 01/04/21 2125   sodium chloride flush (NS) 0.9 % injection 3 mL  3 mL Intravenous Q12H Elgergawy, Silver Huguenin, MD   3 mL at 01/05/21 1000   sodium chloride flush (NS) 0.9 % injection 3 mL  3 mL  Intravenous PRN Elgergawy, Silver Huguenin, MD         Discharge Medications: Please see discharge summary for a list of discharge medications.  Relevant Imaging Results:  Relevant Lab Results:   Additional Information SSN [id number] Clydene Pugh, LCSW

## 2021-01-05 NOTE — Consult Note (Addendum)
Cardiology Consultation:   Patient ID: Kyle Schultz MRN: [id number]; DOB: [birth year]  Admit date: 01/02/2021 Date of Consult: 01/05/2021  PCP:  Rosita Fire, MD   [facility] Providers Cardiologist:  None  Electrophysiologist:  Cristopher Peru, MD       Patient Profile:   Kyle Schultz is an 87 y.o. male with a hx of permanent pacemaker who is being seen 01/05/2021 for the evaluation of acute CHF at the request of Dr. Dyann Kief.  History of Present Illness:   Kyle Schultz is an 87 year old male patient of Dr. Tanna Furry who has a permanent pacemaker for complete heart block, permanent atrial fibrillation/flutter, CKD, and hypertension.  Patient admitted with acute systolic and diastolic CHF.  2D echo 01/03/2021 shows new LV dysfunction EF 35 to 40% with mild LVH, moderately elevated pulmonary artery systolic pressure, down from echo in 2019 EF 50 to 55%  Patient was last seen by Dr. Lovena Le 10/28/2020 and doing well.  Remote device check 12/25/2020 histograms were appropriate no recommended changes.  Patient says he came in because of leg swelling. No chest pain, dyspnea, dizziness or presyncope. Still driving and eating out daily. Trying to get into [facility]. Followed by renal.   Past Medical History:  Diagnosis Date   Atrial fibrillation and flutter (HCC)    Cardiomyopathy ([place])    CKD (chronic kidney disease) stage 3, GFR 30-59 ml/min (HCC)    Complete heart block (HCC)    Diabetes mellitus    Hyperlipidemia    Hypertension    Pacemaker    Sinus bradycardia     Past Surgical History:  Procedure Laterality Date   COLONOSCOPY  2010   Two 4 mm sessile hepatic flexure polyps, one slightly pedunculated 6 to 8 mm sessile descending colon polyp, small internal hemorrhoids. Simple adenomas.    colonscopy     HEMORRHOID SURGERY     PACEMAKER INSERTION  [address] jude   PPM GENERATOR CHANGEOUT N/A 09/27/2019   Procedure: PPM GENERATOR CHANGEOUT;  Surgeon: Evans Lance, MD;   Location: [place] [facility];  Service: Cardiovascular;  Laterality: N/A;   UMBILICAL HERNIA REPAIR  04/2008     Home Medications:  Prior to Admission medications   Medication Sig Start Date End Date Taking? Authorizing Provider  apixaban (ELIQUIS) 2.5 MG TABS tablet Take 1 tablet (2.5 mg total) by mouth 2 (two) times daily. 10/28/20  Yes Evans Lance, MD  carbamide peroxide (DEBROX) 6.5 % OTIC solution Place 5 drops into both ears 2 (two) times daily. 07/05/19  Yes Domenic Moras, PA-C  docusate sodium (COLACE) 100 MG capsule Take 100 mg by mouth daily as needed for mild constipation.   Yes [provider]  furosemide (LASIX) 40 MG tablet Take 1 tablet (40 mg total) by mouth 2 (two) times daily for 14 days. 03/06/20 01/02/21 Yes Noemi Chapel, MD  glipiZIDE (GLUCOTROL) 5 MG tablet Take 1 tablet (5 mg total) by mouth 2 (two) times daily with a meal. 07/10/18  Yes Nida, Marella Chimes, MD  linagliptin (TRADJENTA) 5 MG TABS tablet Take 5 mg by mouth daily.   Yes [provider]  losartan (COZAAR) 50 MG tablet Take 1 tablet (50 mg total) by mouth daily. 10/28/20 10/28/21 Yes Evans Lance, MD  simvastatin (ZOCOR) 20 MG tablet Take 20 mg by mouth at bedtime. 10/04/20  Yes [provider]  glucose blood test strip 1 each by Other route 2 (two) times daily. Use as instructed  2 x daily. E11.65 EZ MAX 05/01/18   Cassandria Anger, MD  simvastatin (ZOCOR) 10 MG tablet Take by mouth. Patient not taking: Reported on 01/02/2021    [provider]    Inpatient Medications: Scheduled Meds:  apixaban  2.5 mg Oral BID   insulin aspart  0-15 Units Subcutaneous TID WC   insulin aspart  0-5 Units Subcutaneous QHS   isosorbide-hydrALAZINE  1 tablet Oral BID   metoprolol tartrate  12.5 mg Oral BID   simvastatin  20 mg Oral QHS   sodium chloride flush  3 mL Intravenous Q12H   Continuous Infusions:  sodium chloride     PRN Meds: sodium chloride, acetaminophen, Muscle  Rub, ondansetron (ZOFRAN) IV, sodium chloride flush  Allergies:    Allergies  Allergen Reactions   Other     "some kind of fluid pill I can't take"     Social History:   Social History   Tobacco Use   Smoking status: Never   Smokeless tobacco: Never  Substance Use Topics   Alcohol use: No    Alcohol/week: 0.0 standard drinks     Family History:     Family History  Problem Relation Age of Onset   Heart disease Father    Cancer Sister    Cancer Sister    Cancer Sister    ALS Sister    Colon cancer Neg Hx      ROS:  Please see the history of present illness.  Review of Systems  Constitutional: Negative.  HENT:  Positive for hearing loss.   Eyes:  Positive for visual disturbance.  Cardiovascular:  Positive for leg swelling.  Respiratory: Negative.    Endocrine: Negative.   Hematologic/Lymphatic: Negative.   Musculoskeletal: Negative.   Gastrointestinal: Negative.   Genitourinary: Negative.   Neurological:  Positive for weakness.   All other ROS reviewed and negative.     Physical Exam/Data:   Vitals:   01/04/21 2120 01/05/21 0500 01/05/21 0539 01/05/21 0839  BP: (!) 96/48  106/60   Pulse:   (!) 59 61  Resp:   19   Temp:   (!) 97.5 F (36.4 C)   TempSrc:   Oral   SpO2:   100%   Weight:  83.9 kg    Height:        Intake/Output Summary (Last 24 hours) at 01/05/2021 1550 Last data filed at 01/05/2021 1300 Gross per 24 hour  Intake 840 ml  Output 800 ml  Net 40 ml   Last 3 Weights 01/05/2021 01/04/2021 01/03/2021  Weight (lbs) 184 lb 15.5 oz 186 lb 11.7 oz 194 lb 3.6 oz  Weight (kg) 83.9 kg 84.7 kg 88.1 kg     Body mass index is 25.09 kg/m.  General:  Well nourished, well developed, in no acute distress  HEENT: normal Lymph: no adenopathy Neck: no JVD Endocrine:  No thryomegaly Vascular: No carotid bruits; FA pulses 2+ bilaterally without bruits  Cardiac:  normal S1, S2; RRR; 2/6 systolic murmur apex Lungs:  clear to auscultation bilaterally, no  wheezing, rhonchi or rales  Abd: soft, nontender, no hepatomegaly  Ext: no edema Musculoskeletal:  No deformities, BUE and BLE strength normal and equal Skin: warm and dry  Neuro:  CNs 2-12 intact, no focal abnormalities noted Psych:  Normal affect   EKG:  The EKG was personally reviewed and demonstrates:  paced with underlying atrial flutter Telemetry:  Telemetry was personally reviewed and demonstrates:  Afib/flutter with PVC's  Relevant CV  Studies: 2Decho 01/03/21 IMPRESSIONS    1. Left ventricular ejection fraction, by estimation, is 35 to 40%. The  left ventricle has moderately decreased function. The left ventricle  demonstrates regional wall motion abnormalities (see scoring  diagram/findings for description). There is mild  left ventricular hypertrophy. Left ventricular diastolic parameters are  indeterminate.   2. Right ventricular systolic function is normal. The right ventricular  size is mildly enlarged. There is moderately elevated pulmonary artery  systolic pressure.   3. Left atrial size was severely dilated.   4. Right atrial size was severely dilated.   5. The mitral valve is normal in structure. Trivial mitral valve  regurgitation. No evidence of mitral stenosis.   6. The aortic valve is tricuspid. Aortic valve regurgitation is trivial.  Mild aortic valve sclerosis is present, with no evidence of aortic valve  stenosis.   7. Aortic dilatation noted. There is mild dilatation of the ascending  aorta, measuring 39 mm. cho 01/03/21  Laboratory Data:  High Sensitivity Troponin:   Recent Labs  Lab 01/02/21 1419 01/02/21 1619  TROPONINIHS 118* 133*     Chemistry Recent Labs  Lab 01/03/21 0554 01/04/21 0615 01/05/21 0607  NA 141 139 139  K 3.3* 3.2* 3.6  CL 105 101 102  CO2 '26 28 24  '$ GLUCOSE 134* 173* 177*  BUN 36* 43* 60*  CREATININE 2.15* 2.64* 3.11*  CALCIUM 9.8 9.1 9.0  GFRNONAA 29* 22* 18*  ANIONGAP '10 10 13    '$ No results for input(s): PROT,  ALBUMIN, AST, ALT, ALKPHOS, BILITOT in the last 168 hours. Hematology Recent Labs  Lab 01/02/21 1414  WBC 5.8  RBC 3.94*  HGB 10.2*  HCT 32.7*  MCV 83.0  MCH 25.9*  MCHC 31.2  RDW 15.9*  PLT 196   BNP Recent Labs  Lab 01/02/21 1414  BNP 534.0*     Radiology/Studies:  DG Chest 2 View  Result Date: 01/02/2021 CLINICAL DATA:  Shortness of breath. Weakness. Bilateral leg swelling. EXAM: CHEST - 2 VIEW COMPARISON:  09/24/2017 FINDINGS: Decreased inspiration. No gross change in enlargement of the cardiac silhouette. The aorta remains mildly tortuous and calcified. Stable left subclavian bipolar pacemaker leads. Stable mildly prominent interstitial markings and stable calcified granuloma at the right lung base. Interval minimal patchy linear density in the right mid lung zone laterally. No acute bony abnormality. IMPRESSION: 1. Interval minimal linear atelectasis, pneumonia or scarring in the right mid lung zone laterally. Follow-up chest radiographs are recommended in 3-4 weeks to evaluate for resolution. 2. Stable cardiomegaly and mild chronic interstitial lung disease. Electronically Signed   By: Claudie Revering M.D.   On: 01/02/2021 14:39   CT Head Wo Contrast  Result Date: 01/02/2021 CLINICAL DATA:  Delirium, increased drowsiness, BILATERAL leg swelling, diabetes mellitus, hypertension EXAM: CT HEAD WITHOUT CONTRAST TECHNIQUE: Contiguous axial images were obtained from the base of the skull through the vertex without intravenous contrast. Sagittal and coronal MPR images reconstructed from axial data set. COMPARISON:  11/24/2019 FINDINGS: Brain: Generalized atrophy. Normal ventricular morphology. No midline shift or mass effect. Mild small vessel chronic ischemic changes of deep cerebral white matter. No intracranial hemorrhage, mass lesion, or evidence of acute infarction. No extra-axial fluid collections. Vascular: No hyperdense vessels. Atherosclerotic calcification of internal carotid  arteries bilaterally at skull base Skull: Calvaria intact Sinuses/Orbits: Scattered mucosal thickening in ethmoid air cells. Mucosal retention cyst RIGHT maxillary sinus. Remaining paranasal sinuses and mastoid air cells clear Other: N/A IMPRESSION: Atrophy with mild small vessel  chronic ischemic changes of deep cerebral white matter. No acute intracranial abnormalities. Electronically Signed   By: Lavonia Dana M.D.   On: 01/02/2021 17:05   DG Chest Port 1 View  Result Date: 01/03/2021 CLINICAL DATA:  Pneumonia EXAM: PORTABLE CHEST 1 VIEW COMPARISON:  01/02/2021 FINDINGS: There is progressive asymmetric interstitial infiltrate within the left lung base, possibly infectious in the appropriate clinical setting. Benign calcified granuloma within the right lung base again noted. No pneumothorax or pleural effusion. Cardiac size within normal limits. Left subclavian dual lead pacemaker unchanged. There is central pulmonary vascular congestion again noted without superimposed overt pulmonary edema. No acute bone abnormality. IMPRESSION: Progressive left basilar pulmonary infiltrate, possibly infectious in the appropriate clinical setting. Persistent central pulmonary vascular congestion without overt pulmonary edema. Electronically Signed   By: Fidela Salisbury MD   On: 01/03/2021 06:02   ECHOCARDIOGRAM COMPLETE  Result Date: 01/03/2021    ECHOCARDIOGRAM REPORT   Patient Name:   Kyle Schultz Date of Exam: 01/03/2021 Medical Rec #:  [id number]     Height:       72.0 in Accession #:    [id number]    Weight:       194.2 lb Date of Birth:  [birth year]      BSA:          2.104 m Patient Age:    12 years      BP:           162/74 mmHg Patient Gender: M             HR:           60 bpm. Exam Location:  [facility] Procedure: 2D Echo, Cardiac Doppler and Color Doppler Indications:    CHF-Acute Diastolic [id number]  History:        Patient has prior history of Echocardiogram examinations, most                 recent 08/03/2017. CHF,  Pacemaker; Risk Factors:Hypertension,                 Diabetes and Dyslipidemia.  Sonographer:    Wenda Low Referring Phys: [place]  1. Left ventricular ejection fraction, by estimation, is 35 to 40%. The left ventricle has moderately decreased function. The left ventricle demonstrates regional wall motion abnormalities (see scoring diagram/findings for description). There is mild left ventricular hypertrophy. Left ventricular diastolic parameters are indeterminate.  2. Right ventricular systolic function is normal. The right ventricular size is mildly enlarged. There is moderately elevated pulmonary artery systolic pressure.  3. Left atrial size was severely dilated.  4. Right atrial size was severely dilated.  5. The mitral valve is normal in structure. Trivial mitral valve regurgitation. No evidence of mitral stenosis.  6. The aortic valve is tricuspid. Aortic valve regurgitation is trivial. Mild aortic valve sclerosis is present, with no evidence of aortic valve stenosis.  7. Aortic dilatation noted. There is mild dilatation of the ascending aorta, measuring 39 mm. FINDINGS  Left Ventricle: Left ventricular ejection fraction, by estimation, is 35 to 40%. The left ventricle has moderately decreased function. The left ventricle demonstrates regional wall motion abnormalities. The left ventricular internal cavity size was normal in size. There is mild left ventricular hypertrophy. Abnormal (paradoxical) septal motion, consistent with RV pacemaker. Left ventricular diastolic parameters are indeterminate.  LV Wall Scoring: The entire anterior septum, mid inferoseptal segment, apical anterior segment, apical inferior segment, and apex are akinetic. The  anterior wall, entire lateral wall, inferior wall, and basal inferoseptal segment are normal. Right Ventricle: The right ventricular size is mildly enlarged. No increase in right ventricular wall thickness. Right ventricular systolic  function is normal. There is moderately elevated pulmonary artery systolic pressure. The tricuspid regurgitant velocity is 3.45 m/s, and with an assumed right atrial pressure of 8 mmHg, the estimated right ventricular systolic pressure is 123456 mmHg. Left Atrium: Left atrial size was severely dilated. Right Atrium: Right atrial size was severely dilated. Pericardium: Trivial pericardial effusion is present. Mitral Valve: The mitral valve is normal in structure. Trivial mitral valve regurgitation. No evidence of mitral valve stenosis. MV peak gradient, 3.8 mmHg. The mean mitral valve gradient is 1.0 mmHg. Tricuspid Valve: The tricuspid valve is normal in structure. Tricuspid valve regurgitation is trivial. Aortic Valve: The aortic valve is tricuspid. Aortic valve regurgitation is trivial. Mild aortic valve sclerosis is present, with no evidence of aortic valve stenosis. Aortic valve mean gradient measures 2.0 mmHg. Aortic valve peak gradient measures 5.9 mmHg. Pulmonic Valve: The pulmonic valve was not well visualized. Pulmonic valve regurgitation is not visualized. Aorta: The aortic root is normal in size and structure and aortic dilatation noted. There is mild dilatation of the ascending aorta, measuring 39 mm. IAS/Shunts: The interatrial septum was not well visualized.  LEFT VENTRICLE PLAX 2D LVIDd:         5.22 cm LV PW:         1.22 cm LV IVS:        1.23 cm  LV Volumes (MOD) LV vol d, MOD A2C: 117.0 ml LV vol d, MOD A4C: 98.6 ml LV vol s, MOD A2C: 69.2 ml LV vol s, MOD A4C: 63.4 ml LV SV MOD A2C:     47.8 ml LV SV MOD A4C:     98.6 ml LV SV MOD BP:      37.7 ml RIGHT VENTRICLE TAPSE (M-mode): 2.2 cm LEFT ATRIUM            Index       RIGHT ATRIUM           Index LA Vol (A2C): 131.0 ml 62.26 ml/m RA Area:     26.00 cm LA Vol (A4C): 79.1 ml  37.59 ml/m RA Volume:   99.10 ml  47.10 ml/m  AORTIC VALVE AV Vmax:           121.00 cm/s AV Vmean:          70.400 cm/s AV VTI:            0.203 m AV Peak Grad:      5.9  mmHg AV Mean Grad:      2.0 mmHg LVOT Vmax:         90.20 cm/s LVOT Vmean:        56.200 cm/s LVOT VTI:          0.156 m LVOT/AV VTI ratio: 0.77  AORTA Ao Root diam: 3.60 cm Ao Asc diam:  3.90 cm MITRAL VALVE               TRICUSPID VALVE MV Area (PHT): 4.93 cm    TR Peak grad:   47.6 mmHg MV Peak grad:  3.8 mmHg    TR Vmax:        345.00 cm/s MV Mean grad:  1.0 mmHg MV Vmax:       0.97 m/s    SHUNTS MV Vmean:      41.3 cm/s  Systemic VTI: 0.16 m MV Decel Time: 154 msec MV E velocity: 91.20 cm/s MV A velocity: 53.80 cm/s MV E/A ratio:  1.70 Oswaldo Milian MD Electronically signed by Oswaldo Milian MD Signature Date/Time: 01/03/2021/2:24:03 PM    Final      Assessment and Plan:   Acute systolic CHF/new cardiomyopathy with new reduction in LVEF 35 to 40% on echo 01/03/2021.  Given age and creatinine 3.11 will not plan aggressive work-up but treat medically. Patient had been eating out daily. CHF currently compensated.I/O's negative 390cc. Diuretics on hold since rising Crt.Trying to get into [facility]. BP too low to consider any more treatment and with rising Crt probably not a candidate for entresto although he's been on losartan  Permanent atrial fibrillation/flutter on low dose eliquis  Essential hypertension BP running low on bidil and losartan and metoprolol. Will hold losartan. Would ask renal opinion  Permanent pacemaker for complete heart block followed by Dr. Samule Dry pacer check stable 12/25/20  CKD stage III Crt 3.11 today with diuresis  DM uncontrolled  Hyperlipidemia   Risk Assessment/Risk Scores:      New York Heart Association (NYHA) Functional Class NYHA Class III  CHA2DS2-VASc Score = 5  This indicates a 7.2% annual risk of stroke. The patient's score is based upon: CHF History: Yes HTN History: Yes Diabetes History: Yes Stroke History: No Vascular Disease History: No Age Score: 2 Gender Score: 0     For questions or updates, please contact [place] Please consult [email or web address] for contact info under    Signed, Ermalinda Barrios, PA-C 01/05/2021 3:50 PM   Attending note:  Patient seen and examined.  I reviewed his records and discussed the case with Ms. Vita Barley, I agree with her above findings.  Kyle Schultz is a patient of Dr. Lovena Le with history of complete heart block status post pacemaker as well as permanent atrial fibrillation/flutter with CHA2DS2-VASc score of 5 on Eliquis for stroke prophylaxis.  He presents to the hospital complaining of progressive leg swelling and has evidence of worsening cardiomyopathy by follow-up echocardiogram in comparison to previous study in 2019.  LVEF currently in the range of 35 to 40% with septal motion consistent with RV pacing and also wall motion or normalities raising possibility of underlying ischemic heart disease.  He has severe biatrial enlargement as well and evidence of moderate pulmonary hypertension.  He was admitted to the hospitalist service for IV diuresis and has improved in terms of volume status although renal function has worsened.  He follows with Dr. Theador Hawthorne, was last seen in April.  He has CKD stage IIIb-IV as well as proteinuria, has been continued on losartan as an outpatient.  He has clinically improved with IV Lasix, net urine output incomplete however.  Weight down about 4 pounds.  He is afebrile, systolic blood pressure 99991111 most recently, heart rate in the 60s with ventricular paced rhythm by telemetry.  Decreased breath sounds left base.  Pertinent lab work includes potassium 3.6, BUN 60, creatinine 3.11 up from 2.34 at presentation.  High-sensitivity troponin I 118 and 133, BNP 534, hemoglobin 10.2, platelets 196.  ECG from 01/02/2021 shows a ventricular paced rhythm with underlying atrial fibrillation/flutter.  Chest x-ray reports left basilar infiltrate as well as central pulmonary vascular congestion.  Patient presents with acute on possibly chronic systolic  heart failure, newly diagnosed cardiomyopathy with LVEF 35 to 40% in comparison to 2019.  Wall motion abnormalities raise the possibility of underlying ischemic heart disease, although high-sensitivity troponin  I levels are not suggestive of ACS.  Could be related to RV pacing as well (98% by recent device check).  Given advanced age and progressive renal insufficiency, would favor conservative management and medical therapy adjustments rather than pursuing aggressive ischemic evaluation.  He has been on losartan under the direction of Dr. Theador Hawthorne with CKD stage IIIb-IV as well as proteinuria, would suggest holding for the time being given acute kidney injury in the face of diuresis.  Agree with BiDil however and if he tolerates current dose of Lopressor, can convert to Toprol-XL.  Continue Eliquis for stroke prophylaxis.  Not candidate for SGLT2 inhibitor or Aldactone.  Might ultimately be considered for an attempted switch from losartan to [facility] presuming renal function returns to prior baseline.  Satira Sark, M.D., F.A.C.C.

## 2021-01-06 DIAGNOSIS — I509 Heart failure, unspecified: Secondary | ICD-10-CM | POA: Insufficient documentation

## 2021-01-06 LAB — BASIC METABOLIC PANEL
Anion gap: 10 (ref 5–15)
BUN: 79 mg/dL — ABNORMAL HIGH (ref 8–23)
CO2: 25 mmol/L (ref 22–32)
Calcium: 8.9 mg/dL (ref 8.9–10.3)
Chloride: 104 mmol/L (ref 98–111)
Creatinine, Ser: 3.31 mg/dL — ABNORMAL HIGH (ref 0.61–1.24)
GFR, Estimated: 17 mL/min — ABNORMAL LOW (ref >60)
Glucose, Bld: 158 mg/dL — ABNORMAL HIGH (ref 70–99)
Potassium: 3.7 mmol/L (ref 3.5–5.1)
Sodium: 139 mmol/L (ref 135–145)

## 2021-01-06 LAB — GLUCOSE, CAPILLARY
Glucose-Capillary: 143 mg/dL — ABNORMAL HIGH (ref 70–99)
Glucose-Capillary: 143 mg/dL — ABNORMAL HIGH (ref 70–99)
Glucose-Capillary: 140 mg/dL — ABNORMAL HIGH (ref 70–99)
Glucose-Capillary: 116 mg/dL — ABNORMAL HIGH (ref 70–99)

## 2021-01-06 NOTE — Progress Notes (Signed)
Progress Note  Patient Name: Kyle Schultz Date of Encounter: 01/06/2021  PCP:  Rosita Fire, MD [facility] Cardiologist: None  Electrophysiologist:  Cristopher Peru, MD  Subjective   Denies any chest pain or resting SOB  Inpatient Medications    Scheduled Meds:  apixaban  2.5 mg Oral BID   insulin aspart  0-15 Units Subcutaneous TID WC   insulin aspart  0-5 Units Subcutaneous QHS   isosorbide-hydrALAZINE  1 tablet Oral BID   metoprolol tartrate  12.5 mg Oral BID   simvastatin  20 mg Oral QHS   sodium chloride flush  3 mL Intravenous Q12H   Continuous Infusions:  sodium chloride     PRN Meds: sodium chloride, acetaminophen, Muscle Rub, ondansetron (ZOFRAN) IV, sodium chloride flush   Vital Signs    Vitals:   01/05/21 0839 01/05/21 2130 01/06/21 0500 01/06/21 0804  BP:  (!) 92/48  122/74  Pulse: 61 63    Resp:      Temp:  (!) 97.4 F (36.3 C)    TempSrc:  Oral    SpO2:  99%    Weight:   84.2 kg   Height:        Intake/Output Summary (Last 24 hours) at 01/06/2021 0852 Last data filed at 01/05/2021 2153 Gross per 24 hour  Intake 730 ml  Output --  Net 730 ml   Last 3 Weights 01/06/2021 01/05/2021 01/04/2021  Weight (lbs) 185 lb 10 oz 184 lb 15.5 oz 186 lb 11.7 oz  Weight (kg) 84.2 kg 83.9 kg 84.7 kg      Telemetry    Telemetry:  Telemetry was personally reviewed and demonstrates:  Paced rhythm, HR 60s  ECG    The EKG was personally reviewed and demonstrates: paced with underlying atrial flutter  Physical Exam   GEN: No acute distress.   Neck: No JVD Cardiac: RRR, 1/6 systolic murmur Respiratory: Clear to auscultation bilaterally. GI: Soft, nontender, non-distended  MS: Trace to 1+ edema B/L LEs; No deformity. Neuro:  Nonfocal  Psych: Normal affect   Labs    High Sensitivity Troponin:   Recent Labs  Lab 01/02/21 1419 01/02/21 1619  TROPONINIHS 118* 133*      Chemistry Recent Labs  Lab 01/04/21 0615 01/05/21 0607 01/06/21 0536   NA 139 139 139  K 3.2* 3.6 3.7  CL 101 102 104  CO2 '28 24 25  '$ GLUCOSE 173* 177* 158*  BUN 43* 60* 79*  CREATININE 2.64* 3.11* 3.31*  CALCIUM 9.1 9.0 8.9  GFRNONAA 22* 18* 17*  ANIONGAP '10 13 10     '$ Hematology Recent Labs  Lab 01/02/21 1414  WBC 5.8  RBC 3.94*  HGB 10.2*  HCT 32.7*  MCV 83.0  MCH 25.9*  MCHC 31.2  RDW 15.9*  PLT 196    BNP Recent Labs  Lab 01/02/21 1414  BNP 534.0*     DDimer No results for input(s): DDIMER in the last 168 hours.   Radiology    No results found.  Cardiac Studies   2019 Echo: EF 50 to 55%  01/03/2021 Echo 1. Left ventricular ejection fraction, by estimation, is 35 to 40%. The  left ventricle has moderately decreased function. The left ventricle  demonstrates regional wall motion abnormalities (see scoring  diagram/findings for description). There is mild  left ventricular hypertrophy. Left ventricular diastolic parameters are  indeterminate.   2. Right ventricular systolic function is normal. The right ventricular  size is mildly enlarged. There is moderately  elevated pulmonary artery  systolic pressure.   3. Left atrial size was severely dilated.   4. Right atrial size was severely dilated.   5. The mitral valve is normal in structure. Trivial mitral valve  regurgitation. No evidence of mitral stenosis.   6. The aortic valve is tricuspid. Aortic valve regurgitation is trivial.  Mild aortic valve sclerosis is present, with no evidence of aortic valve  stenosis.   7. Aortic dilatation noted. There is mild dilatation of the ascending  aorta, measuring 39 mm. cho 01/03/21  Patient Profile     87 y.o. male with a hx of permanent pacemaker for complete heart block, permanent atrial fibrillation/flutter (on Eliquis), CKD, DM, HL and hypertension who is being seen 01/05/2021 for the evaluation of acute systolic and diastolic CHF at the request of Dr. Dyann Kief  Assessment & Plan    Acute systolic and diastolic CHF (NYHA Class  III) The patient is admitted with leg edema. No CP or SOB.   -Agree with holding ARB -Judicious use of IV diuretics given baseline CKD (today BUN 79, Cr 3.31) -Strict I and Os, daily weights -Maintain serum K>4.0 and Mg>2.0 -Favor aggressive medical therapy over invasive options (such as RHC+LHC+Coros) given age/CKD -Continue metoprolol and vasodilators such as Bidil (If BP allows)  2. Permanent Atrial Fibrillation CHA2DS2-VASc Score = 5 -Continue Eliquis   For questions or updates, please contact [facility] Please consult [email or web address] for contact info under        Signed, Meade Maw, MD  01/06/2021, 8:52 AM

## 2021-01-06 NOTE — Progress Notes (Signed)
PROGRESS NOTE    Kyle Schultz  [id number] DOB: May 09, 1930 DOA: 01/02/2021 PCP: Rosita Fire, MD    Chief Complaint  Patient presents with   Leg Swelling    Brief admission Narrative:  As per H&P written by Dr. Waldron Labs on 01/02/2021  Kyle Schultz  is a 87 y.o. male, with past medical history of CKD, diabetes mellitus, complete heart block s/p pacemaker, hyperlipidemia, hypertension, patient presents to the emergency department for evaluation of worsening bilateral lower extremity edema, and overall is poor historian, patient presents to worsening lower extremity edema, patient report he is compliant with his medications, he has been having worsening lower extremity edema over the last 5 days, causing some ambulation difficulties, he denies any chest pain, shortness of breath, no fever, no cough, is not very compliant with low-salt diet given he is eating a lot of food outside, as well he is noncompliant with his Lasix and is using it as needed. - in ED patient was +3 lower extremity edema, elevated BNP, chest x-ray significant with volume overload (questionable pneumonia), creatinine elevated at 2.34, which is at his baseline, patient was started on Lasix and [facility] hospitalist consulted to admit.  Assessment & Plan:  1)-Acute on chronic combined diastolic and systolic CHF--- -No further dyspnea at rest, has some dyspnea on exertion ---2D echo demonstrating mild ventricular hypertrophy, regional wall motion and normality and decrease in his ejection fraction to 35 to 40%. -Cardiology consult appreciated, recommends medical therapy over invasive options (such as RHC+LHC+Coros) given age/CKD -c/n metoprolol 12.5 mg twice daily Hold Losartan due to worsening renal function, use BiDil instead -Cardiology recommends diuretic holiday given soft BP and rising creatinine  2)HTN---  Soft BP noted given need for CHF medications -Continue metoprolol 12.5 mg twice daily and Bidil  3)Permanent  Afib/Cardiac pacemaker in situ  CHA2DS2- VASc score is = 5, Which is  equal to =  % annual risk of stroke  This patients CHA2DS2-VASc Score and unadjusted Ischemic Stroke Rate (% per year) is equal to 7.2 % stroke rate/year from a score of 5 - C/n Eliquis  4- AKI  on CKD stage IV --Cardiology recommends diuretic holiday given soft BP and rising creatinine -renally adjust medications, avoid nephrotoxic agents / dehydration  / hypotension   5)DM2-A1c 6.2 reflecting excellent diabetic control PTA  --Hold Linagliptin, Glipizide,  -Given progression of his renal function he is Not a candidate for [place]. Use Novolog/Humalog Sliding scale insulin with Accu-Cheks/Fingersticks as ordered   6)Physical deconditioning  -Physical therapy has seen patient and recommending a skilled nursing facility for rehab and conditioning at discharge. -Patient and family in agreement.  Disposition-cardiologist advises against discharge today given rising creatinine and need to adjust diuretic regimen further  DVT prophylaxis: Chronically on Eliquis. Code Status: Full code Family Communication: No family at bedside during today's evaluation. Disposition:   Status is: Inpatient  Remains inpatient appropriate because: Please see disposition above  Dispo: The patient is from: Home              Anticipated d/c is to: SNF              Patient currently is not medically stable to d/c.   Difficult to place patient No    Consultants:  Cardiology will be consulted.  Procedures:  2D echo: 1. Left ventricular ejection fraction, by estimation, is 35 to 40%. The  left ventricle has moderately decreased function. The left ventricle  demonstrates regional wall motion abnormalities (see scoring  diagram/findings for description). There is mild  left ventricular hypertrophy. Left ventricular diastolic parameters are  indeterminate.   2. Right ventricular systolic function is normal. The right ventricular  size  is mildly enlarged. There is moderately elevated pulmonary artery  systolic pressure.   3. Left atrial size was severely dilated.   4. Right atrial size was severely dilated.   5. The mitral valve is normal in structure. Trivial mitral valve  regurgitation. No evidence of mitral stenosis.   6. The aortic valve is tricuspid. Aortic valve regurgitation is trivial.  Mild aortic valve sclerosis is present, with no evidence of aortic valve  stenosis.   7. Aortic dilatation noted. There is mild dilatation of the ascending  aorta, measuring 39 mm.   Antimicrobials:  None  Subjective: Fatigue persist, dyspnea on exertion persist, patient states shortness of breath at rest is mostly resolving No chest pains,.  Objective: Vitals:   01/05/21 0839 01/05/21 2130 01/06/21 0500 01/06/21 0804  BP:  (!) 92/48  122/74  Pulse: 61 63    Resp:      Temp:  (!) 97.4 F (36.3 C)    TempSrc:  Oral    SpO2:  99%    Weight:   84.2 kg   Height:        Intake/Output Summary (Last 24 hours) at 01/06/2021 1309 Last data filed at 01/05/2021 2153 Gross per 24 hour  Intake 250 ml  Output --  Net 250 ml   Filed Weights   01/04/21 0500 01/05/21 0500 01/06/21 0500  Weight: 84.7 kg 83.9 kg 84.2 kg   Physical Exam Gen:- Awake Alert,  in no apparent distress  HEENT:- [place].AT, No sclera icterus Neck-Supple Neck,No JVD,.  Lungs-diminished breath sounds, no wheezing CV- S1, S2 normal, irregular Abd-  +ve B.Sounds, Abd Soft, No tenderness,    Extremity/Skin:-+1 pitting edema,    Psych-affect is appropriate, oriented x3 Neuro-generalized weakness , no new focal deficits, no tremors   Data Reviewed: I have personally reviewed following labs and imaging studies  CBC: Recent Labs  Lab 01/02/21 1414  WBC 5.8  NEUTROABS 4.3  HGB 10.2*  HCT 32.7*  MCV 83.0  PLT 123456    Basic Metabolic Panel: Recent Labs  Lab 01/02/21 1414 01/03/21 0554 01/04/21 0615 01/05/21 0607 01/06/21 0536  NA 137 141 139  139 139  K 3.4* 3.3* 3.2* 3.6 3.7  CL 106 105 101 102 104  CO2 '24 26 28 24 25  '$ GLUCOSE 130* 134* 173* 177* 158*  BUN 35* 36* 43* 60* 79*  CREATININE 2.34* 2.15* 2.64* 3.11* 3.31*  CALCIUM 9.0 9.8 9.1 9.0 8.9    GFR: Estimated Creatinine Clearance: 16.6 mL/min (A) (by C-G formula based on SCr of 3.31 mg/dL (H)).   CBG: Recent Labs  Lab 01/05/21 1121 01/05/21 1717 01/05/21 2132 01/06/21 0726 01/06/21 1132  GLUCAP 174* 154* 149* 143* 143*     Recent Results (from the past 240 hour(s))  Resp Panel by RT-PCR (Flu A&B, Covid) Nasopharyngeal Swab     Status: None   Collection Time: 01/02/21  4:15 PM   Specimen: Nasopharyngeal Swab; Nasopharyngeal(NP) swabs in vial transport medium  Result Value Ref Range Status   SARS Coronavirus 2 by RT PCR NEGATIVE NEGATIVE Final    Comment: (NOTE) SARS-CoV-2 target nucleic acids are NOT DETECTED.  The SARS-CoV-2 RNA is generally detectable in upper respiratory specimens during the acute phase of infection. The lowest concentration of SARS-CoV-2 viral copies this assay can detect is  138 copies/mL. A negative result does not preclude SARS-Cov-2 infection and should not be used as the sole basis for treatment or other patient management decisions. A negative result may occur with  improper specimen collection/handling, submission of specimen other than nasopharyngeal swab, presence of viral mutation(s) within the areas targeted by this assay, and inadequate number of viral copies(<138 copies/mL). A negative result must be combined with clinical observations, patient history, and epidemiological information. The expected result is Negative.  Fact Sheet for Patients:  EntrepreneurPulse.com.au  Fact Sheet for Healthcare Providers:  IncredibleEmployment.be  This test is no t yet approved or cleared by the [place] FDA and  has been authorized for detection and/or diagnosis of SARS-CoV-2 by FDA under  an Emergency Use Authorization (EUA). This EUA will remain  in effect (meaning this test can be used) for the duration of the COVID-19 declaration under Section 564(b)(1) of the Act, 21 U.S.C.section 360bbb-3(b)(1), unless the authorization is terminated  or revoked sooner.       Influenza A by PCR NEGATIVE NEGATIVE Final   Influenza B by PCR NEGATIVE NEGATIVE Final    Comment: (NOTE) The Xpert Xpress SARS-CoV-2/FLU/RSV plus assay is intended as an aid in the diagnosis of influenza from Nasopharyngeal swab specimens and should not be used as a sole basis for treatment. Nasal washings and aspirates are unacceptable for Xpert Xpress SARS-CoV-2/FLU/RSV testing.  Fact Sheet for Patients: EntrepreneurPulse.com.au  Fact Sheet for Healthcare Providers: IncredibleEmployment.be  This test is not yet approved or cleared by the [place] FDA and has been authorized for detection and/or diagnosis of SARS-CoV-2 by FDA under an Emergency Use Authorization (EUA). This EUA will remain in effect (meaning this test can be used) for the duration of the COVID-19 declaration under Section 564(b)(1) of the Act, 21 U.S.C. section 360bbb-3(b)(1), unless the authorization is terminated or revoked.  Performed at [facility], [address], [place], [place] [address]      Radiology Studies: No results found.  Scheduled Meds:  apixaban  2.5 mg Oral BID   insulin aspart  0-15 Units Subcutaneous TID WC   insulin aspart  0-5 Units Subcutaneous QHS   isosorbide-hydrALAZINE  1 tablet Oral BID   metoprolol tartrate  12.5 mg Oral BID   simvastatin  20 mg Oral QHS   sodium chloride flush  3 mL Intravenous Q12H   Continuous Infusions:  sodium chloride       LOS: 4 days   Roxan Hockey, MD [facility]   To contact the attending provider between 7A-7P or the covering provider during after hours 7P-7A, please log into the web site [email or web address] and  access using universal [place] password for that web site. If you do not have the password, please call the hospital operator.  01/06/2021, 1:09 PM

## 2021-01-06 NOTE — Progress Notes (Signed)
Patient pleasant. Condom cath assessed, no problems, draining well. Patient reported to his sister who was visiting that she did not want anyone at his house while he was in hospital. She stated she was going to notify the police when she left, but shortly after a gentleman called on the phone asking what was going on. Per aide, patient was stating that his grandson was causing problems.

## 2021-01-06 NOTE — Progress Notes (Signed)
Patient's son called for an update, provided. Notified patient per request that he had called.

## 2021-01-06 NOTE — Progress Notes (Signed)
Physical Therapy Treatment Patient Details Name: Kyle Schultz MRN: [id number] DOB: Aug 01, 1929 Today's Date: 01/06/2021    History of Present Illness Kyle Schultz  is a 87 y.o. male, with past medical history of CKD, diabetes mellitus, complete heart block s/p pacemaker, hyperlipidemia, hypertension, patient presents to the emergency department for evaluation of worsening bilateral lower extremity edema, and overall is poor historian, patient presents to worsening lower extremity edema, patient report he is compliant with his medications, he has been having worsening lower extremity edema over the last 5 days, causing some ambulation difficulties, he denies any chest pain, shortness of breath, no fever, no cough, is not very compliant with low-salt diet given he is eating a lot of food outside, as well he is noncompliant with his Lasix and is using it as needed.  - in ED patient was +3 lower extremity edema, elevated BNP, chest x-ray significant with volume overload (questionable pneumonia), creatinine elevated at 2.34, which is at his baseline, patient was started on Lasix and [facility] hospitalist consulted to admit.    PT Comments    Patient requires assist to upright trunk for for LE movement to transition to seated EOB. Patient demonstrates good sitting tolerance and sitting balance EOB. Patient performs seated marching with UE assist for hip flexor weakness. Patient lacking knee extension ROM bilaterally and is able to perform LAQ in limited range. With max assist, RW, and cueing for sequencing, patient is unable to fully transfer to standing secondary to weakness and c/o bilateral knee pain. Attempted transfer to standing several times. Patient returned to supine at end of session. Patient will benefit from continued physical therapy in hospital and recommended venue below to increase strength, balance, endurance for safe ADLs and gait.    Follow Up Recommendations  SNF     Equipment  Recommendations  Other (comment) (TBD based on functional needs/outcomes)    Recommendations for Other Services       Precautions / Restrictions Precautions Precautions: Fall Restrictions Weight Bearing Restrictions: No    Mobility  Bed Mobility Overal bed mobility: Needs Assistance Bed Mobility: Supine to Sit;Sit to Supine     Supine to sit: Mod assist;HOB elevated Sit to supine: Mod assist   General bed mobility comments: assist for LE movement and uprighting trunk with HOB elevated    Transfers Overall transfer level: Needs assistance Equipment used: Rolling walker (2 wheeled) Transfers: Sit to/from Stand Sit to Stand: Max assist         General transfer comment: unable to transfer to fully standing due to LE weakness and c/o bilateral knee pain despite cueing for proper RW use  Ambulation/Gait                 Stairs             Wheelchair Mobility    Modified Rankin (Stroke Patients Only)       Balance Overall balance assessment: Needs assistance Sitting-balance support: No upper extremity supported Sitting balance-Leahy Scale: Fair Sitting balance - Comments: seated EOB   Standing balance support: Bilateral upper extremity supported Standing balance-Leahy Scale: Zero Standing balance comment: with RW                            Cognition Arousal/Alertness: Awake/alert Behavior During Therapy: WFL for tasks assessed/performed Overall Cognitive Status: Within Functional Limits for tasks assessed  Exercises General Exercises - Lower Extremity Ankle Circles/Pumps: AROM;Both;20 reps;Seated Long Arc Quad: AROM;Both;20 reps;Seated Hip Flexion/Marching: AAROM;Both;10 reps;Seated    General Comments        Pertinent Vitals/Pain Faces Pain Scale: Hurts even more Pain Location: bilateral knees Pain Descriptors / Indicators: Aching Pain Intervention(s): Limited activity  within patient's tolerance;Monitored during session    Home Living                      Prior Function            PT Goals (current goals can now be found in the care plan section) Acute Rehab PT Goals Patient Stated Goal: "Go to rehab" PT Goal Formulation: With patient Time For Goal Achievement: 01/12/21 Potential to Achieve Goals: Good Progress towards PT goals: Progressing toward goals    Frequency    Min 3X/week      PT Plan Current plan remains appropriate    Co-evaluation              AM-PAC PT "6 Clicks" Mobility   Outcome Measure  Help needed turning from your back to your side while in a flat bed without using bedrails?: A Little Help needed moving from lying on your back to sitting on the side of a flat bed without using bedrails?: A Lot Help needed moving to and from a bed to a chair (including a wheelchair)?: A Lot Help needed standing up from a chair using your arms (e.g., wheelchair or bedside chair)?: A Lot Help needed to walk in hospital room?: Total Help needed climbing 3-5 steps with a railing? : Total 6 Click Score: 11    End of Session Equipment Utilized During Treatment: Gait belt Activity Tolerance: Patient limited by fatigue;Patient limited by pain Patient left: in bed;with call bell/phone within reach;with bed alarm set Nurse Communication: Mobility status PT Visit Diagnosis: Unsteadiness on feet (R26.81);Muscle weakness (generalized) (M62.81);Difficulty in walking, not elsewhere classified (R26.2)     Time: [id number] PT Time Calculation (min) (ACUTE ONLY): 20 min  Charges:  $Therapeutic Exercise: 8-22 mins                     3:19 PM, 01/06/21 Mearl Latin PT, DPT Physical Therapist at [facility]

## 2021-01-07 DIAGNOSIS — N184 Chronic kidney disease, stage 4 (severe): Secondary | ICD-10-CM | POA: Diagnosis present

## 2021-01-07 DIAGNOSIS — I5043 Acute on chronic combined systolic (congestive) and diastolic (congestive) heart failure: Secondary | ICD-10-CM | POA: Diagnosis present

## 2021-01-07 LAB — RENAL FUNCTION PANEL
Albumin: 2.9 g/dL — ABNORMAL LOW (ref 3.5–5.0)
Anion gap: 11 (ref 5–15)
BUN: 88 mg/dL — ABNORMAL HIGH (ref 8–23)
CO2: 24 mmol/L (ref 22–32)
Calcium: 8.9 mg/dL (ref 8.9–10.3)
Chloride: 104 mmol/L (ref 98–111)
Creatinine, Ser: 2.96 mg/dL — ABNORMAL HIGH (ref 0.61–1.24)
GFR, Estimated: 20 mL/min — ABNORMAL LOW (ref >60)
Glucose, Bld: 166 mg/dL — ABNORMAL HIGH (ref 70–99)
Phosphorus: 3.3 mg/dL (ref 2.5–4.6)
Potassium: 3.9 mmol/L (ref 3.5–5.1)
Sodium: 139 mmol/L (ref 135–145)

## 2021-01-07 LAB — CBC
HCT: 30.8 % — ABNORMAL LOW (ref 39.0–52.0)
Hemoglobin: 9.6 g/dL — ABNORMAL LOW (ref 13.0–17.0)
MCH: 25.6 pg — ABNORMAL LOW (ref 26.0–34.0)
MCHC: 31.2 g/dL (ref 30.0–36.0)
MCV: 82.1 fL (ref 80.0–100.0)
Platelets: 241 10*3/uL (ref 150–400)
RBC: 3.75 MIL/uL — ABNORMAL LOW (ref 4.22–5.81)
RDW: 15.6 % — ABNORMAL HIGH (ref 11.5–15.5)
WBC: 6.8 10*3/uL (ref 4.0–10.5)
nRBC: 0 % (ref 0.0–0.2)

## 2021-01-07 LAB — GLUCOSE, CAPILLARY
Glucose-Capillary: 169 mg/dL — ABNORMAL HIGH (ref 70–99)
Glucose-Capillary: 148 mg/dL — ABNORMAL HIGH (ref 70–99)
Glucose-Capillary: 181 mg/dL — ABNORMAL HIGH (ref 70–99)

## 2021-01-07 MED ORDER — METOPROLOL SUCCINATE ER 25 MG PO TB24: 25.0000 mg | ORAL_TABLET | Freq: Every day | ORAL | 3 refills | Status: DC

## 2021-01-07 MED ORDER — OLANZAPINE 2.5 MG PO TABS: 2.5000 mg | ORAL_TABLET | Freq: Every evening | ORAL | 2 refills | Status: DC | PRN

## 2021-01-07 MED ORDER — ISOSORB DINITRATE-HYDRALAZINE 20-37.5 MG PO TABS: 1.0000 | ORAL_TABLET | Freq: Two times a day (BID) | ORAL | 2 refills | Status: DC

## 2021-01-07 MED ORDER — METOPROLOL SUCCINATE ER 25 MG PO TB24
25.0000 mg | ORAL_TABLET | Freq: Every day | ORAL | Status: DC
  Administered 2021-01-07: 25 mg via ORAL
  Filled 2021-01-07: qty 1

## 2021-01-07 MED ORDER — GLIMEPIRIDE 2 MG PO TABS: 2.0000 mg | ORAL_TABLET | ORAL | 11 refills | Status: DC

## 2021-01-07 MED ORDER — LORAZEPAM 2 MG/ML IJ SOLN
1.0000 mg | Freq: Once | INTRAMUSCULAR | Status: AC
  Administered 2021-01-07: 1 mg via INTRAVENOUS
  Filled 2021-01-07: qty 1

## 2021-01-07 MED ORDER — FUROSEMIDE 40 MG PO TABS
40.0000 mg | ORAL_TABLET | Freq: Every day | ORAL | Status: DC
  Administered 2021-01-07: 40 mg via ORAL
  Filled 2021-01-07: qty 1

## 2021-01-07 MED ORDER — ACETAMINOPHEN 325 MG PO TABS: 650.0000 mg | ORAL_TABLET | ORAL | 2 refills | Status: DC | PRN

## 2021-01-07 MED ORDER — FUROSEMIDE 40 MG PO TABS: 40.0000 mg | ORAL_TABLET | Freq: Every day | ORAL | 2 refills | Status: DC

## 2021-01-07 MED ORDER — MUSCLE RUB 10-15 % EX CREA: 1.0000 "application " | TOPICAL_CREAM | Freq: Two times a day (BID) | CUTANEOUS | 0 refills | Status: DC | PRN

## 2021-01-07 NOTE — Plan of Care (Signed)
°  Problem: Coping: °Goal: Level of anxiety will decrease °Outcome: Progressing °  °

## 2021-01-07 NOTE — Discharge Instructions (Signed)
1)You are taking Apixaban/Eliquis so Avoid ibuprofen/Advil/Aleve/Motrin/Goody Powders/Naproxen/BC powders/Meloxicam/Diclofenac/Indomethacin and other Nonsteroidal anti-inflammatory medications as these will make you more likely to bleed and can cause stomach ulcers, can also cause Kidney problems.   2)You will benefit from possible Knee injections from orthopedic surgeon to help your knee pain--   3)Repeat CBC and BMP Blood test on Tuesday 01/13/21

## 2021-01-07 NOTE — Progress Notes (Signed)
Progress Note  Patient Name: Kyle Schultz Date of Encounter: 01/07/2021  Primary Cardiologist: None  Subjective   Resting this morning.  Protective mitts in place.  Inpatient Medications    Scheduled Meds:  apixaban  2.5 mg Oral BID   furosemide  40 mg Oral Daily   insulin aspart  0-15 Units Subcutaneous TID WC   insulin aspart  0-5 Units Subcutaneous QHS   isosorbide-hydrALAZINE  1 tablet Oral BID   metoprolol tartrate  12.5 mg Oral BID   simvastatin  20 mg Oral QHS   sodium chloride flush  3 mL Intravenous Q12H   Continuous Infusions:  sodium chloride     PRN Meds: sodium chloride, acetaminophen, Muscle Rub, ondansetron (ZOFRAN) IV, sodium chloride flush   Vital Signs    Vitals:   01/06/21 0804 01/06/21 1341 01/06/21 2218 01/07/21 0629  BP: 122/74 (!) 110/48 (!) 104/44 125/65  Pulse:  (!) 59 66 90  Resp:  '20 18 19  '$ Temp:  98.4 F (36.9 C) 98 F (36.7 C) 98.5 F (36.9 C)  TempSrc:  Oral Oral   SpO2:  93% 99% 94%  Weight:      Height:        Intake/Output Summary (Last 24 hours) at 01/07/2021 0826 Last data filed at 01/06/2021 2346 Gross per 24 hour  Intake 800 ml  Output 1000 ml  Net -200 ml   Filed Weights   01/04/21 0500 01/05/21 0500 01/06/21 0500  Weight: 84.7 kg 83.9 kg 84.2 kg    Telemetry    Ventricular pacing with underlying atrial flutter.  Personally reviewed.  ECG    No ECG reviewed.  Physical Exam   GEN: Elderly male.  No acute distress.   Neck: No JVD. Cardiac: RRR, 1/6 systolic murmur. Respiratory: Nonlabored. Clear to auscultation bilaterally. GI: Soft, nontender, bowel sounds present. MS: No edema; No deformity.  Labs    Chemistry Recent Labs  Lab 01/05/21 0607 01/06/21 0536 01/07/21 0515  NA 139 139 139  K 3.6 3.7 3.9  CL 102 104 104  CO2 '24 25 24  '$ GLUCOSE 177* 158* 166*  BUN 60* 79* 88*  CREATININE 3.11* 3.31* 2.96*  CALCIUM 9.0 8.9 8.9  ALBUMIN  --   --  2.9*  GFRNONAA 18* 17* 20*  ANIONGAP '13 10 11      '$ Hematology Recent Labs  Lab 01/02/21 1414 01/07/21 0515  WBC 5.8 6.8  RBC 3.94* 3.75*  HGB 10.2* 9.6*  HCT 32.7* 30.8*  MCV 83.0 82.1  MCH 25.9* 25.6*  MCHC 31.2 31.2  RDW 15.9* 15.6*  PLT 196 241    Cardiac Enzymes Recent Labs  Lab 01/02/21 1419 01/02/21 1619  TROPONINIHS 118* 133*    BNP Recent Labs  Lab 01/02/21 1414  BNP 534.0*     Radiology    No results found.  Cardiac Studies   Echocardiogram 01/03/2021:  1. Left ventricular ejection fraction, by estimation, is 35 to 40%. The  left ventricle has moderately decreased function. The left ventricle  demonstrates regional wall motion abnormalities (see scoring  diagram/findings for description). There is mild  left ventricular hypertrophy. Left ventricular diastolic parameters are  indeterminate.   2. Right ventricular systolic function is normal. The right ventricular  size is mildly enlarged. There is moderately elevated pulmonary artery  systolic pressure.   3. Left atrial size was severely dilated.   4. Right atrial size was severely dilated.   5. The mitral valve is normal in structure.  Trivial mitral valve  regurgitation. No evidence of mitral stenosis.   6. The aortic valve is tricuspid. Aortic valve regurgitation is trivial.  Mild aortic valve sclerosis is present, with no evidence of aortic valve  stenosis.   7. Aortic dilatation noted. There is mild dilatation of the ascending  aorta, measuring 39 mm.   Assessment & Plan    1.  Acute on chronic systolic heart failure with LVEF 35 to 40% by recent echocardiogram, RV contraction normal.  Volume status improved with diuresis.  Lasix now oral dosing.  2.  Secondary cardiomyopathy, possibly underlying ischemic etiology versus secondary to RV pacing.  Plan conservative management at this time without aggressive invasive work-up.  Currently on BiDil and Lopressor, ARB on hold in light of acute on chronic renal insufficiency.  3.  CKD stage  IIIb-IV with acute renal insufficiency in the setting of IV diuresis.  4.  Complete heart block status post pacemaker, follows with Dr. Lovena Le.  5.  Permanent atrial fibrillation/flutter, CHA2DS2-VASc score is 5.  He is on Eliquis for stroke prophylaxis.  We will switch from Lopressor to Toprol-XL 25 mg daily.  Continue BiDil.  Hold ARB as before.  Not a good candidate for Aldactone or SGLT2 inhibitor.  Continue present dose of Lasix, creatinine has come down from 3.31 to 2.96.  Signed, Rozann Lesches, MD  01/07/2021, 8:26 AM

## 2021-01-07 NOTE — Discharge Summary (Signed)
Kyle Schultz, is a 87 y.o. male  DOB 12-24-1929  MRN [id number].  Admission date:  01/02/2021  Admitting Physician  Albertine Patricia, MD  Discharge Date:  01/07/2021   Primary MD  Rosita Fire, MD  Recommendations for primary care physician for things to follow:   1)You are taking Apixaban/Eliquis so Avoid ibuprofen/Advil/Aleve/Motrin/Goody Powders/Naproxen/BC powders/Meloxicam/Diclofenac/Indomethacin and other Nonsteroidal anti-inflammatory medications as these will make you more likely to bleed and can cause stomach ulcers, can also cause Kidney problems.   2)You will benefit from possible Knee injections from orthopedic surgeon to help your knee pain--   3)Repeat CBC and BMP Blood test on Tuesday 01/13/21  Admission Diagnosis  Pneumonia [J18.9] Acute on chronic diastolic CHF (congestive heart failure) (HCC) [I50.33] Acute on chronic congestive heart failure, unspecified heart failure type (HCC) [I50.9]  Discharge Diagnosis  Pneumonia [J18.9] Acute on chronic diastolic CHF (congestive heart failure) (HCC) [I50.33] Acute on chronic congestive heart failure, unspecified heart failure type ([place]) [I50.9]    Principal Problem:   Acute on chronic combined systolic and diastolic CHF (congestive heart failure) /EF 35 to 40 % Active Problems:   Acute on chronic diastolic CHF (congestive heart failure) (HCC)   Acute on chronic congestive heart failure (HCC)   Chronic renal insufficiency, stage III (moderate) ([place])   Uncontrolled type 2 diabetes mellitus with stage 3 chronic kidney disease (HCC)   Essential hypertension, benign   Cardiac pacemaker in situ   Type 2 diabetes mellitus with other circulatory complications ([place])      Past Medical History:  Diagnosis Date   Atrial fibrillation and flutter (HCC)    Cardiomyopathy ([place])    CKD (chronic kidney disease) stage 3, GFR 30-59 ml/min (HCC)     Complete heart block ([place])    Diabetes mellitus    Hyperlipidemia    Hypertension    Pacemaker    Sinus bradycardia     Past Surgical History:  Procedure Laterality Date   COLONOSCOPY  2010   Two 4 mm sessile hepatic flexure polyps, one slightly pedunculated 6 to 8 mm sessile descending colon polyp, small internal hemorrhoids. Simple adenomas.    colonscopy     HEMORRHOID SURGERY     PACEMAKER INSERTION  [address] jude   PPM GENERATOR CHANGEOUT N/A 09/27/2019   Procedure: PPM GENERATOR CHANGEOUT;  Surgeon: Evans Lance, MD;  Location: [facility];  Service: Cardiovascular;  Laterality: N/A;   UMBILICAL HERNIA REPAIR  04/2008     HPI  from the history and physical done on the day of admission:    Kyle Schultz  is a 87 y.o. male, with past medical history of CKD, diabetes mellitus, complete heart block s/p pacemaker, hyperlipidemia, hypertension, patient presents to the emergency department for evaluation of worsening bilateral lower extremity edema, and overall is poor historian, patient presents to worsening lower extremity edema, patient report he is compliant with his medications, he has been having worsening lower extremity edema over the last 5 days, causing some  ambulation difficulties, he denies any chest pain, shortness of breath, no fever, no cough, is not very compliant with low-salt diet given he is eating a lot of food outside, as well he is noncompliant with his Lasix and is using it as needed. - in ED patient was +3 lower extremity edema, elevated BNP, chest x-ray significant with volume overload (questionable pneumonia), creatinine elevated at 2.34, which is at his baseline, patient was started on Lasix and [facility] hospitalist consulted to admit.    Hospital Course:   Problem  Acute on chronic combined systolic and diastolic CHF (congestive heart failure) /EF 35 to 40 %  Ckd (Chronic Kidney Disease), Stage IV (Hcc)  Acute On Chronic Congestive Heart Failure (Hcc)   Acute On Chronic Diastolic Chf (Congestive Heart Failure) (Hcc)  Uncontrolled Type 2 Diabetes Mellitus With Stage 3 Chronic Kidney Disease (Hcc)  Type 2 Diabetes Mellitus With Other Circulatory Complications (Hcc)  Chronic Renal Insufficiency, Stage III (Moderate) (Hcc)     Assessment & Plan:   1)-Acute on chronic combined diastolic and systolic CHF--- -No further dyspnea at rest, has some dyspnea on exertion ---2D echo demonstrating mild ventricular hypertrophy, regional wall motion and normality and decrease in his ejection fraction to 35 to 40%. -Cardiology consult appreciated, recommends medical therapy over invasive options (such as RHC+LHC+Coros) given age/CKD Hold Losartan due to worsening renal function, use BiDil instead Toprol-XL as ordered, Lasix 40 mg daily,   2)HTN---  Soft BP noted given need for CHF medications -Continue Toprol-XL and BiDil   3)Permanent Afib/Cardiac pacemaker in situ  CHA2DS2- VASc score is = 5, Which is  equal to =  % annual risk of stroke This patients CHA2DS2-VASc Score and unadjusted Ischemic Stroke Rate (% per year) is equal to 7.2 % stroke rate/year from a score of 5 - C/n Eliquis   4- AKI  on CKD stage IV -Creatinine is down to 2.96 from 3.21 -Repeat BMP on 01/13/2021   5)DM2-A1c 6.2 reflecting excellent diabetic control PTA  Resume linagliptin, and Amaryl -Given progression of his renal function he is Not a candidate for [place].   6)Physical deconditioning -Physical therapy has seen patient and recommending a skilled nursing facility for rehab and conditioning at discharge. -Patient and family in agreement.  7) dementia with hospital delirium----patient with some sundowning, Zyprexa as needed nightly as advised   Disposition-discharge to SNF   DVT prophylaxis: Chronically on Eliquis. Code Status: Full code Family Communication: Discussed with son at [phone] Disposition: SNF  Dispo: The patient is from: Home               Anticipated d/c is to: SNF  Discharge Condition: stable  Follow UP   Contact information for follow-up providers     Isaiah Serge, NP Follow up on 02/05/2021.   Specialties: Cardiology, Radiology Why: Cardiology Hospital Follow-up on 02/05/2021 at 1:30 PM. Contact information: [place] [address] [phone]              Contact information for after-discharge care     [facility] .   Service: Skilled Nursing Contact information: [address] [place] [phone]                      Consults obtained - cardiology  Diet and Activity recommendation:  As advised  Discharge Instructions   Discharge Instructions     Call MD for:  difficulty  breathing, headache or visual disturbances   Complete by: As directed    Call MD for:  persistant dizziness or light-headedness   Complete by: As directed    Call MD for:  persistant nausea and vomiting   Complete by: As directed    Call MD for:  temperature >100.4   Complete by: As directed    Diet - low sodium heart healthy   Complete by: As directed    Diet Carb Modified   Complete by: As directed    Discharge instructions   Complete by: As directed    1)You are taking Apixaban/Eliquis so Avoid ibuprofen/Advil/Aleve/Motrin/Goody Powders/Naproxen/BC powders/Meloxicam/Diclofenac/Indomethacin and other Nonsteroidal anti-inflammatory medications as these will make you more likely to bleed and can cause stomach ulcers, can also cause Kidney problems.   2)You will benefit from possible Knee injections from orthopedic surgeon to help your knee pain--   3)Repeat CBC and BMP Blood test on Tuesday 01/13/21   Increase activity slowly   Complete by: As directed          Discharge Medications     Allergies as of 01/07/2021       Reactions   Other    "some kind of fluid pill I can't take"         Medication List      STOP taking these medications    glipiZIDE 5 MG tablet Commonly known as: GLUCOTROL   losartan 50 MG tablet Commonly known as: COZAAR       TAKE these medications    acetaminophen 325 MG tablet Commonly known as: TYLENOL Take 2 tablets (650 mg total) by mouth every 4 (four) hours as needed for headache or mild pain.   apixaban 2.5 MG Tabs tablet Commonly known as: ELIQUIS Take 1 tablet (2.5 mg total) by mouth 2 (two) times daily.   carbamide peroxide 6.5 % OTIC solution Commonly known as: DEBROX Place 5 drops into both ears 2 (two) times daily.   docusate sodium 100 MG capsule Commonly known as: COLACE Take 100 mg by mouth daily as needed for mild constipation.   furosemide 40 MG tablet Commonly known as: LASIX Take 1 tablet (40 mg total) by mouth daily. Start taking on: January 08, 2021 What changed: when to take this   glimepiride 2 MG tablet Commonly known as: Amaryl Take 1 tablet (2 mg total) by mouth every morning.   glucose blood test strip 1 each by Other route 2 (two) times daily. Use as instructed 2 x daily. E11.65 EZ MAX   isosorbide-hydrALAZINE 20-37.5 MG tablet Commonly known as: BIDIL Take 1 tablet by mouth 2 (two) times daily.   linagliptin 5 MG Tabs tablet Commonly known as: TRADJENTA Take 5 mg by mouth daily.   metoprolol succinate 25 MG 24 hr tablet Commonly known as: TOPROL-XL Take 1 tablet (25 mg total) by mouth daily. Start taking on: January 08, 2021   Muscle Rub 10-15 % Crea Apply 1 application topically 2 (two) times daily as needed for muscle pain. Both Knees   OLANZapine 2.5 MG tablet Commonly known as: ZyPREXA Take 1 tablet (2.5 mg total) by mouth at bedtime as needed (Dementia /insomnia/restlessness/agitation).   simvastatin 20 MG tablet Commonly known as: ZOCOR Take 20 mg by mouth at bedtime. What changed: Another medication with the same name was removed. Continue taking this medication, and follow the directions you see  here.        Major procedures and Radiology Reports - PLEASE review detailed and  final reports for all details, in brief -  DG Chest 2 View  Result Date: 01/02/2021 CLINICAL DATA:  Shortness of breath. Weakness. Bilateral leg swelling. EXAM: CHEST - 2 VIEW COMPARISON:  09/24/2017 FINDINGS: Decreased inspiration. No gross change in enlargement of the cardiac silhouette. The aorta remains mildly tortuous and calcified. Stable left subclavian bipolar pacemaker leads. Stable mildly prominent interstitial markings and stable calcified granuloma at the right lung base. Interval minimal patchy linear density in the right mid lung zone laterally. No acute bony abnormality. IMPRESSION: 1. Interval minimal linear atelectasis, pneumonia or scarring in the right mid lung zone laterally. Follow-up chest radiographs are recommended in 3-4 weeks to evaluate for resolution. 2. Stable cardiomegaly and mild chronic interstitial lung disease. Electronically Signed   By: Claudie Revering M.D.   On: 01/02/2021 14:39   CT Head Wo Contrast  Result Date: 01/02/2021 CLINICAL DATA:  Delirium, increased drowsiness, BILATERAL leg swelling, diabetes mellitus, hypertension EXAM: CT HEAD WITHOUT CONTRAST TECHNIQUE: Contiguous axial images were obtained from the base of the skull through the vertex without intravenous contrast. Sagittal and coronal MPR images reconstructed from axial data set. COMPARISON:  11/24/2019 FINDINGS: Brain: Generalized atrophy. Normal ventricular morphology. No midline shift or mass effect. Mild small vessel chronic ischemic changes of deep cerebral white matter. No intracranial hemorrhage, mass lesion, or evidence of acute infarction. No extra-axial fluid collections. Vascular: No hyperdense vessels. Atherosclerotic calcification of internal carotid arteries bilaterally at skull base Skull: Calvaria intact Sinuses/Orbits: Scattered mucosal thickening in ethmoid air cells. Mucosal retention cyst RIGHT maxillary  sinus. Remaining paranasal sinuses and mastoid air cells clear Other: N/A IMPRESSION: Atrophy with mild small vessel chronic ischemic changes of deep cerebral white matter. No acute intracranial abnormalities. Electronically Signed   By: Lavonia Dana M.D.   On: 01/02/2021 17:05   DG Chest Port 1 View  Result Date: 01/03/2021 CLINICAL DATA:  Pneumonia EXAM: PORTABLE CHEST 1 VIEW COMPARISON:  01/02/2021 FINDINGS: There is progressive asymmetric interstitial infiltrate within the left lung base, possibly infectious in the appropriate clinical setting. Benign calcified granuloma within the right lung base again noted. No pneumothorax or pleural effusion. Cardiac size within normal limits. Left subclavian dual lead pacemaker unchanged. There is central pulmonary vascular congestion again noted without superimposed overt pulmonary edema. No acute bone abnormality. IMPRESSION: Progressive left basilar pulmonary infiltrate, possibly infectious in the appropriate clinical setting. Persistent central pulmonary vascular congestion without overt pulmonary edema. Electronically Signed   By: Fidela Salisbury MD   On: 01/03/2021 06:02   ECHOCARDIOGRAM COMPLETE  Result Date: 01/03/2021    ECHOCARDIOGRAM REPORT   Patient Name:   Kyle Schultz Date of Exam: 01/03/2021 Medical Rec #:  [id number]     Height:       72.0 in Accession #:    [id number]    Weight:       194.2 lb Date of Birth:  Feb 16, 1930      BSA:          2.104 m Patient Age:    82 years      BP:           162/74 mmHg Patient Gender: M             HR:           60 bpm. Exam Location:  [facility] Procedure: 2D Echo, Cardiac Doppler and Color Doppler Indications:    CHF-Acute Diastolic [id number]  History:        Patient has  prior history of Echocardiogram examinations, most                 recent 08/03/2017. CHF, Pacemaker; Risk Factors:Hypertension,                 Diabetes and Dyslipidemia.  Sonographer:    Wenda Low Referring Phys: [place]  1. Left ventricular ejection fraction, by estimation, is 35 to 40%. The left ventricle has moderately decreased function. The left ventricle demonstrates regional wall motion abnormalities (see scoring diagram/findings for description). There is mild left ventricular hypertrophy. Left ventricular diastolic parameters are indeterminate.  2. Right ventricular systolic function is normal. The right ventricular size is mildly enlarged. There is moderately elevated pulmonary artery systolic pressure.  3. Left atrial size was severely dilated.  4. Right atrial size was severely dilated.  5. The mitral valve is normal in structure. Trivial mitral valve regurgitation. No evidence of mitral stenosis.  6. The aortic valve is tricuspid. Aortic valve regurgitation is trivial. Mild aortic valve sclerosis is present, with no evidence of aortic valve stenosis.  7. Aortic dilatation noted. There is mild dilatation of the ascending aorta, measuring 39 mm. FINDINGS  Left Ventricle: Left ventricular ejection fraction, by estimation, is 35 to 40%. The left ventricle has moderately decreased function. The left ventricle demonstrates regional wall motion abnormalities. The left ventricular internal cavity size was normal in size. There is mild left ventricular hypertrophy. Abnormal (paradoxical) septal motion, consistent with RV pacemaker. Left ventricular diastolic parameters are indeterminate.  LV Wall Scoring: The entire anterior septum, mid inferoseptal segment, apical anterior segment, apical inferior segment, and apex are akinetic. The anterior wall, entire lateral wall, inferior wall, and basal inferoseptal segment are normal. Right Ventricle: The right ventricular size is mildly enlarged. No increase in right ventricular wall thickness. Right ventricular systolic function is normal. There is moderately elevated pulmonary artery systolic pressure. The tricuspid regurgitant velocity is 3.45 m/s, and with an assumed  right atrial pressure of 8 mmHg, the estimated right ventricular systolic pressure is 123456 mmHg. Left Atrium: Left atrial size was severely dilated. Right Atrium: Right atrial size was severely dilated. Pericardium: Trivial pericardial effusion is present. Mitral Valve: The mitral valve is normal in structure. Trivial mitral valve regurgitation. No evidence of mitral valve stenosis. MV peak gradient, 3.8 mmHg. The mean mitral valve gradient is 1.0 mmHg. Tricuspid Valve: The tricuspid valve is normal in structure. Tricuspid valve regurgitation is trivial. Aortic Valve: The aortic valve is tricuspid. Aortic valve regurgitation is trivial. Mild aortic valve sclerosis is present, with no evidence of aortic valve stenosis. Aortic valve mean gradient measures 2.0 mmHg. Aortic valve peak gradient measures 5.9 mmHg. Pulmonic Valve: The pulmonic valve was not well visualized. Pulmonic valve regurgitation is not visualized. Aorta: The aortic root is normal in size and structure and aortic dilatation noted. There is mild dilatation of the ascending aorta, measuring 39 mm. IAS/Shunts: The interatrial septum was not well visualized.  LEFT VENTRICLE PLAX 2D LVIDd:         5.22 cm LV PW:         1.22 cm LV IVS:        1.23 cm  LV Volumes (MOD) LV vol d, MOD A2C: 117.0 ml LV vol d, MOD A4C: 98.6 ml LV vol s, MOD A2C: 69.2 ml LV vol s, MOD A4C: 63.4 ml LV SV MOD A2C:     47.8 ml LV SV MOD A4C:     98.6  ml LV SV MOD BP:      37.7 ml RIGHT VENTRICLE TAPSE (M-mode): 2.2 cm LEFT ATRIUM            Index       RIGHT ATRIUM           Index LA Vol (A2C): 131.0 ml 62.26 ml/m RA Area:     26.00 cm LA Vol (A4C): 79.1 ml  37.59 ml/m RA Volume:   99.10 ml  47.10 ml/m  AORTIC VALVE AV Vmax:           121.00 cm/s AV Vmean:          70.400 cm/s AV VTI:            0.203 m AV Peak Grad:      5.9 mmHg AV Mean Grad:      2.0 mmHg LVOT Vmax:         90.20 cm/s LVOT Vmean:        56.200 cm/s LVOT VTI:          0.156 m LVOT/AV VTI ratio: 0.77  AORTA  Ao Root diam: 3.60 cm Ao Asc diam:  3.90 cm MITRAL VALVE               TRICUSPID VALVE MV Area (PHT): 4.93 cm    TR Peak grad:   47.6 mmHg MV Peak grad:  3.8 mmHg    TR Vmax:        345.00 cm/s MV Mean grad:  1.0 mmHg MV Vmax:       0.97 m/s    SHUNTS MV Vmean:      41.3 cm/s   Systemic VTI: 0.16 m MV Decel Time: 154 msec MV E velocity: 91.20 cm/s MV A velocity: 53.80 cm/s MV E/A ratio:  1.70 Oswaldo Milian MD Electronically signed by Oswaldo Milian MD Signature Date/Time: 01/03/2021/2:24:03 PM    Final    CUP PACEART REMOTE DEVICE CHECK  Result Date: 12/25/2020 Scheduled remote reviewed. Normal device function.  Known permanent atrial fib, on OAC, good ventricular rate control Next remote 91 days. Kathy Breach, RN, CCDS, [employer]   Micro Results   Recent Results (from the past 240 hour(s))  Resp Panel by RT-PCR (Flu A&B, Covid) Nasopharyngeal Swab     Status: None   Collection Time: 01/02/21  4:15 PM   Specimen: Nasopharyngeal Swab; Nasopharyngeal(NP) swabs in vial transport medium  Result Value Ref Range Status   SARS Coronavirus 2 by RT PCR NEGATIVE NEGATIVE Final    Comment: (NOTE) SARS-CoV-2 target nucleic acids are NOT DETECTED.  The SARS-CoV-2 RNA is generally detectable in upper respiratory specimens during the acute phase of infection. The lowest concentration of SARS-CoV-2 viral copies this assay can detect is 138 copies/mL. A negative result does not preclude SARS-Cov-2 infection and should not be used as the sole basis for treatment or other patient management decisions. A negative result may occur with  improper specimen collection/handling, submission of specimen other than nasopharyngeal swab, presence of viral mutation(s) within the areas targeted by this assay, and inadequate number of viral copies(<138 copies/mL). A negative result must be combined with clinical observations, patient history, and epidemiological information. The expected result is  Negative.  Fact Sheet for Patients:  EntrepreneurPulse.com.au  Fact Sheet for Healthcare Providers:  IncredibleEmployment.be  This test is no t yet approved or cleared by the [place] FDA and  has been authorized for detection and/or diagnosis of SARS-CoV-2 by FDA under an  Emergency Use Authorization (EUA). This EUA will remain  in effect (meaning this test can be used) for the duration of the COVID-19 declaration under Section 564(b)(1) of the Act, 21 U.S.C.section 360bbb-3(b)(1), unless the authorization is terminated  or revoked sooner.       Influenza A by PCR NEGATIVE NEGATIVE Final   Influenza B by PCR NEGATIVE NEGATIVE Final    Comment: (NOTE) The Xpert Xpress SARS-CoV-2/FLU/RSV plus assay is intended as an aid in the diagnosis of influenza from Nasopharyngeal swab specimens and should not be used as a sole basis for treatment. Nasal washings and aspirates are unacceptable for Xpert Xpress SARS-CoV-2/FLU/RSV testing.  Fact Sheet for Patients: EntrepreneurPulse.com.au  Fact Sheet for Healthcare Providers: IncredibleEmployment.be  This test is not yet approved or cleared by the [place] FDA and has been authorized for detection and/or diagnosis of SARS-CoV-2 by FDA under an Emergency Use Authorization (EUA). This EUA will remain in effect (meaning this test can be used) for the duration of the COVID-19 declaration under Section 564(b)(1) of the Act, 21 U.S.C. section 360bbb-3(b)(1), unless the authorization is terminated or revoked.  Performed at [facility], [address], [place], [place] [address]        Today   Subjective    Kyle Schultz today has no new complaints -Was confused and restless overnight , received Ativan and was sleepy this morning, more awake today and now on oral intake is fair -Voiding well          Patient has been seen and examined prior to discharge    Objective   Blood pressure 125/65, pulse 90, temperature 98.5 F (36.9 C), resp. rate 19, height 6' (1.829 m), weight 84.2 kg, SpO2 94 %.   Intake/Output Summary (Last 24 hours) at 01/07/2021 1605 Last data filed at 01/07/2021 1500 Gross per 24 hour  Intake 240 ml  Output 300 ml  Net -60 ml    Exam Gen:- Awake Alert,  in no apparent distress  HEENT:- .AT, No sclera icterus Neck-Supple Neck,No JVD,. Lungs-improved air movement, no wheezing CV- S1, S2 normal, irregular Abd-  +ve B.Sounds, Abd Soft, No tenderness,    Extremity/Skin:-Trace pitting edema,    Psych-affect is appropriate, episodes of delirium  neuro-generalized weakness , no new focal deficits, no tremors   Data Review   CBC w Diff:  Lab Results  Component Value Date   WBC 6.8 01/07/2021   HGB 9.6 (L) 01/07/2021   HCT 30.8 (L) 01/07/2021   PLT 241 01/07/2021   LYMPHOPCT 15 01/02/2021   MONOPCT 9 01/02/2021   EOSPCT 0 01/02/2021   BASOPCT 1 01/02/2021    CMP:  Lab Results  Component Value Date   NA 139 01/07/2021   K 3.9 01/07/2021   CL 104 01/07/2021   CO2 24 01/07/2021   BUN 88 (H) 01/07/2021   CREATININE 2.96 (H) 01/07/2021   CREATININE 2.17 (H) 09/20/2019   PROT 7.3 03/06/2020   ALBUMIN 2.9 (L) 01/07/2021   BILITOT 1.0 03/06/2020   ALKPHOS 37 (L) 03/06/2020   AST 41 03/06/2020   ALT 26 03/06/2020  .   Total Discharge time is about 33 minutes  Roxan Hockey M.D on 01/07/2021 at 4:05 PM  Go to [email or web address] -  for contact info  [facility] - Office  [phone]

## 2021-01-07 NOTE — Progress Notes (Signed)
Pt alert but drowsy. Unable to safely administer medications at this time. Pt received Ativan this am 0300. MD aware.HOB greater than 45 degrees. VSS.  Bed alarm on. Call bell within reach. Will continue to monitor.

## 2021-01-07 NOTE — TOC Transition Note (Signed)
Transition of Care [facility]) - CM/SW Discharge Note   Patient Details  Name: Kyle Schultz MRN: [id number] Date of Birth: 27-Sep-1929  [facility] CM/SW Contact:  Ihor Gully, LCSW Phone Number: 01/07/2021, 4:43 PM   Clinical Narrative:   Discharge clinicals sent to facility. TOC signing off.    Final next level of care: Skilled Nursing Facility Barriers to Discharge: No Barriers Identified   Patient Goals and CMS Choice Patient states their goals for this hospitalization and ongoing recovery are:: none identified      Discharge Placement              Patient chooses bed at: Other - please specify in the comment section below: ([facility]) Patient to be transferred to facility by: [facility] Name of family member notified: son Patient and family notified of of transfer: 01/07/21  Discharge Plan and Services     Post Acute Care Choice: [place]                               Social Determinants of Health (SDOH) Interventions     Readmission Risk Interventions No flowsheet data found.

## 2021-01-07 NOTE — Progress Notes (Signed)
Report given to [facility], Therapist, sports at [place]. Discharge instructions sent in packet to [facility] via EMS. Pt belongings sent with EMS.

## 2021-01-14 IMAGING — DX DG KNEE COMPLETE 4+V*L*
4 series · 4 of 4 positions shown · non-contrast
Comparison: None.

CLINICAL DATA: Fall, pain

EXAM:
LEFT KNEE - COMPLETE 4+ VIEW

[knee obl (1 of 2)]
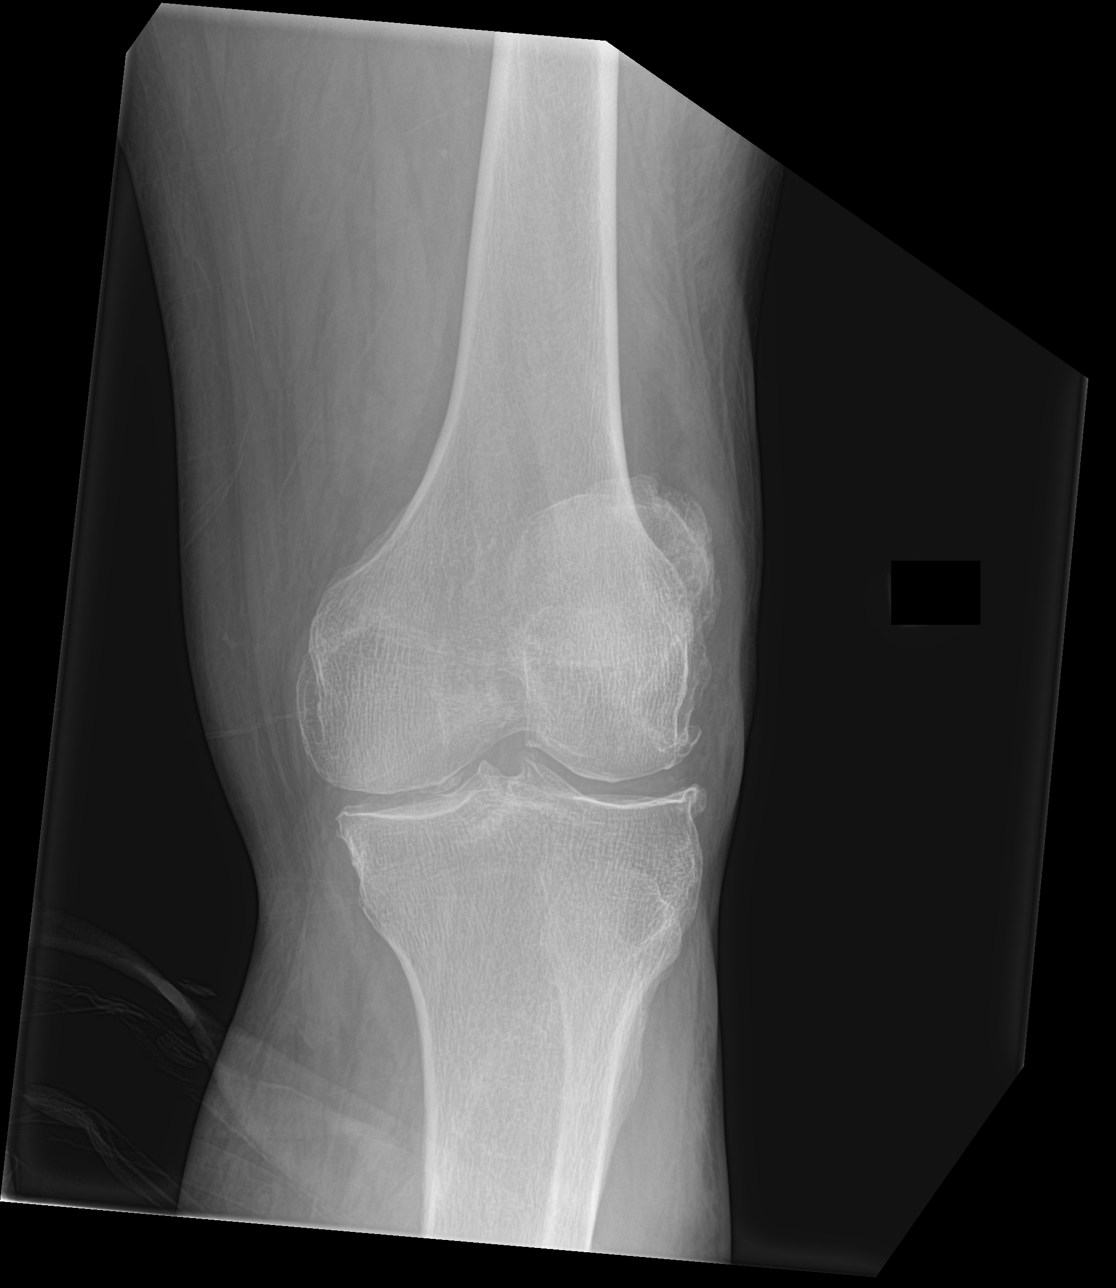

[knee lat]
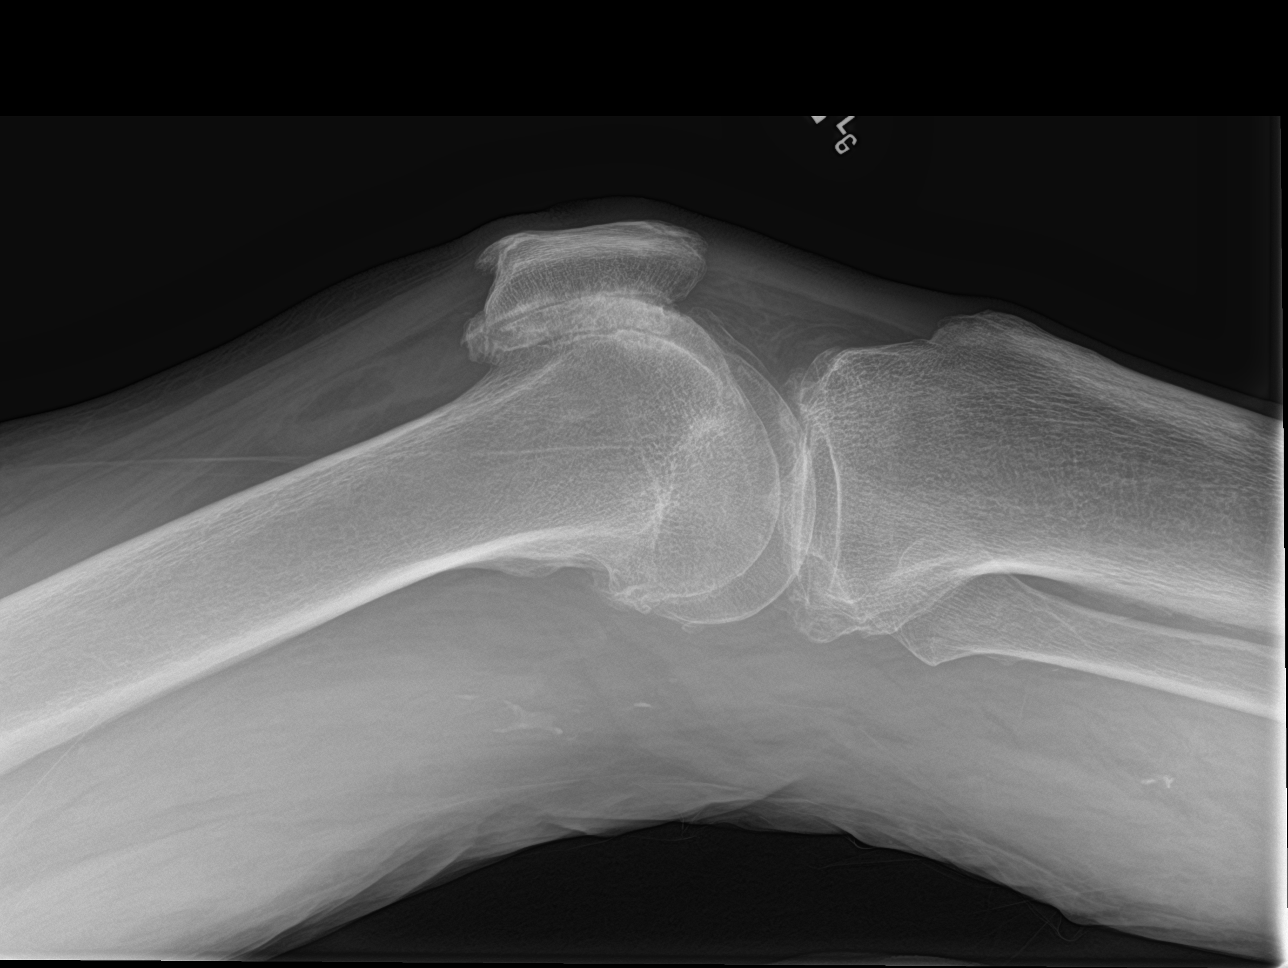

[knee ap]
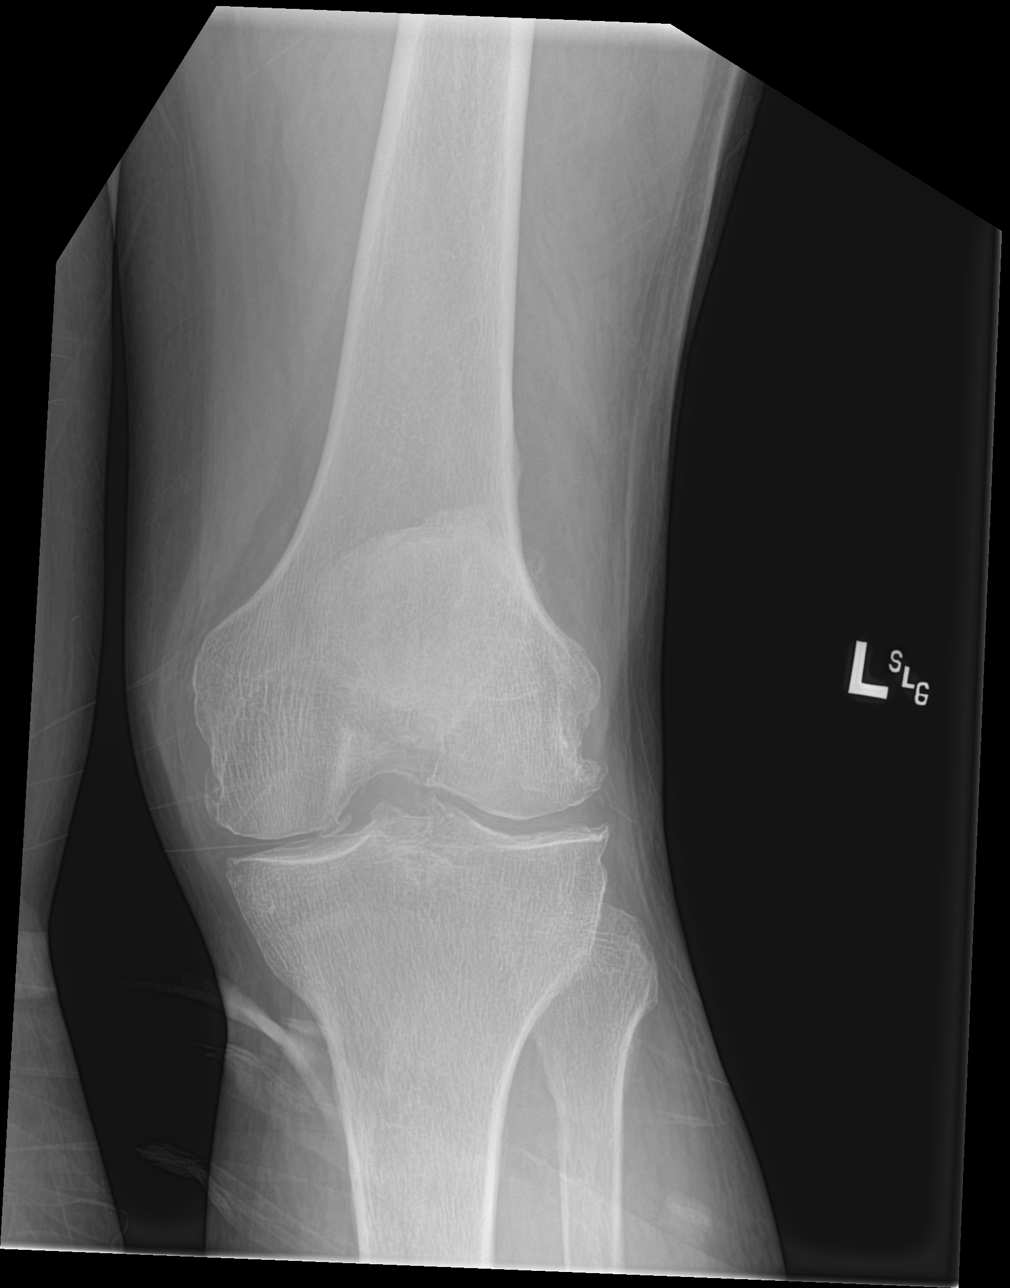

[knee obl (2 of 2)]
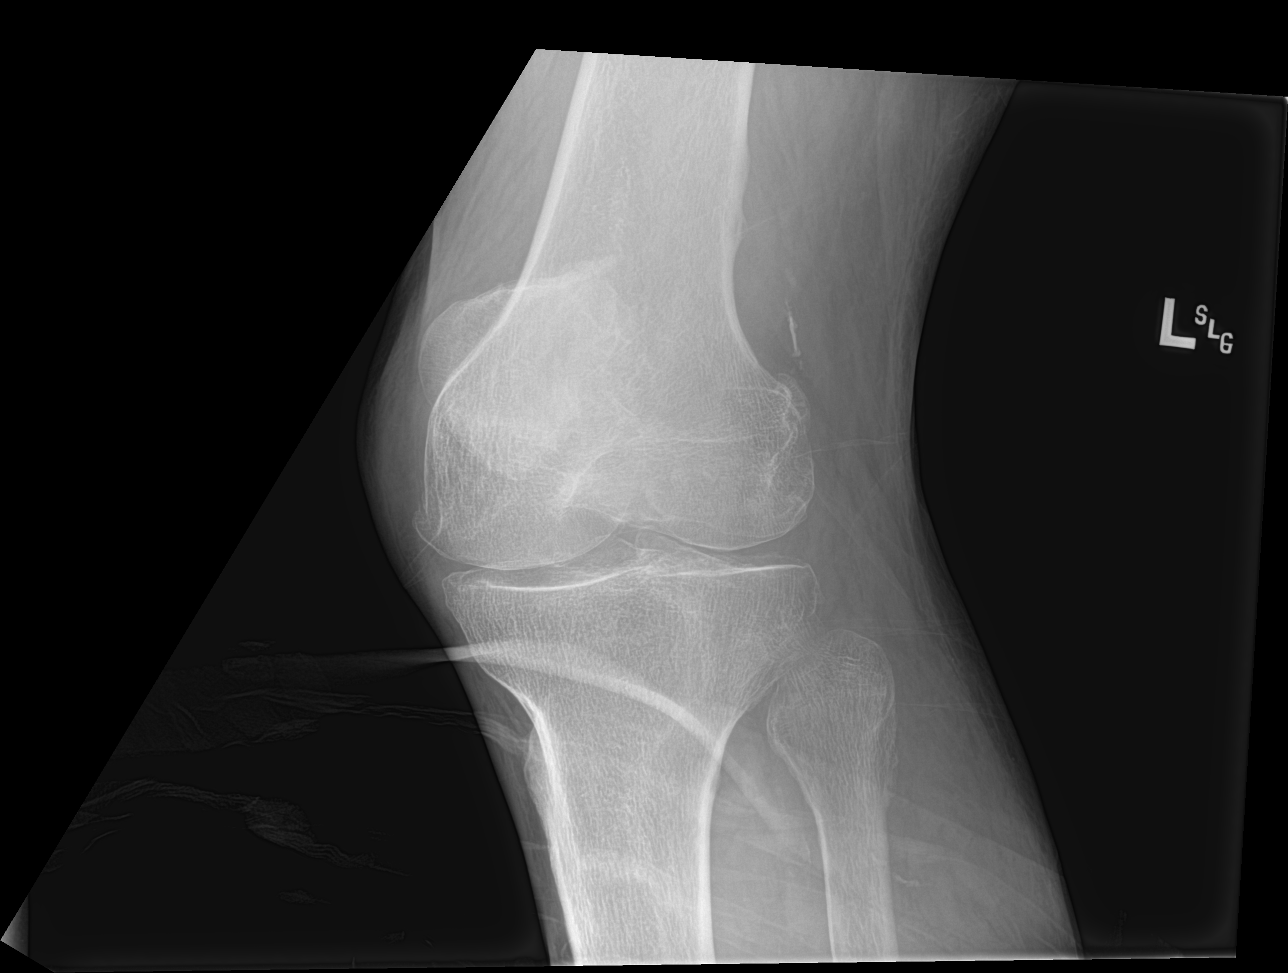

[4 of 4 positions shown; findings below may reference images not displayed]

FINDINGS: There is a small lipohemarthrosis present. However on this
radiograph no displaced fracture is seen. There is tricompartmental
osteoarthritis, most notable in the patellofemoral compartment.
There is diffuse osteopenia. Soft tissue swelling seen over the
lateral aspect of the knee. Scattered vascular calcifications.
IMPRESSION: Small lipohemarthrosis present. No displaced fracture on this
radiograph, however this is limited due to diffuse osteopenia. If
further evaluation is required would recommend cross-sectional
imaging.

## 2021-01-14 IMAGING — CT CT HEAD W/O CM
3 series · 15 of 47 positions shown, 18 images · non-contrast
Comparison: None.

CLINICAL DATA: Tripped on concrete today, facial and head trauma.
No loss of consciousness. On anticoagulation.

EXAM:
CT HEAD WITHOUT CONTRAST
TECHNIQUE: Contiguous axial images were obtained from the base of the skull
through the vertex without intravenous contrast.

[Series 2: head w o · axial · 0.47mm/px · z∈[+37,+162]mm · 9 of 31 slices shown, 12 images]
[im 3/31  brain]
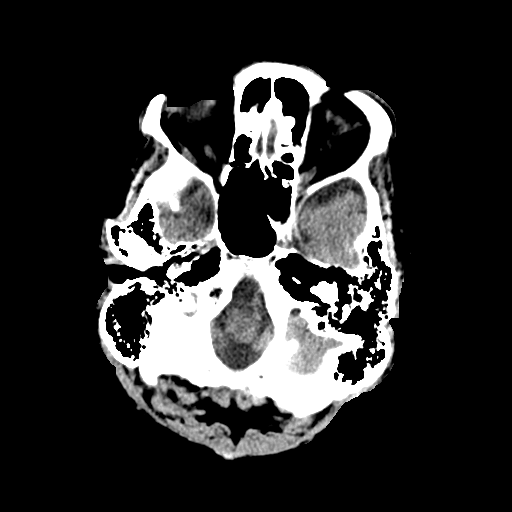
[im 3/31  bone]
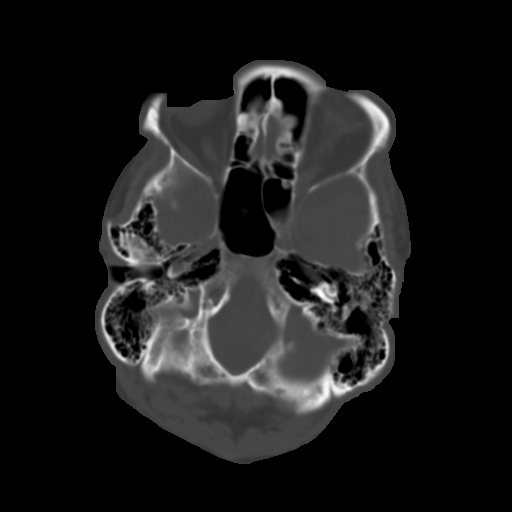
[im 6/31  brain]
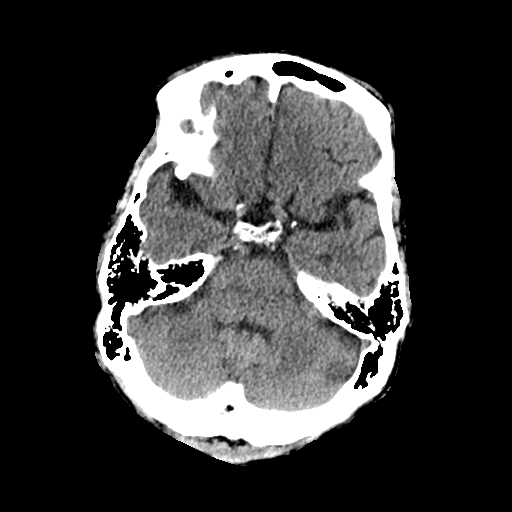
[im 9/31  brain]
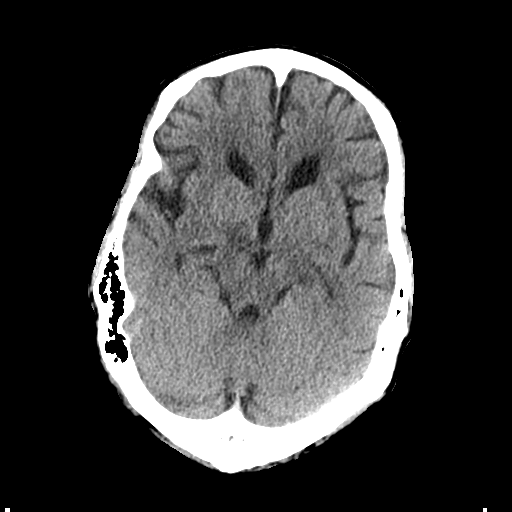
[im 12/31  brain]
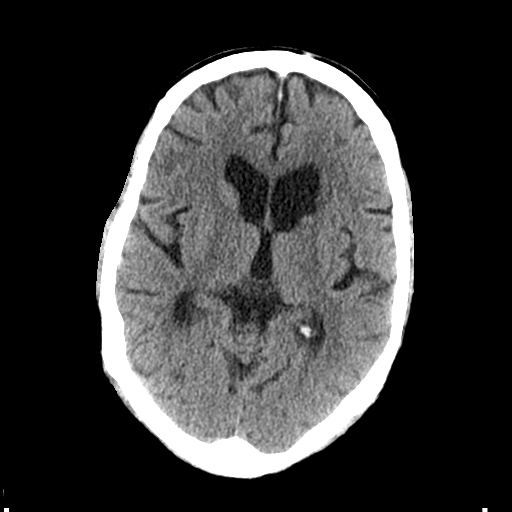
[im 16/31  brain]
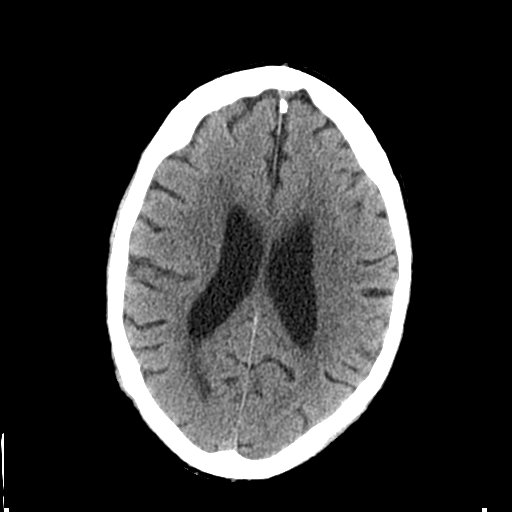
[im 16/31  bone]
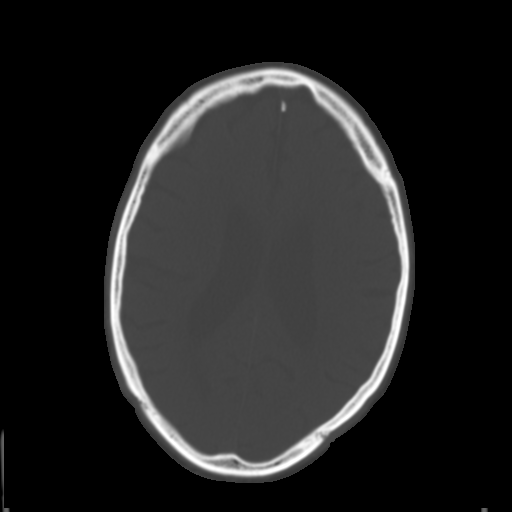
[im 19/31  brain]
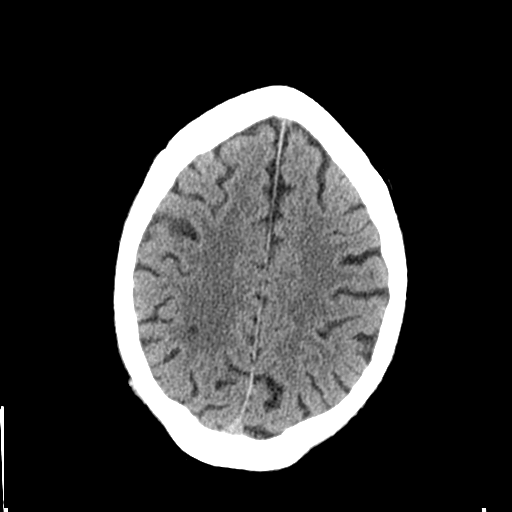
[im 22/31  brain]
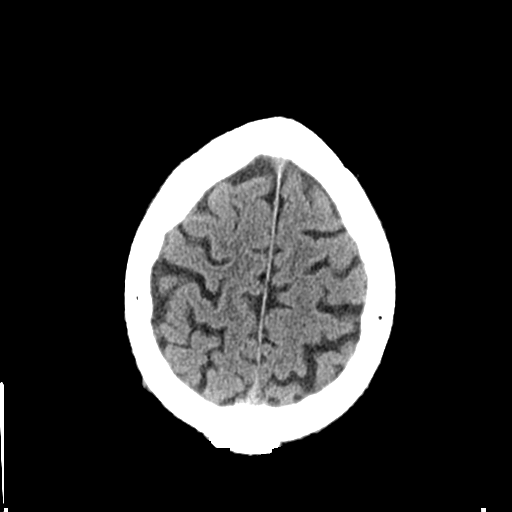
[im 25/31  brain]
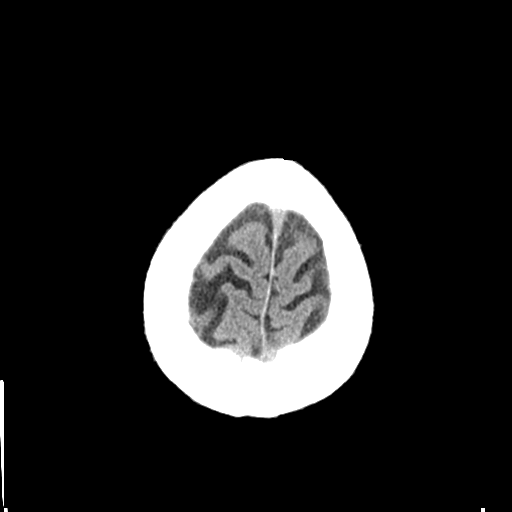
[im 28/31  brain]
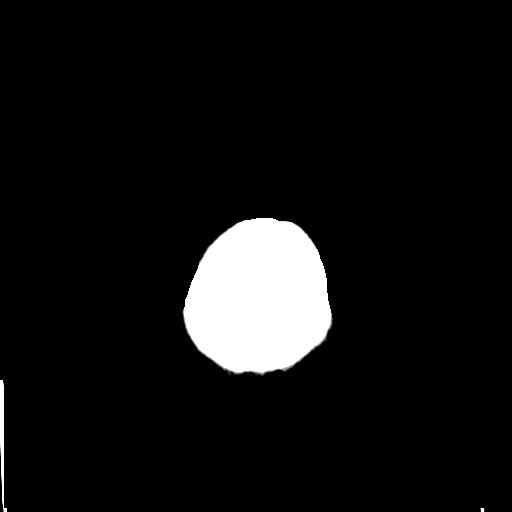
[im 28/31  bone]
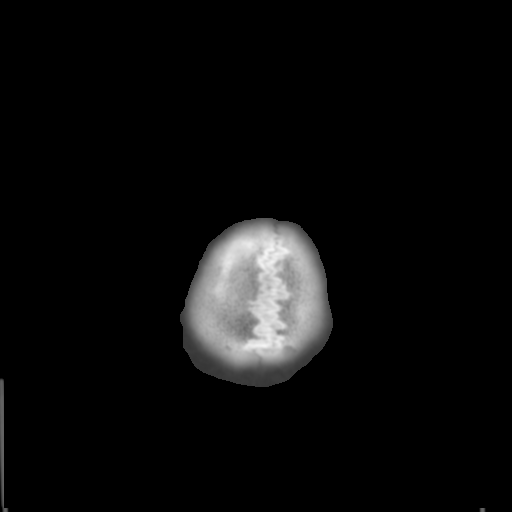

[Series 4: coronal soft · coronal · 0.34mm/px · 3 of 75 slices shown]
[im 25/75  brain]
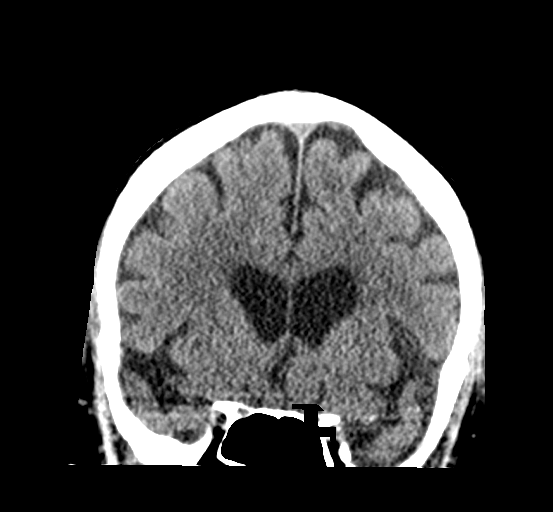
[im 33/75  brain]
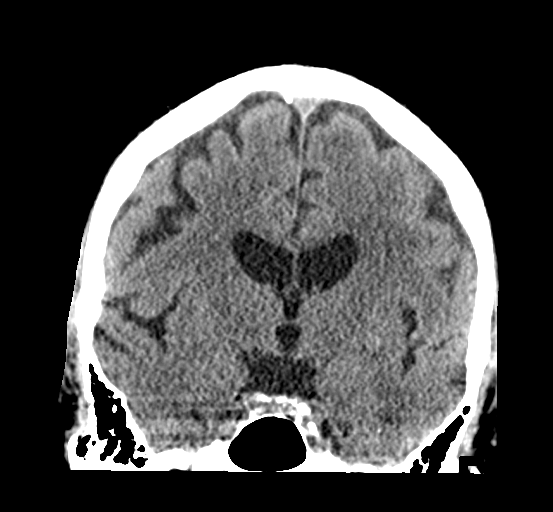
[im 42/75  brain]
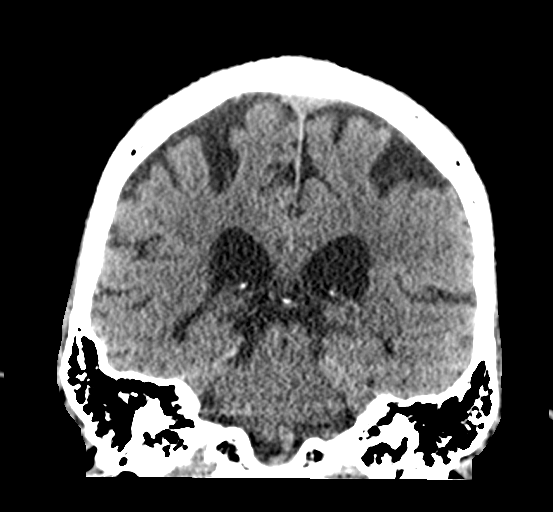

[Series 5: sagittal soft · sagittal · 0.34mm/px · 3 of 66 slices shown]
[im 22/66  brain]
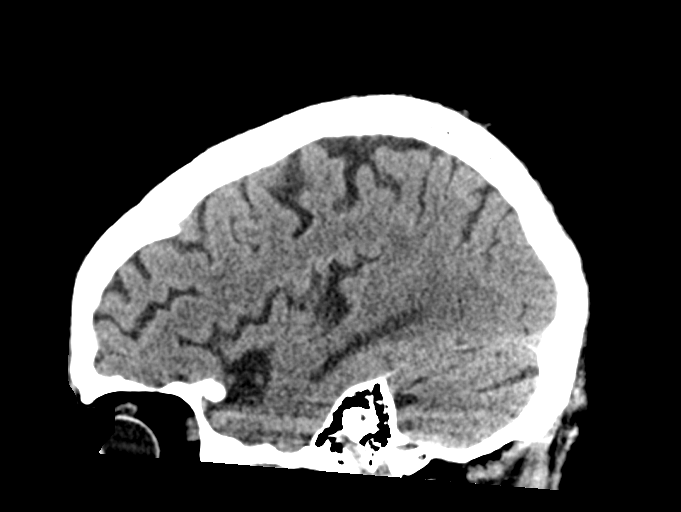
[im 33/66  brain]
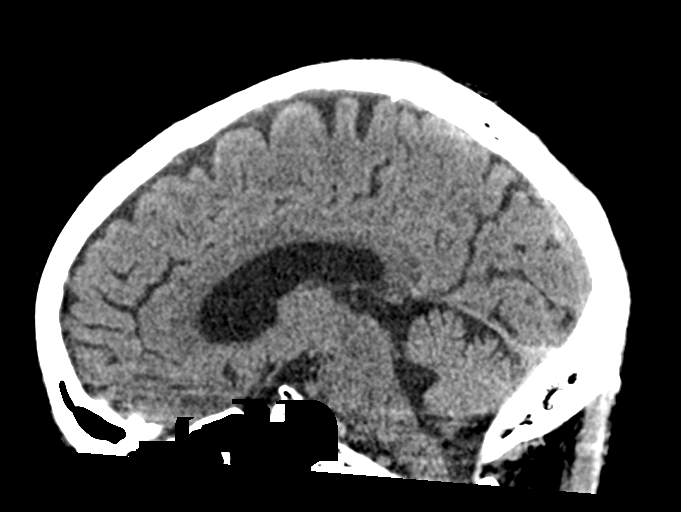
[im 44/66  brain]
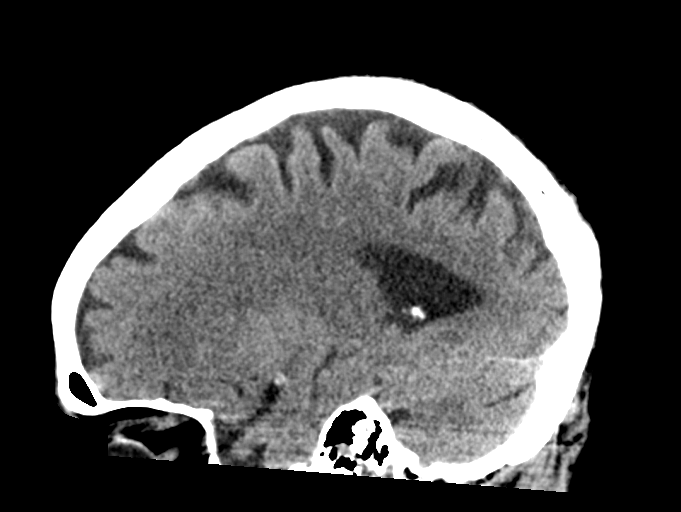

[15 of 47 positions shown; findings below may reference images not displayed]

FINDINGS: Brain: No intracranial hemorrhage, mass effect, or midline shift. 6
mm calcified extra-axial density in the right middle cranial fossa
may represent a calcified meningioma or calvarial hyperostosis, no
associated mass effect. Age related atrophy. Mild chronic small
vessel ischemia. Remote lacunar infarct in the left caudate. No
hydrocephalus. The basilar cisterns are patent. No evidence of
territorial infarct or acute ischemia. No extra-axial or
intracranial fluid collection.

Vascular: Atherosclerosis of skullbase vasculature without
hyperdense vessel or abnormal calcification.

Skull: No skull fracture.

Sinuses/Orbits: Ethmoid sinus mucosal thickening. No visualized
facial bone fracture or sinus fluid level. Mastoid air cells are
well aerated. Included orbits are unremarkable.

Other: None.
IMPRESSION: 1. No acute intracranial abnormality. No skull fracture.
2. Age related atrophy and chronic small vessel ischemia. Remote
lacunar infarct in the left caudate.
3. Small incidental 6 mm calcified extra-axial density in the right
middle cranial fossa may represent a calcified meningioma or
calvarial hyperostosis, no associated mass effect.

## 2021-01-15 NOTE — Progress Notes (Signed)
Remote pacemaker transmission.   

## 2021-01-29 ENCOUNTER — Ambulatory Visit: Payer: Medicare Other | Admitting: Orthopaedic Surgery

## 2021-02-04 NOTE — Progress Notes (Deleted)
Cardiology Office Note   Date:  02/04/2021   ID:  Kyle Schultz, DOB 08-Jan-1930, MRN [id number]  PCP:  Rosita Fire, MD  Cardiologist:  ***   EP  Dr. Lovena Le  No chief complaint on file.  Post hospital   History of Present Illness: Kyle Schultz is a 87 y.o. male who presents for ***   hx of permanent pacemaker and admitted 6/26 with CHF.  Has hx of atrial fib/flutter - low dose eliquis, CKD and HTN.  EF on echo 35-40%, mild LVH, prior Echo in 2019 with EF 50-55%.  + PNA  Secondary cardiomyopathy, possibly underlying ischemic etiology versus secondary to RV pacing.  Plan conservative management at this time without aggressive invasive work-up.  Currently on BiDil and Lopressor, ARB on hold in light of acute on chronic renal insufficiency.  switch from Lopressor to Toprol-XL 25 mg daily.  Continue BiDil.  Hold ARB as before.  Not a good candidate for Aldactone or SGLT2 inhibitor.  Continue present dose of Lasix, creatinine has come down from 3.31 to 2.96.  d/c 6/29  To [facility].        Past Medical History:  Diagnosis Date   Atrial fibrillation and flutter (HCC)    Cardiomyopathy ([place])    CKD (chronic kidney disease) stage 3, GFR 30-59 ml/min (HCC)    Complete heart block (HCC)    Diabetes mellitus    Hyperlipidemia    Hypertension    Pacemaker    Sinus bradycardia     Past Surgical History:  Procedure Laterality Date   COLONOSCOPY  2010   Two 4 mm sessile hepatic flexure polyps, one slightly pedunculated 6 to 8 mm sessile descending colon polyp, small internal hemorrhoids. Simple adenomas.    colonscopy     HEMORRHOID SURGERY     PACEMAKER INSERTION  [address] [place]   PPM GENERATOR CHANGEOUT N/A 09/27/2019   Procedure: PPM GENERATOR CHANGEOUT;  Surgeon: Evans Lance, MD;  Location: [facility];  Service: Cardiovascular;  Laterality: N/A;   UMBILICAL HERNIA REPAIR  04/2008     Current Outpatient Medications  Medication Sig Dispense Refill    acetaminophen (TYLENOL) 325 MG tablet Take 2 tablets (650 mg total) by mouth every 4 (four) hours as needed for headache or mild pain. 30 tablet 2   apixaban (ELIQUIS) 2.5 MG TABS tablet Take 1 tablet (2.5 mg total) by mouth 2 (two) times daily. 60 tablet 11   carbamide peroxide (DEBROX) 6.5 % OTIC solution Place 5 drops into both ears 2 (two) times daily. 15 mL 0   docusate sodium (COLACE) 100 MG capsule Take 100 mg by mouth daily as needed for mild constipation.     furosemide (LASIX) 40 MG tablet Take 1 tablet (40 mg total) by mouth daily. 30 tablet 2   glimepiride (AMARYL) 2 MG tablet Take 1 tablet (2 mg total) by mouth every morning. 30 tablet 11   glucose blood test strip 1 each by Other route 2 (two) times daily. Use as instructed 2 x daily. E11.65 EZ MAX 100 each 5   isosorbide-hydrALAZINE (BIDIL) 20-37.5 MG tablet Take 1 tablet by mouth 2 (two) times daily. 30 tablet 2   linagliptin (TRADJENTA) 5 MG TABS tablet Take 5 mg by mouth daily.     Menthol-Methyl Salicylate (MUSCLE RUB) 10-15 % CREA Apply 1 application topically 2 (two) times daily as needed for muscle pain. Both Knees 85 g 0   metoprolol succinate (  TOPROL-XL) 25 MG 24 hr tablet Take 1 tablet (25 mg total) by mouth daily. 30 tablet 3   OLANZapine (ZYPREXA) 2.5 MG tablet Take 1 tablet (2.5 mg total) by mouth at bedtime as needed (Dementia /insomnia/restlessness/agitation). 30 tablet 2   simvastatin (ZOCOR) 20 MG tablet Take 20 mg by mouth at bedtime.     No current facility-administered medications for this visit.    Allergies:   Other    Social History:  The patient  reports that he has never smoked. He has never used smokeless tobacco. He reports that he does not drink alcohol and does not use drugs.   Family History:  The patient's ***family history includes ALS in his sister; Cancer in his sister, sister, and sister; Heart disease in his father.    ROS:  General:no colds or fevers, no weight changes Skin:no rashes or  ulcers HEENT:no blurred vision, no congestion CV:see HPI PUL:see HPI GI:no diarrhea constipation or melena, no indigestion GU:no hematuria, no dysuria MS:no joint pain, no claudication Neuro:no syncope, no lightheadedness Endo:no diabetes, no thyroid disease Wt Readings from Last 3 Encounters:  01/06/21 185 lb 10 oz (84.2 kg)  10/28/20 191 lb (86.6 kg)  03/06/20 192 lb (87.1 kg)     PHYSICAL EXAM: VS:  There were no vitals taken for this visit. , BMI There is no height or weight on file to calculate BMI. General:Pleasant affect, NAD Skin:Warm and dry, brisk capillary refill HEENT:normocephalic, sclera clear, mucus membranes moist Neck:supple, no JVD, no bruits  Heart:S1S2 RRR without murmur, gallup, rub or click Lungs:clear without rales, rhonchi, or wheezes [id number], non tender, + BS, do not palpate liver spleen or masses Ext:no lower ext edema, 2+ pedal pulses, 2+ radial pulses Neuro:alert and oriented, MAE, follows commands, + facial symmetry    EKG:  EKG is ordered today. The ekg ordered today demonstrates ***   Recent Labs: 03/06/2020: ALT 26 01/02/2021: B Natriuretic Peptide 534.0 01/07/2021: BUN 88; Creatinine, Ser 2.96; Hemoglobin 9.6; Platelets 241; Potassium 3.9; Sodium 139    Lipid Panel    Component Value Date/Time   CHOL 130 07/07/2017 0000   TRIG 101 07/07/2017 0000   HDL 55 07/07/2017 0000   CHOLHDL 4.3 Ratio 08/01/2009 1829   VLDL 33 08/01/2009 1829   LDLCALC 57 07/07/2017 0000       Other studies Reviewed: Additional studies/ records that were reviewed today include: ***.   ASSESSMENT AND PLAN:  1.  ***   Current medicines are reviewed with the patient today.  The patient Has no concerns regarding medicines.  The following changes have been made:  See above Labs/ tests ordered today include:see above  Disposition:   FU:  see above  Signed, Cecilie Kicks, NP  02/04/2021 9:49 PM    [facility] [place],  [place], [place], [place] Phone: [phone]; Fax: [phone]

## 2021-02-05 ENCOUNTER — Ambulatory Visit: Payer: Medicare Other | Admitting: Cardiology

## 2021-03-03 ENCOUNTER — Emergency Department (HOSPITAL_COMMUNITY): Payer: Medicare Other

## 2021-03-03 ENCOUNTER — Emergency Department (HOSPITAL_COMMUNITY)
Admission: EM | Admit: 2021-03-03 | Discharge: 2021-03-03 | Disposition: A | Discharge status: Home / Self Care | Payer: Medicare Other | Attending: Emergency Medicine | Admitting: Emergency Medicine

## 2021-03-03 ENCOUNTER — Encounter (HOSPITAL_COMMUNITY): Payer: Self-pay | Admitting: Emergency Medicine

## 2021-03-03 ENCOUNTER — Other Ambulatory Visit: Payer: Self-pay

## 2021-03-03 DIAGNOSIS — Z79899 Other long term (current) drug therapy: Secondary | ICD-10-CM | POA: Diagnosis not present

## 2021-03-03 DIAGNOSIS — W19XXXA Unspecified fall, initial encounter: Secondary | ICD-10-CM

## 2021-03-03 DIAGNOSIS — M25562 Pain in left knee: Secondary | ICD-10-CM | POA: Diagnosis not present

## 2021-03-03 DIAGNOSIS — I4891 Unspecified atrial fibrillation: Secondary | ICD-10-CM | POA: Insufficient documentation

## 2021-03-03 DIAGNOSIS — I5043 Acute on chronic combined systolic (congestive) and diastolic (congestive) heart failure: Secondary | ICD-10-CM | POA: Diagnosis not present

## 2021-03-03 DIAGNOSIS — S8992XA Unspecified injury of left lower leg, initial encounter: Secondary | ICD-10-CM | POA: Diagnosis not present

## 2021-03-03 DIAGNOSIS — I130 Hypertensive heart and chronic kidney disease with heart failure and stage 1 through stage 4 chronic kidney disease, or unspecified chronic kidney disease: Secondary | ICD-10-CM | POA: Diagnosis not present

## 2021-03-03 DIAGNOSIS — S79911A Unspecified injury of right hip, initial encounter: Secondary | ICD-10-CM | POA: Insufficient documentation

## 2021-03-03 DIAGNOSIS — E1159 Type 2 diabetes mellitus with other circulatory complications: Secondary | ICD-10-CM | POA: Diagnosis not present

## 2021-03-03 DIAGNOSIS — S8002XA Contusion of left knee, initial encounter: Secondary | ICD-10-CM

## 2021-03-03 DIAGNOSIS — S7001XA Contusion of right hip, initial encounter: Secondary | ICD-10-CM

## 2021-03-03 DIAGNOSIS — Z7901 Long term (current) use of anticoagulants: Secondary | ICD-10-CM | POA: Insufficient documentation

## 2021-03-03 DIAGNOSIS — Z950 Presence of cardiac pacemaker: Secondary | ICD-10-CM | POA: Insufficient documentation

## 2021-03-03 DIAGNOSIS — W06XXXA Fall from bed, initial encounter: Secondary | ICD-10-CM | POA: Diagnosis not present

## 2021-03-03 DIAGNOSIS — Z7984 Long term (current) use of oral hypoglycemic drugs: Secondary | ICD-10-CM | POA: Diagnosis not present

## 2021-03-03 DIAGNOSIS — N184 Chronic kidney disease, stage 4 (severe): Secondary | ICD-10-CM | POA: Insufficient documentation

## 2021-03-03 NOTE — ED Triage Notes (Signed)
Pt had fall x hours ago and now c/o right hip and left knee pain.

## 2021-03-03 NOTE — ED Provider Notes (Signed)
[facility] EMERGENCY DEPARTMENT Provider Note   CSN: [id number] Arrival date & time: 03/03/21  0024     History Chief Complaint  Patient presents with   Kyle Schultz is a 87 y.o. male.  Patient is an 87 year old male with extensive past medical history including atrial fibrillation, cardiomyopathy, chronic renal insufficiency, diabetes, hypertension, and pacemaker placement secondary to complete heart block.  Patient presenting today for fall.  Patient attempting to get out of bed today when he slid onto the floor and injured his left knee and right hip.  He denies other injury.  He denies having struck his head or lost consciousness.  The history is provided by the patient.  Fall This is a new problem. The current episode started yesterday. The problem occurs constantly. The problem has not changed since onset.Exacerbated by: Movement and palpation. Nothing relieves the symptoms. He has tried nothing for the symptoms.      Past Medical History:  Diagnosis Date   Atrial fibrillation and flutter ([place])    Cardiomyopathy ([place])    CKD (chronic kidney disease) stage 3, GFR 30-59 ml/min (HCC)    Complete heart block ([place])    Diabetes mellitus    Hyperlipidemia    Hypertension    Pacemaker    Sinus bradycardia     Patient Active Problem List   Diagnosis Date Noted   Acute on chronic combined systolic and diastolic CHF (congestive heart failure) /EF 35 to 40 % 01/07/2021   CKD (chronic kidney disease), stage IV ([place]) 01/07/2021   Acute on chronic congestive heart failure ([place])    Acute on chronic diastolic CHF (congestive heart failure) ([place]) 01/02/2021   History of colonic polyps 03/20/2019   Mixed hyperlipidemia 11/03/2017   Uncontrolled type 2 diabetes mellitus with stage 3 chronic kidney disease ([place]) 08/11/2017   Type 2 diabetes mellitus with other circulatory complications ([place]) [id number]   Leukopenia 03/31/2017   Chest pain 03/31/2017   SIRS (systemic  inflammatory response syndrome) ([place]) 05/31/2016   Fever 05/31/2016   Chronic renal insufficiency, stage III (moderate) ([place]) 06/26/2015   Palpitations 06/26/2015   SINOATRIAL NODE DYSFUNCTION 08/01/2009   Cardiac pacemaker in situ 08/01/2009   Dyslipidemia 03/10/2009   Essential hypertension, benign 03/10/2009    Past Surgical History:  Procedure Laterality Date   COLONOSCOPY  2010   Two 4 mm sessile hepatic flexure polyps, one slightly pedunculated 6 to 8 mm sessile descending colon polyp, small internal hemorrhoids. Simple adenomas.    colonscopy     HEMORRHOID SURGERY     PACEMAKER INSERTION  [address] [place]   PPM GENERATOR CHANGEOUT N/A 09/27/2019   Procedure: PPM GENERATOR CHANGEOUT;  Surgeon: Evans Lance, MD;  Location: [facility];  Service: Cardiovascular;  Laterality: N/A;   UMBILICAL HERNIA REPAIR  04/2008       Family History  Problem Relation Age of Onset   Heart disease Father    Cancer Sister    Cancer Sister    Cancer Sister    ALS Sister    Colon cancer Neg Hx     Social History   Tobacco Use   Smoking status: Never   Smokeless tobacco: Never  Vaping Use   Vaping Use: Never used  Substance Use Topics   Alcohol use: No    Alcohol/week: 0.0 standard drinks   Drug use: No    Home Medications Prior to Admission medications   Medication Sig Start Date End  Date Taking? Authorizing Provider  acetaminophen (TYLENOL) 325 MG tablet Take 2 tablets (650 mg total) by mouth every 4 (four) hours as needed for headache or mild pain. 01/07/21   Roxan Hockey, MD  apixaban (ELIQUIS) 2.5 MG TABS tablet Take 1 tablet (2.5 mg total) by mouth 2 (two) times daily. 10/28/20   Evans Lance, MD  carbamide peroxide (DEBROX) 6.5 % OTIC solution Place 5 drops into both ears 2 (two) times daily. 07/05/19   Domenic Moras, PA-C  docusate sodium (COLACE) 100 MG capsule Take 100 mg by mouth daily as needed for mild constipation.    [provider]  furosemide  (LASIX) 40 MG tablet Take 1 tablet (40 mg total) by mouth daily. 01/08/21   Roxan Hockey, MD  glimepiride (AMARYL) 2 MG tablet Take 1 tablet (2 mg total) by mouth every morning. 01/07/21 01/07/22  Emokpae, Courage, MD  glucose blood test strip 1 each by Other route 2 (two) times daily. Use as instructed 2 x daily. E11.65 EZ MAX 05/01/18   Cassandria Anger, MD  isosorbide-hydrALAZINE (BIDIL) 20-37.5 MG tablet Take 1 tablet by mouth 2 (two) times daily. 01/07/21   Roxan Hockey, MD  linagliptin (TRADJENTA) 5 MG TABS tablet Take 5 mg by mouth daily.    [provider]  Menthol-Methyl Salicylate (MUSCLE RUB) 10-15 % CREA Apply 1 application topically 2 (two) times daily as needed for muscle pain. Both Knees 01/07/21   Roxan Hockey, MD  metoprolol succinate (TOPROL-XL) 25 MG 24 hr tablet Take 1 tablet (25 mg total) by mouth daily. 01/08/21   Roxan Hockey, MD  OLANZapine (ZYPREXA) 2.5 MG tablet Take 1 tablet (2.5 mg total) by mouth at bedtime as needed (Dementia /insomnia/restlessness/agitation). 01/07/21 01/07/22  Roxan Hockey, MD  simvastatin (ZOCOR) 20 MG tablet Take 20 mg by mouth at bedtime. 10/04/20   [provider]    Allergies    Other  Review of Systems   Review of Systems  All other systems reviewed and are negative.  Physical Exam Updated Vital Signs BP (!) 142/92   Pulse 90   Resp 20   Ht 6' (1.829 m)   Wt 84 kg   SpO2 98%   BMI 25.12 kg/m   Physical Exam Vitals and nursing note reviewed.  Constitutional:      General: He is not in acute distress.    Appearance: Normal appearance. He is not ill-appearing or toxic-appearing.  HENT:     Head: Normocephalic and atraumatic.  Cardiovascular:     Rate and Rhythm: Normal rate and regular rhythm.  Pulmonary:     Effort: Pulmonary effort is normal. No respiratory distress.     Breath sounds: Normal breath sounds. No wheezing or rhonchi.  Musculoskeletal:        General: Normal range of motion.      Comments: The left knee appears grossly normal.  There is no obvious effusion palpable.  He has pain with full extension, but the knee appears stable to limited exam.  DP pulses are easily palpable and motor and sensation are intact throughout the entire foot.  There is mild tenderness over the lateral aspect of the right hip.  He has good range of motion and PMS is intact distally.  Skin:    General: Skin is warm and dry.  Neurological:     Mental Status: He is alert.    ED Results / Procedures / Treatments   Labs (all labs ordered are listed, but only abnormal  results are displayed) Labs Reviewed - No data to display  EKG None  Radiology DG Knee Complete 4 Views Left  Result Date: 03/03/2021 CLINICAL DATA:  Fall and left knee pain. EXAM: LEFT KNEE - COMPLETE 4+ VIEW COMPARISON:  Left knee radiograph dated 11/24/2019. FINDINGS: There is no acute fracture or dislocation. Mild osteopenia. There is arthritic changes with tricompartmental narrowing and spurring. Small suprapatellar effusion. The soft tissues are grossly unremarkable. IMPRESSION: 1. No acute fracture or dislocation. 2. Osteoarthritic changes with a small suprapatellar effusion. Electronically Signed   By: Anner Crete M.D.   On: 03/03/2021 01:32   DG Hip Unilat W or Wo Pelvis 2-3 Views Right  Result Date: 03/03/2021 CLINICAL DATA:  Right hip pain after a fall.  Good range of motion. EXAM: DG HIP (WITH OR WITHOUT PELVIS) 2-3V RIGHT COMPARISON:  None. FINDINGS: Degenerative changes in the lower lumbar spine and hips. Pelvis and right hip appear intact. No acute fracture or dislocation. No focal bone lesion or bone destruction. Soft tissues are unremarkable. Calcified phleboliths in the pelvis. Vascular calcifications. IMPRESSION: Degenerative changes in the right hip. No acute displaced fractures identified. Electronically Signed   By: Lucienne Capers M.D.   On: 03/03/2021 01:41    Procedures Procedures   Medications  Ordered in ED Medications - No data to display  ED Course  I have reviewed the triage vital signs and the nursing notes.  Pertinent labs & imaging results that were available during my care of the patient were reviewed by me and considered in my medical decision making (see chart for details).    MDM Rules/Calculators/A&P  X-rays of the hip and knee are both unremarkable.  At this point, patient seems appropriate for discharge with outpatient follow-up as needed.  Patient lives with his sister who was asked and seems comfortable with him returning home with her.  Final Clinical Impression(s) / ED Diagnoses Final diagnoses:  None    Rx / DC Orders ED Discharge Orders     None        Veryl Speak, MD 03/03/21 [phone]

## 2021-03-03 NOTE — Discharge Instructions (Addendum)
Continue medications as previously prescribed.  Weightbearing as tolerated.  Follow-up with your primary doctor if symptoms are not improving in the next week.

## 2021-03-03 NOTE — ED Notes (Signed)
Discharge instructions discussed with caregiver at bedside. Caregiver and pt were able to verbalize understanding of instructions.   Pt bed bound. Caregiver requires ambulance to transport home. Secretary requesting transport.

## 2021-03-22 NOTE — Progress Notes (Deleted)
Cardiology Office Note  Date: 03/22/2021   ID: Kyle Schultz, DOB 27-May-1930, MRN [id number]  PCP:  Rosita Fire, MD  Cardiologist:  None Electrophysiologist:  Cristopher Peru, MD   Chief Complaint: Hospital follow up   History of Present Illness: Kyle Schultz is a 87 y.o. male with a history of Atrial fibrillation, complete heart block, pacemaker, DM2, CKD 3, cardiomyopathy,HLD, HTN.  He was last seen by Dr. Lovena Le on 10/28/2020 for follow-up for complete heart block and persistent atrial fibrillation/flutter, chronic diastolic CHF.  History of hypertension and persistent atypical atrial flutter.  History of noncompliance.  Had no syncopal episode he had done well since last visit had no chest pain.  Had some leg swelling.  He was asymptomatic status post PPM insertion.  His [employer] DDD pacemaker was working normally.  His blood pressure was controlled he was ventricular rate was controlled in atrial fibrillation/flutter.  He presented to the emergency department for evaluation of worsening bilateral lower extremity edema over the prior 5 days.  Denied any chest pain, shortness of breath, fever, cough.  He was not very compliant with low-salt diet.  He was eating a lot of food outside and was noncompliant with Lasix and was using it as needed.  In the ED he had 3+ lower extremity edema, elevated BNP, chest x-ray with significant volume overload/questionable pneumonia.  His creatinine was elevated at 2.3 which was his baseline.  He was started on Lasix and admitted.  His 2D echo demonstrated mild LVH, positive arteriogram ascending decreased EF 35 to 40%.  Cardiology consulted and recommended medical therapy over invasive options.  Given age and CKD history plan was to hold losartan due to worsening renal function.  To use isosorbide dinitrate instead.  Continue Toprol-XL as ordered and Lasix 40 mg daily. His CHA2DS2-VASc score was 5. Continuing Eliquis.      Past Medical History:   Diagnosis Date   Atrial fibrillation and flutter (HCC)    Cardiomyopathy ([place])    CKD (chronic kidney disease) stage 3, GFR 30-59 ml/min (HCC)    Complete heart block (HCC)    Diabetes mellitus    Hyperlipidemia    Hypertension    Pacemaker    Sinus bradycardia     Past Surgical History:  Procedure Laterality Date   COLONOSCOPY  2010   Two 4 mm sessile hepatic flexure polyps, one slightly pedunculated 6 to 8 mm sessile descending colon polyp, small internal hemorrhoids. Simple adenomas.    colonscopy     HEMORRHOID SURGERY     PACEMAKER INSERTION  [address] jude   PPM GENERATOR CHANGEOUT N/A 09/27/2019   Procedure: PPM GENERATOR CHANGEOUT;  Surgeon: Evans Lance, MD;  Location: [facility];  Service: Cardiovascular;  Laterality: N/A;   UMBILICAL HERNIA REPAIR  04/2008    Current Outpatient Medications  Medication Sig Dispense Refill   acetaminophen (TYLENOL) 325 MG tablet Take 2 tablets (650 mg total) by mouth every 4 (four) hours as needed for headache or mild pain. 30 tablet 2   apixaban (ELIQUIS) 2.5 MG TABS tablet Take 1 tablet (2.5 mg total) by mouth 2 (two) times daily. 60 tablet 11   carbamide peroxide (DEBROX) 6.5 % OTIC solution Place 5 drops into both ears 2 (two) times daily. 15 mL 0   docusate sodium (COLACE) 100 MG capsule Take 100 mg by mouth daily as needed for mild constipation.     furosemide (LASIX) 40 MG tablet Take  1 tablet (40 mg total) by mouth daily. 30 tablet 2   glimepiride (AMARYL) 2 MG tablet Take 1 tablet (2 mg total) by mouth every morning. 30 tablet 11   glucose blood test strip 1 each by Other route 2 (two) times daily. Use as instructed 2 x daily. E11.65 EZ MAX 100 each 5   isosorbide-hydrALAZINE (BIDIL) 20-37.5 MG tablet Take 1 tablet by mouth 2 (two) times daily. 30 tablet 2   linagliptin (TRADJENTA) 5 MG TABS tablet Take 5 mg by mouth daily.     Menthol-Methyl Salicylate (MUSCLE RUB) 10-15 % CREA Apply 1 application topically 2 (two)  times daily as needed for muscle pain. Both Knees 85 g 0   metoprolol succinate (TOPROL-XL) 25 MG 24 hr tablet Take 1 tablet (25 mg total) by mouth daily. 30 tablet 3   OLANZapine (ZYPREXA) 2.5 MG tablet Take 1 tablet (2.5 mg total) by mouth at bedtime as needed (Dementia /insomnia/restlessness/agitation). 30 tablet 2   simvastatin (ZOCOR) 20 MG tablet Take 20 mg by mouth at bedtime.     No current facility-administered medications for this visit.   Allergies:  Other   Social History: The patient  reports that he has never smoked. He has never used smokeless tobacco. He reports that he does not drink alcohol and does not use drugs.   Family History: The patient's family history includes ALS in his sister; Cancer in his sister, sister, and sister; Heart disease in his father.   ROS:  Please see the history of present illness. Otherwise, complete review of systems is positive for {NONE DEFAULTED:18576}.  All other systems are reviewed and negative.   Physical Exam: VS:  There were no vitals taken for this visit., BMI There is no height or weight on file to calculate BMI.  Wt Readings from Last 3 Encounters:  03/03/21 185 lb 3 oz (84 kg)  01/06/21 185 lb 10 oz (84.2 kg)  10/28/20 191 lb (86.6 kg)    General: Patient appears comfortable at rest. HEENT: Conjunctiva and lids normal, oropharynx clear with moist mucosa. Neck: Supple, no elevated JVP or carotid bruits, no thyromegaly. Lungs: Clear to auscultation, nonlabored breathing at rest. Cardiac: Regular rate and rhythm, no S3 or significant systolic murmur, no pericardial rub. Abdomen: Soft, nontender, no hepatomegaly, bowel sounds present, no guarding or rebound. Extremities: No pitting edema, distal pulses 2+. Skin: Warm and dry. Musculoskeletal: No kyphosis. Neuropsychiatric: Alert and oriented x3, affect grossly appropriate.  ECG:  {EKG/Telemetry Strips Reviewed:[id number]}  Recent Labwork: 01/02/2021: B Natriuretic Peptide  534.0 01/07/2021: BUN 88; Creatinine, Ser 2.96; Hemoglobin 9.6; Platelets 241; Potassium 3.9; Sodium 139     Component Value Date/Time   CHOL 130 07/07/2017 0000   TRIG 101 07/07/2017 0000   HDL 55 07/07/2017 0000   CHOLHDL 4.3 Ratio 08/01/2009 1829   VLDL 33 08/01/2009 1829   LDLCALC 57 07/07/2017 0000    Other Studies Reviewed Today:   Assessment and Plan:  1. Acute on chronic combined systolic (congestive) and diastolic (congestive) heart failure ([place])   2. Essential hypertension   3. Permanent atrial fibrillation ([place])   4. CKD (chronic kidney disease), stage IV (HCC)      Medication Adjustments/Labs and Tests Ordered: Current medicines are reviewed at length with the patient today.  Concerns regarding medicines are outlined above.   Disposition: Follow-up with ***  Signed, Levell July, NP 03/22/2021 9:32 PM    [facility] at [facility], [place],  [place] [address] Phone: [phone]; Fax: [phone]

## 2021-03-23 ENCOUNTER — Ambulatory Visit: Payer: Medicare Other | Admitting: Family Medicine

## 2021-03-23 DIAGNOSIS — N184 Chronic kidney disease, stage 4 (severe): Secondary | ICD-10-CM

## 2021-03-23 DIAGNOSIS — I5043 Acute on chronic combined systolic (congestive) and diastolic (congestive) heart failure: Secondary | ICD-10-CM

## 2021-03-23 DIAGNOSIS — I10 Essential (primary) hypertension: Secondary | ICD-10-CM

## 2021-03-23 DIAGNOSIS — I4821 Permanent atrial fibrillation: Secondary | ICD-10-CM

## 2021-03-26 ENCOUNTER — Ambulatory Visit (INDEPENDENT_AMBULATORY_CARE_PROVIDER_SITE_OTHER): Payer: Medicare Other

## 2021-03-26 ENCOUNTER — Other Ambulatory Visit: Payer: Self-pay | Admitting: Internal Medicine

## 2021-03-26 DIAGNOSIS — I442 Atrioventricular block, complete: Secondary | ICD-10-CM | POA: Diagnosis not present

## 2021-03-28 LAB — CUP PACEART REMOTE DEVICE CHECK
Battery Remaining Longevity: 98 mo
Battery Remaining Percentage: 87 %
Battery Voltage: 3.01 V
Brady Statistic AP VP Percent: 0 %
Brady Statistic AP VS Percent: 0 %
Brady Statistic AS VP Percent: 0 %
Brady Statistic AS VS Percent: 0 %
Brady Statistic RA Percent Paced: 1 %
Brady Statistic RV Percent Paced: 98 %
Date Time Interrogation Session: 20220915020023
Implantable Lead Implant Date: 20100715
Implantable Lead Implant Date: 20100715
Implantable Lead Location: 753859
Implantable Lead Location: 753860
Implantable Pulse Generator Implant Date: 20210318
Lead Channel Impedance Value: 330 Ohm
Lead Channel Impedance Value: 330 Ohm
Lead Channel Pacing Threshold Amplitude: 0.75 V
Lead Channel Pacing Threshold Pulse Width: 0.5 ms
Lead Channel Sensing Intrinsic Amplitude: 12 mV
Lead Channel Sensing Intrinsic Amplitude: 2.7 mV
Lead Channel Setting Pacing Amplitude: 1 V
Lead Channel Setting Pacing Amplitude: 2.5 V
Lead Channel Setting Pacing Pulse Width: 0.5 ms
Lead Channel Setting Sensing Sensitivity: 4 mV
Pulse Gen Model: 2272
Pulse Gen Serial Number: 3803848

## 2021-03-31 ENCOUNTER — Other Ambulatory Visit: Payer: Self-pay

## 2021-03-31 ENCOUNTER — Encounter: Payer: Self-pay | Admitting: Orthopaedic Surgery

## 2021-03-31 ENCOUNTER — Ambulatory Visit: Payer: Medicare Other | Admitting: Orthopaedic Surgery

## 2021-03-31 VITALS — BP 131/56 | HR 64

## 2021-03-31 DIAGNOSIS — M25561 Pain in right knee: Secondary | ICD-10-CM | POA: Diagnosis not present

## 2021-03-31 DIAGNOSIS — G8929 Other chronic pain: Secondary | ICD-10-CM

## 2021-03-31 DIAGNOSIS — E1159 Type 2 diabetes mellitus with other circulatory complications: Secondary | ICD-10-CM

## 2021-03-31 DIAGNOSIS — N1831 Chronic kidney disease, stage 3a: Secondary | ICD-10-CM

## 2021-03-31 DIAGNOSIS — M25562 Pain in left knee: Secondary | ICD-10-CM | POA: Diagnosis not present

## 2021-03-31 NOTE — Progress Notes (Signed)
Subjective:    Patient ID: Kyle Schultz, male    DOB: April 15, 1930, 87 y.o.   MRN: [id number]  HPI He has long history of bilateral knee pain, more on the left.  He was hospitalized earlier this year and then stayed at [facility] until about six weeks ago.  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  He has not been walking for some time.  His left knee will not fully extend.  He has swelling of both knees, popping and pain.  He has distal chronic edema.  He has chronic heart disease, diabetes and renal disease.  He cannot take NSAIDs.  He has no trauma to the knees. I have independently reviewed and interpreted x-rays of this patient done at another site by another physician or qualified health professional.    Review of Systems  Constitutional:  Positive for activity change.  Respiratory:  Positive for shortness of breath.   Cardiovascular:  Positive for palpitations and leg swelling.  Musculoskeletal:  Positive for arthralgias, back pain, gait problem and joint swelling.  All other systems reviewed and are negative. For Review of Systems, all other systems reviewed and are negative.  The following is a summary of the past history medically, past history surgically, known current medicines, social history and family history.  This information is gathered electronically by the computer from prior information and documentation.  I review this each visit and have found including this information at this point in the chart is beneficial and informative.   Past Medical History:  Diagnosis Date   Atrial fibrillation and flutter (HCC)    Cardiomyopathy ([place])    CKD (chronic kidney disease) stage 3, GFR 30-59 ml/min (HCC)    Complete heart block (HCC)    Diabetes mellitus    Hyperlipidemia    Hypertension    Pacemaker    Sinus bradycardia     Past Surgical History:  Procedure Laterality Date   COLONOSCOPY  2010   Two 4 mm sessile hepatic flexure polyps, one  slightly pedunculated 6 to 8 mm sessile descending colon polyp, small internal hemorrhoids. Simple adenomas.    colonscopy     HEMORRHOID SURGERY     PACEMAKER INSERTION  [address]   PPM GENERATOR CHANGEOUT N/A 09/27/2019   Procedure: PPM GENERATOR CHANGEOUT;  Surgeon: Evans Lance, MD;  Location: [facility];  Service: Cardiovascular;  Laterality: N/A;   UMBILICAL HERNIA REPAIR  04/2008    Current Outpatient Medications on File Prior to Visit  Medication Sig Dispense Refill   acetaminophen (TYLENOL) 325 MG tablet Take 2 tablets (650 mg total) by mouth every 4 (four) hours as needed for headache or mild pain. 30 tablet 2   apixaban (ELIQUIS) 2.5 MG TABS tablet Take 1 tablet (2.5 mg total) by mouth 2 (two) times daily. 60 tablet 11   carbamide peroxide (DEBROX) 6.5 % OTIC solution Place 5 drops into both ears 2 (two) times daily. 15 mL 0   docusate sodium (COLACE) 100 MG capsule Take 100 mg by mouth daily as needed for mild constipation.     furosemide (LASIX) 40 MG tablet Take 1 tablet (40 mg total) by mouth daily. 30 tablet 2   glimepiride (AMARYL) 2 MG tablet Take 1 tablet (2 mg total) by mouth every morning. 30 tablet 11   glucose blood test strip 1 each by Other route 2 (two) times daily. Use as instructed 2 x daily.  E11.65 EZ MAX 100 each 5   isosorbide-hydrALAZINE (BIDIL) 20-37.5 MG tablet Take 1 tablet by mouth 2 (two) times daily. 30 tablet 2   linagliptin (TRADJENTA) 5 MG TABS tablet Take 5 mg by mouth daily.     Menthol-Methyl Salicylate (MUSCLE RUB) 10-15 % CREA Apply 1 application topically 2 (two) times daily as needed for muscle pain. Both Knees 85 g 0   metoprolol succinate (TOPROL-XL) 25 MG 24 hr tablet Take 1 tablet (25 mg total) by mouth daily. 30 tablet 3   OLANZapine (ZYPREXA) 2.5 MG tablet Take 1 tablet (2.5 mg total) by mouth at bedtime as needed (Dementia /insomnia/restlessness/agitation). 30 tablet 2   simvastatin (ZOCOR) 20 MG tablet Take 20 mg by mouth  at bedtime.     No current facility-administered medications on file prior to visit.    Social History   Socioeconomic History   Marital status: Widowed    Spouse name: Not on file   Number of children: Not on file   Years of education: Not on file   Highest education level: Not on file  Occupational History   Occupation: Retired, Optometrist Tobacco  Tobacco Use   Smoking status: Never   Smokeless tobacco: Never  Vaping Use   Vaping Use: Never used  Substance and Sexual Activity   Alcohol use: No    Alcohol/week: 0.0 standard drinks   Drug use: No   Sexual activity: Never    Birth control/protection: Abstinence  Other Topics Concern   Not on file  Social History Narrative   Widowed   1 child   Walks daily   Social Determinants of Health   Financial Resource Strain: Not on file  Food Insecurity: Not on file  Transportation Needs: Not on file  Physical Activity: Not on file  Stress: Not on file  Social Connections: Not on file  Intimate Partner Violence: Not on file    Family History  Problem Relation Age of Onset   Heart disease Father    Cancer Sister    Cancer Sister    Cancer Sister    ALS Sister    Colon cancer Neg Hx     BP (!) 131/56   Pulse 64   There is no height or weight on file to calculate BMI.     Objective:   Physical Exam Vitals and nursing note reviewed. Exam conducted with a chaperone present.  Constitutional:      Appearance: He is well-developed.  HENT:     Head: Normocephalic and atraumatic.  Eyes:     Conjunctiva/sclera: Conjunctivae normal.     Pupils: Pupils are equal, round, and reactive to light.  Cardiovascular:     Rate and Rhythm: Normal rate and regular rhythm.  Pulmonary:     Effort: Pulmonary effort is normal.  Abdominal:     Palpations: Abdomen is soft.  Musculoskeletal:     Cervical back: Normal range of motion and neck supple.       Legs:  Skin:    General: Skin is warm and dry.  Neurological:     Mental  Status: He is alert and oriented to person, place, and time.     Cranial Nerves: No cranial nerve deficit.     Motor: No abnormal muscle tone.     Coordination: Coordination normal.     Deep Tendon Reflexes: Reflexes are normal and symmetric. Reflexes normal.  Psychiatric:        Behavior: Behavior normal.  Thought Content: Thought content normal.        Judgment: Judgment normal.          Assessment & Plan:   Encounter Diagnoses  Name Primary?   Bilateral chronic knee pain Yes   Type 2 diabetes mellitus with other circulatory complications (HCC)    Stage 3a chronic kidney disease ([place])    PROCEDURE NOTE:  The patient requests injections of the right knee , verbal consent was obtained.  The right knee was prepped appropriately after time out was performed.   Sterile technique was observed and injection of 1 cc of Celestone '6mg'$  with several cc's of plain xylocaine. Anesthesia was provided by ethyl chloride and a 20-gauge needle was used to inject the knee area. The injection was tolerated well.  A band aid dressing was applied.  The patient was advised to apply ice later today and tomorrow to the injection sight as needed.   PROCEDURE NOTE:  The patient requests injections of the left knee , verbal consent was obtained.  The left knee was prepped appropriately after time out was performed.   Sterile technique was observed and injection of 1 cc of Celestone 6 mg with several cc's of plain xylocaine. Anesthesia was provided by ethyl chloride and a 20-gauge needle was used to inject the knee area. The injection was tolerated well.  A band aid dressing was applied.  The patient was advised to apply ice later today and tomorrow to the injection sight as needed.  He is not a candidate for total knees.  I will see in one month and consider injections again.  Call if any problem.  Precautions discussed.  Electronically Signed Sanjuana Kava, MD 9/20/20221:53 PM

## 2021-04-02 NOTE — Progress Notes (Signed)
Remote pacemaker transmission.   

## 2021-04-16 NOTE — Progress Notes (Signed)
Cardiology Office Note  Date: 04/16/2021   ID: Kyle Schultz, DOB 06/19/1930, MRN [id number]  PCP:  Kyle Fire, MD  Cardiologist:  None Electrophysiologist:  Kyle Peru, MD   Chief Complaint: Hospital follow up   History of Present Illness: Kyle Schultz is a 87 y.o. male with a history of Atrial fibrillation, complete heart block, pacemaker, DM2, CKD 3, cardiomyopathy,HLD, HTN.  He was last seen by Kyle Schultz on 10/28/2020 for follow-up for complete heart block and persistent atrial fibrillation/flutter, chronic diastolic CHF.  History of hypertension and persistent atypical atrial flutter.  History of noncompliance.  Had no syncopal episode he had done well since last visit had no chest pain.  Had some leg swelling.  He was asymptomatic status post PPM insertion.  His [employer] DDD pacemaker was working normally.  His blood pressure was controlled he was ventricular rate was controlled in atrial fibrillation/flutter.  He presented to the emergency department on 01/02/2021 for evaluation of worsening bilateral lower extremity edema over the prior 5 days.  Denied any chest pain, shortness of breath, fever, cough.  He was not very compliant with low-salt diet.  He was eating a lot of food outside and was noncompliant with Lasix and was using it as needed.  In the ED he had 3+ lower extremity edema, elevated BNP, chest x-ray with significant volume overload/questionable pneumonia.  His creatinine was elevated at 2.3 which was his baseline.  He was started on Lasix and admitted.   Given age and CKD history plan was to hold losartan due to worsening renal function.  To use isosorbide dinitrate instead.  Continue Toprol-XL as ordered and Lasix 40 mg daily. His CHA2DS2-VASc score was 5. Continuing Eliquis.  Creatinine had decreased from 3.21-2.96.  Hemoglobin A1c was 6.2%.  He was not a candidate for Farxiga due to progression of renal function  Echocardiogram demonstrated mild LVH, positive  WMA's, EF 35 to 40%.  Cardiology consult recommended medical therapy over invasive options such as RHC and LHC given age and CKD.   He is here today for hospital follow-up.  He denies any issues.  He was unable to weigh today due to leg weakness.  He is not very active on a daily basis.  His blood pressure is well controlled today at 128/64.  Heart rate is 64.  He does have some chronic lower extremity edema.  This appears to be improved based on description from the emergency department notes.  He denies any DOE or SOB, PND, orthopnea.  Denies any palpitations or arrhythmias, orthostatic symptoms, CVA or TIA-like symptoms, bleeding issues, claudication-like symptoms, DVT or PE-like symptoms.  Past Medical History:  Diagnosis Date   Atrial fibrillation and flutter (HCC)    Cardiomyopathy ([place])    CKD (chronic kidney disease) stage 3, GFR 30-59 ml/min (HCC)    Complete heart block (HCC)    Diabetes mellitus    Hyperlipidemia    Hypertension    Pacemaker    Sinus bradycardia     Past Surgical History:  Procedure Laterality Date   COLONOSCOPY  2010   Two 4 mm sessile hepatic flexure polyps, one slightly pedunculated 6 to 8 mm sessile descending colon polyp, small internal hemorrhoids. Simple adenomas.    colonscopy     HEMORRHOID SURGERY     PACEMAKER INSERTION  [address]e   PPM GENERATOR CHANGEOUT N/A 09/27/2019   Procedure: PPM GENERATOR CHANGEOUT;  Surgeon: Kyle Lance, MD;  Location: [facility]  INVASIVE CV LAB;  Service: Cardiovascular;  Laterality: N/A;   UMBILICAL HERNIA REPAIR  04/2008    Current Outpatient Medications  Medication Sig Dispense Refill   acetaminophen (TYLENOL) 325 MG tablet Take 2 tablets (650 mg total) by mouth every 4 (four) hours as needed for headache or mild pain. 30 tablet 2   apixaban (ELIQUIS) 2.5 MG TABS tablet Take 1 tablet (2.5 mg total) by mouth 2 (two) times daily. 60 tablet 11   carbamide peroxide (DEBROX) 6.5 % OTIC solution Place 5 drops into both  ears 2 (two) times daily. 15 mL 0   docusate sodium (COLACE) 100 MG capsule Take 100 mg by mouth daily as needed for mild constipation.     furosemide (LASIX) 40 MG tablet Take 1 tablet (40 mg total) by mouth daily. 30 tablet 2   glimepiride (AMARYL) 2 MG tablet Take 1 tablet (2 mg total) by mouth every morning. 30 tablet 11   glucose blood test strip 1 each by Other route 2 (two) times daily. Use as instructed 2 x daily. E11.65 EZ MAX 100 each 5   isosorbide-hydrALAZINE (BIDIL) 20-37.5 MG tablet Take 1 tablet by mouth 2 (two) times daily. 30 tablet 2   linagliptin (TRADJENTA) 5 MG TABS tablet Take 5 mg by mouth daily.     Menthol-Methyl Salicylate (MUSCLE RUB) 10-15 % CREA Apply 1 application topically 2 (two) times daily as needed for muscle pain. Both Knees 85 g 0   metoprolol succinate (TOPROL-XL) 25 MG 24 hr tablet Take 1 tablet (25 mg total) by mouth daily. 30 tablet 3   OLANZapine (ZYPREXA) 2.5 MG tablet Take 1 tablet (2.5 mg total) by mouth at bedtime as needed (Dementia /insomnia/restlessness/agitation). 30 tablet 2   simvastatin (ZOCOR) 20 MG tablet Take 20 mg by mouth at bedtime.     No current facility-administered medications for this visit.   Allergies:  Other   Social History: The patient  reports that he has never smoked. He has never used smokeless tobacco. He reports that he does not drink alcohol and does not use drugs.   Family History: The patient's family history includes ALS in his sister; Cancer in his sister, sister, and sister; Heart disease in his father.   ROS:  Please see the history of present illness. Otherwise, complete review of systems is positive for none.  All other systems are reviewed and negative.   Physical Exam: VS:  There were no vitals taken for this visit., BMI There is no height or weight on file to calculate BMI.  Wt Readings from Last 3 Encounters:  03/03/21 185 lb 3 oz (84 kg)  01/06/21 185 lb 10 oz (84.2 kg)  10/28/20 191 lb (86.6 kg)     General: Patient appears comfortable at rest. HEENT: Conjunctiva and lids normal, oropharynx clear with moist mucosa. Neck: Supple, no elevated JVP or carotid bruits, no thyromegaly. Lungs: Clear to auscultation, nonlabored breathing at rest. Cardiac: Regular rate and rhythm, no S3 or significant systolic murmur, no pericardial rub. Abdomen: Soft, nontender, no hepatomegaly, bowel sounds present, no guarding or rebound. Extremities: No pitting edema, distal pulses 2+. Skin: Warm and dry. Musculoskeletal: No kyphosis. Neuropsychiatric: Alert and oriented x3, affect grossly appropriate.  ECG:    Recent Labwork: 01/02/2021: B Natriuretic Peptide 534.0 01/07/2021: BUN 88; Creatinine, Ser 2.96; Hemoglobin 9.6; Platelets 241; Potassium 3.9; Sodium 139     Component Value Date/Time   CHOL 130 07/07/2017 0000   TRIG 101 07/07/2017 0000  HDL 55 07/07/2017 0000   CHOLHDL 4.3 Ratio 08/01/2009 1829   VLDL 33 08/01/2009 1829   LDLCALC 57 07/07/2017 0000    Other Studies Reviewed Today:  Echocardiogram 01/03/2021  1. Left ventricular ejection fraction, by estimation, is 35 to 40%. The  left ventricle has moderately decreased function. The left ventricle  demonstrates regional wall motion abnormalities (see scoring  diagram/findings for description). There is mild  left ventricular hypertrophy. Left ventricular diastolic parameters are  indeterminate.   2. Right ventricular systolic function is normal. The right ventricular  size is mildly enlarged. There is moderately elevated pulmonary artery  systolic pressure.   3. Left atrial size was severely dilated.   4. Right atrial size was severely dilated.   5. The mitral valve is normal in structure. Trivial mitral valve  regurgitation. No evidence of mitral stenosis.   6. The aortic valve is tricuspid. Aortic valve regurgitation is trivial.  Mild aortic valve sclerosis is present, with no evidence of aortic valve  stenosis.   7. Aortic  dilatation noted. There is mild dilatation of the ascending  aorta, measuring 39 mm.    Assessment and Plan:  1. Chronic combined systolic (congestive) and diastolic (congestive) heart failure ([place])   2. Essential hypertension   3. Permanent atrial fibrillation ([place])   4. Chronic kidney disease (CKD), stage IV (severe) ([place])    1. Chronic combined systolic (congestive) and diastolic (congestive) heart failure (HCC) Echocardiogram January 03, 2021 EF 35 to 40%, positive WMA's, mild LVH, indeterminate diastolic parameters, mildly elevated pulmonary artery systolic pressure, LA severely dilated, RA severely dilated, trivial MR, AR trivial.  Aortic dilatation noted with mild dilatation of ascending aorta measuring 39 mm.  Continue furosemide 40 mg p.o. daily.  Continue BiDil 20/37.5 mg p.o. twice daily.  Continue losartan 100 mg daily.  Swelling in lower extremities has improved.  No longer 3+ edema.  Bilateral lower extremity edema is 1+ currently.  He was unable to weigh today due to leg weakness.  He is in no acute respiratory distress.  Denies any shortness of breath, PND.  2. Essential hypertension Blood pressure well controlled today at 128/64.  Continue losartan 100 mg daily, Toprol-XL 25 mg daily, BiDil 20/37.5 mg p.o. twice daily.  3. Permanent atrial fibrillation (HCC) Rate is controlled today at 64.  Continue Toprol-XL 25 mg p.o. daily.  Continue 2.5 mg Eliquis p.o. twice daily.    4. Chronic kidney disease (CKD), stage IV (severe) ([place]) He sees Dr. Theador Hawthorne nephrology.  Last renal function labs March 09, 2021 creatinine 1.67 and GFR 39.  Hemoglobin 9.6, hematocrit 31.6   Medication Adjustments/Labs and Tests Ordered: Current medicines are reviewed at length with the patient today.  Concerns regarding medicines are outlined above.   Disposition: Follow-up with new cardiologist to establish in [place] in 3 months  Signed, Levell July, NP 04/16/2021 12:38 PM    [facility], [place], [place] [address] Phone: [phone]; Fax: [phone]

## 2021-04-17 ENCOUNTER — Encounter: Payer: Self-pay | Admitting: Family Medicine

## 2021-04-17 ENCOUNTER — Ambulatory Visit (INDEPENDENT_AMBULATORY_CARE_PROVIDER_SITE_OTHER): Payer: Medicare Other | Admitting: Family Medicine

## 2021-04-17 VITALS — BP 128/64 | HR 64 | Ht 71.0 in

## 2021-04-17 DIAGNOSIS — I4821 Permanent atrial fibrillation: Secondary | ICD-10-CM

## 2021-04-17 DIAGNOSIS — I5042 Chronic combined systolic (congestive) and diastolic (congestive) heart failure: Secondary | ICD-10-CM | POA: Diagnosis not present

## 2021-04-17 DIAGNOSIS — N184 Chronic kidney disease, stage 4 (severe): Secondary | ICD-10-CM | POA: Diagnosis not present

## 2021-04-17 DIAGNOSIS — I10 Essential (primary) hypertension: Secondary | ICD-10-CM | POA: Diagnosis not present

## 2021-04-17 NOTE — Patient Instructions (Addendum)
Medication Instructions:  Your physician recommends that you continue on your current medications as directed. Please refer to the Current Medication list given to you today.  Labwork: none  Testing/Procedures: none  Follow-Up: Your physician recommends that you schedule a follow-up appointment in: 3 months in [place]  Any Other Special Instructions Will Be Listed Below (If Applicable).  If you need a refill on your cardiac medications before your next appointment, please call your pharmacy.

## 2021-04-21 ENCOUNTER — Ambulatory Visit: Payer: Medicare Other | Admitting: Orthopaedic Surgery

## 2021-05-12 ENCOUNTER — Encounter: Payer: Self-pay | Admitting: Orthopaedic Surgery

## 2021-05-12 ENCOUNTER — Ambulatory Visit: Payer: Medicare Other | Admitting: Orthopaedic Surgery

## 2021-05-12 ENCOUNTER — Other Ambulatory Visit: Payer: Self-pay

## 2021-05-12 DIAGNOSIS — N1831 Chronic kidney disease, stage 3a: Secondary | ICD-10-CM

## 2021-05-12 DIAGNOSIS — M25561 Pain in right knee: Secondary | ICD-10-CM | POA: Diagnosis not present

## 2021-05-12 DIAGNOSIS — M25562 Pain in left knee: Secondary | ICD-10-CM | POA: Diagnosis not present

## 2021-05-12 DIAGNOSIS — G8929 Other chronic pain: Secondary | ICD-10-CM | POA: Diagnosis not present

## 2021-05-12 DIAGNOSIS — E1159 Type 2 diabetes mellitus with other circulatory complications: Secondary | ICD-10-CM

## 2021-05-12 NOTE — Progress Notes (Signed)
PROCEDURE NOTE:  The patient requests injections of the left knee , verbal consent was obtained.  The left knee was prepped appropriately after time out was performed.   Sterile technique was observed and injection of 1 cc of DepoMedrol 40 mg with several cc's of plain xylocaine. Anesthesia was provided by ethyl chloride and a 20-gauge needle was used to inject the knee area. The injection was tolerated well.  A band aid dressing was applied.  The patient was advised to apply ice later today and tomorrow to the injection sight as needed.   PROCEDURE NOTE:  The patient requests injections of the right knee , verbal consent was obtained.  The right knee was prepped appropriately after time out was performed.   Sterile technique was observed and injection of 1 cc of DepoMedrol 40mg  with several cc's of plain xylocaine. Anesthesia was provided by ethyl chloride and a 20-gauge needle was used to inject the knee area. The injection was tolerated well.  A band aid dressing was applied.  The patient was advised to apply ice later today and tomorrow to the injection sight as needed.   Encounter Diagnoses  Name Primary?   Bilateral chronic knee pain Yes   Type 2 diabetes mellitus with other circulatory complications (HCC)    Stage 3a chronic kidney disease ([place])    Return in six weeks.  Call if any problem.  Precautions discussed.  Electronically Signed Sanjuana Kava, MD 11/1/202210:01 AM

## 2021-06-23 ENCOUNTER — Ambulatory Visit: Payer: Medicare Other | Admitting: Orthopaedic Surgery

## 2021-06-23 ENCOUNTER — Other Ambulatory Visit: Payer: Self-pay

## 2021-06-23 ENCOUNTER — Encounter: Payer: Self-pay | Admitting: Orthopaedic Surgery

## 2021-06-23 DIAGNOSIS — M25561 Pain in right knee: Secondary | ICD-10-CM

## 2021-06-23 DIAGNOSIS — G8929 Other chronic pain: Secondary | ICD-10-CM | POA: Diagnosis not present

## 2021-06-23 DIAGNOSIS — M25562 Pain in left knee: Secondary | ICD-10-CM

## 2021-06-23 NOTE — Progress Notes (Signed)
PROCEDURE NOTE:  The patient requests injections of the left knee , verbal consent was obtained.  The left knee was prepped appropriately after time out was performed.   Sterile technique was observed and injection of 1 cc of DepoMedrol 40 mg with several cc's of plain xylocaine. Anesthesia was provided by ethyl chloride and a 20-gauge needle was used to inject the knee area. The injection was tolerated well.  A band aid dressing was applied.  The patient was advised to apply ice later today and tomorrow to the injection sight as needed.  PROCEDURE NOTE:  The patient requests injections of the right knee , verbal consent was obtained.  The right knee was prepped appropriately after time out was performed.   Sterile technique was observed and injection of 1 cc of DepoMedrol 40mg  with several cc's of plain xylocaine. Anesthesia was provided by ethyl chloride and a 20-gauge needle was used to inject the knee area. The injection was tolerated well.  A band aid dressing was applied.  The patient was advised to apply ice later today and tomorrow to the injection sight as needed.   Encounter Diagnosis  Name Primary?   Bilateral chronic knee pain Yes   Return in six weeks.  Call if any problem.  Precautions discussed.  Electronically Signed Sanjuana Kava, MD 12/13/20229:43 AM

## 2021-06-23 NOTE — Patient Instructions (Signed)
Follow up in 6 weeks for knee injections

## 2021-06-25 ENCOUNTER — Ambulatory Visit (INDEPENDENT_AMBULATORY_CARE_PROVIDER_SITE_OTHER): Payer: Medicare Other

## 2021-06-25 DIAGNOSIS — I442 Atrioventricular block, complete: Secondary | ICD-10-CM | POA: Diagnosis not present

## 2021-06-25 LAB — CUP PACEART REMOTE DEVICE CHECK
Battery Remaining Longevity: 98 mo
Battery Remaining Percentage: 85 %
Battery Voltage: 3.01 V
Brady Statistic AP VP Percent: 0 %
Brady Statistic AP VS Percent: 0 %
Brady Statistic AS VP Percent: 100 %
Brady Statistic AS VS Percent: 0 %
Brady Statistic RA Percent Paced: 1 %
Brady Statistic RV Percent Paced: 99 %
Date Time Interrogation Session: 20221215020014
Implantable Lead Implant Date: 20100715
Implantable Lead Implant Date: 20100715
Implantable Lead Location: 753859
Implantable Lead Location: 753860
Implantable Pulse Generator Implant Date: 20210318
Lead Channel Impedance Value: 330 Ohm
Lead Channel Impedance Value: 340 Ohm
Lead Channel Pacing Threshold Amplitude: 0.625 V
Lead Channel Pacing Threshold Pulse Width: 0.5 ms
Lead Channel Sensing Intrinsic Amplitude: 12 mV
Lead Channel Sensing Intrinsic Amplitude: 2.7 mV
Lead Channel Setting Pacing Amplitude: 0.875
Lead Channel Setting Pacing Amplitude: 2.5 V
Lead Channel Setting Pacing Pulse Width: 0.5 ms
Lead Channel Setting Sensing Sensitivity: 4 mV
Pulse Gen Model: 2272
Pulse Gen Serial Number: 3803848

## 2021-07-07 NOTE — Progress Notes (Signed)
Remote pacemaker transmission.   

## 2021-07-30 ENCOUNTER — Ambulatory Visit: Payer: Medicare Other | Admitting: Cardiology

## 2021-08-04 ENCOUNTER — Encounter: Payer: Self-pay | Admitting: Orthopaedic Surgery

## 2021-08-04 ENCOUNTER — Other Ambulatory Visit: Payer: Self-pay

## 2021-08-04 ENCOUNTER — Ambulatory Visit (INDEPENDENT_AMBULATORY_CARE_PROVIDER_SITE_OTHER): Payer: Medicare Other | Admitting: Orthopaedic Surgery

## 2021-08-04 DIAGNOSIS — G8929 Other chronic pain: Secondary | ICD-10-CM

## 2021-08-04 DIAGNOSIS — M25561 Pain in right knee: Secondary | ICD-10-CM

## 2021-08-04 DIAGNOSIS — M25562 Pain in left knee: Secondary | ICD-10-CM

## 2021-08-04 NOTE — Progress Notes (Signed)
PROCEDURE NOTE:  The patient requests injections of the left knee , verbal consent was obtained.  The left knee was prepped appropriately after time out was performed.   Sterile technique was observed and injection of 1 cc of DepoMedrol 40 mg with several cc's of plain xylocaine. Anesthesia was provided by ethyl chloride and a 20-gauge needle was used to inject the knee area. The injection was tolerated well.  A band aid dressing was applied.  The patient was advised to apply ice later today and tomorrow to the injection sight as needed.  PROCEDURE NOTE:  The patient requests injections of the right knee , verbal consent was obtained.  The right knee was prepped appropriately after time out was performed.   Sterile technique was observed and injection of 1 cc of DepoMedrol 40mg  with several cc's of plain xylocaine. Anesthesia was provided by ethyl chloride and a 20-gauge needle was used to inject the knee area. The injection was tolerated well.  A band aid dressing was applied.  The patient was advised to apply ice later today and tomorrow to the injection sight as needed.  Encounter Diagnosis  Name Primary?   Bilateral chronic knee pain Yes   Return in six weeks.  Call if any problem.  Precautions discussed.  Electronically Signed Sanjuana Kava, MD 1/24/202310:10 AM

## 2021-09-17 ENCOUNTER — Ambulatory Visit: Payer: Medicare Other | Admitting: Orthopaedic Surgery

## 2021-09-24 ENCOUNTER — Ambulatory Visit (INDEPENDENT_AMBULATORY_CARE_PROVIDER_SITE_OTHER): Payer: Medicare Other

## 2021-09-24 DIAGNOSIS — I442 Atrioventricular block, complete: Secondary | ICD-10-CM | POA: Diagnosis not present

## 2021-09-24 LAB — CUP PACEART REMOTE DEVICE CHECK
Battery Remaining Longevity: 98 mo
Battery Remaining Percentage: 82 %
Battery Voltage: 3.01 V
Brady Statistic AP VP Percent: 6.1 %
Brady Statistic AP VS Percent: 1 %
Brady Statistic AS VP Percent: 93 %
Brady Statistic AS VS Percent: 1 %
Brady Statistic RA Percent Paced: 1 %
Brady Statistic RV Percent Paced: 99 %
Date Time Interrogation Session: 20230316020016
Implantable Lead Implant Date: 20100715
Implantable Lead Implant Date: 20100715
Implantable Lead Location: 753859
Implantable Lead Location: 753860
Implantable Pulse Generator Implant Date: 20210318
Lead Channel Impedance Value: 350 Ohm
Lead Channel Impedance Value: 380 Ohm
Lead Channel Pacing Threshold Amplitude: 0.625 V
Lead Channel Pacing Threshold Pulse Width: 0.5 ms
Lead Channel Sensing Intrinsic Amplitude: 12 mV
Lead Channel Sensing Intrinsic Amplitude: 3.8 mV
Lead Channel Setting Pacing Amplitude: 0.875
Lead Channel Setting Pacing Amplitude: 2.5 V
Lead Channel Setting Pacing Pulse Width: 0.5 ms
Lead Channel Setting Sensing Sensitivity: 4 mV
Pulse Gen Model: 2272
Pulse Gen Serial Number: 3803848

## 2021-09-29 ENCOUNTER — Encounter: Payer: Self-pay | Admitting: Orthopaedic Surgery

## 2021-09-29 ENCOUNTER — Other Ambulatory Visit: Payer: Self-pay

## 2021-09-29 ENCOUNTER — Ambulatory Visit: Payer: Medicare Other | Admitting: Orthopaedic Surgery

## 2021-09-29 DIAGNOSIS — M25562 Pain in left knee: Secondary | ICD-10-CM | POA: Diagnosis not present

## 2021-09-29 DIAGNOSIS — G8929 Other chronic pain: Secondary | ICD-10-CM

## 2021-09-29 DIAGNOSIS — M25561 Pain in right knee: Secondary | ICD-10-CM

## 2021-09-29 DIAGNOSIS — N1831 Chronic kidney disease, stage 3a: Secondary | ICD-10-CM

## 2021-09-29 DIAGNOSIS — E1159 Type 2 diabetes mellitus with other circulatory complications: Secondary | ICD-10-CM

## 2021-09-29 NOTE — Progress Notes (Signed)
PROCEDURE NOTE: ? ?The patient requests injections of the left knee , verbal consent was obtained. ? ?The left knee was prepped appropriately after time out was performed.  ? ?Sterile technique was observed and injection of 1 cc of DepoMedrol 40 mg with several cc's of plain xylocaine. Anesthesia was provided by ethyl chloride and a 20-gauge needle was used to inject the knee area. The injection was tolerated well.  A band aid dressing was applied. ? ?The patient was advised to apply ice later today and tomorrow to the injection sight as needed. ? ?PROCEDURE NOTE: ? ?The patient requests injections of the right knee , verbal consent was obtained. ? ?The right knee was prepped appropriately after time out was performed.  ? ?Sterile technique was observed and injection of 1 cc of DepoMedrol 40mg  with several cc's of plain xylocaine. Anesthesia was provided by ethyl chloride and a 20-gauge needle was used to inject the knee area. The injection was tolerated well.  A band aid dressing was applied. ? ?The patient was advised to apply ice later today and tomorrow to the injection sight as needed. ? ?Encounter Diagnoses  ?Name Primary?  ? Bilateral chronic knee pain Yes  ? Type 2 diabetes mellitus with other circulatory complications ([place])   ? Stage 3a chronic kidney disease ([place])   ? ?I will set up PT at home to increase motion, help with walking and help with deconditioning. ? ?Return in six weeks. ? ?Call if any problem. ? ?Precautions discussed. ? ?Electronically Signed ?Sanjuana Kava, MD ?3/21/20239:52 AM ? ?

## 2021-09-29 NOTE — Addendum Note (Signed)
Addended by: Brand Males E on: 09/29/2021 10:19 AM ? ? Modules accepted: Orders ? ?

## 2021-10-02 NOTE — Progress Notes (Signed)
Remote pacemaker transmission.   

## 2021-10-09 ENCOUNTER — Telehealth: Payer: Self-pay

## 2021-10-09 NOTE — Telephone Encounter (Signed)
Sharyn Lull (Physical Therapist) with [facility] left message  ?saying that she started patient's home health today 10/09/21. ? ?She is wanting to see him as follows: 1 time a week for 1 week, 2 times a week for 3 weeks and then 1 time a week for 4 weeks. ?They will be working on strengthening and ROM, as well as his gait eventually. ? ?She needs a verbal for this and can be reached at [phone]. ? ?

## 2021-11-10 ENCOUNTER — Ambulatory Visit: Payer: Medicare Other | Admitting: Orthopaedic Surgery

## 2021-11-17 ENCOUNTER — Encounter: Payer: Self-pay | Admitting: Orthopaedic Surgery

## 2021-11-17 ENCOUNTER — Ambulatory Visit (INDEPENDENT_AMBULATORY_CARE_PROVIDER_SITE_OTHER): Payer: Medicare Other | Admitting: Orthopaedic Surgery

## 2021-11-17 DIAGNOSIS — M25562 Pain in left knee: Secondary | ICD-10-CM

## 2021-11-17 DIAGNOSIS — E1159 Type 2 diabetes mellitus with other circulatory complications: Secondary | ICD-10-CM

## 2021-11-17 DIAGNOSIS — M25561 Pain in right knee: Secondary | ICD-10-CM | POA: Diagnosis not present

## 2021-11-17 DIAGNOSIS — N1831 Chronic kidney disease, stage 3a: Secondary | ICD-10-CM

## 2021-11-17 DIAGNOSIS — G8929 Other chronic pain: Secondary | ICD-10-CM

## 2021-11-17 NOTE — Progress Notes (Signed)
PROCEDURE NOTE: ? ?The patient requests injections of the right knee , verbal consent was obtained. ? ?The right knee was prepped appropriately after time out was performed.  ? ?Sterile technique was observed and injection of 1 cc of DepoMedrol 40mg  with several cc's of plain xylocaine. Anesthesia was provided by ethyl chloride and a 20-gauge needle was used to inject the knee area. The injection was tolerated well.  A band aid dressing was applied. ? ?The patient was advised to apply ice later today and tomorrow to the injection sight as needed. ? ?PROCEDURE NOTE: ? ?The patient requests injections of the left knee , verbal consent was obtained. ? ?The left knee was prepped appropriately after time out was performed.  ? ?Sterile technique was observed and injection of 1 cc of DepoMedrol 40 mg with several cc's of plain xylocaine. Anesthesia was provided by ethyl chloride and a 20-gauge needle was used to inject the knee area. The injection was tolerated well.  A band aid dressing was applied. ? ?The patient was advised to apply ice later today and tomorrow to the injection sight as needed. ? ?Encounter Diagnoses  ?Name Primary?  ? Bilateral chronic knee pain Yes  ? Type 2 diabetes mellitus with other circulatory complications ([place])   ? Stage 3a chronic kidney disease ([place])   ? ?Return in six weeks. ? ?Call if any problem. ? ?Precautions discussed. ? ?Electronically Signed ?Sanjuana Kava, MD ?5/9/20232:14 PM ? ?

## 2021-12-03 ENCOUNTER — Telehealth: Payer: Self-pay | Admitting: Orthopaedic Surgery

## 2021-12-03 NOTE — Telephone Encounter (Signed)
Call received from [facility], [phone], requesting verbal orders to continue home visits: 1 time per week for 4 weeks Then every other week, 1 time per week  - relayed to [facility] that Dr Luna Glasgow is out of clinic until 12/15/21, and that I will send message to our clinic administrator.

## 2021-12-04 NOTE — Telephone Encounter (Signed)
FYI  I called gave verbal ok for orders.

## 2021-12-24 ENCOUNTER — Ambulatory Visit (INDEPENDENT_AMBULATORY_CARE_PROVIDER_SITE_OTHER): Payer: Medicare Other

## 2021-12-24 DIAGNOSIS — I442 Atrioventricular block, complete: Secondary | ICD-10-CM | POA: Diagnosis not present

## 2021-12-24 LAB — CUP PACEART REMOTE DEVICE CHECK
Battery Remaining Longevity: 92 mo
Battery Remaining Percentage: 80 %
Battery Voltage: 3.01 V
Brady Statistic AP VP Percent: 11 %
Brady Statistic AP VS Percent: 1 %
Brady Statistic AS VP Percent: 87 %
Brady Statistic AS VS Percent: 1 %
Brady Statistic RA Percent Paced: 2.3 %
Brady Statistic RV Percent Paced: 99 %
Date Time Interrogation Session: 20230615020018
Implantable Lead Implant Date: 20100715
Implantable Lead Implant Date: 20100715
Implantable Lead Location: 753859
Implantable Lead Location: 753860
Implantable Pulse Generator Implant Date: 20210318
Lead Channel Impedance Value: 310 Ohm
Lead Channel Impedance Value: 380 Ohm
Lead Channel Pacing Threshold Amplitude: 0.625 V
Lead Channel Pacing Threshold Pulse Width: 0.5 ms
Lead Channel Sensing Intrinsic Amplitude: 12 mV
Lead Channel Sensing Intrinsic Amplitude: 3.3 mV
Lead Channel Setting Pacing Amplitude: 0.875
Lead Channel Setting Pacing Amplitude: 2.5 V
Lead Channel Setting Pacing Pulse Width: 0.5 ms
Lead Channel Setting Sensing Sensitivity: 4 mV
Pulse Gen Model: 2272
Pulse Gen Serial Number: 3803848

## 2021-12-29 ENCOUNTER — Ambulatory Visit (INDEPENDENT_AMBULATORY_CARE_PROVIDER_SITE_OTHER): Payer: Medicare Other | Admitting: Orthopaedic Surgery

## 2021-12-29 ENCOUNTER — Encounter: Payer: Self-pay | Admitting: Orthopaedic Surgery

## 2021-12-29 DIAGNOSIS — N1831 Chronic kidney disease, stage 3a: Secondary | ICD-10-CM

## 2021-12-29 DIAGNOSIS — E1159 Type 2 diabetes mellitus with other circulatory complications: Secondary | ICD-10-CM

## 2021-12-29 DIAGNOSIS — G8929 Other chronic pain: Secondary | ICD-10-CM | POA: Diagnosis not present

## 2021-12-29 DIAGNOSIS — M25562 Pain in left knee: Secondary | ICD-10-CM

## 2021-12-29 DIAGNOSIS — M25561 Pain in right knee: Secondary | ICD-10-CM

## 2021-12-29 NOTE — Progress Notes (Signed)
PROCEDURE NOTE:  The patient requests injections of the right knee , verbal consent was obtained.  The right knee was prepped appropriately after time out was performed.   Sterile technique was observed and injection of 1 cc of DepoMedrol 40mg  with several cc's of plain xylocaine. Anesthesia was provided by ethyl chloride and a 20-gauge needle was used to inject the knee area. The injection was tolerated well.  A band aid dressing was applied.  The patient was advised to apply ice later today and tomorrow to the injection sight as needed.  PROCEDURE NOTE:  The patient requests injections of the left knee , verbal consent was obtained.  The left knee was prepped appropriately after time out was performed.   Sterile technique was observed and injection of 1 cc of DepoMedrol 40 mg with several cc's of plain xylocaine. Anesthesia was provided by ethyl chloride and a 20-gauge needle was used to inject the knee area. The injection was tolerated well.  A band aid dressing was applied.  The patient was advised to apply ice later today and tomorrow to the injection sight as needed.  Encounter Diagnoses  Name Primary?   Bilateral chronic knee pain Yes   Type 2 diabetes mellitus with other circulatory complications (HCC)    Stage 3a chronic kidney disease ([place])    Return in six weeks.  Call if any problem.  Precautions discussed.  Electronically Signed Sanjuana Kava, MD 6/20/202310:11 AM

## 2022-01-01 NOTE — Progress Notes (Signed)
Remote pacemaker transmission.   

## 2022-01-06 ENCOUNTER — Ambulatory Visit (INDEPENDENT_AMBULATORY_CARE_PROVIDER_SITE_OTHER): Payer: Medicare Other | Admitting: Internal Medicine

## 2022-01-06 ENCOUNTER — Encounter: Payer: Self-pay | Admitting: Internal Medicine

## 2022-01-06 VITALS — BP 100/60 | HR 73 | Ht 72.0 in | Wt 179.4 lb

## 2022-01-06 DIAGNOSIS — I442 Atrioventricular block, complete: Secondary | ICD-10-CM

## 2022-01-06 DIAGNOSIS — I5042 Chronic combined systolic (congestive) and diastolic (congestive) heart failure: Secondary | ICD-10-CM

## 2022-01-06 NOTE — Progress Notes (Signed)
HPI Mr. Sleeth returns today for followup of CHB and persistent atrial fib and flutter and diastolic CHF. He has a h/o HTN and persistent atypical atrial flutter. He has a h/o non-compliance and difficulty with his rides. He has not had syncope. He notes that he done well since his last visit. He has not had chest pain. He notes some leg swelling Allergies  Allergen Reactions   Other     "some kind of fluid pill I can't take"      Current Outpatient Medications  Medication Sig Dispense Refill   acetaminophen (TYLENOL) 325 MG tablet Take 2 tablets (650 mg total) by mouth every 4 (four) hours as needed for headache or mild pain. 30 tablet 2   apixaban (ELIQUIS) 2.5 MG TABS tablet Take 1 tablet (2.5 mg total) by mouth 2 (two) times daily. 60 tablet 11   carbamide peroxide (DEBROX) 6.5 % OTIC solution Place 5 drops into both ears 2 (two) times daily. 15 mL 0   docusate sodium (COLACE) 100 MG capsule Take 100 mg by mouth daily as needed for mild constipation.     furosemide (LASIX) 40 MG tablet Take 1 tablet (40 mg total) by mouth daily. 30 tablet 2   glimepiride (AMARYL) 2 MG tablet Take 1 tablet (2 mg total) by mouth every morning. 30 tablet 11   glipiZIDE (GLUCOTROL) 5 MG tablet Take 5 mg by mouth daily before breakfast.     glucose blood test strip 1 each by Other route 2 (two) times daily. Use as instructed 2 x daily. E11.65 EZ MAX 100 each 5   isosorbide-hydrALAZINE (BIDIL) 20-37.5 MG tablet Take 1 tablet by mouth 2 (two) times daily. 30 tablet 2   linagliptin (TRADJENTA) 5 MG TABS tablet Take 5 mg by mouth daily.     losartan (COZAAR) 100 MG tablet Take 100 mg by mouth daily.     losartan (COZAAR) 50 MG tablet Take 50 mg by mouth daily.     Menthol-Methyl Salicylate (MUSCLE RUB) 10-15 % CREA Apply 1 application topically 2 (two) times daily as needed for muscle pain. Both Knees 85 g 0   metoprolol succinate (TOPROL-XL) 25 MG 24 hr tablet Take 1 tablet (25 mg total) by mouth daily.  30 tablet 3   OLANZapine (ZYPREXA) 2.5 MG tablet Take 1 tablet (2.5 mg total) by mouth at bedtime as needed (Dementia /insomnia/restlessness/agitation). 30 tablet 2   simvastatin (ZOCOR) 20 MG tablet Take 20 mg by mouth at bedtime.     No current facility-administered medications for this visit.     Past Medical History:  Diagnosis Date   Atrial fibrillation and flutter (HCC)    Cardiomyopathy ([place])    CKD (chronic kidney disease) stage 3, GFR 30-59 ml/min (HCC)    Complete heart block (HCC)    Diabetes mellitus    Hyperlipidemia    Hypertension    Pacemaker    Sinus bradycardia     ROS:   All systems reviewed and negative except as noted in the HPI.   Past Surgical History:  Procedure Laterality Date   COLONOSCOPY  2010   Two 4 mm sessile hepatic flexure polyps, one slightly pedunculated 6 to 8 mm sessile descending colon polyp, small internal hemorrhoids. Simple adenomas.    colonscopy     HEMORRHOID SURGERY     PACEMAKER INSERTION  [address] jude   PPM GENERATOR CHANGEOUT N/A 09/27/2019   Procedure: PPM GENERATOR CHANGEOUT;  Surgeon:  Evans Lance, MD;  Location: [place] [facility];  Service: Cardiovascular;  Laterality: N/A;   UMBILICAL HERNIA REPAIR  04/2008     Family History  Problem Relation Age of Onset   Heart disease Father    Cancer Sister    Cancer Sister    Cancer Sister    ALS Sister    Colon cancer Neg Hx      Social History   Socioeconomic History   Marital status: Widowed    Spouse name: Not on file   Number of children: Not on file   Years of education: Not on file   Highest education level: Not on file  Occupational History   Occupation: Retired, Optometrist Tobacco  Tobacco Use   Smoking status: Never   Smokeless tobacco: Never  Vaping Use   Vaping Use: Never used  Substance and Sexual Activity   Alcohol use: No    Alcohol/week: 0.0 standard drinks of alcohol   Drug use: No   Sexual activity: Never    Birth control/protection:  Abstinence  Other Topics Concern   Not on file  Social History Narrative   Widowed   1 child   Walks daily   Social Determinants of Health   Financial Resource Strain: Not on file  Food Insecurity: Not on file  Transportation Needs: Not on file  Physical Activity: Not on file  Stress: Not on file  Social Connections: Not on file  Intimate Partner Violence: Not on file     BP 100/60   Pulse 73   Ht 6' (1.829 m)   Wt 179 lb 6.4 oz (81.4 kg)   SpO2 98%   BMI 24.33 kg/m   Physical Exam:  Well appearing NAD HEENT: Unremarkable Neck:  No JVD, no thyromegally Lymphatics:  No adenopathy Back:  No CVA tenderness Lungs:  Clear with no wheezes HEART:  Regular rate rhythm, no murmurs, no rubs, no clicks Abd:  soft, positive bowel sounds, no organomegally, no rebound, no guarding Ext:  2 plus pulses, trace edema, no cyanosis, no clubbing Skin:  No rashes no nodules Neuro:  CN II through XII intact, motor grossly intact  EKG - nsr with AV pacing and PVC's  DEVICE  Normal device function.  See PaceArt for details.   Assess/Plan:  1. CHB - he is asymptomatic, s/p PPM insertion. 2. PPM - his [employer] DDD PM is working normally. 3. HTN - his bp is controlled. 4. Atrial fib/flutter - his VR is well controlled. No change in his meds.   Carleene Overlie Jd Mccaster,MD

## 2022-01-06 NOTE — Patient Instructions (Signed)
Medication Instructions:  Your physician recommends that you continue on your current medications as directed. Please refer to the Current Medication list given to you today.  *If you need a refill on your cardiac medications before your next appointment, please call your pharmacy*   Lab Work: NONE   If you have labs (blood work) drawn today and your tests are completely normal, you will receive your results only by: MyChart Message (if you have MyChart) OR A paper copy in the mail If you have any lab test that is abnormal or we need to change your treatment, we will call you to review the results.   Testing/Procedures: NONE    Follow-Up: At [facility], you and your health needs are our priority.  As part of our continuing mission to provide you with exceptional heart care, we have created designated Provider Care Teams.  These Care Teams include your primary Cardiologist (physician) and Advanced Practice Providers (APPs -  Physician Assistants and Nurse Practitioners) who all work together to provide you with the care you need, when you need it.  We recommend signing up for the patient portal called "MyChart".  Sign up information is provided on this After Visit Summary.  MyChart is used to connect with patients for Virtual Visits (Telemedicine).  Patients are able to view lab/test results, encounter notes, upcoming appointments, etc.  Non-urgent messages can be sent to your provider as well.   To learn more about what you can do with MyChart, go to [email or web address].    Your next appointment:   1 year(s)  The format for your next appointment:   In Person  Provider:   Gregg Taylor, MD    Other Instructions Thank you for choosing [facility]!    Important Information About Sugar       

## 2022-02-09 ENCOUNTER — Ambulatory Visit (INDEPENDENT_AMBULATORY_CARE_PROVIDER_SITE_OTHER): Payer: Medicare Other | Admitting: Orthopaedic Surgery

## 2022-02-09 ENCOUNTER — Encounter: Payer: Self-pay | Admitting: Orthopaedic Surgery

## 2022-02-09 DIAGNOSIS — N1831 Chronic kidney disease, stage 3a: Secondary | ICD-10-CM

## 2022-02-09 DIAGNOSIS — M25562 Pain in left knee: Secondary | ICD-10-CM | POA: Diagnosis not present

## 2022-02-09 DIAGNOSIS — G8929 Other chronic pain: Secondary | ICD-10-CM | POA: Diagnosis not present

## 2022-02-09 DIAGNOSIS — E1159 Type 2 diabetes mellitus with other circulatory complications: Secondary | ICD-10-CM

## 2022-02-09 DIAGNOSIS — M25561 Pain in right knee: Secondary | ICD-10-CM

## 2022-02-09 NOTE — Progress Notes (Signed)
PROCEDURE NOTE:  The patient requests injections of the right knee , verbal consent was obtained.  The right knee was prepped appropriately after time out was performed.   Sterile technique was observed and injection of 1 cc of DepoMedrol 40mg  with several cc's of plain xylocaine. Anesthesia was provided by ethyl chloride and a 20-gauge needle was used to inject the knee area. The injection was tolerated well.  A band aid dressing was applied.  The patient was advised to apply ice later today and tomorrow to the injection sight as needed.  PROCEDURE NOTE:  The patient requests injections of the left knee , verbal consent was obtained.  The left knee was prepped appropriately after time out was performed.   Sterile technique was observed and injection of 1 cc of DepoMedrol 40 mg with several cc's of plain xylocaine. Anesthesia was provided by ethyl chloride and a 20-gauge needle was used to inject the knee area. The injection was tolerated well.  A band aid dressing was applied.  The patient was advised to apply ice later today and tomorrow to the injection sight as needed.  Encounter Diagnoses  Name Primary?   Bilateral chronic knee pain Yes   Type 2 diabetes mellitus with other circulatory complications (HCC)    Stage 3a chronic kidney disease ([place])    Return in six weeks.  Call if any problem.  Precautions discussed.  Electronically Signed Sanjuana Kava, MD 8/1/202310:20 AM

## 2022-03-23 ENCOUNTER — Ambulatory Visit: Payer: Medicare Other | Admitting: Orthopaedic Surgery

## 2022-03-23 ENCOUNTER — Encounter: Payer: Self-pay | Admitting: Orthopaedic Surgery

## 2022-03-23 DIAGNOSIS — M25561 Pain in right knee: Secondary | ICD-10-CM

## 2022-03-23 DIAGNOSIS — M25562 Pain in left knee: Secondary | ICD-10-CM

## 2022-03-23 DIAGNOSIS — G8929 Other chronic pain: Secondary | ICD-10-CM | POA: Diagnosis not present

## 2022-03-23 DIAGNOSIS — N1831 Chronic kidney disease, stage 3a: Secondary | ICD-10-CM

## 2022-03-23 MED ORDER — METHYLPREDNISOLONE ACETATE 40 MG/ML IJ SUSP
40.0000 mg | Freq: Once | INTRAMUSCULAR | Status: AC
  Administered 2022-03-23: 40 mg via INTRA_ARTICULAR

## 2022-03-23 NOTE — Progress Notes (Signed)
PROCEDURE NOTE:  The patient requests injections of the right knee , verbal consent was obtained.  The right knee was prepped appropriately after time out was performed.   Sterile technique was observed and injection of 1 cc of DepoMedrol 40mg  with several cc's of plain xylocaine. Anesthesia was provided by ethyl chloride and a 20-gauge needle was used to inject the knee area. The injection was tolerated well.  A band aid dressing was applied.  The patient was advised to apply ice later today and tomorrow to the injection sight as needed.  PROCEDURE NOTE:  The patient requests injections of the left knee , verbal consent was obtained.  The left knee was prepped appropriately after time out was performed.   Sterile technique was observed and injection of 1 cc of DepoMedrol 40 mg with several cc's of plain xylocaine. Anesthesia was provided by ethyl chloride and a 20-gauge needle was used to inject the knee area. The injection was tolerated well.  A band aid dressing was applied.  The patient was advised to apply ice later today and tomorrow to the injection sight as needed.  Encounter Diagnoses  Name Primary?   Bilateral chronic knee pain Yes   Stage 3a chronic kidney disease ([place])    Return in six weeks.  Call if any problem.  Precautions discussed.  Electronically Signed Sanjuana Kava, MD 9/12/202310:29 AM

## 2022-03-23 NOTE — Addendum Note (Signed)
Addended by: Obie Dredge A on: 03/23/2022 11:24 AM   Modules accepted: Orders

## 2022-03-24 ENCOUNTER — Ambulatory Visit: Payer: Medicare Other | Admitting: Orthopaedic Surgery

## 2022-03-25 ENCOUNTER — Ambulatory Visit (INDEPENDENT_AMBULATORY_CARE_PROVIDER_SITE_OTHER): Payer: Medicare Other

## 2022-03-25 DIAGNOSIS — I442 Atrioventricular block, complete: Secondary | ICD-10-CM

## 2022-03-25 LAB — CUP PACEART REMOTE DEVICE CHECK
Battery Remaining Longevity: 86 mo
Battery Remaining Percentage: 77 %
Battery Voltage: 3.01 V
Brady Statistic AP VP Percent: 14 %
Brady Statistic AP VS Percent: 1 %
Brady Statistic AS VP Percent: 84 %
Brady Statistic AS VS Percent: 1 %
Brady Statistic RA Percent Paced: 13 %
Brady Statistic RV Percent Paced: 99 %
Date Time Interrogation Session: 20230914020012
Implantable Lead Implant Date: 20100715
Implantable Lead Implant Date: 20100715
Implantable Lead Location: 753859
Implantable Lead Location: 753860
Implantable Pulse Generator Implant Date: 20210318
Lead Channel Impedance Value: 340 Ohm
Lead Channel Impedance Value: 350 Ohm
Lead Channel Pacing Threshold Amplitude: 0.625 V
Lead Channel Pacing Threshold Amplitude: 0.75 V
Lead Channel Pacing Threshold Pulse Width: 0.5 ms
Lead Channel Pacing Threshold Pulse Width: 0.5 ms
Lead Channel Sensing Intrinsic Amplitude: 1.5 mV
Lead Channel Sensing Intrinsic Amplitude: 10.1 mV
Lead Channel Setting Pacing Amplitude: 0.875
Lead Channel Setting Pacing Amplitude: 2.5 V
Lead Channel Setting Pacing Pulse Width: 0.5 ms
Lead Channel Setting Sensing Sensitivity: 4 mV
Pulse Gen Model: 2272
Pulse Gen Serial Number: 3803848

## 2022-04-07 NOTE — Progress Notes (Signed)
Remote pacemaker transmission.   

## 2022-05-04 ENCOUNTER — Ambulatory Visit (INDEPENDENT_AMBULATORY_CARE_PROVIDER_SITE_OTHER): Payer: Medicare Other | Admitting: Orthopaedic Surgery

## 2022-05-04 ENCOUNTER — Encounter: Payer: Self-pay | Admitting: Orthopaedic Surgery

## 2022-05-04 DIAGNOSIS — M25562 Pain in left knee: Secondary | ICD-10-CM | POA: Diagnosis not present

## 2022-05-04 DIAGNOSIS — N1831 Chronic kidney disease, stage 3a: Secondary | ICD-10-CM

## 2022-05-04 DIAGNOSIS — G8929 Other chronic pain: Secondary | ICD-10-CM | POA: Diagnosis not present

## 2022-05-04 DIAGNOSIS — M25561 Pain in right knee: Secondary | ICD-10-CM

## 2022-05-04 DIAGNOSIS — E1159 Type 2 diabetes mellitus with other circulatory complications: Secondary | ICD-10-CM

## 2022-05-04 MED ORDER — METHYLPREDNISOLONE ACETATE 40 MG/ML IJ SUSP
40.0000 mg | Freq: Once | INTRAMUSCULAR | Status: AC
  Administered 2022-05-04: 40 mg via INTRA_ARTICULAR

## 2022-05-04 NOTE — Progress Notes (Signed)
PROCEDURE NOTE:  The patient requests injections of the right knee , verbal consent was obtained.  The right knee was prepped appropriately after time out was performed.   Sterile technique was observed and injection of 1 cc of DepoMedrol 40mg  with several cc's of plain xylocaine. Anesthesia was provided by ethyl chloride and a 20-gauge needle was used to inject the knee area. The injection was tolerated well.  A band aid dressing was applied.  The patient was advised to apply ice later today and tomorrow to the injection sight as needed.  PROCEDURE NOTE:  The patient requests injections of the left knee , verbal consent was obtained.  The left knee was prepped appropriately after time out was performed.   Sterile technique was observed and injection of 1 cc of DepoMedrol 40 mg with several cc's of plain xylocaine. Anesthesia was provided by ethyl chloride and a 20-gauge needle was used to inject the knee area. The injection was tolerated well.  A band aid dressing was applied.  The patient was advised to apply ice later today and tomorrow to the injection sight as needed.  Encounter Diagnoses  Name Primary?   Bilateral chronic knee pain Yes   Stage 3a chronic kidney disease (HCC)    Type 2 diabetes mellitus with other circulatory complications ([place])    Return in six weeks.  Call if any problem.  Precautions discussed.  Electronically Signed Sanjuana Kava, MD 10/24/20239:29 AM

## 2022-05-04 NOTE — Addendum Note (Signed)
Addended by: Obie Dredge A on: 05/04/2022 09:33 AM   Modules accepted: Orders

## 2022-06-15 ENCOUNTER — Encounter: Payer: Self-pay | Admitting: Orthopaedic Surgery

## 2022-06-15 ENCOUNTER — Ambulatory Visit (INDEPENDENT_AMBULATORY_CARE_PROVIDER_SITE_OTHER): Payer: Medicare Other | Admitting: Orthopaedic Surgery

## 2022-06-15 VITALS — BP 134/64 | HR 68 | Ht 72.0 in

## 2022-06-15 DIAGNOSIS — G8929 Other chronic pain: Secondary | ICD-10-CM

## 2022-06-15 DIAGNOSIS — E1159 Type 2 diabetes mellitus with other circulatory complications: Secondary | ICD-10-CM

## 2022-06-15 DIAGNOSIS — M25561 Pain in right knee: Secondary | ICD-10-CM

## 2022-06-15 DIAGNOSIS — N1831 Chronic kidney disease, stage 3a: Secondary | ICD-10-CM

## 2022-06-15 DIAGNOSIS — M25562 Pain in left knee: Secondary | ICD-10-CM

## 2022-06-15 MED ORDER — METHYLPREDNISOLONE ACETATE 40 MG/ML IJ SUSP
40.0000 mg | Freq: Once | INTRAMUSCULAR | Status: AC
  Administered 2022-06-15: 40 mg via INTRA_ARTICULAR

## 2022-06-15 NOTE — Progress Notes (Signed)
PROCEDURE NOTE:  The patient requests injections of the right knee , verbal consent was obtained.  The right knee was prepped appropriately after time out was performed.   Sterile technique was observed and injection of 1 cc of DepoMedrol 40mg  with several cc's of plain xylocaine. Anesthesia was provided by ethyl chloride and a 20-gauge needle was used to inject the knee area. The injection was tolerated well.  A band aid dressing was applied.  The patient was advised to apply ice later today and tomorrow to the injection sight as needed.  PROCEDURE NOTE:  The patient requests injections of the left knee , verbal consent was obtained.  The left knee was prepped appropriately after time out was performed.   Sterile technique was observed and injection of 1 cc of DepoMedrol 40 mg with several cc's of plain xylocaine. Anesthesia was provided by ethyl chloride and a 20-gauge needle was used to inject the knee area. The injection was tolerated well.  A band aid dressing was applied.  The patient was advised to apply ice later today and tomorrow to the injection sight as needed.  Encounter Diagnoses  Name Primary?   Bilateral chronic knee pain Yes   Stage 3a chronic kidney disease (HCC)    Type 2 diabetes mellitus with other circulatory complications ([place])    Return in six weeks.  Call if any problem.  Precautions discussed.  Electronically Signed Sanjuana Kava, MD 12/5/20239:32 AM

## 2022-06-15 NOTE — Addendum Note (Signed)
Addended by: Obie Dredge A on: 06/15/2022 10:13 AM   Modules accepted: Orders

## 2022-06-24 ENCOUNTER — Ambulatory Visit (INDEPENDENT_AMBULATORY_CARE_PROVIDER_SITE_OTHER): Payer: Medicare Other

## 2022-06-24 DIAGNOSIS — I442 Atrioventricular block, complete: Secondary | ICD-10-CM

## 2022-06-24 LAB — CUP PACEART REMOTE DEVICE CHECK
Battery Remaining Longevity: 83 mo
Battery Remaining Percentage: 74 %
Battery Voltage: 3.01 V
Brady Statistic AP VP Percent: 13 %
Brady Statistic AP VS Percent: 1 %
Brady Statistic AS VP Percent: 85 %
Brady Statistic AS VS Percent: 1 %
Brady Statistic RA Percent Paced: 12 %
Brady Statistic RV Percent Paced: 99 %
Date Time Interrogation Session: 20231214020033
Implantable Lead Connection Status: 753985
Implantable Lead Connection Status: 753985
Implantable Lead Implant Date: 20100715
Implantable Lead Implant Date: 20100715
Implantable Lead Location: 753859
Implantable Lead Location: 753860
Implantable Pulse Generator Implant Date: 20210318
Lead Channel Impedance Value: 310 Ohm
Lead Channel Impedance Value: 380 Ohm
Lead Channel Pacing Threshold Amplitude: 0.625 V
Lead Channel Pacing Threshold Amplitude: 0.75 V
Lead Channel Pacing Threshold Pulse Width: 0.5 ms
Lead Channel Pacing Threshold Pulse Width: 0.5 ms
Lead Channel Sensing Intrinsic Amplitude: 12 mV
Lead Channel Sensing Intrinsic Amplitude: 3.5 mV
Lead Channel Setting Pacing Amplitude: 0.875
Lead Channel Setting Pacing Amplitude: 2.5 V
Lead Channel Setting Pacing Pulse Width: 0.5 ms
Lead Channel Setting Sensing Sensitivity: 4 mV
Pulse Gen Model: 2272
Pulse Gen Serial Number: 3803848

## 2022-07-16 NOTE — Progress Notes (Signed)
Remote pacemaker transmission.   

## 2022-07-27 ENCOUNTER — Encounter: Payer: Self-pay | Admitting: Orthopaedic Surgery

## 2022-07-27 ENCOUNTER — Ambulatory Visit (INDEPENDENT_AMBULATORY_CARE_PROVIDER_SITE_OTHER): Payer: Medicare Other | Admitting: Orthopaedic Surgery

## 2022-07-27 DIAGNOSIS — E1159 Type 2 diabetes mellitus with other circulatory complications: Secondary | ICD-10-CM

## 2022-07-27 DIAGNOSIS — M25562 Pain in left knee: Secondary | ICD-10-CM

## 2022-07-27 DIAGNOSIS — G8929 Other chronic pain: Secondary | ICD-10-CM

## 2022-07-27 DIAGNOSIS — M25561 Pain in right knee: Secondary | ICD-10-CM | POA: Diagnosis not present

## 2022-07-27 DIAGNOSIS — N1831 Chronic kidney disease, stage 3a: Secondary | ICD-10-CM

## 2022-07-27 MED ORDER — METHYLPREDNISOLONE ACETATE 40 MG/ML IJ SUSP
40.0000 mg | Freq: Once | INTRAMUSCULAR | Status: AC
  Administered 2022-07-27: 40 mg via INTRA_ARTICULAR

## 2022-07-27 NOTE — Addendum Note (Signed)
Addended by: Obie Dredge A on: 07/27/2022 02:49 PM   Modules accepted: Orders

## 2022-07-27 NOTE — Progress Notes (Signed)
PROCEDURE NOTE:  The patient requests injections of the right knee , verbal consent was obtained.  The right knee was prepped appropriately after time out was performed.   Sterile technique was observed and injection of 1 cc of DepoMedrol 40mg  with several cc's of plain xylocaine. Anesthesia was provided by ethyl chloride and a 20-gauge needle was used to inject the knee area. The injection was tolerated well.  A band aid dressing was applied.  The patient was advised to apply ice later today and tomorrow to the injection sight as needed.  PROCEDURE NOTE:  The patient requests injections of the left knee , verbal consent was obtained.  The left knee was prepped appropriately after time out was performed.   Sterile technique was observed and injection of 1 cc of DepoMedrol 40 mg with several cc's of plain xylocaine. Anesthesia was provided by ethyl chloride and a 20-gauge needle was used to inject the knee area. The injection was tolerated well.  A band aid dressing was applied.  The patient was advised to apply ice later today and tomorrow to the injection sight as needed.  Encounter Diagnoses  Name Primary?   Bilateral chronic knee pain Yes   Stage 3a chronic kidney disease (HCC)    Type 2 diabetes mellitus with other circulatory complications ([place])    Return in two months.  Call if any problem.  Precautions discussed.  Electronically Signed Sanjuana Kava, MD 1/16/20249:09 AM

## 2022-09-18 ENCOUNTER — Other Ambulatory Visit: Payer: Self-pay

## 2022-09-18 ENCOUNTER — Inpatient Hospital Stay (HOSPITAL_COMMUNITY)
Admission: EM | Admit: 2022-09-18 | Discharge: 2022-09-24 | DRG: 280 | Disposition: A | Discharge status: Skilled Nursing Facility | Payer: Medicare Other | Attending: Family Medicine | Admitting: Family Medicine

## 2022-09-18 DIAGNOSIS — I4892 Unspecified atrial flutter: Secondary | ICD-10-CM | POA: Diagnosis present

## 2022-09-18 DIAGNOSIS — R7989 Other specified abnormal findings of blood chemistry: Secondary | ICD-10-CM

## 2022-09-18 DIAGNOSIS — Z751 Person awaiting admission to adequate facility elsewhere: Secondary | ICD-10-CM

## 2022-09-18 DIAGNOSIS — R509 Fever, unspecified: Principal | ICD-10-CM

## 2022-09-18 DIAGNOSIS — E1159 Type 2 diabetes mellitus with other circulatory complications: Secondary | ICD-10-CM | POA: Diagnosis present

## 2022-09-18 DIAGNOSIS — J189 Pneumonia, unspecified organism: Secondary | ICD-10-CM | POA: Diagnosis present

## 2022-09-18 DIAGNOSIS — Z950 Presence of cardiac pacemaker: Secondary | ICD-10-CM

## 2022-09-18 DIAGNOSIS — K5900 Constipation, unspecified: Secondary | ICD-10-CM

## 2022-09-18 DIAGNOSIS — Z809 Family history of malignant neoplasm, unspecified: Secondary | ICD-10-CM

## 2022-09-18 DIAGNOSIS — Z7984 Long term (current) use of oral hypoglycemic drugs: Secondary | ICD-10-CM

## 2022-09-18 DIAGNOSIS — E785 Hyperlipidemia, unspecified: Secondary | ICD-10-CM | POA: Diagnosis present

## 2022-09-18 DIAGNOSIS — I130 Hypertensive heart and chronic kidney disease with heart failure and stage 1 through stage 4 chronic kidney disease, or unspecified chronic kidney disease: Principal | ICD-10-CM | POA: Diagnosis present

## 2022-09-18 DIAGNOSIS — R5381 Other malaise: Secondary | ICD-10-CM

## 2022-09-18 DIAGNOSIS — Z888 Allergy status to other drugs, medicaments and biological substances status: Secondary | ICD-10-CM

## 2022-09-18 DIAGNOSIS — F0390 Unspecified dementia without behavioral disturbance: Secondary | ICD-10-CM | POA: Diagnosis present

## 2022-09-18 DIAGNOSIS — I5043 Acute on chronic combined systolic (congestive) and diastolic (congestive) heart failure: Secondary | ICD-10-CM | POA: Diagnosis present

## 2022-09-18 DIAGNOSIS — Z79899 Other long term (current) drug therapy: Secondary | ICD-10-CM

## 2022-09-18 DIAGNOSIS — R339 Retention of urine, unspecified: Secondary | ICD-10-CM | POA: Diagnosis not present

## 2022-09-18 DIAGNOSIS — I214 Non-ST elevation (NSTEMI) myocardial infarction: Secondary | ICD-10-CM | POA: Diagnosis present

## 2022-09-18 DIAGNOSIS — Z7901 Long term (current) use of anticoagulants: Secondary | ICD-10-CM

## 2022-09-18 DIAGNOSIS — Z1152 Encounter for screening for COVID-19: Secondary | ICD-10-CM

## 2022-09-18 DIAGNOSIS — I4821 Permanent atrial fibrillation: Secondary | ICD-10-CM | POA: Diagnosis present

## 2022-09-18 DIAGNOSIS — R110 Nausea: Secondary | ICD-10-CM

## 2022-09-18 DIAGNOSIS — I10 Essential (primary) hypertension: Secondary | ICD-10-CM | POA: Diagnosis present

## 2022-09-18 DIAGNOSIS — E1122 Type 2 diabetes mellitus with diabetic chronic kidney disease: Secondary | ICD-10-CM | POA: Diagnosis present

## 2022-09-18 DIAGNOSIS — I429 Cardiomyopathy, unspecified: Secondary | ICD-10-CM | POA: Diagnosis present

## 2022-09-18 DIAGNOSIS — N184 Chronic kidney disease, stage 4 (severe): Secondary | ICD-10-CM | POA: Diagnosis present

## 2022-09-18 DIAGNOSIS — M545 Low back pain, unspecified: Secondary | ICD-10-CM

## 2022-09-18 DIAGNOSIS — E1165 Type 2 diabetes mellitus with hyperglycemia: Secondary | ICD-10-CM | POA: Diagnosis present

## 2022-09-18 DIAGNOSIS — Z820 Family history of epilepsy and other diseases of the nervous system: Secondary | ICD-10-CM

## 2022-09-18 DIAGNOSIS — G8929 Other chronic pain: Secondary | ICD-10-CM

## 2022-09-18 DIAGNOSIS — Z8249 Family history of ischemic heart disease and other diseases of the circulatory system: Secondary | ICD-10-CM

## 2022-09-18 DIAGNOSIS — E782 Mixed hyperlipidemia: Secondary | ICD-10-CM | POA: Diagnosis present

## 2022-09-18 NOTE — ED Triage Notes (Signed)
Pt BIB [facility], EMS states they were called out for chronic back and knee pain. Pt was found to have a fever of 101.5. Pt does not have any other complaints at this time.    Rectal temp of 100.7 in triage.

## 2022-09-19 ENCOUNTER — Inpatient Hospital Stay (HOSPITAL_COMMUNITY): Payer: Medicare Other

## 2022-09-19 ENCOUNTER — Emergency Department (HOSPITAL_COMMUNITY): Payer: Medicare Other

## 2022-09-19 ENCOUNTER — Other Ambulatory Visit (HOSPITAL_COMMUNITY): Payer: Self-pay | Admitting: *Deleted

## 2022-09-19 DIAGNOSIS — I5043 Acute on chronic combined systolic (congestive) and diastolic (congestive) heart failure: Secondary | ICD-10-CM | POA: Diagnosis present

## 2022-09-19 DIAGNOSIS — Z8249 Family history of ischemic heart disease and other diseases of the circulatory system: Secondary | ICD-10-CM | POA: Diagnosis not present

## 2022-09-19 DIAGNOSIS — R7989 Other specified abnormal findings of blood chemistry: Secondary | ICD-10-CM

## 2022-09-19 DIAGNOSIS — I214 Non-ST elevation (NSTEMI) myocardial infarction: Secondary | ICD-10-CM | POA: Diagnosis not present

## 2022-09-19 DIAGNOSIS — E782 Mixed hyperlipidemia: Secondary | ICD-10-CM | POA: Diagnosis present

## 2022-09-19 DIAGNOSIS — Z1152 Encounter for screening for COVID-19: Secondary | ICD-10-CM | POA: Diagnosis not present

## 2022-09-19 DIAGNOSIS — Z809 Family history of malignant neoplasm, unspecified: Secondary | ICD-10-CM | POA: Diagnosis not present

## 2022-09-19 DIAGNOSIS — I429 Cardiomyopathy, unspecified: Secondary | ICD-10-CM | POA: Diagnosis present

## 2022-09-19 DIAGNOSIS — I5A Non-ischemic myocardial injury (non-traumatic): Secondary | ICD-10-CM | POA: Diagnosis not present

## 2022-09-19 DIAGNOSIS — I4821 Permanent atrial fibrillation: Secondary | ICD-10-CM | POA: Diagnosis present

## 2022-09-19 DIAGNOSIS — I5021 Acute systolic (congestive) heart failure: Secondary | ICD-10-CM | POA: Diagnosis not present

## 2022-09-19 DIAGNOSIS — R509 Fever, unspecified: Secondary | ICD-10-CM | POA: Diagnosis not present

## 2022-09-19 DIAGNOSIS — F0390 Unspecified dementia without behavioral disturbance: Secondary | ICD-10-CM | POA: Diagnosis present

## 2022-09-19 DIAGNOSIS — Z7901 Long term (current) use of anticoagulants: Secondary | ICD-10-CM | POA: Diagnosis not present

## 2022-09-19 DIAGNOSIS — Z515 Encounter for palliative care: Secondary | ICD-10-CM | POA: Diagnosis not present

## 2022-09-19 DIAGNOSIS — E785 Hyperlipidemia, unspecified: Secondary | ICD-10-CM | POA: Diagnosis not present

## 2022-09-19 DIAGNOSIS — R339 Retention of urine, unspecified: Secondary | ICD-10-CM | POA: Diagnosis present

## 2022-09-19 DIAGNOSIS — Z79899 Other long term (current) drug therapy: Secondary | ICD-10-CM | POA: Diagnosis not present

## 2022-09-19 DIAGNOSIS — E1122 Type 2 diabetes mellitus with diabetic chronic kidney disease: Secondary | ICD-10-CM | POA: Diagnosis present

## 2022-09-19 DIAGNOSIS — Z950 Presence of cardiac pacemaker: Secondary | ICD-10-CM | POA: Diagnosis not present

## 2022-09-19 DIAGNOSIS — E1159 Type 2 diabetes mellitus with other circulatory complications: Secondary | ICD-10-CM | POA: Diagnosis present

## 2022-09-19 DIAGNOSIS — J189 Pneumonia, unspecified organism: Secondary | ICD-10-CM | POA: Diagnosis present

## 2022-09-19 DIAGNOSIS — Z751 Person awaiting admission to adequate facility elsewhere: Secondary | ICD-10-CM | POA: Diagnosis not present

## 2022-09-19 DIAGNOSIS — Z7189 Other specified counseling: Secondary | ICD-10-CM | POA: Diagnosis not present

## 2022-09-19 DIAGNOSIS — M545 Low back pain, unspecified: Secondary | ICD-10-CM | POA: Diagnosis present

## 2022-09-19 DIAGNOSIS — I484 Atypical atrial flutter: Secondary | ICD-10-CM | POA: Diagnosis not present

## 2022-09-19 DIAGNOSIS — N184 Chronic kidney disease, stage 4 (severe): Secondary | ICD-10-CM | POA: Diagnosis present

## 2022-09-19 DIAGNOSIS — I130 Hypertensive heart and chronic kidney disease with heart failure and stage 1 through stage 4 chronic kidney disease, or unspecified chronic kidney disease: Secondary | ICD-10-CM | POA: Diagnosis present

## 2022-09-19 DIAGNOSIS — Z888 Allergy status to other drugs, medicaments and biological substances status: Secondary | ICD-10-CM | POA: Diagnosis not present

## 2022-09-19 DIAGNOSIS — E1165 Type 2 diabetes mellitus with hyperglycemia: Secondary | ICD-10-CM | POA: Diagnosis present

## 2022-09-19 DIAGNOSIS — Z7984 Long term (current) use of oral hypoglycemic drugs: Secondary | ICD-10-CM | POA: Diagnosis not present

## 2022-09-19 DIAGNOSIS — K5900 Constipation, unspecified: Secondary | ICD-10-CM | POA: Diagnosis not present

## 2022-09-19 DIAGNOSIS — I4892 Unspecified atrial flutter: Secondary | ICD-10-CM | POA: Diagnosis present

## 2022-09-19 DIAGNOSIS — Z820 Family history of epilepsy and other diseases of the nervous system: Secondary | ICD-10-CM | POA: Diagnosis not present

## 2022-09-19 LAB — RESP PANEL BY RT-PCR (RSV, FLU A&B, COVID)  RVPGX2
Influenza A by PCR: NEGATIVE
Influenza B by PCR: NEGATIVE
Resp Syncytial Virus by PCR: NEGATIVE
SARS Coronavirus 2 by RT PCR: NEGATIVE

## 2022-09-19 LAB — HEPARIN LEVEL (UNFRACTIONATED): Heparin Unfractionated: >1.1 IU/mL — ABNORMAL HIGH (ref 0.30–0.70)

## 2022-09-19 LAB — GLUCOSE, CAPILLARY
Glucose-Capillary: 133 mg/dL — ABNORMAL HIGH (ref 70–99)
Glucose-Capillary: 150 mg/dL — ABNORMAL HIGH (ref 70–99)
Glucose-Capillary: 153 mg/dL — ABNORMAL HIGH (ref 70–99)

## 2022-09-19 LAB — URINALYSIS, W/ REFLEX TO CULTURE (INFECTION SUSPECTED)
Bacteria, UA: NONE SEEN
Bilirubin Urine: NEGATIVE
Glucose, UA: NEGATIVE mg/dL
Hgb urine dipstick: NEGATIVE
Ketones, ur: 5 mg/dL — AB
Leukocytes,Ua: NEGATIVE
Nitrite: NEGATIVE
Protein, ur: NEGATIVE mg/dL
Specific Gravity, Urine: 1.012 (ref 1.005–1.030)
pH: 5 (ref 5.0–8.0)

## 2022-09-19 LAB — CBC WITH DIFFERENTIAL/PLATELET
Abs Immature Granulocytes: 0.04 10*3/uL (ref 0.00–0.07)
Basophils Absolute: 0 10*3/uL (ref 0.0–0.1)
Basophils Relative: 0 %
Eosinophils Absolute: 0 10*3/uL (ref 0.0–0.5)
Eosinophils Relative: 0 %
HCT: 31.3 % — ABNORMAL LOW (ref 39.0–52.0)
Hemoglobin: 9.7 g/dL — ABNORMAL LOW (ref 13.0–17.0)
Immature Granulocytes: 1 %
Lymphocytes Relative: 20 %
Lymphs Abs: 1.6 10*3/uL (ref 0.7–4.0)
MCH: 25.3 pg — ABNORMAL LOW (ref 26.0–34.0)
MCHC: 31 g/dL (ref 30.0–36.0)
MCV: 81.7 fL (ref 80.0–100.0)
Monocytes Absolute: 0.6 10*3/uL (ref 0.1–1.0)
Monocytes Relative: 8 %
Neutro Abs: 5.7 10*3/uL (ref 1.7–7.7)
Neutrophils Relative %: 71 %
Platelets: 185 10*3/uL (ref 150–400)
RBC: 3.83 MIL/uL — ABNORMAL LOW (ref 4.22–5.81)
RDW: 16.1 % — ABNORMAL HIGH (ref 11.5–15.5)
WBC: 8 10*3/uL (ref 4.0–10.5)
nRBC: 0 % (ref 0.0–0.2)

## 2022-09-19 LAB — COMPREHENSIVE METABOLIC PANEL
ALT: 14 U/L (ref 0–44)
AST: 25 U/L (ref 15–41)
Albumin: 3.4 g/dL — ABNORMAL LOW (ref 3.5–5.0)
Alkaline Phosphatase: 45 U/L (ref 38–126)
Anion gap: 12 (ref 5–15)
BUN: 40 mg/dL — ABNORMAL HIGH (ref 8–23)
CO2: 19 mmol/L — ABNORMAL LOW (ref 22–32)
Calcium: 9.2 mg/dL (ref 8.9–10.3)
Chloride: 107 mmol/L (ref 98–111)
Creatinine, Ser: 2.13 mg/dL — ABNORMAL HIGH (ref 0.61–1.24)
GFR, Estimated: 29 mL/min — ABNORMAL LOW (ref >60)
Glucose, Bld: 178 mg/dL — ABNORMAL HIGH (ref 70–99)
Potassium: 3.9 mmol/L (ref 3.5–5.1)
Sodium: 138 mmol/L (ref 135–145)
Total Bilirubin: 1.2 mg/dL (ref 0.3–1.2)
Total Protein: 7.4 g/dL (ref 6.5–8.1)

## 2022-09-19 LAB — ECHOCARDIOGRAM COMPLETE
AR max vel: 1.76 cm2
AV Area VTI: 1.56 cm2
AV Area mean vel: 1.72 cm2
AV Mean grad: 5 mmHg
AV Peak grad: 10.2 mmHg
Ao pk vel: 1.6 m/s
Area-P 1/2: 4.49 cm2
Height: 72 in
P 1/2 time: 522 msec
S' Lateral: 3.6 cm
Single Plane A2C EF: 38 %
Weight: 2857.16 oz

## 2022-09-19 LAB — CBG MONITORING, ED: Glucose-Capillary: 189 mg/dL — ABNORMAL HIGH (ref 70–99)

## 2022-09-19 LAB — TROPONIN I (HIGH SENSITIVITY)
Troponin I (High Sensitivity): 140 ng/L (ref <18)
Troponin I (High Sensitivity): 203 ng/L (ref <18)
Troponin I (High Sensitivity): 328 ng/L (ref <18)
Troponin I (High Sensitivity): 289 ng/L (ref <18)

## 2022-09-19 LAB — LIPASE, BLOOD: Lipase: 27 U/L (ref 11–51)

## 2022-09-19 LAB — APTT
aPTT: 47 seconds — ABNORMAL HIGH (ref 24–36)
aPTT: 35 seconds (ref 24–36)

## 2022-09-19 LAB — LACTIC ACID, PLASMA
Lactic Acid, Venous: 1.6 mmol/L (ref 0.5–1.9)
Lactic Acid, Venous: 1.6 mmol/L (ref 0.5–1.9)

## 2022-09-19 LAB — PROTIME-INR
INR: 1.8 — ABNORMAL HIGH (ref 0.8–1.2)
Prothrombin Time: 20.3 seconds — ABNORMAL HIGH (ref 11.4–15.2)

## 2022-09-19 LAB — PROCALCITONIN: Procalcitonin: 0.46 ng/mL

## 2022-09-19 MED ORDER — INSULIN ASPART 100 UNIT/ML IJ SOLN
0.0000 [IU] | Freq: Three times a day (TID) | INTRAMUSCULAR | Status: DC
  Administered 2022-09-19: 3 [IU] via SUBCUTANEOUS
  Administered 2022-09-19: 2 [IU] via SUBCUTANEOUS
  Administered 2022-09-20 – 2022-09-21 (×4): 3 [IU] via SUBCUTANEOUS
  Administered 2022-09-21: 5 [IU] via SUBCUTANEOUS
  Administered 2022-09-21: 3 [IU] via SUBCUTANEOUS
  Administered 2022-09-22 (×2): 5 [IU] via SUBCUTANEOUS
  Administered 2022-09-22: 2 [IU] via SUBCUTANEOUS
  Administered 2022-09-23: 3 [IU] via SUBCUTANEOUS
  Administered 2022-09-23 – 2022-09-24 (×3): 5 [IU] via SUBCUTANEOUS
  Administered 2022-09-24: 3 [IU] via SUBCUTANEOUS
  Filled 2022-09-19: qty 1

## 2022-09-19 MED ORDER — METOPROLOL SUCCINATE ER 25 MG PO TB24
25.0000 mg | ORAL_TABLET | Freq: Every day | ORAL | Status: DC
  Administered 2022-09-19 – 2022-09-24 (×6): 25 mg via ORAL
  Filled 2022-09-19 (×6): qty 1

## 2022-09-19 MED ORDER — SODIUM CHLORIDE 0.9 % IV SOLN
1.0000 g | INTRAVENOUS | Status: AC
  Administered 2022-09-20 – 2022-09-23 (×4): 1 g via INTRAVENOUS
  Filled 2022-09-19 (×4): qty 10

## 2022-09-19 MED ORDER — SODIUM CHLORIDE 0.9% FLUSH
3.0000 mL | Freq: Two times a day (BID) | INTRAVENOUS | Status: DC
  Administered 2022-09-19 – 2022-09-24 (×10): 3 mL via INTRAVENOUS

## 2022-09-19 MED ORDER — SODIUM CHLORIDE 0.9 % IV SOLN
1.0000 g | Freq: Once | INTRAVENOUS | Status: AC
  Administered 2022-09-19: 1 g via INTRAVENOUS
  Filled 2022-09-19: qty 10

## 2022-09-19 MED ORDER — FUROSEMIDE 10 MG/ML IJ SOLN
40.0000 mg | INTRAMUSCULAR | Status: AC
  Administered 2022-09-19: 40 mg via INTRAVENOUS
  Filled 2022-09-19: qty 4

## 2022-09-19 MED ORDER — ONDANSETRON HCL 4 MG/2ML IJ SOLN: 4.0000 mg | Freq: Four times a day (QID) | INTRAMUSCULAR | Status: DC | PRN

## 2022-09-19 MED ORDER — SIMVASTATIN 20 MG PO TABS
20.0000 mg | ORAL_TABLET | Freq: Every day | ORAL | Status: DC
  Administered 2022-09-19 – 2022-09-23 (×5): 20 mg via ORAL
  Filled 2022-09-19 (×5): qty 1

## 2022-09-19 MED ORDER — ONDANSETRON HCL 4 MG PO TABS: 4.0000 mg | ORAL_TABLET | Freq: Four times a day (QID) | ORAL | Status: DC | PRN

## 2022-09-19 MED ORDER — FUROSEMIDE 10 MG/ML IJ SOLN
40.0000 mg | Freq: Two times a day (BID) | INTRAMUSCULAR | Status: DC
  Administered 2022-09-19 – 2022-09-20 (×2): 40 mg via INTRAVENOUS
  Filled 2022-09-19 (×2): qty 4

## 2022-09-19 MED ORDER — ACETAMINOPHEN 325 MG PO TABS
650.0000 mg | ORAL_TABLET | Freq: Four times a day (QID) | ORAL | Status: DC | PRN
  Administered 2022-09-19 – 2022-09-23 (×6): 650 mg via ORAL
  Filled 2022-09-19 (×6): qty 2

## 2022-09-19 MED ORDER — CHLORHEXIDINE GLUCONATE CLOTH 2 % EX PADS
6.0000 | MEDICATED_PAD | Freq: Every day | CUTANEOUS | Status: DC
  Administered 2022-09-19 – 2022-09-24 (×6): 6 via TOPICAL

## 2022-09-19 MED ORDER — HEPARIN (PORCINE) 25000 UT/250ML-% IV SOLN
1150.0000 [IU]/h | INTRAVENOUS | Status: DC
  Administered 2022-09-19: 950 [IU]/h via INTRAVENOUS
  Administered 2022-09-20: 1150 [IU]/h via INTRAVENOUS
  Filled 2022-09-19 (×2): qty 250

## 2022-09-19 MED ORDER — SODIUM CHLORIDE 0.9 % IV SOLN: 250.0000 mL | INTRAVENOUS | Status: DC | PRN

## 2022-09-19 MED ORDER — INSULIN ASPART 100 UNIT/ML IJ SOLN
0.0000 [IU] | Freq: Every day | INTRAMUSCULAR | Status: DC
  Administered 2022-09-20 – 2022-09-23 (×4): 2 [IU] via SUBCUTANEOUS

## 2022-09-19 MED ORDER — ACETAMINOPHEN 650 MG RE SUPP: 650.0000 mg | Freq: Four times a day (QID) | RECTAL | Status: DC | PRN

## 2022-09-19 MED ORDER — SODIUM CHLORIDE 0.9 % IV SOLN
500.0000 mg | INTRAVENOUS | Status: AC
  Administered 2022-09-19 – 2022-09-23 (×5): 500 mg via INTRAVENOUS
  Filled 2022-09-19 (×5): qty 5

## 2022-09-19 MED ORDER — DOCUSATE SODIUM 100 MG PO CAPS
100.0000 mg | ORAL_CAPSULE | Freq: Every day | ORAL | Status: DC | PRN
  Administered 2022-09-20: 100 mg via ORAL
  Filled 2022-09-19: qty 1

## 2022-09-19 MED ORDER — ISOSORB DINITRATE-HYDRALAZINE 20-37.5 MG PO TABS
1.0000 | ORAL_TABLET | Freq: Two times a day (BID) | ORAL | Status: DC
  Administered 2022-09-19 – 2022-09-24 (×11): 1 via ORAL
  Filled 2022-09-19 (×21): qty 1

## 2022-09-19 MED ORDER — HALOPERIDOL LACTATE 5 MG/ML IJ SOLN: 2.0000 mg | Freq: Four times a day (QID) | INTRAMUSCULAR | Status: DC | PRN

## 2022-09-19 MED ORDER — SODIUM CHLORIDE 0.9% FLUSH: 3.0000 mL | INTRAVENOUS | Status: DC | PRN

## 2022-09-19 MED ORDER — OLANZAPINE 5 MG PO TABS
2.5000 mg | ORAL_TABLET | Freq: Every evening | ORAL | Status: DC | PRN
  Administered 2022-09-22: 2.5 mg via ORAL
  Filled 2022-09-19: qty 1

## 2022-09-19 NOTE — ED Notes (Signed)
Bladder scan showed 38ms in bladder. Foley catheter draining without difficulty. Patient changed into gown.Pending room assignment.

## 2022-09-19 NOTE — ED Notes (Signed)
Echo at bedside

## 2022-09-19 NOTE — ED Notes (Signed)
Patient transported to CT 

## 2022-09-19 NOTE — ED Notes (Signed)
Date and time results received: 09/19/22 1021 (use smartphrase ".now" to insert current time)  Test: troponin Critical Value: 328  Name of Provider Notified: Dr. Manuella Ghazi  Orders Received? Or Actions Taken?:  paging now

## 2022-09-19 NOTE — ED Provider Notes (Signed)
[facility] Emergency Department Provider Note MRN:  [id number]  Arrival date & time: 09/19/22     Chief Complaint   Back pain History of Present Illness   Kyle Schultz is a 89 y.o. year-old male with a history of A-fib, CKD, cardiomyopathy presenting to the ED with chief complaint of back pain.  Low back pain chronic in nature but a bit worse recently.  Also some recent constipation, abdominal bloating.  Arrives with fever.  Did not know he had a fever.  Denies chest pain or shortness of breath, no recent cough or cold-like symptoms.  Review of Systems  A thorough review of systems was obtained and all systems are negative except as noted in the HPI and PMH.   Patient's Health History    Past Medical History:  Diagnosis Date   Atrial fibrillation and flutter (HCC)    Cardiomyopathy ([place])    CKD (chronic kidney disease) stage 3, GFR 30-59 ml/min (HCC)    Complete heart block (HCC)    Diabetes mellitus    Hyperlipidemia    Hypertension    Pacemaker    Sinus bradycardia     Past Surgical History:  Procedure Laterality Date   COLONOSCOPY  2010   Two 4 mm sessile hepatic flexure polyps, one slightly pedunculated 6 to 8 mm sessile descending colon polyp, small internal hemorrhoids. Simple adenomas.    colonscopy     HEMORRHOID SURGERY     PACEMAKER INSERTION  [address] jude   PPM GENERATOR CHANGEOUT N/A 09/27/2019   Procedure: PPM GENERATOR CHANGEOUT;  Surgeon: Evans Lance, MD;  Location: [place] [facility];  Service: Cardiovascular;  Laterality: N/A;   UMBILICAL HERNIA REPAIR  04/2008    Family History  Problem Relation Age of Onset   Heart disease Father    Cancer Sister    Cancer Sister    Cancer Sister    ALS Sister    Colon cancer Neg Hx     Social History   Socioeconomic History   Marital status: Widowed    Spouse name: Not on file   Number of children: Not on file   Years of education: Not on file   Highest education level: Not  on file  Occupational History   Occupation: Retired, Optometrist Tobacco  Tobacco Use   Smoking status: Never   Smokeless tobacco: Never  Vaping Use   Vaping Use: Never used  Substance and Sexual Activity   Alcohol use: No    Alcohol/week: 0.0 standard drinks of alcohol   Drug use: No   Sexual activity: Never    Birth control/protection: Abstinence  Other Topics Concern   Not on file  Social History Narrative   Widowed   1 child   Walks daily   Social Determinants of Health   Financial Resource Strain: Not on file  Food Insecurity: Not on file  Transportation Needs: Not on file  Physical Activity: Not on file  Stress: Not on file  Social Connections: Not on file  Intimate Partner Violence: Not on file     Physical Exam   Vitals:   09/19/22 0330 09/19/22 0430  BP: (!) 153/92 (!) 155/60  Pulse: 65 60  Resp: 19 15  Temp:    SpO2: 96% 98%    CONSTITUTIONAL: Well-appearing, NAD NEURO/PSYCH:  Alert and oriented x 3, no focal deficits EYES:  eyes equal and reactive ENT/NECK:  no LAD, no JVD CARDIO: Regular rate, well-perfused, normal S1  and S2 PULM:  CTAB no wheezing or rhonchi GI/GU:  non-distended, non-tender MSK/SPINE:  No gross deformities, no edema SKIN:  no rash, atraumatic   *Additional and/or pertinent findings included in MDM below  Diagnostic and Interventional Summary    EKG Interpretation  Date/Time:  Sunday September 19 2022 01:14:26 EST Ventricular Rate:  62 PR Interval:  223 QRS Duration: 154 QT Interval:  440 QTC Calculation: 447 R Axis:   265 Text Interpretation: Sinus rhythm Prolonged PR interval Nonspecific IVCD with LAD Anterolateral infarct, old No significant change was found Confirmed by Gerlene Fee [phone]) on 09/19/2022 1:36:17 AM       Labs Reviewed  COMPREHENSIVE METABOLIC PANEL - Abnormal; Notable for the following components:      Result Value   CO2 19 (*)    Glucose, Bld 178 (*)    BUN 40 (*)    Creatinine, Ser 2.13 (*)     Albumin 3.4 (*)    GFR, Estimated 29 (*)    All other components within normal limits  CBC WITH DIFFERENTIAL/PLATELET - Abnormal; Notable for the following components:   RBC 3.83 (*)    Hemoglobin 9.7 (*)    HCT 31.3 (*)    MCH 25.3 (*)    RDW 16.1 (*)    All other components within normal limits  PROTIME-INR - Abnormal; Notable for the following components:   Prothrombin Time 20.3 (*)    INR 1.8 (*)    All other components within normal limits  APTT - Abnormal; Notable for the following components:   aPTT 47 (*)    All other components within normal limits  URINALYSIS, W/ REFLEX TO CULTURE (INFECTION SUSPECTED) - Abnormal; Notable for the following components:   Ketones, ur 5 (*)    All other components within normal limits  TROPONIN I (HIGH SENSITIVITY) - Abnormal; Notable for the following components:   Troponin I (High Sensitivity) 140 (*)    All other components within normal limits  TROPONIN I (HIGH SENSITIVITY) - Abnormal; Notable for the following components:   Troponin I (High Sensitivity) 203 (*)    All other components within normal limits  CULTURE, BLOOD (ROUTINE X 2)  CULTURE, BLOOD (ROUTINE X 2)  RESP PANEL BY RT-PCR (RSV, FLU A&B, COVID)  [id number]  LACTIC ACID, PLASMA  LACTIC ACID, PLASMA  LIPASE, BLOOD    CT ABDOMEN PELVIS WO CONTRAST  Final Result    CT HEAD WO CONTRAST (5MM)  Final Result    DG Chest Port 1 View  Final Result      Medications - No data to display   Procedures  /  Critical Care Procedures  ED Course and Medical Decision Making  Initial Impression and Ddx Patient arrives febrile, with low back pain is the major complaint.  Also with some abdominal bloating, constipation.  Differential diagnosis includes UTI, sepsis, intra-abdominal infection.  No signs or symptoms of myelopathy, overall doubt spinal epidural abscess.  Past medical/surgical history that increases complexity of ED encounter: A-fib, CHF  Interpretation of  Diagnostics I personally reviewed the EKG and my interpretation is as follows: Atrial flutter, bundle branch block  Labs reveal largely baseline levels with exception of troponin of 160 rising to greater than 200 on repeat.  Patient Reassessment and Ultimate Disposition/Management     Patient continues to deny any chest pain.  Vitals remain reassuring, unclear source of fever.  CT of the abdomen revealed a markedly dilated bladder and so Foley catheter was placed.  Nearly 1-1/2 L of urine was obtained.  Chest x-ray with evidence of pulmonary edema.  Given this and the elevated troponin and the continued on known source of fever will admit to medicine.  Patient management required discussion with the following services or consulting groups:  Hospitalist Service  Complexity of Problems Addressed Acute illness or injury that poses threat of life of bodily function  Additional Data Reviewed and Analyzed Further history obtained from: Further history from spouse/family member  Additional Factors Impacting ED Encounter Risk Consideration of hospitalization  Barth Kirks. Sedonia Small, [place] [email or web address]  Final Clinical Impressions(s) / ED Diagnoses     ICD-10-CM   1. Fever, unspecified fever cause  R50.9     2. Constipation, unspecified constipation type  K59.00     3. Chronic low back pain, unspecified back pain laterality, unspecified whether sciatica present  M54.50    G89.29     4. Elevated troponin  R79.89     5. Nausea  R11.0     6. Malaise  R53.81     7. Urinary retention  R33.9       ED Discharge Orders     None        Discharge Instructions Discussed with and Provided to Patient:   Discharge Instructions   None      Maudie Flakes, MD 09/19/22 [phone]

## 2022-09-19 NOTE — Progress Notes (Signed)
[place] for heparin Indication: chest pain/ACS  Allergies  Allergen Reactions   Other     "some kind of fluid pill I can't take"     Patient Measurements: Height: 6' (182.9 cm) Weight: 86.9 kg (191 lb 9.3 oz) IBW/kg (Calculated) : 77.6 Heparin Dosing Weight: 81 kg  Vital Signs: Temp: 97.7 F (36.5 C) (03/10 2000) Temp Source: Oral (03/10 2000) BP: 166/66 (03/10 2000) Pulse Rate: 62 (03/10 2000)  Labs: Recent Labs    09/19/22 0043 09/19/22 0344 09/19/22 0927 09/19/22 1131 09/19/22 1917  HGB 9.7*  --   --   --   --   HCT 31.3*  --   --   --   --   PLT 185  --   --   --   --   APTT 47*  --   --   --  35  LABPROT 20.3*  --   --   --   --   INR 1.8*  --   --   --   --   HEPARINUNFRC  --   --   --   --  >1.10*  CREATININE 2.13*  --   --   --   --   TROPONINIHS 140* 203* 328* 289*  --      Estimated Creatinine Clearance: 25.3 mL/min (A) (by C-G formula based on SCr of 2.13 mg/dL (H)).   Medical History: Past Medical History:  Diagnosis Date   Atrial fibrillation and flutter (HCC)    Cardiomyopathy ([place])    CKD (chronic kidney disease) stage 3, GFR 30-59 ml/min (HCC)    Complete heart block (HCC)    Diabetes mellitus    Hyperlipidemia    Hypertension    Pacemaker    Sinus bradycardia     Medications:  Medications Prior to Admission  Medication Sig Dispense Refill Last Dose   acetaminophen (TYLENOL) 325 MG tablet Take 2 tablets (650 mg total) by mouth every 4 (four) hours as needed for headache or mild pain. 30 tablet 2    apixaban (ELIQUIS) 2.5 MG TABS tablet Take 1 tablet (2.5 mg total) by mouth 2 (two) times daily. 60 tablet 11    carbamide peroxide (DEBROX) 6.5 % OTIC solution Place 5 drops into both ears 2 (two) times daily. 15 mL 0    docusate sodium (COLACE) 100 MG capsule Take 100 mg by mouth daily as needed for mild constipation.      furosemide (LASIX) 40 MG tablet Take 1 tablet (40 mg total) by mouth daily.  30 tablet 2    glimepiride (AMARYL) 2 MG tablet Take 1 tablet (2 mg total) by mouth every morning. 30 tablet 11    glipiZIDE (GLUCOTROL) 5 MG tablet Take 5 mg by mouth daily before breakfast.      glucose blood test strip 1 each by Other route 2 (two) times daily. Use as instructed 2 x daily. E11.65 EZ MAX 100 each 5    isosorbide-hydrALAZINE (BIDIL) 20-37.5 MG tablet Take 1 tablet by mouth 2 (two) times daily. 30 tablet 2    linagliptin (TRADJENTA) 5 MG TABS tablet Take 5 mg by mouth daily.      losartan (COZAAR) 100 MG tablet Take 100 mg by mouth daily.      losartan (COZAAR) 50 MG tablet Take 50 mg by mouth daily.      Menthol-Methyl Salicylate (MUSCLE RUB) 10-15 % CREA Apply 1 application topically 2 (two) times daily as  needed for muscle pain. Both Knees 85 g 0    metoprolol succinate (TOPROL-XL) 25 MG 24 hr tablet Take 1 tablet (25 mg total) by mouth daily. 30 tablet 3    OLANZapine (ZYPREXA) 2.5 MG tablet Take 1 tablet (2.5 mg total) by mouth at bedtime as needed (Dementia /insomnia/restlessness/agitation). 30 tablet 2    simvastatin (ZOCOR) 20 MG tablet Take 20 mg by mouth at bedtime.       Assessment: Pharmacy consulted to dose heparin in patient with chest pain/ACS.  Patient is on apixaban prior to admission with last dose unknown (patient unable to tell us)  but has been here since last night 3/9 so can start heparin today. Will need to monitor based on aPTT until correlation with heparin levels.   Heparin was held prior to drawing level from same side the heparin is infusing into. Per RN, phlebotomist had trouble getting the level otherwise. aPTT level is subtherapeutic at 35. Heparin level is elevated as expected. No issues with infusion or bleeding per RN.  Goal of Therapy:  Heparin level 0.3-0.7 units/ml aPTT 66-102 seconds Monitor platelets by anticoagulation protocol: Yes   Plan:  Increase heparin infusion to 1150 units/hr Check aPTT in 8 hours  Check aPTT and heparin  level daily while on heparin Continue to monitor H&H and platelets   Thank you for allowing [place] to participate in this patients care. Jens Som, PharmD 09/19/2022 8:48 PM  **Pharmacist phone directory can be found on [email or web address] listed under [place]**

## 2022-09-19 NOTE — ED Notes (Signed)
Dr Manuella Ghazi called back and aware of troponin result

## 2022-09-19 NOTE — Progress Notes (Signed)
ANTICOAGULATION CONSULT NOTE - Initial Consult  Pharmacy Consult for heparin Indication: chest pain/ACS  Allergies  Allergen Reactions   Other     "some kind of fluid pill I can't take"     Patient Measurements: Height: 6' (182.9 cm) Weight: 81 kg (178 lb 9.2 oz) IBW/kg (Calculated) : 77.6 Heparin Dosing Weight: 81 kg  Vital Signs: Temp: 98.9 F (37.2 C) (03/10 0654) Temp Source: Oral (03/10 0654) BP: 158/72 (03/10 1000) Pulse Rate: 63 (03/10 1000)  Labs: Recent Labs    09/19/22 0043 09/19/22 0344 09/19/22 0927  HGB 9.7*  --   --   HCT 31.3*  --   --   PLT 185  --   --   APTT 47*  --   --   LABPROT 20.3*  --   --   INR 1.8*  --   --   CREATININE 2.13*  --   --   TROPONINIHS 140* 203* 328*    Estimated Creatinine Clearance: 25.3 mL/min (A) (by C-G formula based on SCr of 2.13 mg/dL (H)).   Medical History: Past Medical History:  Diagnosis Date   Atrial fibrillation and flutter (HCC)    Cardiomyopathy ([place])    CKD (chronic kidney disease) stage 3, GFR 30-59 ml/min (HCC)    Complete heart block (HCC)    Diabetes mellitus    Hyperlipidemia    Hypertension    Pacemaker    Sinus bradycardia     Medications:  (Not in a hospital admission)   Assessment: Pharmacy consulted to dose heparin in patient with chest pain/ACS.  Patient is on apixaban prior to admission with last dose unknown (patient unable to tell us)  but has been here since last night 3/9 so can start heparin today. Will need to monitor based on aPTT until correlation with heparin levels.  Goal of Therapy:  Heparin level 0.3-0.7 units/ml aPTT 66-102 seconds Monitor platelets by anticoagulation protocol: Yes   Plan:  Start heparin infusion at 950 units/hr Check anti-Xa level in 8 hours and daily while on heparin Continue to monitor H&H and platelets  Margot Ables, PharmD Clinical Pharmacist 09/19/2022 10:55 AM

## 2022-09-19 NOTE — H&P (Addendum)
History and Physical    Kyle Schultz [id number] DOB: 15-Apr-1930 DOA: 09/18/2022  PCP: Carrolyn Meiers, MD   Patient coming from: Home  Chief Complaint: Back pain  HPI: Kyle Schultz is a 89 y.o. male with medical history significant for combined systolic and diastolic CHF, hypertension, permanent atrial fibrillation with cardiac pacemaker on Eliquis, CKD stage IV, type 2 diabetes, and dementia who presented to the ED with complaints of some low back pain.  He states that his hips also bother him and it is difficult to obtain further history from him as he does not say very much.  He denies any chest pain or shortness of breath however.  He was also noted to be febrile on admission.   ED Course: Vital signs are stable and patient is afebrile.  Chest x-ray with signs of bilateral edema noted.  Troponin elevating 140>>203>>328.  EKG with ventricular paced rhythm.  Started on antibiotics in the ED for suspicion of possible pneumonia and given Lasix for diuresis.  CT head with no acute findings.  Review of Systems: Difficult to obtain given patient condition.  Past Medical History:  Diagnosis Date   Atrial fibrillation and flutter (HCC)    Cardiomyopathy ([place])    CKD (chronic kidney disease) stage 3, GFR 30-59 ml/min (HCC)    Complete heart block (HCC)    Diabetes mellitus    Hyperlipidemia    Hypertension    Pacemaker    Sinus bradycardia     Past Surgical History:  Procedure Laterality Date   COLONOSCOPY  2010   Two 4 mm sessile hepatic flexure polyps, one slightly pedunculated 6 to 8 mm sessile descending colon polyp, small internal hemorrhoids. Simple adenomas.    colonscopy     HEMORRHOID SURGERY     PACEMAKER INSERTION  [address] jude   PPM GENERATOR CHANGEOUT N/A 09/27/2019   Procedure: PPM GENERATOR CHANGEOUT;  Surgeon: Evans Lance, MD;  Location: [facility];  Service: Cardiovascular;  Laterality: N/A;   UMBILICAL HERNIA REPAIR  04/2008     reports  that he has never smoked. He has never used smokeless tobacco. He reports that he does not drink alcohol and does not use drugs.  Allergies  Allergen Reactions   Other     "some kind of fluid pill I can't take"     Family History  Problem Relation Age of Onset   Heart disease Father    Cancer Sister    Cancer Sister    Cancer Sister    ALS Sister    Colon cancer Neg Hx     Prior to Admission medications   Medication Sig Start Date End Date Taking? Authorizing Provider  acetaminophen (TYLENOL) 325 MG tablet Take 2 tablets (650 mg total) by mouth every 4 (four) hours as needed for headache or mild pain. 01/07/21   Roxan Hockey, MD  apixaban (ELIQUIS) 2.5 MG TABS tablet Take 1 tablet (2.5 mg total) by mouth 2 (two) times daily. 10/28/20   Evans Lance, MD  carbamide peroxide (DEBROX) 6.5 % OTIC solution Place 5 drops into both ears 2 (two) times daily. 07/05/19   Domenic Moras, PA-C  docusate sodium (COLACE) 100 MG capsule Take 100 mg by mouth daily as needed for mild constipation.    [provider]  furosemide (LASIX) 40 MG tablet Take 1 tablet (40 mg total) by mouth daily. 01/08/21   Roxan Hockey, MD  glimepiride (AMARYL) 2 MG tablet Take  1 tablet (2 mg total) by mouth every morning. 01/07/21 01/07/22  Emokpae, Courage, MD  glipiZIDE (GLUCOTROL) 5 MG tablet Take 5 mg by mouth daily before breakfast.    [provider]  glucose blood test strip 1 each by Other route 2 (two) times daily. Use as instructed 2 x daily. E11.65 EZ MAX 05/01/18   Cassandria Anger, MD  isosorbide-hydrALAZINE (BIDIL) 20-37.5 MG tablet Take 1 tablet by mouth 2 (two) times daily. 01/07/21   Roxan Hockey, MD  linagliptin (TRADJENTA) 5 MG TABS tablet Take 5 mg by mouth daily.    [provider]  losartan (COZAAR) 100 MG tablet Take 100 mg by mouth daily. 04/02/21   [provider]  losartan (COZAAR) 50 MG tablet Take 50 mg by mouth daily. 06/05/21   [provider]  Menthol-Methyl Salicylate (MUSCLE RUB) 10-15 % CREA Apply 1 application topically 2 (two) times daily as needed for muscle pain. Both Knees 01/07/21   Roxan Hockey, MD  metoprolol succinate (TOPROL-XL) 25 MG 24 hr tablet Take 1 tablet (25 mg total) by mouth daily. 01/08/21   Roxan Hockey, MD  OLANZapine (ZYPREXA) 2.5 MG tablet Take 1 tablet (2.5 mg total) by mouth at bedtime as needed (Dementia /insomnia/restlessness/agitation). 01/07/21 01/07/22  Roxan Hockey, MD  simvastatin (ZOCOR) 20 MG tablet Take 20 mg by mouth at bedtime. 10/04/20   [provider]    Physical Exam: Vitals:   09/19/22 0900 09/19/22 0930 09/19/22 1000 09/19/22 1102  BP: (!) 163/69 (!) 165/67 (!) 158/72   Pulse: 62 62 63   Resp: '20 18 14   '$ Temp:    99.5 F (37.5 C)  TempSrc:    Axillary  SpO2: 99% 99% 100%   Weight:      Height:        Constitutional: NAD, calm, comfortable Vitals:   09/19/22 0900 09/19/22 0930 09/19/22 1000 09/19/22 1102  BP: (!) 163/69 (!) 165/67 (!) 158/72   Pulse: 62 62 63   Resp: '20 18 14   '$ Temp:    99.5 F (37.5 C)  TempSrc:    Axillary  SpO2: 99% 99% 100%   Weight:      Height:       Eyes: lids and conjunctivae normal Neck: normal, supple Respiratory: clear to auscultation bilaterally. Normal respiratory effort. No accessory muscle use.  Cardiovascular: Regular rate and rhythm, no murmurs. Abdomen: no tenderness, no distention. Bowel sounds positive.  Musculoskeletal:  No edema. Skin: no rashes, lesions, ulcers.  Psychiatric: Flat affect  Labs on Admission: I have personally reviewed following labs and imaging studies  CBC: Recent Labs  Lab 09/19/22 0043  WBC 8.0  NEUTROABS 5.7  HGB 9.7*  HCT 31.3*  MCV 81.7  PLT [id number]   Basic Metabolic Panel: Recent Labs  Lab 09/19/22 0043  NA 138  K 3.9  CL 107  CO2 19*  GLUCOSE 178*  BUN 40*  CREATININE 2.13*  CALCIUM 9.2   GFR: Estimated Creatinine Clearance: 25.3 mL/min (A) (by C-G  formula based on SCr of 2.13 mg/dL (H)). Liver Function Tests: Recent Labs  Lab 09/19/22 0043  AST 25  ALT 14  ALKPHOS 45  BILITOT 1.2  PROT 7.4  ALBUMIN 3.4*   Recent Labs  Lab 09/19/22 0043  LIPASE 27   No results for input(s): "AMMONIA" in the last 168 hours. Coagulation Profile: Recent Labs  Lab 09/19/22 0043  INR 1.8*   Cardiac Enzymes: No results for input(s): "CKTOTAL", "CKMB", "CKMBINDEX", "  TROPONINI" in the last 168 hours. BNP (last 3 results) No results for input(s): "PROBNP" in the last 8760 hours. HbA1C: No results for input(s): "HGBA1C" in the last 72 hours. CBG: No results for input(s): "GLUCAP" in the last 168 hours. Lipid Profile: No results for input(s): "CHOL", "HDL", "LDLCALC", "TRIG", "CHOLHDL", "LDLDIRECT" in the last 72 hours. Thyroid Function Tests: No results for input(s): "TSH", "T4TOTAL", "FREET4", "T3FREE", "THYROIDAB" in the last 72 hours. Anemia Panel: No results for input(s): "VITAMINB12", "FOLATE", "FERRITIN", "TIBC", "IRON", "RETICCTPCT" in the last 72 hours. Urine analysis:    Component Value Date/Time   COLORURINE YELLOW 09/19/2022 0027   APPEARANCEUR CLEAR 09/19/2022 0027   LABSPEC 1.012 09/19/2022 0027   PHURINE 5.0 09/19/2022 0027   GLUCOSEU NEGATIVE 09/19/2022 0027   HGBUR NEGATIVE 09/19/2022 0027   BILIRUBINUR NEGATIVE 09/19/2022 0027   KETONESUR 5 (A) 09/19/2022 0027   PROTEINUR NEGATIVE 09/19/2022 0027   UROBILINOGEN 0.2 01/12/2015 0845   NITRITE NEGATIVE 09/19/2022 0027   LEUKOCYTESUR NEGATIVE 09/19/2022 0027    Radiological Exams on Admission: CT ABDOMEN PELVIS WO CONTRAST  Result Date: 09/19/2022 CLINICAL DATA:  Fever and abdominal pain. EXAM: CT ABDOMEN AND PELVIS WITHOUT CONTRAST TECHNIQUE: Multidetector CT imaging of the abdomen and pelvis was performed following the standard protocol without IV contrast. RADIATION DOSE REDUCTION: This exam was performed according to the departmental dose-optimization program  which includes automated exposure control, adjustment of the mA and/or kV according to patient size and/or use of iterative reconstruction technique. COMPARISON:  Limited comparison available with renal ultrasound 12/07/2013 FINDINGS: Lower chest: There is mild cardiomegaly and a small anterior pericardial effusion. There is metallic artifact from dual lead pacemaker wires in the right heart. There are bilateral minimal layering pleural effusions. There is a calcified granuloma in the right lower lobe. The lung bases are clear of infiltrates. There is a moderate-sized hiatal hernia. Mild gynecomastia. Hepatobiliary: There is loss of fine detail due to breathing motion. In hepatic segment 7 there is a 6 cm cyst, Hounsfield density 544. In segment 4A, there is a 4.9 cm cyst, Hounsfield density of 10.6. Simple cysts were noted in the right lobe of the liver on the prior ultrasound. They were not measured but appear to be similar size. There are additional smaller scattered cysts in both main lobes and a few scattered additional too small to characterize hypodensities. Near the lower tip of segment 6, there is a 2.2 cm low-density lesion above the usual density of fluid, Hounsfield density of 29. This is on 2:31. If clinically warranted taking into account advanced age, MRI without and with contrast is suggested to assess for solid lesion or complex cystic lesion versus hemangioma. Gallbladder and bile ducts are unremarkable. Pancreas: No abnormality is seen without contrast. Spleen: No abnormality is seen without contrast. Adrenals/Urinary Tract: There is no adrenal mass. Due to breathing motion the kidneys are not optimally evaluated. There are 2 rounded masses exophytic from the lateral midpole cortex of the right kidney, both measuring above the usual density of fluid, both with Hounsfield density of 34. The more anterior lesion measures 2.7 cm and the more posterior lesion measures 2.4 cm. There is a densely  calcified 1.6 cm rounded cortical lesion on the right in the mid to lower pole most likely a calcified cyst. There is a homogeneous cyst in the inferior pole of the right kidney measuring 2.2 cm, Hounsfield density of 8.3, and a homogeneous cyst in the posterior aspect of the left kidney measuring 1.3  cm, Hounsfield density of 4. No urinary stone is seen.  There is no hydronephrosis on the left. There is mild hydroureteronephrosis all the way to the bladder on the right, without visible obstructive abnormality and possibly related to marked distention of the bladder. The bladder dome extends as high as L2. No wall thickening or inflammation is seen. This is possibly due to impression on the bladder base by an enlarged prostate. Stomach/Bowel: No dilatation or wall thickening, including the appendix. There is colonic diverticulosis without focal diverticulitis. Vascular/Lymphatic: Aortic atherosclerosis. No enlarged abdominal or pelvic lymph nodes. Multiple pelvic phleboliths. Reproductive: Enlarged prostate, 5.1 cm transverse with bladder base impression Other: No free air, free hemorrhage, free fluid or incarcerated hernia. Musculoskeletal: Osteopenia and degenerative change lumbar spine. Mild lumbar dextroscoliosis. No acute or other significant osseous findings. IMPRESSION: 1. Motion limited study. 2. Cardiomegaly with small anterior pericardial effusion. Minimal pleural effusions. Moderate-sized hiatal hernia. 3. Hepatic cysts and additional too small to characterize hypodensities. 4. 2.2 cm low-density lesion in segment 6 of the liver, above the usual density of fluid. If clinically warranted taking into account advanced age, MRI without and with contrast is suggested to assess for solid lesion or complex cystic lesion versus hemangioma. 5. Bilateral renal cysts. 6. 2 rounded masses in the right kidney measuring above the usual density of fluid, measuring 2.7 cm and 2.4 cm. These could be complex cysts or solid  masses. MRI without and with contrast recommended, if clinically warranted taking into account age. 7. Mild right hydroureteronephrosis all the way to the bladder, without visible obstructive abnormality. This is possibly related to marked distention of the bladder. 8. Prostatomegaly with bladder base impression. 9. Diverticulosis without evidence of diverticulitis. 10. Aortic atherosclerosis. 11. Osteopenia and degenerative change. Aortic Atherosclerosis (ICD10-I70.0). Electronically Signed   By: Telford Nab M.D.   On: 09/19/2022 01:31   CT HEAD WO CONTRAST (5MM)  Result Date: 09/19/2022 CLINICAL DATA:  Delirium EXAM: CT HEAD WITHOUT CONTRAST TECHNIQUE: Contiguous axial images were obtained from the base of the skull through the vertex without intravenous contrast. RADIATION DOSE REDUCTION: This exam was performed according to the departmental dose-optimization program which includes automated exposure control, adjustment of the mA and/or kV according to patient size and/or use of iterative reconstruction technique. COMPARISON:  CT head 01/02/2021 FINDINGS: Brain: Cerebral ventricle sizes are concordant with the degree of cerebral volume loss. Patchy and confluent areas of decreased attenuation are noted throughout the deep and periventricular white matter of the cerebral hemispheres bilaterally, compatible with chronic microvascular ischemic disease. no evidence of large-territorial acute infarction. No parenchymal hemorrhage. No mass lesion. No extra-axial collection. No mass effect or midline shift. No hydrocephalus. Basilar cisterns are patent. Vascular: No hyperdense vessel. Atherosclerotic calcifications are present within the cavernous internal carotid arteries. Skull: No acute fracture or focal lesion. Sinuses/Orbits: Paranasal sinuses and mastoid air cells are clear. The orbits are unremarkable. Other: None. IMPRESSION: No acute intracranial abnormality. Electronically Signed   By: Iven Finn  M.D.   On: 09/19/2022 01:10   DG Chest Port 1 View  Result Date: 09/19/2022 CLINICAL DATA:  Questionable sepsis.  Evaluate for abnormality. EXAM: PORTABLE CHEST 1 VIEW COMPARISON:  01/03/2021 FINDINGS: Stable cardiomegaly. Aortic atherosclerotic calcification. Left chest wall pacemaker. Patchy bilateral airspace and interstitial opacities suggestive of edema. Pulmonary vascular congestion no definite pleural effusion. No pneumothorax. No displaced rib fractures. Calcified granuloma right lower lung IMPRESSION: Patchy bilateral airspace and interstitial opacities suggestive of edema. Electronically Signed   By:  Placido Sou M.D.   On: 09/19/2022 00:58    EKG: Independently reviewed.  Ventricular paced rhythm, 60 bpm.  Assessment/Plan Principal Problem:   NSTEMI (non-ST elevated myocardial infarction) (HCC) Active Problems:   Acute on chronic combined systolic and diastolic CHF (congestive heart failure) /EF 35 to 40 %   CKD (chronic kidney disease), stage IV (HCC)   Dyslipidemia   Essential hypertension, benign   Type 2 diabetes mellitus with other circulatory complications (HCC)   Mixed hyperlipidemia    Acute on chronic combined diastolic and CHF exacerbation with possible NSTEMI -Cardiology previously recommended medical therapy over invasive options given age and CKD -Continue heparin drip for now -Patient given Lasix in ED, continue same for now -Monitor strict I's and O's and daily weights -2D echocardiogram, prior on 12/2020 with LVEF 35-40% with regional wall motion abnormalities -Cardiology evaluation in a.m.  Fever with possible community-acquired pneumonia -Continue azithromycin and Rocephin for now -Check procalcitonin -Follow CBC  Liver and kidney lesions on CT scan -Of undetermined significance -Recommendations for MRI to further evaluate; will consider further imaging in a.m.  Hypertension -Continue BiDil and metoprolol -Hold losartan while on IV  diuresis -Monitor closely  Permanent atrial fibrillation/cardiac pacemaker -Hold home Eliquis while on heparin drip -Continue to monitor on telemetry  CKD stage IV -Continue to monitor closely while on diuresis  Type 2 diabetes with hyperglycemia -SSI and carb modified diet  Dementia -Prior history of sundowning noted -Zyprexa at bedtime -Haldol as needed  DVT prophylaxis: Heparin drip Code Status: Full Family Communication: Tried calling son with no response, none at bedside Disposition Plan: Admit for diuresis and management of NSTEMI Consults called: Cardiology in a.m., palliative care Admission status: Inpatient, stepdown  Severity of Illness: The appropriate patient status for this patient is INPATIENT. Inpatient status is judged to be reasonable and necessary in order to provide the required intensity of service to ensure the patient's safety. The patient's presenting symptoms, physical exam findings, and initial radiographic and laboratory data in the context of their chronic comorbidities is felt to place them at high risk for further clinical deterioration. Furthermore, it is not anticipated that the patient will be medically stable for discharge from the hospital within 2 midnights of admission.   * I certify that at the point of admission it is my clinical judgment that the patient will require inpatient hospital care spanning beyond 2 midnights from the point of admission due to high intensity of service, high risk for further deterioration and high frequency of surveillance required.*   Brayn Eckstein D Derryck Shahan DO [facility]  If 7PM-7AM, please contact night-coverage [email or web address]  09/19/2022, 11:24 AM

## 2022-09-19 NOTE — ED Notes (Signed)
Lab at bedside for blood draw.

## 2022-09-19 NOTE — Progress Notes (Signed)
  Echocardiogram 2D Echocardiogram has been performed.  Kyle Schultz 09/19/2022, 3:07 PM

## 2022-09-19 NOTE — ED Provider Notes (Signed)
At change of shift patient still with some mild back pain, Foley catheter placed for urinary retention, found to have some infiltrates on x-ray that could be consistent with congestive heart failure, no source of fever found.  Discussed with hospitalist Dr. Manuella Ghazi who has been kind enough to admit this patient to hospital  No CP, EKG without ischemia Troponin elevated to > 200 on repeat Already on eliquis Multiple small abnormalities on CT scan including both liver and kidney of undetermined etiology   Noemi Chapel, MD 09/19/22 [phone]

## 2022-09-20 ENCOUNTER — Inpatient Hospital Stay (HOSPITAL_COMMUNITY): Payer: Medicare Other

## 2022-09-20 ENCOUNTER — Other Ambulatory Visit (HOSPITAL_COMMUNITY): Payer: Self-pay

## 2022-09-20 DIAGNOSIS — R509 Fever, unspecified: Secondary | ICD-10-CM | POA: Diagnosis not present

## 2022-09-20 DIAGNOSIS — Z950 Presence of cardiac pacemaker: Secondary | ICD-10-CM

## 2022-09-20 DIAGNOSIS — R5381 Other malaise: Secondary | ICD-10-CM

## 2022-09-20 DIAGNOSIS — I5A Non-ischemic myocardial injury (non-traumatic): Secondary | ICD-10-CM

## 2022-09-20 DIAGNOSIS — I5043 Acute on chronic combined systolic (congestive) and diastolic (congestive) heart failure: Secondary | ICD-10-CM

## 2022-09-20 DIAGNOSIS — I5023 Acute on chronic systolic (congestive) heart failure: Secondary | ICD-10-CM

## 2022-09-20 DIAGNOSIS — I214 Non-ST elevation (NSTEMI) myocardial infarction: Secondary | ICD-10-CM | POA: Diagnosis not present

## 2022-09-20 DIAGNOSIS — R339 Retention of urine, unspecified: Secondary | ICD-10-CM

## 2022-09-20 DIAGNOSIS — J189 Pneumonia, unspecified organism: Secondary | ICD-10-CM

## 2022-09-20 DIAGNOSIS — R7989 Other specified abnormal findings of blood chemistry: Secondary | ICD-10-CM

## 2022-09-20 DIAGNOSIS — I4821 Permanent atrial fibrillation: Secondary | ICD-10-CM

## 2022-09-20 DIAGNOSIS — Z515 Encounter for palliative care: Secondary | ICD-10-CM

## 2022-09-20 DIAGNOSIS — I484 Atypical atrial flutter: Secondary | ICD-10-CM

## 2022-09-20 DIAGNOSIS — N184 Chronic kidney disease, stage 4 (severe): Secondary | ICD-10-CM

## 2022-09-20 DIAGNOSIS — Z7189 Other specified counseling: Secondary | ICD-10-CM

## 2022-09-20 LAB — HEMOGLOBIN A1C
Hgb A1c MFr Bld: 7.8 % — ABNORMAL HIGH (ref 4.8–5.6)
Mean Plasma Glucose: 177 mg/dL

## 2022-09-20 LAB — GLUCOSE, CAPILLARY
Glucose-Capillary: 164 mg/dL — ABNORMAL HIGH (ref 70–99)
Glucose-Capillary: 160 mg/dL — ABNORMAL HIGH (ref 70–99)
Glucose-Capillary: 171 mg/dL — ABNORMAL HIGH (ref 70–99)
Glucose-Capillary: 244 mg/dL — ABNORMAL HIGH (ref 70–99)

## 2022-09-20 LAB — BASIC METABOLIC PANEL
Anion gap: 12 (ref 5–15)
BUN: 40 mg/dL — ABNORMAL HIGH (ref 8–23)
CO2: 20 mmol/L — ABNORMAL LOW (ref 22–32)
Calcium: 8.9 mg/dL (ref 8.9–10.3)
Chloride: 106 mmol/L (ref 98–111)
Creatinine, Ser: 1.96 mg/dL — ABNORMAL HIGH (ref 0.61–1.24)
GFR, Estimated: 32 mL/min — ABNORMAL LOW (ref >60)
Glucose, Bld: 162 mg/dL — ABNORMAL HIGH (ref 70–99)
Potassium: 3.8 mmol/L (ref 3.5–5.1)
Sodium: 138 mmol/L (ref 135–145)

## 2022-09-20 LAB — CBC
HCT: 30.1 % — ABNORMAL LOW (ref 39.0–52.0)
Hemoglobin: 9.3 g/dL — ABNORMAL LOW (ref 13.0–17.0)
MCH: 25.1 pg — ABNORMAL LOW (ref 26.0–34.0)
MCHC: 30.9 g/dL (ref 30.0–36.0)
MCV: 81.4 fL (ref 80.0–100.0)
Platelets: 167 10*3/uL (ref 150–400)
RBC: 3.7 MIL/uL — ABNORMAL LOW (ref 4.22–5.81)
RDW: 15.9 % — ABNORMAL HIGH (ref 11.5–15.5)
WBC: 6.9 10*3/uL (ref 4.0–10.5)
nRBC: 0 % (ref 0.0–0.2)

## 2022-09-20 LAB — TROPONIN I (HIGH SENSITIVITY): Troponin I (High Sensitivity): 183 ng/L (ref <18)

## 2022-09-20 LAB — MRSA NEXT GEN BY PCR, NASAL: MRSA by PCR Next Gen: NOT DETECTED

## 2022-09-20 LAB — HEPARIN LEVEL (UNFRACTIONATED): Heparin Unfractionated: >1.1 IU/mL — ABNORMAL HIGH (ref 0.30–0.70)

## 2022-09-20 LAB — APTT: aPTT: 70 seconds — ABNORMAL HIGH (ref 24–36)

## 2022-09-20 LAB — MAGNESIUM: Magnesium: 1.8 mg/dL (ref 1.7–2.4)

## 2022-09-20 MED ORDER — FUROSEMIDE 10 MG/ML IJ SOLN
40.0000 mg | Freq: Three times a day (TID) | INTRAMUSCULAR | Status: DC
  Administered 2022-09-20 (×2): 40 mg via INTRAVENOUS
  Filled 2022-09-20 (×2): qty 4

## 2022-09-20 MED ORDER — APIXABAN 2.5 MG PO TABS
2.5000 mg | ORAL_TABLET | Freq: Two times a day (BID) | ORAL | Status: DC
  Administered 2022-09-20 – 2022-09-24 (×9): 2.5 mg via ORAL
  Filled 2022-09-20 (×9): qty 1

## 2022-09-20 NOTE — Progress Notes (Signed)
[facility] for heparin>>apixaban Indication: chest pain/ACS  Allergies  Allergen Reactions   Other     "some kind of fluid pill I can't take"     Patient Measurements: Height: 6' (182.9 cm) Weight: 87.8 kg (193 lb 9 oz) IBW/kg (Calculated) : 77.6 Heparin Dosing Weight: 81 kg  Vital Signs: Temp: 98.5 F (36.9 C) (03/11 0600) Temp Source: Oral (03/11 0600) BP: 128/46 (03/11 0300) Pulse Rate: 60 (03/11 0300)  Labs: Recent Labs    09/19/22 0043 09/19/22 0344 09/19/22 0927 09/19/22 1131 09/19/22 1917 09/20/22 0320  HGB 9.7*  --   --   --   --  9.3*  HCT 31.3*  --   --   --   --  30.1*  PLT 185  --   --   --   --  167  APTT 47*  --   --   --  35 70*  LABPROT 20.3*  --   --   --   --   --   INR 1.8*  --   --   --   --   --   HEPARINUNFRC  --   --   --   --  >1.10* >1.10*  CREATININE 2.13*  --   --   --   --  1.96*  TROPONINIHS 140* 203* 328* 289*  --   --      Estimated Creatinine Clearance: 27.5 mL/min (A) (by C-G formula based on SCr of 1.96 mg/dL (H)).   Medical History: Past Medical History:  Diagnosis Date   Atrial fibrillation and flutter (HCC)    Cardiomyopathy ([place])    CKD (chronic kidney disease) stage 3, GFR 30-59 ml/min (HCC)    Complete heart block (HCC)    Diabetes mellitus    Hyperlipidemia    Hypertension    Pacemaker    Sinus bradycardia     Medications:  Medications Prior to Admission  Medication Sig Dispense Refill Last Dose   acetaminophen (TYLENOL) 325 MG tablet Take 2 tablets (650 mg total) by mouth every 4 (four) hours as needed for headache or mild pain. 30 tablet 2 unk   apixaban (ELIQUIS) 2.5 MG TABS tablet Take 1 tablet (2.5 mg total) by mouth 2 (two) times daily. 60 tablet 11 09/19/2022 at am   docusate sodium (COLACE) 100 MG capsule Take 100 mg by mouth daily as needed for mild constipation.   unk   furosemide (LASIX) 40 MG tablet Take 1 tablet (40 mg total) by mouth daily. 30 tablet 2  09/19/2022   glimepiride (AMARYL) 2 MG tablet Take 1 tablet (2 mg total) by mouth every morning. 30 tablet 11 09/19/2022   glipiZIDE (GLUCOTROL) 5 MG tablet Take 5 mg by mouth 2 (two) times daily before a meal.   09/19/2022   isosorbide-hydrALAZINE (BIDIL) 20-37.5 MG tablet Take 1 tablet by mouth 2 (two) times daily. 30 tablet 2 09/19/2022   losartan (COZAAR) 100 MG tablet Take 100 mg by mouth daily.   09/19/2022   metoprolol succinate (TOPROL-XL) 25 MG 24 hr tablet Take 1 tablet (25 mg total) by mouth daily. 30 tablet 3 09/19/2022 at am   simvastatin (ZOCOR) 20 MG tablet Take 20 mg by mouth at bedtime.   Past Week   linagliptin (TRADJENTA) 5 MG TABS tablet Take 5 mg by mouth daily. (Patient not taking: Reported on 09/20/2022)   Not Taking    Assessment: Pharmacy consulted to dose heparin in patient with  chest pain/ACS. Patient is on apixaban prior to admission with last dose unknown (patient unable to tell us)  but has been here since last night 3/9 so can start heparin today. Will need to monitor based on aPTT until correlation with heparin levels.   Heparin level >1.1 due to recent apixaban dose, aptt within goal at 70s. Hemoglobin down slightly 9.7>9.3. plt ok at 167. No issues with infusion or bleeding noted.   Goal of Therapy:  Heparin level 0.3-0.7 units/ml aPTT 66-102 seconds Monitor platelets by anticoagulation protocol: Yes   Plan:  Continue heparin infusion at 1150 units/hr Check confirmatory aPTT this morning Check aPTT and heparin level daily while on heparin Continue to monitor H&H and platelets   Thank you for allowing [place] to participate in this patients care.  Erin Hearing PharmD., BCPS Clinical Pharmacist 09/20/2022 9:23 AM  Addendum:  Patient transitioned back to low dose apixaban.   Erin Hearing PharmD., BCPS Clinical Pharmacist 09/20/2022 12:07 PM

## 2022-09-20 NOTE — Progress Notes (Addendum)
PROGRESS NOTE    Kyle Schultz  [id number] DOB: February 29, 1930 DOA: 09/18/2022 PCP: Carrolyn Meiers, MD   Brief Narrative:     Kyle Schultz is a 89 y.o. male with medical history significant for combined systolic and diastolic CHF, hypertension, permanent atrial fibrillation with cardiac pacemaker on Eliquis, CKD stage IV, type 2 diabetes, and dementia who presented to the ED with complaints of some low back pain.  He denies any complaints of chest pain or shortness of breath and was admitted with acute on chronic combined CHF exacerbation with possible non-STEMI.  2D echocardiogram 3/10 with LVEF 40-45% and some regional wall motion abnormalities that appear to be consistent with prior 2D echocardiogram in 2022.  Cardiology evaluation pending.  Assessment & Plan:   Principal Problem:   NSTEMI (non-ST elevated myocardial infarction) ([place]) Active Problems:   Acute on chronic combined systolic and diastolic CHF (congestive heart failure) /EF 35 to 40 %   CKD (chronic kidney disease), stage IV (HCC)   Dyslipidemia   Essential hypertension, benign   Type 2 diabetes mellitus with other circulatory complications (HCC)   Mixed hyperlipidemia  Assessment and Plan:  Acute on chronic combined diastolic and CHF exacerbation with possible NSTEMI -Cardiology previously recommended medical therapy over invasive options given age and CKD -Continue heparin drip for now -Continue IV Lasix 40 mg twice daily with adequate diuresis noted -Monitor strict I's and O's and daily weights; -2.5L balance in 24 hours -2D echocardiogram, prior on 12/2020 with LVEF 35-40% with regional wall motion abnormalities -Repeat 2D echocardiogram with some wall motion abnormalities as prior and LVEF 40-45% and appears stable -Cardiology evaluation pending   Fever with possible community-acquired pneumonia -Continue azithromycin and Rocephin for now -Procalcitonin 0.46 -Noted to have ongoing temperature elevation  up to 100.8 this morning, continue antibiotics for now   Liver and kidney lesions on CT scan -Of undetermined significance -Cannot have MRI due to pacemaker -Can follow-up outpatient to see if any repeat CT scanning is desired, but with advanced age, would not recommend any other form of follow-up   Hypertension -Continue BiDil and metoprolol -Hold losartan while on IV diuresis -Monitor closely   Permanent atrial fibrillation/cardiac pacemaker -Hold home Eliquis while on heparin drip -Continue to monitor on telemetry   CKD stage IV -Continue to monitor closely while on diuresis   Type 2 diabetes with hyperglycemia -SSI and carb modified diet   Dementia -Prior history of sundowning noted -Zyprexa at bedtime -Haldol as needed  Low back pain -Pain medications as needed and up in chair -PT evaluation  DVT prophylaxis: Heparin drip Code Status: Full Family Communication: None at bedside Disposition Plan: Awaiting cardiology evaluation Status is: Inpatient Remains inpatient appropriate because: Need for IV medications  Consultants:  Cardiology  Procedures:  None  Antimicrobials:  Anti-infectives (From admission, onward)    Start     Dose/Rate Route Frequency Ordered Stop   09/20/22 0900  cefTRIAXone (ROCEPHIN) 1 g in sodium chloride 0.9 % 100 mL IVPB        1 g 200 mL/hr over 30 Minutes Intravenous Every 24 hours 09/19/22 1131     09/19/22 0900  cefTRIAXone (ROCEPHIN) 1 g in sodium chloride 0.9 % 100 mL IVPB        1 g 200 mL/hr over 30 Minutes Intravenous  Once 09/19/22 0851 09/19/22 0937   09/19/22 0900  azithromycin (ZITHROMAX) 500 mg in sodium chloride 0.9 % 250 mL IVPB  500 mg 250 mL/hr over 60 Minutes Intravenous Every 24 hours 09/19/22 0851        Subjective: Patient seen and evaluated today with no new acute complaints or concerns. No acute concerns or events noted overnight.  Denies any chest pain or shortness of breath and has some ongoing low  back pain.  Objective: Vitals:   09/20/22 0300 09/20/22 0600 09/20/22 0800 09/20/22 1007  BP: (!) 128/46  (!) 158/53 111/63  Pulse: 60  63 65  Resp: '18  19 18  '$ Temp:  98.5 F (36.9 C) (!) 100.8 F (38.2 C)   TempSrc:  Oral Oral   SpO2: 100%  100% 100%  Weight:  87.8 kg    Height:        Intake/Output Summary (Last 24 hours) at 09/20/2022 1101 Last data filed at 09/20/2022 0600 Gross per 24 hour  Intake 1122.77 ml  Output 3600 ml  Net -2477.23 ml   Filed Weights   09/18/22 2348 09/19/22 1712 09/20/22 0600  Weight: 81 kg 86.9 kg 87.8 kg    Examination:  General exam: Appears calm and comfortable  Respiratory system: Clear to auscultation. Respiratory effort normal. Cardiovascular system: S1 & S2 heard, RRR.  Gastrointestinal system: Abdomen is soft Central nervous system: Alert and awake Extremities: No edema Skin: No significant lesions noted Psychiatry: Flat affect.    Data Reviewed: I have personally reviewed following labs and imaging studies  CBC: Recent Labs  Lab 09/19/22 0043 09/20/22 0320  WBC 8.0 6.9  NEUTROABS 5.7  --   HGB 9.7* 9.3*  HCT 31.3* 30.1*  MCV 81.7 81.4  PLT 185 [id number]   Basic Metabolic Panel: Recent Labs  Lab 09/19/22 0043 09/20/22 0320  NA 138 138  K 3.9 3.8  CL 107 106  CO2 19* 20*  GLUCOSE 178* 162*  BUN 40* 40*  CREATININE 2.13* 1.96*  CALCIUM 9.2 8.9  MG  --  1.8   GFR: Estimated Creatinine Clearance: 27.5 mL/min (A) (by C-G formula based on SCr of 1.96 mg/dL (H)). Liver Function Tests: Recent Labs  Lab 09/19/22 0043  AST 25  ALT 14  ALKPHOS 45  BILITOT 1.2  PROT 7.4  ALBUMIN 3.4*   Recent Labs  Lab 09/19/22 0043  LIPASE 27   No results for input(s): "AMMONIA" in the last 168 hours. Coagulation Profile: Recent Labs  Lab 09/19/22 0043  INR 1.8*   Cardiac Enzymes: No results for input(s): "CKTOTAL", "CKMB", "CKMBINDEX", "TROPONINI" in the last 168 hours. BNP (last 3 results) No results for input(s):  "PROBNP" in the last 8760 hours. HbA1C: No results for input(s): "HGBA1C" in the last 72 hours. CBG: Recent Labs  Lab 09/19/22 1226 09/19/22 1523 09/19/22 1720 09/19/22 2205 09/20/22 0817  GLUCAP 189* 133* 150* 153* 164*   Lipid Profile: No results for input(s): "CHOL", "HDL", "LDLCALC", "TRIG", "CHOLHDL", "LDLDIRECT" in the last 72 hours. Thyroid Function Tests: No results for input(s): "TSH", "T4TOTAL", "FREET4", "T3FREE", "THYROIDAB" in the last 72 hours. Anemia Panel: No results for input(s): "VITAMINB12", "FOLATE", "FERRITIN", "TIBC", "IRON", "RETICCTPCT" in the last 72 hours. Sepsis Labs: Recent Labs  Lab 09/19/22 0043 09/19/22 0344 09/19/22 1131  PROCALCITON  --   --  0.46  LATICACIDVEN 1.6 1.6  --     Recent Results (from the past 240 hour(s))  Blood Culture (routine x 2)     Status: None (Preliminary result)   Collection Time: 09/19/22 12:43 AM   Specimen: Right Antecubital; Blood  Result Value Ref Range  Status   Specimen Description   Final    RIGHT ANTECUBITAL BOTTLES DRAWN AEROBIC AND ANAEROBIC   Special Requests Blood Culture adequate volume  Final   Culture   Final    NO GROWTH < 24 HOURS Performed at [facility], [address] [place], [place] [address]    Report Status PENDING  Incomplete  Resp panel by RT-PCR (RSV, Flu A&B, Covid) Anterior Nasal Swab     Status: None   Collection Time: 09/19/22  1:00 AM   Specimen: Anterior Nasal Swab  Result Value Ref Range Status   SARS Coronavirus 2 by RT PCR NEGATIVE NEGATIVE Final    Comment: (NOTE) SARS-CoV-2 target nucleic acids are NOT DETECTED.  The SARS-CoV-2 RNA is generally detectable in upper respiratory specimens during the acute phase of infection. The lowest concentration of SARS-CoV-2 viral copies this assay can detect is 138 copies/mL. A negative result does not preclude SARS-Cov-2 infection and should not be used as the sole basis for treatment or other patient management decisions. A  negative result may occur with  improper specimen collection/handling, submission of specimen other than nasopharyngeal swab, presence of viral mutation(s) within the areas targeted by this assay, and inadequate number of viral copies(<138 copies/mL). A negative result must be combined with clinical observations, patient history, and epidemiological information. The expected result is Negative.  Fact Sheet for Patients:  EntrepreneurPulse.com.au  Fact Sheet for Healthcare Providers:  IncredibleEmployment.be  This test is no t yet approved or cleared by the [place] FDA and  has been authorized for detection and/or diagnosis of SARS-CoV-2 by FDA under an Emergency Use Authorization (EUA). This EUA will remain  in effect (meaning this test can be used) for the duration of the COVID-19 declaration under Section 564(b)(1) of the Act, 21 U.S.C.section 360bbb-3(b)(1), unless the authorization is terminated  or revoked sooner.       Influenza A by PCR NEGATIVE NEGATIVE Final   Influenza B by PCR NEGATIVE NEGATIVE Final    Comment: (NOTE) The Xpert Xpress SARS-CoV-2/FLU/RSV plus assay is intended as an aid in the diagnosis of influenza from Nasopharyngeal swab specimens and should not be used as a sole basis for treatment. Nasal washings and aspirates are unacceptable for Xpert Xpress SARS-CoV-2/FLU/RSV testing.  Fact Sheet for Patients: EntrepreneurPulse.com.au  Fact Sheet for Healthcare Providers: IncredibleEmployment.be  This test is not yet approved or cleared by the [place] FDA and has been authorized for detection and/or diagnosis of SARS-CoV-2 by FDA under an Emergency Use Authorization (EUA). This EUA will remain in effect (meaning this test can be used) for the duration of the COVID-19 declaration under Section 564(b)(1) of the Act, 21 U.S.C. section 360bbb-3(b)(1), unless the authorization  is terminated or revoked.     Resp Syncytial Virus by PCR NEGATIVE NEGATIVE Final    Comment: (NOTE) Fact Sheet for Patients: EntrepreneurPulse.com.au  Fact Sheet for Healthcare Providers: IncredibleEmployment.be  This test is not yet approved or cleared by the [place] FDA and has been authorized for detection and/or diagnosis of SARS-CoV-2 by FDA under an Emergency Use Authorization (EUA). This EUA will remain in effect (meaning this test can be used) for the duration of the COVID-19 declaration under Section 564(b)(1) of the Act, 21 U.S.C. section 360bbb-3(b)(1), unless the authorization is terminated or revoked.  Performed at [facility], [address], [place], [place] [address]   Blood Culture (routine x 2)     Status: None (Preliminary result)   Collection Time: 09/19/22  3:44 AM   Specimen: Right Antecubital; Blood  Result Value Ref Range Status   Specimen Description   Final    RIGHT ANTECUBITAL BOTTLES DRAWN AEROBIC AND ANAEROBIC   Special Requests Blood Culture adequate volume  Final   Culture   Final    NO GROWTH < 24 HOURS Performed at [facility], [address] [place], [place] [address]    Report Status PENDING  Incomplete  MRSA Next Gen by PCR, Nasal     Status: None   Collection Time: 09/19/22  3:36 PM   Specimen: Nasal Mucosa; Nasal Swab  Result Value Ref Range Status   MRSA by PCR Next Gen NOT DETECTED NOT DETECTED Final    Comment: (NOTE) The GeneXpert MRSA Assay (FDA approved for NASAL specimens only), is one component of a comprehensive MRSA colonization surveillance program. It is not intended to diagnose MRSA infection nor to guide or monitor treatment for MRSA infections. Test performance is not FDA approved in patients less than 9 years old. Performed at [facility], [address] [place], [place] [address]          Radiology Studies: ECHOCARDIOGRAM COMPLETE  Result Date: 09/19/2022     ECHOCARDIOGRAM REPORT   Patient Name:   RYANT CALDERAS Date of Exam: 09/19/2022 Medical Rec #:  [id number]     Height:       72.0 in Accession #:    [id number]    Weight:       178.6 lb Date of Birth:  August 26, 1929      BSA:          2.030 m Patient Age:    35 years      BP:           155/76 mmHg Patient Gender: M             HR:           68 bpm. Exam Location:  [facility] Procedure: 2D Echo, Color Doppler and Cardiac Doppler Indications:    elevated troponin  History:        Patient has prior history of Echocardiogram examinations, most                 recent 01/03/2021. CHF, Pacemaker, chronic kidney disease; Risk                 Factors:Hypertension, Diabetes and Dyslipidemia.  Sonographer:    Johny Chess RDCS Referring Phys: [id number] Royanne Foots [facility]  Sonographer Comments: Shaking. IMPRESSIONS  1. Difficult assessment of LV function, consider echocontrast for better evaluation. Grossly appears mildly decreaesd 40-45%. The majority of the hypokinesis appears to be toward the apex where visualizaiton is limited. . Left ventricular ejection fraction, by estimation, is 40 to 45%. The left ventricle has mildly decreased function. Left ventricular endocardial border not optimally defined to evaluate regional wall motion. There is mild left ventricular hypertrophy. Left ventricular diastolic parameters are indeterminate.  2. Right ventricular systolic function is normal. The right ventricular size is normal. There is moderately elevated pulmonary artery systolic pressure.  3. Left atrial size was mildly dilated.  4. The mitral valve is abnormal. Mild mitral valve regurgitation. No evidence of mitral stenosis.  5. The tricuspid valve is abnormal.  6. The aortic valve is tricuspid. There is mild calcification of the aortic valve. There is mild thickening of the aortic valve. Aortic valve regurgitation is not visualized. No aortic stenosis is present.  7. The inferior vena cava is  normal in size with greater than 50%  respiratory variability, suggesting right atrial pressure of 3 mmHg. FINDINGS  Left Ventricle: Difficult assessment of LV function, consider echocontrast for better evaluation. Grossly appears mildly decreaesd 40-45%. The majority of the hypokinesis appears to be toward the apex where visualizaiton is limited. Left ventricular ejection fraction, by estimation, is 40 to 45%. The left ventricle has mildly decreased function. Left ventricular endocardial border not optimally defined to evaluate regional wall motion. The left ventricular internal cavity size was normal in size. There is mild left ventricular hypertrophy. Left ventricular diastolic parameters are indeterminate. Right Ventricle: The right ventricular size is normal. Right vetricular wall thickness was not well visualized. Right ventricular systolic function is normal. There is moderately elevated pulmonary artery systolic pressure. The tricuspid regurgitant velocity is 3.75 m/s, and with an assumed right atrial pressure of 3 mmHg, the estimated right ventricular systolic pressure is [id number]. Left Atrium: Left atrial size was mildly dilated. Right Atrium: Right atrial size was normal in size. Pericardium: There is no evidence of pericardial effusion. Mitral Valve: The mitral valve is abnormal. Mild mitral valve regurgitation. No evidence of mitral valve stenosis. Tricuspid Valve: The tricuspid valve is abnormal. Tricuspid valve regurgitation is mild . No evidence of tricuspid stenosis. Aortic Valve: The aortic valve is tricuspid. There is mild calcification of the aortic valve. There is mild thickening of the aortic valve. There is mild aortic valve annular calcification. Aortic valve regurgitation is not visualized. Aortic regurgitation PHT measures 522 msec. No aortic stenosis is present. Aortic valve mean gradient measures 5.0 mmHg. Aortic valve peak gradient measures 10.2 mmHg. Aortic valve area, by VTI measures 1.56 cm. Pulmonic Valve: The pulmonic  valve was not well visualized. Pulmonic valve regurgitation is mild. No evidence of pulmonic stenosis. Aorta: The aortic root and ascending aorta are structurally normal, with no evidence of dilitation. Venous: The inferior vena cava is normal in size with greater than 50% respiratory variability, suggesting right atrial pressure of 3 mmHg. IAS/Shunts: No atrial level shunt detected by color flow Doppler.  LEFT VENTRICLE PLAX 2D LVIDd:         5.00 cm      Diastology LVIDs:         3.60 cm      LV e' medial:    7.51 cm/s LV PW:         1.10 cm      LV E/e' medial:  13.2 LV IVS:        1.20 cm      LV e' lateral:   12.00 cm/s LVOT diam:     2.20 cm      LV E/e' lateral: 8.3 LV SV:         48 LV SV Index:   24 LVOT Area:     3.80 cm  LV Volumes (MOD) LV vol d, MOD A2C: 113.0 ml LV vol s, MOD A2C: 70.1 ml LV SV MOD A2C:     42.9 ml RIGHT VENTRICLE             IVC RV Basal diam:  3.10 cm     IVC diam: 2.10 cm RV S prime:     10.30 cm/s TAPSE (M-mode): 1.3 cm LEFT ATRIUM             Index        RIGHT ATRIUM           Index LA diam:  4.70 cm 2.31 cm/m   RA Area:     18.40 cm LA Vol (A2C):   98.4 ml 48.46 ml/m  RA Volume:   47.90 ml  23.59 ml/m LA Vol (A4C):   70.8 ml 34.87 ml/m LA Biplane Vol: 83.4 ml 41.08 ml/m  AORTIC VALVE AV Area (Vmax):    1.76 cm AV Area (Vmean):   1.72 cm AV Area (VTI):     1.56 cm AV Vmax:           160.03 cm/s AV Vmean:          102.499 cm/s AV VTI:            0.310 m AV Peak Grad:      10.2 mmHg AV Mean Grad:      5.0 mmHg LVOT Vmax:         74.10 cm/s LVOT Vmean:        46.500 cm/s LVOT VTI:          0.127 m LVOT/AV VTI ratio: 0.41 AI PHT:            522 msec  AORTA Ao Root diam: 3.40 cm Ao Asc diam:  3.40 cm MITRAL VALVE               TRICUSPID VALVE MV Area (PHT): 4.49 cm    TR Peak grad:   56.2 mmHg MV Decel Time: 169 msec    TR Vmax:        375.00 cm/s MV E velocity: 99.20 cm/s MV A velocity: 57.50 cm/s  SHUNTS MV E/A ratio:  1.73        Systemic VTI:  0.13 m                             Systemic Diam: 2.20 cm Carlyle Dolly MD Electronically signed by Carlyle Dolly MD Signature Date/Time: 09/19/2022/3:18:18 PM    Final    CT ABDOMEN PELVIS WO CONTRAST  Result Date: 09/19/2022 CLINICAL DATA:  Fever and abdominal pain. EXAM: CT ABDOMEN AND PELVIS WITHOUT CONTRAST TECHNIQUE: Multidetector CT imaging of the abdomen and pelvis was performed following the standard protocol without IV contrast. RADIATION DOSE REDUCTION: This exam was performed according to the departmental dose-optimization program which includes automated exposure control, adjustment of the mA and/or kV according to patient size and/or use of iterative reconstruction technique. COMPARISON:  Limited comparison available with renal ultrasound 12/07/2013 FINDINGS: Lower chest: There is mild cardiomegaly and a small anterior pericardial effusion. There is metallic artifact from dual lead pacemaker wires in the right heart. There are bilateral minimal layering pleural effusions. There is a calcified granuloma in the right lower lobe. The lung bases are clear of infiltrates. There is a moderate-sized hiatal hernia. Mild gynecomastia. Hepatobiliary: There is loss of fine detail due to breathing motion. In hepatic segment 7 there is a 6 cm cyst, Hounsfield density 544. In segment 4A, there is a 4.9 cm cyst, Hounsfield density of 10.6. Simple cysts were noted in the right lobe of the liver on the prior ultrasound. They were not measured but appear to be similar size. There are additional smaller scattered cysts in both main lobes and a few scattered additional too small to characterize hypodensities. Near the lower tip of segment 6, there is a 2.2 cm low-density lesion above the usual density of fluid, Hounsfield density of 29. This is on 2:31. If clinically warranted taking into account advanced age, MRI  without and with contrast is suggested to assess for solid lesion or complex cystic lesion versus hemangioma. Gallbladder  and bile ducts are unremarkable. Pancreas: No abnormality is seen without contrast. Spleen: No abnormality is seen without contrast. Adrenals/Urinary Tract: There is no adrenal mass. Due to breathing motion the kidneys are not optimally evaluated. There are 2 rounded masses exophytic from the lateral midpole cortex of the right kidney, both measuring above the usual density of fluid, both with Hounsfield density of 34. The more anterior lesion measures 2.7 cm and the more posterior lesion measures 2.4 cm. There is a densely calcified 1.6 cm rounded cortical lesion on the right in the mid to lower pole most likely a calcified cyst. There is a homogeneous cyst in the inferior pole of the right kidney measuring 2.2 cm, Hounsfield density of 8.3, and a homogeneous cyst in the posterior aspect of the left kidney measuring 1.3 cm, Hounsfield density of 4. No urinary stone is seen.  There is no hydronephrosis on the left. There is mild hydroureteronephrosis all the way to the bladder on the right, without visible obstructive abnormality and possibly related to marked distention of the bladder. The bladder dome extends as high as L2. No wall thickening or inflammation is seen. This is possibly due to impression on the bladder base by an enlarged prostate. Stomach/Bowel: No dilatation or wall thickening, including the appendix. There is colonic diverticulosis without focal diverticulitis. Vascular/Lymphatic: Aortic atherosclerosis. No enlarged abdominal or pelvic lymph nodes. Multiple pelvic phleboliths. Reproductive: Enlarged prostate, 5.1 cm transverse with bladder base impression Other: No free air, free hemorrhage, free fluid or incarcerated hernia. Musculoskeletal: Osteopenia and degenerative change lumbar spine. Mild lumbar dextroscoliosis. No acute or other significant osseous findings. IMPRESSION: 1. Motion limited study. 2. Cardiomegaly with small anterior pericardial effusion. Minimal pleural effusions.  Moderate-sized hiatal hernia. 3. Hepatic cysts and additional too small to characterize hypodensities. 4. 2.2 cm low-density lesion in segment 6 of the liver, above the usual density of fluid. If clinically warranted taking into account advanced age, MRI without and with contrast is suggested to assess for solid lesion or complex cystic lesion versus hemangioma. 5. Bilateral renal cysts. 6. 2 rounded masses in the right kidney measuring above the usual density of fluid, measuring 2.7 cm and 2.4 cm. These could be complex cysts or solid masses. MRI without and with contrast recommended, if clinically warranted taking into account age. 7. Mild right hydroureteronephrosis all the way to the bladder, without visible obstructive abnormality. This is possibly related to marked distention of the bladder. 8. Prostatomegaly with bladder base impression. 9. Diverticulosis without evidence of diverticulitis. 10. Aortic atherosclerosis. 11. Osteopenia and degenerative change. Aortic Atherosclerosis (ICD10-I70.0). Electronically Signed   By: Telford Nab M.D.   On: 09/19/2022 01:31   CT HEAD WO CONTRAST (5MM)  Result Date: 09/19/2022 CLINICAL DATA:  [place] EXAM: CT HEAD WITHOUT CONTRAST TECHNIQUE: Contiguous axial images were obtained from the base of the skull through the vertex without intravenous contrast. RADIATION DOSE REDUCTION: This exam was performed according to the departmental dose-optimization program which includes automated exposure control, adjustment of the mA and/or kV according to patient size and/or use of iterative reconstruction technique. COMPARISON:  CT head 01/02/2021 FINDINGS: Brain: Cerebral ventricle sizes are concordant with the degree of cerebral volume loss. Patchy and confluent areas of decreased attenuation are noted throughout the deep and periventricular white matter of the cerebral hemispheres bilaterally, compatible with chronic microvascular ischemic disease. no evidence of  large-territorial acute  infarction. No parenchymal hemorrhage. No mass lesion. No extra-axial collection. No mass effect or midline shift. No hydrocephalus. Basilar cisterns are patent. Vascular: No hyperdense vessel. Atherosclerotic calcifications are present within the cavernous internal carotid arteries. Skull: No acute fracture or focal lesion. Sinuses/Orbits: Paranasal sinuses and mastoid air cells are clear. The orbits are unremarkable. Other: None. IMPRESSION: No acute intracranial abnormality. Electronically Signed   By: Iven Finn M.D.   On: 09/19/2022 01:10   DG Chest Port 1 View  Result Date: 09/19/2022 CLINICAL DATA:  Questionable sepsis.  Evaluate for abnormality. EXAM: PORTABLE CHEST 1 VIEW COMPARISON:  01/03/2021 FINDINGS: Stable cardiomegaly. Aortic atherosclerotic calcification. Left chest wall pacemaker. Patchy bilateral airspace and interstitial opacities suggestive of edema. Pulmonary vascular congestion no definite pleural effusion. No pneumothorax. No displaced rib fractures. Calcified granuloma right lower lung IMPRESSION: Patchy bilateral airspace and interstitial opacities suggestive of edema. Electronically Signed   By: Placido Sou M.D.   On: 09/19/2022 00:58        Scheduled Meds:  Chlorhexidine Gluconate Cloth  6 each Topical Daily   furosemide  40 mg Intravenous Q12H   insulin aspart  0-15 Units Subcutaneous TID WC   insulin aspart  0-5 Units Subcutaneous QHS   isosorbide-hydrALAZINE  1 tablet Oral BID   metoprolol succinate  25 mg Oral Daily   simvastatin  20 mg Oral QHS   sodium chloride flush  3 mL Intravenous Q12H   Continuous Infusions:  sodium chloride     azithromycin 500 mg (09/20/22 0929)   cefTRIAXone (ROCEPHIN)  IV 1 g (09/20/22 1059)   heparin 1,150 Units/hr (09/20/22 0938)     LOS: 1 day    Time spent: 35 minutes    Colvin Blatt D Manuella Ghazi, DO [facility]  If 7PM-7AM, please contact night-coverage [email or web address] 09/20/2022, 11:01 AM

## 2022-09-20 NOTE — Consult Note (Signed)
CARDIOLOGY CONSULT NOTE    Patient ID: JEOFFREY KONE; [id number]; Aug 11, 1929   Admit date: 09/18/2022 Date of Consult: 09/20/2022  Primary Care Provider: Carrolyn Meiers, MD Primary Cardiologist:  Primary Electrophysiologist:     Patient Profile:   Kyle Schultz is a 89 y.o. male with a hx of HFrEF, HTN, permanent A-fib with PPM on Eliquis, CKD stage IV, DM 2, dementia who is being seen today for the evaluation of acute systolic heart failure exacerbation at the request of Dr. Manuella Ghazi.  History of Present Illness:   Kyle Schultz is a 89 year old M known to have persistent A-fib/atrial flutter and CHB s/p [facility] Jude PPM, cardiomyopathy LVEF 40 to 45% in 09/2022, CKD stage IV, DM 2, dementia reported that he was brought to the hospital by his son as he was feeling sick. He is unable to tell me exactly but after asking close ended questions, he stated that he was feeling short of breath, not able to sleep good in the night and has no leg swelling. No other symptoms like chest pain, dizziness, lightheadedness, syncope.  High-sensitivity troponins were mildly elevated, 140, 203, 328, EKG showed medically paced rhythm. Patient was started on antibiotics in the ER for suspicion of possible pneumonia and low-grade fevers. He was also started on heparin drip for possible ACS.  Past Medical History:  Diagnosis Date   Atrial fibrillation and flutter (HCC)    Cardiomyopathy ([place])    CKD (chronic kidney disease) stage 3, GFR 30-59 ml/min (HCC)    Complete heart block (HCC)    Diabetes mellitus    Hyperlipidemia    Hypertension    Pacemaker    Sinus bradycardia     Past Surgical History:  Procedure Laterality Date   COLONOSCOPY  2010   Two 4 mm sessile hepatic flexure polyps, one slightly pedunculated 6 to 8 mm sessile descending colon polyp, small internal hemorrhoids. Simple adenomas.    colonscopy     HEMORRHOID SURGERY     PACEMAKER INSERTION  [address] jude   PPM GENERATOR CHANGEOUT  N/A 09/27/2019   Procedure: PPM GENERATOR CHANGEOUT;  Surgeon: Evans Lance, MD;  Location: [facility];  Service: Cardiovascular;  Laterality: N/A;   UMBILICAL HERNIA REPAIR  04/2008    Inpatient Medications: Scheduled Meds:  Chlorhexidine Gluconate Cloth  6 each Topical Daily   furosemide  40 mg Intravenous Q12H   insulin aspart  0-15 Units Subcutaneous TID WC   insulin aspart  0-5 Units Subcutaneous QHS   isosorbide-hydrALAZINE  1 tablet Oral BID   metoprolol succinate  25 mg Oral Daily   simvastatin  20 mg Oral QHS   sodium chloride flush  3 mL Intravenous Q12H   Continuous Infusions:  sodium chloride     azithromycin 500 mg (09/20/22 0929)   cefTRIAXone (ROCEPHIN)  IV 1 g (09/20/22 1059)   heparin 1,150 Units/hr (09/20/22 0938)   PRN Meds: sodium chloride, acetaminophen **OR** acetaminophen, docusate sodium, haloperidol lactate, OLANZapine, ondansetron **OR** ondansetron (ZOFRAN) IV, sodium chloride flush  Allergies:    Allergies  Allergen Reactions   Other     "some kind of fluid pill I can't take"     Social History:   Social History   Socioeconomic History   Marital status: Widowed    Spouse name: Not on file   Number of children: Not on file   Years of education: Not on file   Highest education level: Not on file  Occupational History   Occupation: Retired, Optometrist Tobacco  Tobacco Use   Smoking status: Never   Smokeless tobacco: Never  Vaping Use   Vaping Use: Never used  Substance and Sexual Activity   Alcohol use: No    Alcohol/week: 0.0 standard drinks of alcohol   Drug use: No   Sexual activity: Never    Birth control/protection: Abstinence  Other Topics Concern   Not on file  Social History Narrative   Widowed   1 child   Walks daily   Social Determinants of Health   Financial Resource Strain: Not on file  Food Insecurity: Not on file  Transportation Needs: Not on file  Physical Activity: Not on file  Stress: Not on file   Social Connections: Not on file  Intimate Partner Violence: Not on file    Family History:    Family History  Problem Relation Age of Onset   Heart disease Father    Cancer Sister    Cancer Sister    Cancer Sister    ALS Sister    Colon cancer Neg Hx      ROS:  Please see the history of present illness.  ROS  All other ROS reviewed and negative.     Physical Exam/Data:   Vitals:   09/20/22 0300 09/20/22 0600 09/20/22 0800 09/20/22 1007  BP: (!) 128/46  (!) 158/53 111/63  Pulse: 60  63 65  Resp: '18  19 18  '$ Temp:  98.5 F (36.9 C) (!) 100.8 F (38.2 C)   TempSrc:  Oral Oral   SpO2: 100%  100% 100%  Weight:  87.8 kg    Height:        Intake/Output Summary (Last 24 hours) at 09/20/2022 1131 Last data filed at 09/20/2022 0600 Gross per 24 hour  Intake 1122.77 ml  Output 3600 ml  Net -2477.23 ml   Filed Weights   09/18/22 2348 09/19/22 1712 09/20/22 0600  Weight: 81 kg 86.9 kg 87.8 kg   Body mass index is 26.25 kg/m.  General:  Well nourished, well developed, in acute distress HEENT: normal Lymph: no adenopathy Neck: no JVD Endocrine:  No thryomegaly Vascular: No carotid bruits; FA pulses 2+ bilaterally without bruits  Cardiac:  normal S1, S2; RRR; no murmur Lungs:  clear to auscultation bilaterally, no wheezing, rhonchi or rales  Abd: soft, nontender, no hepatomegaly  Ext: no edema Musculoskeletal:  No deformities, BUE and BLE strength normal and equal Skin: warm and dry  Neuro:  CNs 2-12 intact, no focal abnormalities noted Psych:  Normal affect   EKG:  The EKG was personally reviewed and demonstrates: V paced rhythm Telemetry:  Telemetry was personally reviewed and demonstrates: V paced rhythm  Relevant CV Studies:   Laboratory Data:  Chemistry Recent Labs  Lab 09/19/22 0043 09/20/22 0320  NA 138 138  K 3.9 3.8  CL 107 106  CO2 19* 20*  GLUCOSE 178* 162*  BUN 40* 40*  CREATININE 2.13* 1.96*  CALCIUM 9.2 8.9  GFRNONAA 29* 32*  ANIONGAP  12 12    Recent Labs  Lab 09/19/22 0043  PROT 7.4  ALBUMIN 3.4*  AST 25  ALT 14  ALKPHOS 45  BILITOT 1.2   Hematology Recent Labs  Lab 09/19/22 0043 09/20/22 0320  WBC 8.0 6.9  RBC 3.83* 3.70*  HGB 9.7* 9.3*  HCT 31.3* 30.1*  MCV 81.7 81.4  MCH 25.3* 25.1*  MCHC 31.0 30.9  RDW 16.1* 15.9*  PLT 185 167  Cardiac EnzymesNo results for input(s): "TROPONINI" in the last 168 hours. No results for input(s): "TROPIPOC" in the last 168 hours.  BNPNo results for input(s): "BNP", "PROBNP" in the last 168 hours.  DDimer No results for input(s): "DDIMER" in the last 168 hours.  Radiology/Studies:  ECHOCARDIOGRAM COMPLETE  Result Date: 09/19/2022    ECHOCARDIOGRAM REPORT   Patient Name:   MYSHAUN HOLDT Date of Exam: 09/19/2022 Medical Rec #:  [id number]     Height:       72.0 in Accession #:    [id number]    Weight:       178.6 lb Date of Birth:  1930-03-10      BSA:          2.030 m Patient Age:    4 years      BP:           155/76 mmHg Patient Gender: M             HR:           68 bpm. Exam Location:  [facility] Procedure: 2D Echo, Color Doppler and Cardiac Doppler Indications:    elevated troponin  History:        Patient has prior history of Echocardiogram examinations, most                 recent 01/03/2021. CHF, Pacemaker, chronic kidney disease; Risk                 Factors:Hypertension, Diabetes and Dyslipidemia.  Sonographer:    Johny Chess RDCS Referring Phys: [id number] Royanne Foots [facility]  Sonographer Comments: Shaking. IMPRESSIONS  1. Difficult assessment of LV function, consider echocontrast for better evaluation. Grossly appears mildly decreaesd 40-45%. The majority of the hypokinesis appears to be toward the apex where visualizaiton is limited. . Left ventricular ejection fraction, by estimation, is 40 to 45%. The left ventricle has mildly decreased function. Left ventricular endocardial border not optimally defined to evaluate regional wall motion. There is mild left ventricular  hypertrophy. Left ventricular diastolic parameters are indeterminate.  2. Right ventricular systolic function is normal. The right ventricular size is normal. There is moderately elevated pulmonary artery systolic pressure.  3. Left atrial size was mildly dilated.  4. The mitral valve is abnormal. Mild mitral valve regurgitation. No evidence of mitral stenosis.  5. The tricuspid valve is abnormal.  6. The aortic valve is tricuspid. There is mild calcification of the aortic valve. There is mild thickening of the aortic valve. Aortic valve regurgitation is not visualized. No aortic stenosis is present.  7. The inferior vena cava is normal in size with greater than 50% respiratory variability, suggesting right atrial pressure of 3 mmHg. FINDINGS  Left Ventricle: Difficult assessment of LV function, consider echocontrast for better evaluation. Grossly appears mildly decreaesd 40-45%. The majority of the hypokinesis appears to be toward the apex where visualizaiton is limited. Left ventricular ejection fraction, by estimation, is 40 to 45%. The left ventricle has mildly decreased function. Left ventricular endocardial border not optimally defined to evaluate regional wall motion. The left ventricular internal cavity size was normal in size. There is mild left ventricular hypertrophy. Left ventricular diastolic parameters are indeterminate. Right Ventricle: The right ventricular size is normal. Right vetricular wall thickness was not well visualized. Right ventricular systolic function is normal. There is moderately elevated pulmonary artery systolic pressure. The tricuspid regurgitant velocity is 3.75 m/s, and with an assumed right atrial pressure of 3 mmHg, the  estimated right ventricular systolic pressure is [id number]. Left Atrium: Left atrial size was mildly dilated. Right Atrium: Right atrial size was normal in size. Pericardium: There is no evidence of pericardial effusion. Mitral Valve: The mitral valve is  abnormal. Mild mitral valve regurgitation. No evidence of mitral valve stenosis. Tricuspid Valve: The tricuspid valve is abnormal. Tricuspid valve regurgitation is mild . No evidence of tricuspid stenosis. Aortic Valve: The aortic valve is tricuspid. There is mild calcification of the aortic valve. There is mild thickening of the aortic valve. There is mild aortic valve annular calcification. Aortic valve regurgitation is not visualized. Aortic regurgitation PHT measures 522 msec. No aortic stenosis is present. Aortic valve mean gradient measures 5.0 mmHg. Aortic valve peak gradient measures 10.2 mmHg. Aortic valve area, by VTI measures 1.56 cm. Pulmonic Valve: The pulmonic valve was not well visualized. Pulmonic valve regurgitation is mild. No evidence of pulmonic stenosis. Aorta: The aortic root and ascending aorta are structurally normal, with no evidence of dilitation. Venous: The inferior vena cava is normal in size with greater than 50% respiratory variability, suggesting right atrial pressure of 3 mmHg. IAS/Shunts: No atrial level shunt detected by color flow Doppler.  LEFT VENTRICLE PLAX 2D LVIDd:         5.00 cm      Diastology LVIDs:         3.60 cm      LV e' medial:    7.51 cm/s LV PW:         1.10 cm      LV E/e' medial:  13.2 LV IVS:        1.20 cm      LV e' lateral:   12.00 cm/s LVOT diam:     2.20 cm      LV E/e' lateral: 8.3 LV SV:         48 LV SV Index:   24 LVOT Area:     3.80 cm  LV Volumes (MOD) LV vol d, MOD A2C: 113.0 ml LV vol s, MOD A2C: 70.1 ml LV SV MOD A2C:     42.9 ml RIGHT VENTRICLE             IVC RV Basal diam:  3.10 cm     IVC diam: 2.10 cm RV S prime:     10.30 cm/s TAPSE (M-mode): 1.3 cm LEFT ATRIUM             Index        RIGHT ATRIUM           Index LA diam:        4.70 cm 2.31 cm/m   RA Area:     18.40 cm LA Vol (A2C):   98.4 ml 48.46 ml/m  RA Volume:   47.90 ml  23.59 ml/m LA Vol (A4C):   70.8 ml 34.87 ml/m LA Biplane Vol: 83.4 ml 41.08 ml/m  AORTIC VALVE AV Area  (Vmax):    1.76 cm AV Area (Vmean):   1.72 cm AV Area (VTI):     1.56 cm AV Vmax:           160.03 cm/s AV Vmean:          102.499 cm/s AV VTI:            0.310 m AV Peak Grad:      10.2 mmHg AV Mean Grad:      5.0 mmHg LVOT Vmax:  74.10 cm/s LVOT Vmean:        46.500 cm/s LVOT VTI:          0.127 m LVOT/AV VTI ratio: 0.41 AI PHT:            522 msec  AORTA Ao Root diam: 3.40 cm Ao Asc diam:  3.40 cm MITRAL VALVE               TRICUSPID VALVE MV Area (PHT): 4.49 cm    TR Peak grad:   56.2 mmHg MV Decel Time: 169 msec    TR Vmax:        375.00 cm/s MV E velocity: 99.20 cm/s MV A velocity: 57.50 cm/s  SHUNTS MV E/A ratio:  1.73        Systemic VTI:  0.13 m                            Systemic Diam: 2.20 cm Carlyle Dolly MD Electronically signed by Carlyle Dolly MD Signature Date/Time: 09/19/2022/3:18:18 PM    Final    CT ABDOMEN PELVIS WO CONTRAST  Result Date: 09/19/2022 CLINICAL DATA:  Fever and abdominal pain. EXAM: CT ABDOMEN AND PELVIS WITHOUT CONTRAST TECHNIQUE: Multidetector CT imaging of the abdomen and pelvis was performed following the standard protocol without IV contrast. RADIATION DOSE REDUCTION: This exam was performed according to the departmental dose-optimization program which includes automated exposure control, adjustment of the mA and/or kV according to patient size and/or use of iterative reconstruction technique. COMPARISON:  Limited comparison available with renal ultrasound 12/07/2013 FINDINGS: Lower chest: There is mild cardiomegaly and a small anterior pericardial effusion. There is metallic artifact from dual lead pacemaker wires in the right heart. There are bilateral minimal layering pleural effusions. There is a calcified granuloma in the right lower lobe. The lung bases are clear of infiltrates. There is a moderate-sized hiatal hernia. Mild gynecomastia. Hepatobiliary: There is loss of fine detail due to breathing motion. In hepatic segment 7 there is a 6 cm cyst,  Hounsfield density 544. In segment 4A, there is a 4.9 cm cyst, Hounsfield density of 10.6. Simple cysts were noted in the right lobe of the liver on the prior ultrasound. They were not measured but appear to be similar size. There are additional smaller scattered cysts in both main lobes and a few scattered additional too small to characterize hypodensities. Near the lower tip of segment 6, there is a 2.2 cm low-density lesion above the usual density of fluid, Hounsfield density of 29. This is on 2:31. If clinically warranted taking into account advanced age, MRI without and with contrast is suggested to assess for solid lesion or complex cystic lesion versus hemangioma. Gallbladder and bile ducts are unremarkable. Pancreas: No abnormality is seen without contrast. Spleen: No abnormality is seen without contrast. Adrenals/Urinary Tract: There is no adrenal mass. Due to breathing motion the kidneys are not optimally evaluated. There are 2 rounded masses exophytic from the lateral midpole cortex of the right kidney, both measuring above the usual density of fluid, both with Hounsfield density of 34. The more anterior lesion measures 2.7 cm and the more posterior lesion measures 2.4 cm. There is a densely calcified 1.6 cm rounded cortical lesion on the right in the mid to lower pole most likely a calcified cyst. There is a homogeneous cyst in the inferior pole of the right kidney measuring 2.2 cm, Hounsfield density of 8.3, and a homogeneous cyst in the  posterior aspect of the left kidney measuring 1.3 cm, Hounsfield density of 4. No urinary stone is seen.  There is no hydronephrosis on the left. There is mild hydroureteronephrosis all the way to the bladder on the right, without visible obstructive abnormality and possibly related to marked distention of the bladder. The bladder dome extends as high as L2. No wall thickening or inflammation is seen. This is possibly due to impression on the bladder base by an enlarged  prostate. Stomach/Bowel: No dilatation or wall thickening, including the appendix. There is colonic diverticulosis without focal diverticulitis. Vascular/Lymphatic: Aortic atherosclerosis. No enlarged abdominal or pelvic lymph nodes. Multiple pelvic phleboliths. Reproductive: Enlarged prostate, 5.1 cm transverse with bladder base impression Other: No free air, free hemorrhage, free fluid or incarcerated hernia. Musculoskeletal: Osteopenia and degenerative change lumbar spine. Mild lumbar dextroscoliosis. No acute or other significant osseous findings. IMPRESSION: 1. Motion limited study. 2. Cardiomegaly with small anterior pericardial effusion. Minimal pleural effusions. Moderate-sized hiatal hernia. 3. Hepatic cysts and additional too small to characterize hypodensities. 4. 2.2 cm low-density lesion in segment 6 of the liver, above the usual density of fluid. If clinically warranted taking into account advanced age, MRI without and with contrast is suggested to assess for solid lesion or complex cystic lesion versus hemangioma. 5. Bilateral renal cysts. 6. 2 rounded masses in the right kidney measuring above the usual density of fluid, measuring 2.7 cm and 2.4 cm. These could be complex cysts or solid masses. MRI without and with contrast recommended, if clinically warranted taking into account age. 7. Mild right hydroureteronephrosis all the way to the bladder, without visible obstructive abnormality. This is possibly related to marked distention of the bladder. 8. Prostatomegaly with bladder base impression. 9. Diverticulosis without evidence of diverticulitis. 10. Aortic atherosclerosis. 11. Osteopenia and degenerative change. Aortic Atherosclerosis (ICD10-I70.0). Electronically Signed   By: Telford Nab M.D.   On: 09/19/2022 01:31   CT HEAD WO CONTRAST (5MM)  Result Date: 09/19/2022 CLINICAL DATA:  Delirium EXAM: CT HEAD WITHOUT CONTRAST TECHNIQUE: Contiguous axial images were obtained from the base of  the skull through the vertex without intravenous contrast. RADIATION DOSE REDUCTION: This exam was performed according to the departmental dose-optimization program which includes automated exposure control, adjustment of the mA and/or kV according to patient size and/or use of iterative reconstruction technique. COMPARISON:  CT head 01/02/2021 FINDINGS: Brain: Cerebral ventricle sizes are concordant with the degree of cerebral volume loss. Patchy and confluent areas of decreased attenuation are noted throughout the deep and periventricular white matter of the cerebral hemispheres bilaterally, compatible with chronic microvascular ischemic disease. no evidence of large-territorial acute infarction. No parenchymal hemorrhage. No mass lesion. No extra-axial collection. No mass effect or midline shift. No hydrocephalus. Basilar cisterns are patent. Vascular: No hyperdense vessel. Atherosclerotic calcifications are present within the cavernous internal carotid arteries. Skull: No acute fracture or focal lesion. Sinuses/Orbits: Paranasal sinuses and mastoid air cells are clear. The orbits are unremarkable. Other: None. IMPRESSION: No acute intracranial abnormality. Electronically Signed   By: Iven Finn M.D.   On: 09/19/2022 01:10   DG Chest Port 1 View  Result Date: 09/19/2022 CLINICAL DATA:  Questionable sepsis.  Evaluate for abnormality. EXAM: PORTABLE CHEST 1 VIEW COMPARISON:  01/03/2021 FINDINGS: Stable cardiomegaly. Aortic atherosclerotic calcification. Left chest wall pacemaker. Patchy bilateral airspace and interstitial opacities suggestive of edema. Pulmonary vascular congestion no definite pleural effusion. No pneumothorax. No displaced rib fractures. Calcified granuloma right lower lung IMPRESSION: Patchy bilateral airspace and interstitial opacities  suggestive of edema. Electronically Signed   By: Placido Sou M.D.   On: 09/19/2022 00:58    Assessment and Plan:   Patient is a 89 year old M  known to have persistent A-fib/atrial flutter and CHB s/p [facility] PPM, cardiomyopathy LVEF 40 to 45% in 09/2022, CKD stage IV, DM 2, dementia is currently admitted to hospital for management of acute systolic heart failure exacerbation and pneumonia.  # Acute systolic heart failure exacerbation, LVEF 40 to 45% -Increase IV Lasix from twice daily to three times daily -Continue metoprolol succinate 25 mg once daily -Start Entresto 24-26 mg twice daily (low-dose due to CKD) -Continue BiDil 20-37.5 mg twice daily -Daily weight, in and out, 1.2L fluid restriction -Ischemia version was deferred due to advanced age and CKD.  Outpatient cardiology follow-up.  # Acute myocardial injury with no evidence of myocardial infarction -Likely secondary demand ischemia from heart failure exacerbation and pneumonia. EKG showed V paced rhythm. No indication of heparin drip.  # Community-acquired pneumonia -Continue antibiotics per primary team  # A-fib/atrial flutter and CHB s/p [facility] PPM -Continue metoprolol succinate 25 mg once daily -Switch heparin drip to Eliquis 2.5 mg twice daily  I have spent a total of 70 minutes with patient reviewing chart , telemetry, EKGs, labs and examining patient as well as establishing an assessment and plan that was discussed with the patient.  > 50% of time was spent in direct patient care.   '   For questions or updates, please contact [place] Please consult [email or web address] for contact info under Cardiology/STEMI.   Signed, Vangie Bicker, MD 09/20/2022 11:31 AM

## 2022-09-20 NOTE — TOC Benefit Eligibility Note (Signed)
Patient Teacher, English as a foreign language completed.    The patient is currently admitted and upon discharge could be taking Entresto 24-26 mg.  The current 30 day co-pay is $482.00 due to a $435.00 deductible.  Will be $47.00 once deductible is met.   The patient is insured through [place], [place] Patient Advocate Specialist [place] Patient Advocate Team Direct Number: [phone]  Fax: [phone]

## 2022-09-20 NOTE — TOC Progression Note (Signed)
  Transition of Care [facility]) Screening Note   Patient Details  Name: MAHLIK LENN Date of Birth: 09/09/1929   Transition of Care [facility]) CM/SW Contact:    Boneta Lucks, RN Phone Number: 09/20/2022, 5:21 PM    Transition of Care Department [facility]) has reviewed patient and no TOC needs have been identified at this time. We will continue to monitor patient advancement through interdisciplinary progression rounds. If new patient transition needs arise, please place a TOC consult.    Social Determinants of Health (SDOH) Interventions SDOH Screenings   Tobacco Use: Low Risk  (07/27/2022)

## 2022-09-20 NOTE — Consult Note (Signed)
Palliative Care Consult Note                                  Date: 09/20/2022   Patient Name: Kyle Schultz  DOB: 04-08-1930  MRN: [id number]  Age / Sex: 89 y.o., male  PCP: Kyle Meiers, MD Referring Physician: Rodena Goldmann, DO  Reason for Consultation: {Reason for Consult:23484}  HPI/Patient Profile: 89 y.o. male  with past medical history of *** admitted on 09/18/2022 with ***.   Past Medical History:  Diagnosis Date   Atrial fibrillation and flutter (HCC)    Cardiomyopathy ([place])    CKD (chronic kidney disease) stage 3, GFR 30-59 ml/min (HCC)    Complete heart block (HCC)    Diabetes mellitus    Hyperlipidemia    Hypertension    Pacemaker    Sinus bradycardia     Subjective:   This NP Kyle Schultz reviewed medical records, received report from team, assessed the patient and then meet at the patient's bedside to discuss diagnosis, prognosis, GOC, EOL wishes disposition and options.  I met with ***.   Concept of Palliative Care was introduced as specialized medical care for people and their families living with serious illness.  If focuses on providing relief from the symptoms and stress of a serious illness.  The goal is to improve quality of life for both the patient and the family. Values and goals of care important to patient and family were attempted to be elicited.  Created space and opportunity for patient  and family to explore thoughts and feelings regarding current medical situation   Natural trajectory and current clinical status were discussed. Questions and concerns addressed. Patient  encouraged to call with questions or concerns.    Patient/Family Understanding of Illness: ***  Life Review: ***  Patient Values: ***  Goals: ***  Today's Discussion: ***  Review of Systems  Objective:   Primary Diagnoses: Present on Admission:  NSTEMI (non-ST elevated myocardial infarction) ([place])  Acute  on chronic combined systolic and diastolic CHF (congestive heart failure) /EF 35 to 40 %  CKD (chronic kidney disease), stage IV (HCC)  Dyslipidemia  Essential hypertension, benign  Mixed hyperlipidemia  Type 2 diabetes mellitus with other circulatory complications (HCC)   Physical Exam  Vital Signs:  BP (!) 135/54   Pulse 64   Temp 98.2 F (36.8 C) (Oral)   Resp 20   Ht 6' (1.829 m)   Wt 87.8 kg   SpO2 100%   BMI 26.25 kg/m   Palliative Assessment/Data: ***    Advanced Care Planning:   Existing Vynca/ACP Documentation: ***  Primary Decision Maker: {Primary Decision [id number]  Code Status/Advance Care Planning: {Palliative Code status:23503}  A discussion was had today regarding advanced directives. Concepts specific to code status, artifical feeding and hydration, continued IV antibiotics and rehospitalization was had.  The difference between a aggressive medical intervention path and a palliative comfort care path for this patient at this time was had. ***The MOST form was introduced and discussed.***  Decisions/Changes to ACP: ***  Assessment & Plan:   Impression: ***  SUMMARY OF RECOMMENDATIONS   ***  Symptom Management:  ***  Prognosis:  {Palliative Care Prognosis:23504}  Discharge Planning:  {Palliative dispostion:23505}   Discussed with: ***    Thank you for allowing [place] to participate in the care of Kyle Schultz PMT will continue to support holistically.  Time  Total: ***  Greater than 50%  of this time was spent counseling and coordinating care related to the above assessment and plan.  Signed by: Kyle Field, NP Palliative Medicine Team  Team Phone # [phone] (Nights/Weekends)  09/20/2022, 3:44 PM

## 2022-09-21 DIAGNOSIS — Z7189 Other specified counseling: Secondary | ICD-10-CM | POA: Diagnosis not present

## 2022-09-21 DIAGNOSIS — I5021 Acute systolic (congestive) heart failure: Secondary | ICD-10-CM

## 2022-09-21 DIAGNOSIS — G8929 Other chronic pain: Secondary | ICD-10-CM

## 2022-09-21 DIAGNOSIS — M545 Low back pain, unspecified: Secondary | ICD-10-CM | POA: Diagnosis not present

## 2022-09-21 DIAGNOSIS — Z515 Encounter for palliative care: Secondary | ICD-10-CM | POA: Diagnosis not present

## 2022-09-21 DIAGNOSIS — I214 Non-ST elevation (NSTEMI) myocardial infarction: Secondary | ICD-10-CM | POA: Diagnosis not present

## 2022-09-21 DIAGNOSIS — I4892 Unspecified atrial flutter: Secondary | ICD-10-CM

## 2022-09-21 LAB — BASIC METABOLIC PANEL
Anion gap: 12 (ref 5–15)
BUN: 50 mg/dL — ABNORMAL HIGH (ref 8–23)
CO2: 23 mmol/L (ref 22–32)
Calcium: 9 mg/dL (ref 8.9–10.3)
Chloride: 103 mmol/L (ref 98–111)
Creatinine, Ser: 2.08 mg/dL — ABNORMAL HIGH (ref 0.61–1.24)
GFR, Estimated: 30 mL/min — ABNORMAL LOW (ref >60)
Glucose, Bld: 209 mg/dL — ABNORMAL HIGH (ref 70–99)
Potassium: 3.7 mmol/L (ref 3.5–5.1)
Sodium: 138 mmol/L (ref 135–145)

## 2022-09-21 LAB — GLUCOSE, CAPILLARY
Glucose-Capillary: 189 mg/dL — ABNORMAL HIGH (ref 70–99)
Glucose-Capillary: 211 mg/dL — ABNORMAL HIGH (ref 70–99)
Glucose-Capillary: 173 mg/dL — ABNORMAL HIGH (ref 70–99)
Glucose-Capillary: 241 mg/dL — ABNORMAL HIGH (ref 70–99)

## 2022-09-21 LAB — MAGNESIUM: Magnesium: 2 mg/dL (ref 1.7–2.4)

## 2022-09-21 MED ORDER — MUSCLE RUB 10-15 % EX CREA
TOPICAL_CREAM | CUTANEOUS | Status: DC | PRN
  Filled 2022-09-21: qty 85

## 2022-09-21 MED ORDER — FUROSEMIDE 40 MG PO TABS
40.0000 mg | ORAL_TABLET | Freq: Two times a day (BID) | ORAL | Status: DC
  Administered 2022-09-21 – 2022-09-24 (×6): 40 mg via ORAL
  Filled 2022-09-21 (×6): qty 1

## 2022-09-21 NOTE — Progress Notes (Signed)
Daily Progress Note   Patient Name: Kyle Schultz       Date: 09/21/2022 DOB: 07-18-1929  Age: 89 y.o. MRN#: [id number] Attending Physician: Kyle Goldmann, DO Primary Care Physician: Kyle Meiers, MD Admit Date: 09/18/2022 Length of Stay: 2 days  Reason for Consultation/Follow-up: Establishing goals of care  HPI/Patient Profile: 89 y.o. male  with past medical history of combined systolic and diastolic CHF, hypertension, permanent atrial fibrillation with cardiac pacemaker on Eliquis, CKD stage IV, type 2 diabetes, and dementia who presented to the ED with complaints of some low back pain.  He was admitted on 09/18/2022 with acute on chronic combined systolic and diastolic CHF, rule out NSTEMI, fever with possible CAP, liver and kidney lesions, and others.    PMT was consulted for Kyle Schultz conversations. No previous palliative care involvement.   Subjective:   Subjective: Chart Reviewed. Updates received. Patient Assessed. Created space and opportunity for patient  and family to explore thoughts and feelings regarding current medical situation.  Today's Discussion: Today I saw the patient at the bedside, the nurse was present attempting to get him better situated in the bed.  Also present at the bedside with 2 sisters and his friend.  The patient's son was not present.  I informed him that I had tried to call the patient's son, unsuccessfully.  The patient's sister states that she has been trying to contact him since Saturday and has not had any luck.  Today the patient is more awake, more oriented.  He seems somewhat improved compared to yesterday.  They tell me that they know he is here because his heart and kidneys are not good.  We had further discussion on his heart failure with acute exacerbation, CKD 4, and pneumonia.  We discussed all of these interplay with each other to make a very difficult situation.  We discussed whether the patient has had that conversation with his family  about his wishes.  They state that they have not talked about this.  We discussed that the patient's legal next of kin is his son and so we would not make any changes to his goals until we can contact his son, unless he is deemed to be reasonable to be unavailable.  I provided copies of the book "hard choices for loving people" as well as contact information for the palliative team to all family present.  I told him I would continue to try to contact the patient's son.  At this time we will continue full code, full scope of care unless the patient or the family decide otherwise.  Examined the  Review of Systems  Constitutional:  Positive for fatigue.  Respiratory:  Negative for shortness of breath.   Gastrointestinal:  Negative for abdominal pain, nausea and vomiting.    Objective:   Vital Signs:  BP 137/63 (BP Location: Right Arm)   Pulse 62   Temp 98.6 F (37 C) (Oral)   Resp 19   Ht 6' (1.829 m)   Wt 83.3 kg   SpO2 97%   BMI 24.91 kg/m   Physical Exam: Physical Exam Vitals and nursing note reviewed.  Constitutional:      General: He is sleeping. He is not in acute distress. HENT:     Head: Normocephalic and atraumatic.  Cardiovascular:     Rate and Rhythm: Normal rate.  Pulmonary:     Effort: Pulmonary effort is normal. No respiratory distress.  Abdominal:     General: Abdomen is flat.  Palpations: Abdomen is soft.  Skin:    General: Skin is warm and dry.  Neurological:     Mental Status: He is easily aroused. He is confused.     Comments: Confusion improved since yesterday  Psychiatric:        Mood and Affect: Mood normal.        Behavior: Behavior normal.     Palliative Assessment/Data: 20-30%    Existing Vynca/ACP Documentation: None  Assessment & Plan:   Impression: Present on Admission:  NSTEMI (non-ST elevated myocardial infarction) (HCC)  Acute on chronic combined systolic and diastolic CHF (congestive heart failure) /EF 35 to 40 %  CKD  (chronic kidney disease), stage IV (HCC)  Dyslipidemia  Essential hypertension, benign  Mixed hyperlipidemia  Type 2 diabetes mellitus with other circulatory complications ([place])  SUMMARY OF RECOMMENDATIONS   Remain full code Full scope of care Continue attempts to contact patient's son PMT will continue to follow  Symptom Management:  Per primary team PMT is available to assist as needed  Code Status: Full code  Prognosis: Unable to determine  Discharge Planning: To Be Determined  Discussed with: Patient, patient's family, medical team, nursing team  Thank you for allowing [place] to participate in the care of Kyle Schultz PMT will continue to support holistically.  Time Total: 40 min  Visit consisted of counseling and education dealing with the complex and emotionally intense issues of symptom management and palliative care in the setting of serious and potentially life-threatening illness. Greater than 50%  of this time was spent counseling and coordinating care related to the above assessment and plan.  Kyle Field, NP Palliative Medicine Team  Team Phone # [phone] (Nights/Weekends)  03/10/2021, 8:17 AM

## 2022-09-21 NOTE — Inpatient Diabetes Management (Addendum)
Inpatient Diabetes Program Recommendations  AACE/ADA: New Consensus Statement on Inpatient Glycemic Control  Target Ranges:  Prepandial:   less than 140 mg/dL      Peak postprandial:   less than 180 mg/dL (1-2 hours)      Critically ill patients:  140 - 180 mg/dL    Latest Reference Range & Units 09/20/22 08:17 09/20/22 11:31 09/20/22 16:05 09/20/22 20:14 09/21/22 07:25  Glucose-Capillary 70 - 99 mg/dL 164 (H) 160 (H) 171 (H) 244 (H) 189 (H)    Review of Glycemic Control  Diabetes history: DM2 Outpatient Diabetes medications: Amaryl 2 mg QAM, Glucotrol 5 mg BID, Tradjenta 5 mg daily (not taking) Current orders for Inpatient glycemic control: Novolog 0-15 units TID with meals, Novolog 0-5 units QHS  Inpatient Diabetes Program Recommendations:    Outpatient DM medications: Noted patient has Amaryl and Glucotrol on home medication list with both noted to be last given on 09/19/22 and Tradjenta with notation patient is not taking. Appears patient is getting Amaryl from [facility] and [facility] from [facility].   Since Amaryl and Glucotrol are in same drug class, would recommend discontinuing one of them and have patient follow up with PCP.  NOTE: Spoke with patient and wife at bedside. Patient states Dr. Claud Kelp his DM and he takes DM medications but not sure of the names of them. Patient's wife is able to verify the takes Glipizide but not sure about glimepiride or Tradjenta. She states that they get medications filled at [facility] and [place]. Patient's wife states that patient also has some insulin at home to use if glucose is over 200 mg/dl; she is not sure of the name of the insulin or dose. She states that patient does not have to use the insulin very often.  She states that she checks patient's glucose every morning and it can range from 90-200's mg/dl. She remarks that is it most commonly in the 100's but if patient eats a snack late at night then it is higher in the  mornings. Patient and wife state that patient does not have any issues with hypoglycemia. Patient reports that the lowest she has seen his sugar is around 80 mg/dl but patient did not feel symptomatic with CBG of 80 mg/dl. Discussed that patient has 2 sulfonylurea medications on his home med list. Informed patient and wife that I would call patient's pharmacy to try to see which medications are being filled. Patient and wife verbalized understanding of information discussed and has no questions at this time. [place] and was told that patient has Glipizide 5 mg BID filled there; they have Rx for Tradjenta 5 mg daily but it has never been filled and for glimepiride that was last filled in 2022.  [place] and told that patient gets glimepiride filled there and no other DM medications (oral nor insulin) listed.    Thanks, Barnie Alderman, RN, MSN, [facility] [facility] Coordinator Inpatient Diabetes Program [phone] (Team Pager from 8am to [place])

## 2022-09-21 NOTE — Care Management Important Message (Signed)
Important Message  Patient Details  Name: Kyle Schultz MRN: [id number] Date of Birth: [birth year]   Medicare Important Message Given:  N/A - LOS <3 / Initial given by admissions     Tommy Medal 09/21/2022, 9:51 AM

## 2022-09-21 NOTE — TOC Transition Note (Signed)
Transition of Care [facility]) - CM/SW Discharge Note   Patient Details  Name: LARENCE RABE MRN: [id number] Date of Birth: 05/13/1930  [facility]) CM/SW Contact:  Boneta Lucks, RN Phone Number: 09/21/2022, 12:46 PM   Clinical Narrative:   Patient discharging home. Needing [employer] card. Pharmacy contacted they will deliver and educate with a 30 day card.   Final next level of care: Home/Self Care Barriers to Discharge: Barriers Resolved   Patient Goals and CMS Choice CMS Medicare.gov Compare Post Acute Care list provided to:: Legal Guardian Choice offered to / list presented to : Patient  Discharge Placement              Patient and family notified of of transfer: 09/21/22  Discharge Plan and Services Additional resources added to the After Visit Summary for                      Social Determinants of Health (SDOH) Interventions SDOH Screenings   Tobacco Use: Low Risk  (07/27/2022)

## 2022-09-21 NOTE — Progress Notes (Signed)
PROGRESS NOTE    Kyle Schultz  [id number] DOB: 11/21/1929 DOA: 09/18/2022 PCP: Carrolyn Meiers, MD   Brief Narrative:     Kyle Schultz is a 89 y.o. male with medical history significant for combined systolic and diastolic CHF, hypertension, permanent atrial fibrillation with cardiac pacemaker on Eliquis, CKD stage IV, type 2 diabetes, and dementia who presented to the ED with complaints of some low back pain.  He denies any complaints of chest pain or shortness of breath and was admitted with acute on chronic combined CHF exacerbation with possible non-STEMI.  2D echocardiogram 3/10 with LVEF 40-45% and some regional wall motion abnormalities that appear to be consistent with prior 2D echocardiogram in 2022.  Cardiology evaluation obtained and he is now on GDMT and they have signed off.  PT now recommending SNF and patient and family agreeable to placement.  Assessment & Plan:   Principal Problem:   NSTEMI (non-ST elevated myocardial infarction) ([place]) Active Problems:   Acute on chronic combined systolic and diastolic CHF (congestive heart failure) /EF 35 to 40 %   CKD (chronic kidney disease), stage IV (HCC)   Dyslipidemia   Essential hypertension, benign   Type 2 diabetes mellitus with other circulatory complications (HCC)   Mixed hyperlipidemia  Assessment and Plan:  Acute on chronic combined diastolic and CHF exacerbation with possible NSTEMI -Cardiology previously recommended medical therapy over invasive options given age and CKD -Now on GDMT with oral Lasix, metoprolol succinate, Entresto, and BiDil -Repeat 2D echocardiogram with some wall motion abnormalities as prior and LVEF 40-45% and appears stable -Cardiology evaluation appreciated and they have signed off   Fever with possible community-acquired pneumonia -Continue azithromycin and Rocephin for now -Procalcitonin 0.46 -Complete course of 5 days of antibiotics, currently d3/5   Liver and kidney lesions on  CT scan -Of undetermined significance -Cannot have MRI due to pacemaker -Can follow-up outpatient to see if any repeat CT scanning is desired, but with advanced age, would not recommend any other form of follow-up   Hypertension -Continue on medications as above   Permanent atrial fibrillation/cardiac pacemaker -Resumed home Eliquis -Continue to monitor on telemetry   CKD stage IV -Continue to monitor closely while on diuresis   Type 2 diabetes with hyperglycemia -SSI and carb modified diet   Dementia -Prior history of sundowning noted -Zyprexa at bedtime -Haldol as needed  Low back pain -Pain medications as needed and up in chair -PT evaluation recommending SNF  DVT prophylaxis: Eliquis Code Status: Full Family Communication: None at bedside Disposition Plan: Awaiting SNF placement Status is: Inpatient Remains inpatient appropriate because: Need for IV medications  Consultants:  Cardiology  Procedures:  None  Antimicrobials:  Anti-infectives (From admission, onward)    Start     Dose/Rate Route Frequency Ordered Stop   09/20/22 0900  cefTRIAXone (ROCEPHIN) 1 g in sodium chloride 0.9 % 100 mL IVPB        1 g 200 mL/hr over 30 Minutes Intravenous Every 24 hours 09/19/22 1131     09/19/22 0900  cefTRIAXone (ROCEPHIN) 1 g in sodium chloride 0.9 % 100 mL IVPB        1 g 200 mL/hr over 30 Minutes Intravenous  Once 09/19/22 0851 09/19/22 0937   09/19/22 0900  azithromycin (ZITHROMAX) 500 mg in sodium chloride 0.9 % 250 mL IVPB        500 mg 250 mL/hr over 60 Minutes Intravenous Every 24 hours 09/19/22 0851  Subjective: Patient seen and evaluated today with no new acute complaints or concerns. No acute concerns or events noted overnight.  Denies any chest pain or shortness of breath and has some ongoing low back pain.  PT recommending SNF placement.  Objective: Vitals:   09/20/22 1200 09/20/22 2011 09/21/22 0300 09/21/22 1335  BP: (!) 135/54 (!) 116/56  137/63 126/81  Pulse: 64 66 62 66  Resp: '20 20 19 19  '$ Temp:  98.8 F (37.1 C) 98.6 F (37 C) 98.1 F (36.7 C)  TempSrc:  Oral Oral Oral  SpO2: 100% 100% 97% 100%  Weight:   83.3 kg   Height:        Intake/Output Summary (Last 24 hours) at 09/21/2022 1451 Last data filed at 09/21/2022 1336 Gross per 24 hour  Intake 720 ml  Output 1550 ml  Net -830 ml    Filed Weights   09/19/22 1712 09/20/22 0600 09/21/22 0300  Weight: 86.9 kg 87.8 kg 83.3 kg    Examination:  General exam: Appears calm and comfortable  Respiratory system: Clear to auscultation. Respiratory effort normal. Cardiovascular system: S1 & S2 heard, RRR.  Gastrointestinal system: Abdomen is soft Central nervous system: Alert and awake Extremities: No edema Skin: No significant lesions noted Psychiatry: Flat affect.    Data Reviewed: I have personally reviewed following labs and imaging studies  CBC: Recent Labs  Lab 09/19/22 0043 09/20/22 0320  WBC 8.0 6.9  NEUTROABS 5.7  --   HGB 9.7* 9.3*  HCT 31.3* 30.1*  MCV 81.7 81.4  PLT 185 [id number]    Basic Metabolic Panel: Recent Labs  Lab 09/19/22 0043 09/20/22 0320 09/21/22 0456  NA 138 138 138  K 3.9 3.8 3.7  CL 107 106 103  CO2 19* 20* 23  GLUCOSE 178* 162* 209*  BUN 40* 40* 50*  CREATININE 2.13* 1.96* 2.08*  CALCIUM 9.2 8.9 9.0  MG  --  1.8 2.0    GFR: Estimated Creatinine Clearance: 25.9 mL/min (A) (by C-G formula based on SCr of 2.08 mg/dL (H)). Liver Function Tests: Recent Labs  Lab 09/19/22 0043  AST 25  ALT 14  ALKPHOS 45  BILITOT 1.2  PROT 7.4  ALBUMIN 3.4*    Recent Labs  Lab 09/19/22 0043  LIPASE 27    No results for input(s): "AMMONIA" in the last 168 hours. Coagulation Profile: Recent Labs  Lab 09/19/22 0043  INR 1.8*    Cardiac Enzymes: No results for input(s): "CKTOTAL", "CKMB", "CKMBINDEX", "TROPONINI" in the last 168 hours. BNP (last 3 results) No results for input(s): "PROBNP" in the last 8760  hours. HbA1C: Recent Labs    09/19/22 0043  HGBA1C 7.8*   CBG: Recent Labs  Lab 09/20/22 1131 09/20/22 1605 09/20/22 2014 09/21/22 0725 09/21/22 1122  GLUCAP 160* 171* 244* 189* 211*    Lipid Profile: No results for input(s): "CHOL", "HDL", "LDLCALC", "TRIG", "CHOLHDL", "LDLDIRECT" in the last 72 hours. Thyroid Function Tests: No results for input(s): "TSH", "T4TOTAL", "FREET4", "T3FREE", "THYROIDAB" in the last 72 hours. Anemia Panel: No results for input(s): "VITAMINB12", "FOLATE", "FERRITIN", "TIBC", "IRON", "RETICCTPCT" in the last 72 hours. Sepsis Labs: Recent Labs  Lab 09/19/22 0043 09/19/22 0344 09/19/22 1131  PROCALCITON  --   --  0.46  LATICACIDVEN 1.6 1.6  --      Recent Results (from the past 240 hour(s))  Blood Culture (routine x 2)     Status: None (Preliminary result)   Collection Time: 09/19/22 12:43 AM   Specimen:  Right Antecubital; Blood  Result Value Ref Range Status   Specimen Description   Final    RIGHT ANTECUBITAL BOTTLES DRAWN AEROBIC AND ANAEROBIC   Special Requests Blood Culture adequate volume  Final   Culture   Final    NO GROWTH < 24 HOURS Performed at [facility], [address], [place], [place] [address]    Report Status PENDING  Incomplete  Resp panel by RT-PCR (RSV, Flu A&B, Covid) Anterior Nasal Swab     Status: None   Collection Time: 09/19/22  1:00 AM   Specimen: Anterior Nasal Swab  Result Value Ref Range Status   SARS Coronavirus 2 by RT PCR NEGATIVE NEGATIVE Final    Comment: (NOTE) SARS-CoV-2 target nucleic acids are NOT DETECTED.  The SARS-CoV-2 RNA is generally detectable in upper respiratory specimens during the acute phase of infection. The lowest concentration of SARS-CoV-2 viral copies this assay can detect is 138 copies/mL. A negative result does not preclude SARS-Cov-2 infection and should not be used as the sole basis for treatment or other patient management decisions. A negative result may occur with   improper specimen collection/handling, submission of specimen other than nasopharyngeal swab, presence of viral mutation(s) within the areas targeted by this assay, and inadequate number of viral copies(<138 copies/mL). A negative result must be combined with clinical observations, patient history, and epidemiological information. The expected result is Negative.  Fact Sheet for Patients:  EntrepreneurPulse.com.au  Fact Sheet for Healthcare Providers:  IncredibleEmployment.be  This test is no t yet approved or cleared by the [place] FDA and  has been authorized for detection and/or diagnosis of SARS-CoV-2 by FDA under an Emergency Use Authorization (EUA). This EUA will remain  in effect (meaning this test can be used) for the duration of the COVID-19 declaration under Section 564(b)(1) of the Act, 21 U.S.C.section 360bbb-3(b)(1), unless the authorization is terminated  or revoked sooner.       Influenza A by PCR NEGATIVE NEGATIVE Final   Influenza B by PCR NEGATIVE NEGATIVE Final    Comment: (NOTE) The Xpert Xpress SARS-CoV-2/FLU/RSV plus assay is intended as an aid in the diagnosis of influenza from Nasopharyngeal swab specimens and should not be used as a sole basis for treatment. Nasal washings and aspirates are unacceptable for Xpert Xpress SARS-CoV-2/FLU/RSV testing.  Fact Sheet for Patients: EntrepreneurPulse.com.au  Fact Sheet for Healthcare Providers: IncredibleEmployment.be  This test is not yet approved or cleared by the [place] FDA and has been authorized for detection and/or diagnosis of SARS-CoV-2 by FDA under an Emergency Use Authorization (EUA). This EUA will remain in effect (meaning this test can be used) for the duration of the COVID-19 declaration under Section 564(b)(1) of the Act, 21 U.S.C. section 360bbb-3(b)(1), unless the authorization is terminated or revoked.      Resp Syncytial Virus by PCR NEGATIVE NEGATIVE Final    Comment: (NOTE) Fact Sheet for Patients: EntrepreneurPulse.com.au  Fact Sheet for Healthcare Providers: IncredibleEmployment.be  This test is not yet approved or cleared by the [place] FDA and has been authorized for detection and/or diagnosis of SARS-CoV-2 by FDA under an Emergency Use Authorization (EUA). This EUA will remain in effect (meaning this test can be used) for the duration of the COVID-19 declaration under Section 564(b)(1) of the Act, 21 U.S.C. section 360bbb-3(b)(1), unless the authorization is terminated or revoked.  Performed at [facility], [address], [place], [place] [address]   Blood Culture (routine x 2)     Status: None (  Preliminary result)   Collection Time: 09/19/22  3:44 AM   Specimen: Right Antecubital; Blood  Result Value Ref Range Status   Specimen Description   Final    RIGHT ANTECUBITAL BOTTLES DRAWN AEROBIC AND ANAEROBIC   Special Requests Blood Culture adequate volume  Final   Culture   Final    NO GROWTH < 24 HOURS Performed at [facility], [address], [place], [place] [address]    Report Status PENDING  Incomplete  MRSA Next Gen by PCR, Nasal     Status: None   Collection Time: 09/19/22  3:36 PM   Specimen: Nasal Mucosa; Nasal Swab  Result Value Ref Range Status   MRSA by PCR Next Gen NOT DETECTED NOT DETECTED Final    Comment: (NOTE) The GeneXpert MRSA Assay (FDA approved for NASAL specimens only), is one component of a comprehensive MRSA colonization surveillance program. It is not intended to diagnose MRSA infection nor to guide or monitor treatment for MRSA infections. Test performance is not FDA approved in patients less than 9 years old. Performed at [facility], [address], [place], [place] [address]          Radiology Studies: ECHOCARDIOGRAM COMPLETE  Result Date: 09/19/2022    ECHOCARDIOGRAM REPORT   Patient  Name:   Kyle Schultz Date of Exam: 09/19/2022 Medical Rec #:  [id number]     Height:       72.0 in Accession #:    [id number]    Weight:       178.6 lb Date of Birth:  1929-08-04      BSA:          2.030 m Patient Age:    62 years      BP:           155/76 mmHg Patient Gender: M             HR:           68 bpm. Exam Location:  [facility] Procedure: 2D Echo, Color Doppler and Cardiac Doppler Indications:    elevated troponin  History:        Patient has prior history of Echocardiogram examinations, most                 recent 01/03/2021. CHF, Pacemaker, chronic kidney disease; Risk                 Factors:Hypertension, Diabetes and Dyslipidemia.  Sonographer:    Johny Chess RDCS Referring Phys: [id number] Royanne Foots [facility]  Sonographer Comments: Shaking. IMPRESSIONS  1. Difficult assessment of LV function, consider echocontrast for better evaluation. Grossly appears mildly decreaesd 40-45%. The majority of the hypokinesis appears to be toward the apex where visualizaiton is limited. . Left ventricular ejection fraction, by estimation, is 40 to 45%. The left ventricle has mildly decreased function. Left ventricular endocardial border not optimally defined to evaluate regional wall motion. There is mild left ventricular hypertrophy. Left ventricular diastolic parameters are indeterminate.  2. Right ventricular systolic function is normal. The right ventricular size is normal. There is moderately elevated pulmonary artery systolic pressure.  3. Left atrial size was mildly dilated.  4. The mitral valve is abnormal. Mild mitral valve regurgitation. No evidence of mitral stenosis.  5. The tricuspid valve is abnormal.  6. The aortic valve is tricuspid. There is mild calcification of the aortic valve. There is mild thickening of the aortic valve. Aortic valve regurgitation is not visualized. No aortic stenosis is  present.  7. The inferior vena cava is normal in size with greater than 50% respiratory variability, suggesting  right atrial pressure of 3 mmHg. FINDINGS  Left Ventricle: Difficult assessment of LV function, consider echocontrast for better evaluation. Grossly appears mildly decreaesd 40-45%. The majority of the hypokinesis appears to be toward the apex where visualizaiton is limited. Left ventricular ejection fraction, by estimation, is 40 to 45%. The left ventricle has mildly decreased function. Left ventricular endocardial border not optimally defined to evaluate regional wall motion. The left ventricular internal cavity size was normal in size. There is mild left ventricular hypertrophy. Left ventricular diastolic parameters are indeterminate. Right Ventricle: The right ventricular size is normal. Right vetricular wall thickness was not well visualized. Right ventricular systolic function is normal. There is moderately elevated pulmonary artery systolic pressure. The tricuspid regurgitant velocity is 3.75 m/s, and with an assumed right atrial pressure of 3 mmHg, the estimated right ventricular systolic pressure is [id number]. Left Atrium: Left atrial size was mildly dilated. Right Atrium: Right atrial size was normal in size. Pericardium: There is no evidence of pericardial effusion. Mitral Valve: The mitral valve is abnormal. Mild mitral valve regurgitation. No evidence of mitral valve stenosis. Tricuspid Valve: The tricuspid valve is abnormal. Tricuspid valve regurgitation is mild . No evidence of tricuspid stenosis. Aortic Valve: The aortic valve is tricuspid. There is mild calcification of the aortic valve. There is mild thickening of the aortic valve. There is mild aortic valve annular calcification. Aortic valve regurgitation is not visualized. Aortic regurgitation PHT measures 522 msec. No aortic stenosis is present. Aortic valve mean gradient measures 5.0 mmHg. Aortic valve peak gradient measures 10.2 mmHg. Aortic valve area, by VTI measures 1.56 cm. Pulmonic Valve: The pulmonic valve was not well visualized.  Pulmonic valve regurgitation is mild. No evidence of pulmonic stenosis. Aorta: The aortic root and ascending aorta are structurally normal, with no evidence of dilitation. Venous: The inferior vena cava is normal in size with greater than 50% respiratory variability, suggesting right atrial pressure of 3 mmHg. IAS/Shunts: No atrial level shunt detected by color flow Doppler.  LEFT VENTRICLE PLAX 2D LVIDd:         5.00 cm      Diastology LVIDs:         3.60 cm      LV e' medial:    7.51 cm/s LV PW:         1.10 cm      LV E/e' medial:  13.2 LV IVS:        1.20 cm      LV e' lateral:   12.00 cm/s LVOT diam:     2.20 cm      LV E/e' lateral: 8.3 LV SV:         48 LV SV Index:   24 LVOT Area:     3.80 cm  LV Volumes (MOD) LV vol d, MOD A2C: 113.0 ml LV vol s, MOD A2C: 70.1 ml LV SV MOD A2C:     42.9 ml RIGHT VENTRICLE             IVC RV Basal diam:  3.10 cm     IVC diam: 2.10 cm RV S prime:     10.30 cm/s TAPSE (M-mode): 1.3 cm LEFT ATRIUM             Index        RIGHT ATRIUM           Index  LA diam:        4.70 cm 2.31 cm/m   RA Area:     18.40 cm LA Vol (A2C):   98.4 ml 48.46 ml/m  RA Volume:   47.90 ml  23.59 ml/m LA Vol (A4C):   70.8 ml 34.87 ml/m LA Biplane Vol: 83.4 ml 41.08 ml/m  AORTIC VALVE AV Area (Vmax):    1.76 cm AV Area (Vmean):   1.72 cm AV Area (VTI):     1.56 cm AV Vmax:           160.03 cm/s AV Vmean:          102.499 cm/s AV VTI:            0.310 m AV Peak Grad:      10.2 mmHg AV Mean Grad:      5.0 mmHg LVOT Vmax:         74.10 cm/s LVOT Vmean:        46.500 cm/s LVOT VTI:          0.127 m LVOT/AV VTI ratio: 0.41 AI PHT:            522 msec  AORTA Ao Root diam: 3.40 cm Ao Asc diam:  3.40 cm MITRAL VALVE               TRICUSPID VALVE MV Area (PHT): 4.49 cm    TR Peak grad:   56.2 mmHg MV Decel Time: 169 msec    TR Vmax:        375.00 cm/s MV E velocity: 99.20 cm/s MV A velocity: 57.50 cm/s  SHUNTS MV E/A ratio:  1.73        Systemic VTI:  0.13 m                            Systemic Diam:  2.20 cm Carlyle Dolly MD Electronically signed by Carlyle Dolly MD Signature Date/Time: 09/19/2022/3:18:18 PM    Final         Scheduled Meds:  apixaban  2.5 mg Oral BID   Chlorhexidine Gluconate Cloth  6 each Topical Daily   furosemide  40 mg Oral BID   insulin aspart  0-15 Units Subcutaneous TID WC   insulin aspart  0-5 Units Subcutaneous QHS   isosorbide-hydrALAZINE  1 tablet Oral BID   metoprolol succinate  25 mg Oral Daily   simvastatin  20 mg Oral QHS   sodium chloride flush  3 mL Intravenous Q12H   Continuous Infusions:  sodium chloride     azithromycin 500 mg (09/21/22 1130)   cefTRIAXone (ROCEPHIN)  IV 1 g (09/21/22 1040)     LOS: 2 days    Time spent: 35 minutes    Charie Pinkus Darleen Crocker, DO [facility]  If 7PM-7AM, please contact night-coverage [email or web address] 09/21/2022, 2:51 PM

## 2022-09-21 NOTE — TOC Initial Note (Signed)
Transition of Care [facility]) - Initial/Assessment Note    Patient Details  Name: Kyle Schultz MRN: [id number] Date of Birth: 09-Dec-1929  [facility]) CM/SW Contact:    Boneta Lucks, RN Phone Number: 09/21/2022, 3:01 PM  Clinical Narrative:         Patient admitted with NSTEMI. PT is recommending SNF. CM spoke with his sister,  Deneise Lever. Patient lives with her, he uses a walker and wheelchair. She is agreeing he needs SNF wanting PNC. She states he has been vaccinated for COVID. FL2 completed and sent out for bed offers. Starting INS AUTH. Patient is medically ready.        Expected Discharge Plan: Skilled Nursing Facility Barriers to Discharge: Continued Medical Work up, No SNF bed, Insurance Authorization   Patient Goals and CMS Choice Patient states their goals for this hospitalization and ongoing recovery are:: agreeable to SNF CMS Medicare.gov Compare Post Acute Care list provided to:: Patient Represenative (must comment) Choice offered to / list presented to : Sibling     Expected Discharge Plan and Services      Living arrangements for the past 2 months: Single Family Home                      Prior Living Arrangements/Services Living arrangements for the past 2 months: Single Family Home Lives with:: Siblings Patient language and need for interpreter reviewed:: Yes        Need for Family Participation in Patient Care: Yes (Comment) Care giver support system in place?: Yes (comment)   Criminal Activity/Legal Involvement Pertinent to Current Situation/Hospitalization: No - Comment as needed  Activities of Daily Living Home Assistive Devices/Equipment: Cane (specify quad or straight) ADL Screening (condition at time of admission) Patient's cognitive ability adequate to safely complete daily activities?: Yes Is the patient deaf or have difficulty hearing?: Yes Does the patient have difficulty seeing, even when wearing glasses/contacts?: No Does the patient  have difficulty concentrating, remembering, or making decisions?: No Patient able to express need for assistance with ADLs?: Yes Does the patient have difficulty dressing or bathing?: Yes Independently performs ADLs?: Yes (appropriate for developmental age) Does the patient have difficulty walking or climbing stairs?: Yes Weakness of Legs: Both Weakness of Arms/Hands: None  Permission Sought/Granted     Emotional Assessment      Orientation: : Oriented to Self Alcohol / Substance Use: Not Applicable Psych Involvement: No (comment)  Admission diagnosis:  Urinary retention [R33.9] Nausea [R11.0] Malaise [R53.81] Elevated troponin [R79.89] NSTEMI (non-ST elevated myocardial infarction) ([place]) [I21.4] Fever, unspecified fever cause [R50.9] Constipation, unspecified constipation type [K59.00] Chronic low back pain, unspecified back pain laterality, unspecified whether sciatica present [M54.50, G89.29] Patient Active Problem List   Diagnosis Date Noted   NSTEMI (non-ST elevated myocardial infarction) ([place]) 09/19/2022   Acute on chronic combined systolic and diastolic CHF (congestive heart failure) /EF 35 to 40 % 01/07/2021   CKD (chronic kidney disease), stage IV ([place]) 01/07/2021   Acute on chronic congestive heart failure ([place])    Acute on chronic diastolic CHF (congestive heart failure) ([place]) 01/02/2021   History of colonic polyps 03/20/2019   Mixed hyperlipidemia 11/03/2017   Uncontrolled type 2 diabetes mellitus with stage 3 chronic kidney disease 08/11/2017   Type 2 diabetes mellitus with other circulatory complications ([place]) [id number]   Leukopenia 03/31/2017   Chest pain 03/31/2017   SIRS (systemic inflammatory response syndrome) ([place]) 05/31/2016   Fever 05/31/2016   CKD (chronic kidney disease) stage  3, GFR 30-59 ml/min (HCC) 06/26/2015   Palpitations 06/26/2015   SINOATRIAL NODE DYSFUNCTION 08/01/2009   Cardiac pacemaker in situ 08/01/2009   Dyslipidemia 03/10/2009    Essential hypertension, benign 03/10/2009   PCP:  Carrolyn Meiers, MD Pharmacy:   [place], [place]. [address] [place] [address] Phone: [phone] Fax: [phone]     Social Determinants of Health (SDOH) Social History: SDOH Screenings   Tobacco Use: Low Risk  (07/27/2022)   SDOH Interventions:    Readmission Risk Interventions    09/21/2022    2:59 PM  Readmission Risk Prevention Plan  Transportation Screening Complete  PCP or Specialist Appt within 5-7 Days Not Complete  Home Care Screening Complete  Medication Review (RN CM) Complete

## 2022-09-21 NOTE — Progress Notes (Signed)
Progress Note  Patient Name: Kyle Schultz Date of Encounter: 09/21/2022  Primary Cardiologist: None  Subjective   No acute events overnight. ReDS vest today is 27. Patient feels significantly better compared to yesterday.  Inpatient Medications    Scheduled Meds:  apixaban  2.5 mg Oral BID   Chlorhexidine Gluconate Cloth  6 each Topical Daily   insulin aspart  0-15 Units Subcutaneous TID WC   insulin aspart  0-5 Units Subcutaneous QHS   isosorbide-hydrALAZINE  1 tablet Oral BID   metoprolol succinate  25 mg Oral Daily   simvastatin  20 mg Oral QHS   sodium chloride flush  3 mL Intravenous Q12H   Continuous Infusions:  sodium chloride     azithromycin 500 mg (09/21/22 1130)   cefTRIAXone (ROCEPHIN)  IV 1 g (09/21/22 1040)   PRN Meds: sodium chloride, acetaminophen **OR** acetaminophen, docusate sodium, haloperidol lactate, OLANZapine, ondansetron **OR** ondansetron (ZOFRAN) IV, sodium chloride flush   Vital Signs    Vitals:   09/20/22 1153 09/20/22 1200 09/20/22 2011 09/21/22 0300  BP:  (!) 135/54 (!) 116/56 137/63  Pulse:  64 66 62  Resp:  '20 20 19  '$ Temp: 98.2 F (36.8 C)  98.8 F (37.1 C) 98.6 F (37 C)  TempSrc: Oral  Oral Oral  SpO2:  100% 100% 97%  Weight:    83.3 kg  Height:        Intake/Output Summary (Last 24 hours) at 09/21/2022 1249 Last data filed at 09/21/2022 0834 Gross per 24 hour  Intake 830 ml  Output 1900 ml  Net -1070 ml   Filed Weights   09/19/22 1712 09/20/22 0600 09/21/22 0300  Weight: 86.9 kg 87.8 kg 83.3 kg    Telemetry     Personally reviewed  ECG    Not performed today  Physical Exam   GEN: No acute distress.   Neck: No JVD. Cardiac: RRR, no murmur, rub, or gallop.  Respiratory: Nonlabored. Clear to auscultation bilaterally. GI: Soft, nontender, bowel sounds present. MS: No edema; No deformity. Neuro:  Nonfocal. Psych: Alert and oriented x 3. Normal affect.  Labs    Chemistry Recent Labs  Lab 09/19/22 0043  09/20/22 0320 09/21/22 0456  NA 138 138 138  K 3.9 3.8 3.7  CL 107 106 103  CO2 19* 20* 23  GLUCOSE 178* 162* 209*  BUN 40* 40* 50*  CREATININE 2.13* 1.96* 2.08*  CALCIUM 9.2 8.9 9.0  PROT 7.4  --   --   ALBUMIN 3.4*  --   --   AST 25  --   --   ALT 14  --   --   ALKPHOS 45  --   --   BILITOT 1.2  --   --   GFRNONAA 29* 32* 30*  ANIONGAP '12 12 12     '$ Hematology Recent Labs  Lab 09/19/22 0043 09/20/22 0320  WBC 8.0 6.9  RBC 3.83* 3.70*  HGB 9.7* 9.3*  HCT 31.3* 30.1*  MCV 81.7 81.4  MCH 25.3* 25.1*  MCHC 31.0 30.9  RDW 16.1* 15.9*  PLT 185 167    Cardiac Enzymes Recent Labs  Lab 09/19/22 0043 09/19/22 0344 09/19/22 0927 09/19/22 1131 09/20/22 1311  TROPONINIHS 140* 203* 328* 289* 183*    BNPNo results for input(s): "BNP", "PROBNP" in the last 168 hours.   DDimerNo results for input(s): "DDIMER" in the last 168 hours.   Radiology    ECHOCARDIOGRAM COMPLETE  Result Date: 09/19/2022  ECHOCARDIOGRAM REPORT   Patient Name:   Kyle Schultz Date of Exam: 09/19/2022 Medical Rec #:  [id number]     Height:       72.0 in Accession #:    [id number]    Weight:       178.6 lb Date of Birth:  03/28/1930      BSA:          2.030 m Patient Age:    48 years      BP:           155/76 mmHg Patient Gender: M             HR:           68 bpm. Exam Location:  [facility] Procedure: 2D Echo, Color Doppler and Cardiac Doppler Indications:    elevated troponin  History:        Patient has prior history of Echocardiogram examinations, most                 recent 01/03/2021. CHF, Pacemaker, chronic kidney disease; Risk                 Factors:Hypertension, Diabetes and Dyslipidemia.  Sonographer:    Johny Chess RDCS Referring Phys: [id number] Royanne Foots [facility]  Sonographer Comments: Shaking. IMPRESSIONS  1. Difficult assessment of LV function, consider echocontrast for better evaluation. Grossly appears mildly decreaesd 40-45%. The majority of the hypokinesis appears to be toward the apex  where visualizaiton is limited. . Left ventricular ejection fraction, by estimation, is 40 to 45%. The left ventricle has mildly decreased function. Left ventricular endocardial border not optimally defined to evaluate regional wall motion. There is mild left ventricular hypertrophy. Left ventricular diastolic parameters are indeterminate.  2. Right ventricular systolic function is normal. The right ventricular size is normal. There is moderately elevated pulmonary artery systolic pressure.  3. Left atrial size was mildly dilated.  4. The mitral valve is abnormal. Mild mitral valve regurgitation. No evidence of mitral stenosis.  5. The tricuspid valve is abnormal.  6. The aortic valve is tricuspid. There is mild calcification of the aortic valve. There is mild thickening of the aortic valve. Aortic valve regurgitation is not visualized. No aortic stenosis is present.  7. The inferior vena cava is normal in size with greater than 50% respiratory variability, suggesting right atrial pressure of 3 mmHg. FINDINGS  Left Ventricle: Difficult assessment of LV function, consider echocontrast for better evaluation. Grossly appears mildly decreaesd 40-45%. The majority of the hypokinesis appears to be toward the apex where visualizaiton is limited. Left ventricular ejection fraction, by estimation, is 40 to 45%. The left ventricle has mildly decreased function. Left ventricular endocardial border not optimally defined to evaluate regional wall motion. The left ventricular internal cavity size was normal in size. There is mild left ventricular hypertrophy. Left ventricular diastolic parameters are indeterminate. Right Ventricle: The right ventricular size is normal. Right vetricular wall thickness was not well visualized. Right ventricular systolic function is normal. There is moderately elevated pulmonary artery systolic pressure. The tricuspid regurgitant velocity is 3.75 m/s, and with an assumed right atrial pressure of 3  mmHg, the estimated right ventricular systolic pressure is [id number]. Left Atrium: Left atrial size was mildly dilated. Right Atrium: Right atrial size was normal in size. Pericardium: There is no evidence of pericardial effusion. Mitral Valve: The mitral valve is abnormal. Mild mitral valve regurgitation. No evidence of mitral valve stenosis. Tricuspid Valve: The tricuspid valve is  abnormal. Tricuspid valve regurgitation is mild . No evidence of tricuspid stenosis. Aortic Valve: The aortic valve is tricuspid. There is mild calcification of the aortic valve. There is mild thickening of the aortic valve. There is mild aortic valve annular calcification. Aortic valve regurgitation is not visualized. Aortic regurgitation PHT measures 522 msec. No aortic stenosis is present. Aortic valve mean gradient measures 5.0 mmHg. Aortic valve peak gradient measures 10.2 mmHg. Aortic valve area, by VTI measures 1.56 cm. Pulmonic Valve: The pulmonic valve was not well visualized. Pulmonic valve regurgitation is mild. No evidence of pulmonic stenosis. Aorta: The aortic root and ascending aorta are structurally normal, with no evidence of dilitation. Venous: The inferior vena cava is normal in size with greater than 50% respiratory variability, suggesting right atrial pressure of 3 mmHg. IAS/Shunts: No atrial level shunt detected by color flow Doppler.  LEFT VENTRICLE PLAX 2D LVIDd:         5.00 cm      Diastology LVIDs:         3.60 cm      LV e' medial:    7.51 cm/s LV PW:         1.10 cm      LV E/e' medial:  13.2 LV IVS:        1.20 cm      LV e' lateral:   12.00 cm/s LVOT diam:     2.20 cm      LV E/e' lateral: 8.3 LV SV:         48 LV SV Index:   24 LVOT Area:     3.80 cm  LV Volumes (MOD) LV vol d, MOD A2C: 113.0 ml LV vol s, MOD A2C: 70.1 ml LV SV MOD A2C:     42.9 ml RIGHT VENTRICLE             IVC RV Basal diam:  3.10 cm     IVC diam: 2.10 cm RV S prime:     10.30 cm/s TAPSE (M-mode): 1.3 cm LEFT ATRIUM             Index         RIGHT ATRIUM           Index LA diam:        4.70 cm 2.31 cm/m   RA Area:     18.40 cm LA Vol (A2C):   98.4 ml 48.46 ml/m  RA Volume:   47.90 ml  23.59 ml/m LA Vol (A4C):   70.8 ml 34.87 ml/m LA Biplane Vol: 83.4 ml 41.08 ml/m  AORTIC VALVE AV Area (Vmax):    1.76 cm AV Area (Vmean):   1.72 cm AV Area (VTI):     1.56 cm AV Vmax:           160.03 cm/s AV Vmean:          102.499 cm/s AV VTI:            0.310 m AV Peak Grad:      10.2 mmHg AV Mean Grad:      5.0 mmHg LVOT Vmax:         74.10 cm/s LVOT Vmean:        46.500 cm/s LVOT VTI:          0.127 m LVOT/AV VTI ratio: 0.41 AI PHT:            522 msec  AORTA Ao Root diam: 3.40 cm Ao Asc diam:  3.40  cm MITRAL VALVE               TRICUSPID VALVE MV Area (PHT): 4.49 cm    TR Peak grad:   56.2 mmHg MV Decel Time: 169 msec    TR Vmax:        375.00 cm/s MV E velocity: 99.20 cm/s MV A velocity: 57.50 cm/s  SHUNTS MV E/A ratio:  1.73        Systemic VTI:  0.13 m                            Systemic Diam: 2.20 cm Carlyle Dolly MD Electronically signed by Carlyle Dolly MD Signature Date/Time: 09/19/2022/3:18:18 PM    Final      Assessment & Plan   Patient is a 89 year old M known to have persistent A-fib/atrial flutter and CHB s/p Saint Jude PPM, cardiomyopathy LVEF 40 to 45% in 09/2022, CKD stage IV, DM 2, dementia is currently admitted to hospital for management of acute systolic heart failure exacerbation and pneumonia.   # Acute systolic heart failure exacerbation LVEF 40 to 45%, currently near compensated -Switch IV Lasix to p.o. Lasix 40 mg twice daily (Reds vest is 27 today) -Continue metoprolol succinate 25 mg once daily -Continue Entresto 24-26 mg twice daily (low-dose due to CKD and SW will provide him with coupons.  He will need to fill patient assistance program as outpatient) -Continue BiDil 20-37.5 mg twice daily -Ischemia evaluation was deferred due to advanced age and CKD.  Outpatient cardiology follow-up.  # Acute myocardial  injury with no evidence of myocardial infarction -Likely secondary to demand ischemia from heart failure exacerbation and pneumonia.  EKG showed V paced rhythm.  No indication of heparin drip.  # Community-acquired pneumonia -Continue antibiotics per primary team  # A-fib/atrial flutter and CHB s/p Saint Jude PPM -Continue metoprolol succinate 25 mg once daily -Switch heparin drip to Eliquis 2.5 mg twice daily  [facility] will sign off.   Medication Recommendations: P.o. Lasix 40 mg twice daily, metoprolol succinate 25 mg once daily, Entresto 24-26 mg twice daily, BiDil 20-37.5 mg twice daily, Eliquis 2.5 mg twice daily Other recommendations (labs, testing, etc): None Follow up as an outpatient: Outpatient cardiology follow-up in 2 weeks upon discharge   I have spent a total of 33 minutes with patient reviewing chart , telemetry, EKGs, labs and examining patient as well as establishing an assessment and plan that was discussed with the patient.  > 50% of time was spent in direct patient care.      Signed, Chalmers Guest, MD  09/21/2022, 12:49 PM

## 2022-09-21 NOTE — Evaluation (Signed)
Physical Therapy Evaluation Patient Details Name: Kyle Schultz MRN: [id number] DOB: [birth year] Today's Date: 09/21/2022  History of Present Illness  Kyle Schultz is a 89 y.o. male with medical history significant for combined systolic and diastolic CHF, hypertension, permanent atrial fibrillation with cardiac pacemaker on Eliquis, CKD stage IV, type 2 diabetes, and dementia who presented to the ED with complaints of some low back pain.  He states that his hips also bother him and it is difficult to obtain further history from him as he does not say very much.  He denies any chest pain or shortness of breath however.  He was also noted to be febrile on admission.   Clinical Impression  Patient demonstrates slow labored movement for sitting up at bedside with c/o severe pain in legs, mostly knees.  Patient required repeated attempts and frequent verbal/tactile cueing before able to sit, unable to attempt sit to stands or transferring to chair secondary to BLE pain.  Patient demonstrates fair/good return for using BUE to help pull self to [facility] with bed in head down position.  Patient will benefit from continued skilled physical therapy in hospital and recommended venue below to increase strength, balance, endurance for safe ADLs and gait.         Recommendations for follow up therapy are one component of a multi-disciplinary discharge planning process, led by the attending physician.  Recommendations may be updated based on patient status, additional functional criteria and insurance authorization.  Follow Up Recommendations Skilled nursing-short term rehab (<3 hours/day) Can patient physically be transported by private vehicle: No    Assistance Recommended at Discharge Set up Supervision/Assistance  Patient can return home with the following  A lot of help with bathing/dressing/bathroom;A lot of help with walking and/or transfers;Help with stairs or ramp for entrance;Assistance with  cooking/housework    Equipment Recommendations None recommended by PT  Recommendations for Other Services       Functional Status Assessment Patient has had a recent decline in their functional status and demonstrates the ability to make significant improvements in function in a reasonable and predictable amount of time.     Precautions / Restrictions Precautions Precautions: Fall Restrictions Weight Bearing Restrictions: No      Mobility  Bed Mobility Overal bed mobility: Needs Assistance Bed Mobility: Supine to Sit, Sit to Supine     Supine to sit: Max assist Sit to supine: Max assist   General bed mobility comments: slow labored movement with c/o severe pain in legs    Transfers                        Ambulation/Gait                  Stairs            Wheelchair Mobility    Modified Rankin (Stroke Patients Only)       Balance Overall balance assessment: Needs assistance Sitting-balance support: Feet supported, No upper extremity supported Sitting balance-Leahy Scale: Poor Sitting balance - Comments: seated at EOB Postural control: Posterior lean                                   Pertinent Vitals/Pain Pain Assessment Pain Assessment: Faces Pain Score: 9  Faces Pain Scale: Hurts whole lot Pain Location: BLE mostly knees with any movement Pain Descriptors / Indicators: Sore, Grimacing, Discomfort, Sharp Pain  Intervention(s): Limited activity within patient's tolerance, Monitored during session, Repositioned    Home Living Family/patient expects to be discharged to:: Private residence Living Arrangements: Other relatives Available Help at Discharge: Family;Available PRN/intermittently Type of Home: House Home Access: Ramped entrance       Home Layout: One level Home Equipment: Conservation officer, nature (2 wheels);Wheelchair - manual      Prior Function Prior Level of Function : Needs assist       Physical Assist :  Mobility (physical);ADLs (physical) Mobility (physical): Bed mobility;Transfers;Gait;Stairs   Mobility Comments: assisted transfers to wheelchair, sometimes able to transfer by himself, uses wheelchair for mobility, non-ambulator ADLs Comments: uses depends, does not transfer to [facility] or commode, "per patient"     Hand Dominance        Extremity/Trunk Assessment   Upper Extremity Assessment Upper Extremity Assessment: Generalized weakness    Lower Extremity Assessment Lower Extremity Assessment: Generalized weakness    Cervical / Trunk Assessment Cervical / Trunk Assessment: Normal  Communication   Communication: HOH  Cognition Arousal/Alertness: Awake/alert Behavior During Therapy: WFL for tasks assessed/performed Overall Cognitive Status: History of cognitive impairments - at baseline                                          General Comments      Exercises     Assessment/Plan    PT Assessment Patient needs continued PT services  PT Problem List Decreased strength;Decreased activity tolerance;Decreased balance;Decreased mobility       PT Treatment Interventions DME instruction;Functional mobility training;Therapeutic activities;Therapeutic exercise;Wheelchair mobility training;Patient/family education;Balance training    PT Goals (Current goals can be found in the Care Plan section)  Acute Rehab PT Goals Patient Stated Goal: return home after rehab PT Goal Formulation: With patient Time For Goal Achievement: 10/05/22 Potential to Achieve Goals: Good    Frequency Min 3X/week     Co-evaluation               AM-PAC PT "6 Clicks" Mobility  Outcome Measure Help needed turning from your back to your side while in a flat bed without using bedrails?: A Lot Help needed moving from lying on your back to sitting on the side of a flat bed without using bedrails?: A Lot Help needed moving to and from a bed to a chair (including a wheelchair)?:  Total Help needed standing up from a chair using your arms (e.g., wheelchair or bedside chair)?: Total Help needed to walk in hospital room?: Total Help needed climbing 3-5 steps with a railing? : Total 6 Click Score: 8    End of Session   Activity Tolerance: Patient tolerated treatment well;Patient limited by fatigue;Patient limited by pain Patient left: in bed;with call bell/phone within reach Nurse Communication: Mobility status PT Visit Diagnosis: Unsteadiness on feet (R26.81);Other abnormalities of gait and mobility (R26.89);Muscle weakness (generalized) (M62.81)    Time: [id number] PT Time Calculation (min) (ACUTE ONLY): 20 min   Charges:   PT Evaluation $PT Eval Moderate Complexity: 1 Mod PT Treatments $Therapeutic Activity: 8-22 mins        4:28 PM, 09/21/22 Lonell Grandchild, MPT Physical Therapist with [facility] [phone] office [phone] mobile phone

## 2022-09-21 NOTE — NC FL2 (Signed)
[place] LEVEL OF CARE FORM     IDENTIFICATION  Patient Name: Kyle Schultz Birthdate: Nov 24, 1929 Sex: male Admission Date (Current Location): 09/18/2022  [facility] and [place] Number:  [employer] and Address:  [place] [address] [place]      Provider Number: [id number]  Attending Physician Name and Address:  Rodena Goldmann, DO  Relative Name and Phone Number:  Noah Delaine) [phone]    Current Level of Care: Hospital Recommended Level of Care: [place] Prior Approval Number:    Date Approved/Denied:   PASRR Number: [id number]  Discharge Plan: SNF    Current Diagnoses: Patient Active Problem List   Diagnosis Date Noted   NSTEMI (non-ST elevated myocardial infarction) ([place]) 09/19/2022   Acute on chronic combined systolic and diastolic CHF (congestive heart failure) /EF 35 to 40 % 01/07/2021   CKD (chronic kidney disease), stage IV ([place]) 01/07/2021   Acute on chronic congestive heart failure ([place])    Acute on chronic diastolic CHF (congestive heart failure) ([place]) 01/02/2021   History of colonic polyps 03/20/2019   Mixed hyperlipidemia 11/03/2017   Uncontrolled type 2 diabetes mellitus with stage 3 chronic kidney disease 08/11/2017   Type 2 diabetes mellitus with other circulatory complications ([place]) [id number]   Leukopenia 03/31/2017   Chest pain 03/31/2017   SIRS (systemic inflammatory response syndrome) ([place]) 05/31/2016   Fever 05/31/2016   CKD (chronic kidney disease) stage 3, GFR 30-59 ml/min ([place]) 06/26/2015   Palpitations 06/26/2015   SINOATRIAL NODE DYSFUNCTION 08/01/2009   Cardiac pacemaker in situ 08/01/2009   Dyslipidemia 03/10/2009   Essential hypertension, benign 03/10/2009    Orientation RESPIRATION BLADDER Height & Weight     Self  Normal Incontinent Weight: 83.3 kg Height:  6' (182.9 cm)  BEHAVIORAL SYMPTOMS/MOOD NEUROLOGICAL BOWEL NUTRITION STATUS      Continent  Diet (See DC summary)  AMBULATORY STATUS COMMUNICATION OF NEEDS Skin   Extensive Assist Verbally Normal                       Personal Care Assistance Level of Assistance  Bathing, Feeding, Dressing Bathing Assistance: Maximum assistance Feeding assistance: Limited assistance Dressing Assistance: Maximum assistance     Functional Limitations Info  Sight, Hearing, Speech Sight Info: Impaired Hearing Info: Impaired Speech Info: Adequate    SPECIAL CARE FACTORS FREQUENCY  PT (By licensed PT)     PT Frequency: 5 times a week              Contractures Contractures Info: Not present    Additional Factors Info  Code Status, Allergies Code Status Info: FULL Allergies Info: " Fluid pill"           Current Medications (09/21/2022):  This is the current hospital active medication list Current Facility-Administered Medications  Medication Dose Route Frequency Provider Last Rate Last Admin   0.9 %  sodium chloride infusion  250 mL Intravenous PRN Manuella Ghazi, Pratik D, DO       acetaminophen (TYLENOL) tablet 650 mg  650 mg Oral Q6H PRN Heath Lark D, DO   650 mg at 09/21/22 [id number]   Or   acetaminophen (TYLENOL) suppository 650 mg  650 mg Rectal Q6H PRN Manuella Ghazi, Pratik D, DO       apixaban (ELIQUIS) tablet 2.5 mg  2.5 mg Oral BID Mallipeddi, Vishnu P, MD   2.5 mg at 09/21/22 1035   azithromycin (ZITHROMAX) 500 mg in sodium  chloride 0.9 % 250 mL IVPB  500 mg Intravenous Q24H Heath Lark D, DO 250 mL/hr at 09/21/22 1130 500 mg at 09/21/22 1130   cefTRIAXone (ROCEPHIN) 1 g in sodium chloride 0.9 % 100 mL IVPB  1 g Intravenous Q24H Manuella Ghazi, Pratik D, DO 200 mL/hr at 09/21/22 1040 1 g at 09/21/22 1040   Chlorhexidine Gluconate Cloth 2 % PADS 6 each  6 each Topical Daily Manuella Ghazi, Pratik D, DO   6 each at 09/21/22 1036   docusate sodium (COLACE) capsule 100 mg  100 mg Oral Daily PRN Adefeso, Oladapo, DO   100 mg at 09/20/22 0013   furosemide (LASIX) tablet 40 mg  40 mg Oral BID Mallipeddi, Vishnu  P, MD       haloperidol lactate (HALDOL) injection 2 mg  2 mg Intravenous Q6H PRN Manuella Ghazi, Pratik D, DO       insulin aspart (novoLOG) injection 0-15 Units  0-15 Units Subcutaneous TID WC Manuella Ghazi, Pratik D, DO   5 Units at 09/21/22 1157   insulin aspart (novoLOG) injection 0-5 Units  0-5 Units Subcutaneous QHS Manuella Ghazi, Pratik D, DO   2 Units at 09/20/22 2112   isosorbide-hydrALAZINE (BIDIL) 20-37.5 MG per tablet 1 tablet  1 tablet Oral BID Manuella Ghazi, Pratik D, DO   1 tablet at 09/21/22 1044   metoprolol succinate (TOPROL-XL) 24 hr tablet 25 mg  25 mg Oral Daily Manuella Ghazi, Pratik D, DO   25 mg at 09/21/22 1034   OLANZapine (ZYPREXA) tablet 2.5 mg  2.5 mg Oral QHS PRN Manuella Ghazi, Pratik D, DO       ondansetron (ZOFRAN) tablet 4 mg  4 mg Oral Q6H PRN Manuella Ghazi, Pratik D, DO       Or   ondansetron (ZOFRAN) injection 4 mg  4 mg Intravenous Q6H PRN Manuella Ghazi, Pratik D, DO       simvastatin (ZOCOR) tablet 20 mg  20 mg Oral QHS Shah, Pratik D, DO   20 mg at 09/20/22 2113   sodium chloride flush (NS) 0.9 % injection 3 mL  3 mL Intravenous Q12H Shah, Pratik D, DO   3 mL at 09/21/22 1045   sodium chloride flush (NS) 0.9 % injection 3 mL  3 mL Intravenous PRN Heath Lark D, DO         Discharge Medications: Please see discharge summary for a list of discharge medications.  Relevant Imaging Results:  Relevant Lab Results:   Additional Information SS# [id number]  Boneta Lucks, RN

## 2022-09-21 NOTE — Progress Notes (Signed)
   09/21/22 0735  ReDS Vest / Clip  Station Marker D  Ruler Value 42  ReDS Value Range < 36  ReDS Actual Value 27

## 2022-09-21 NOTE — Plan of Care (Signed)
  Problem: Acute Rehab PT Goals(only PT should resolve) Goal: Pt will Roll Supine to Side Outcome: Progressing Flowsheets (Taken 09/21/2022 1629) Pt will Roll Supine to Side:  with min assist  with mod assist Goal: Pt Will Go Supine/Side To Sit Outcome: Progressing Flowsheets (Taken 09/21/2022 1629) Pt will go Supine/Side to Sit:  with min guard assist  with minimal assist Goal: Pt Will Go Sit To Supine/Side Outcome: Progressing Flowsheets (Taken 09/21/2022 1629) Pt will go Sit to Supine/Side:  with moderate assist  with minimal assist Goal: Patient Will Perform Sitting Balance Outcome: Progressing Flowsheets (Taken 09/21/2022 1629) Patient will perform sitting balance: with supervision Goal: Patient Will Transfer Sit To/From Stand Outcome: Progressing Flowsheets (Taken 09/21/2022 1629) Patient will transfer sit to/from stand: with moderate assist Goal: Pt Will Transfer Bed To Chair/Chair To Bed Outcome: Progressing Flowsheets (Taken 09/21/2022 1629) Pt will Transfer Bed to Chair/Chair to Bed: with mod assist Goal: Pt Will Perform Standing Balance Or Pre-Gait Outcome: Progressing   4:30 PM, 09/21/22 Lonell Grandchild, MPT Physical Therapist with [facility] 336 [phone] office [phone] mobile phone

## 2022-09-22 DIAGNOSIS — K5900 Constipation, unspecified: Secondary | ICD-10-CM | POA: Diagnosis not present

## 2022-09-22 DIAGNOSIS — M545 Low back pain, unspecified: Secondary | ICD-10-CM | POA: Diagnosis not present

## 2022-09-22 DIAGNOSIS — Z7189 Other specified counseling: Secondary | ICD-10-CM | POA: Diagnosis not present

## 2022-09-22 DIAGNOSIS — Z515 Encounter for palliative care: Secondary | ICD-10-CM | POA: Diagnosis not present

## 2022-09-22 DIAGNOSIS — E785 Hyperlipidemia, unspecified: Secondary | ICD-10-CM

## 2022-09-22 LAB — GLUCOSE, CAPILLARY
Glucose-Capillary: 212 mg/dL — ABNORMAL HIGH (ref 70–99)
Glucose-Capillary: 234 mg/dL — ABNORMAL HIGH (ref 70–99)
Glucose-Capillary: 149 mg/dL — ABNORMAL HIGH (ref 70–99)
Glucose-Capillary: 206 mg/dL — ABNORMAL HIGH (ref 70–99)

## 2022-09-22 MED ORDER — OXYCODONE HCL 5 MG PO TABS
5.0000 mg | ORAL_TABLET | ORAL | Status: DC | PRN
  Administered 2022-09-22 – 2022-09-24 (×3): 5 mg via ORAL
  Filled 2022-09-22 (×3): qty 1

## 2022-09-22 MED ORDER — FENTANYL CITRATE PF 50 MCG/ML IJ SOSY
12.5000 ug | PREFILLED_SYRINGE | INTRAMUSCULAR | Status: DC | PRN
  Administered 2022-09-22 – 2022-09-23 (×3): 12.5 ug via INTRAVENOUS
  Filled 2022-09-22 (×3): qty 1

## 2022-09-22 NOTE — TOC Progression Note (Signed)
Transition of Care [facility]) - Progression Note    Patient Details  Name: Kyle Schultz MRN: [id number] Date of Birth: 08/27/1929  Transition of Care [facility]) CM/SW Contact  Boneta Lucks, RN Phone Number: 09/22/2022, 1:09 PM  Clinical Narrative:   Discussed bed offers with his son, and started INS AUTH  earlier this morning. MD and [facility] updated. Patient is medically ready, TOC waiting on Auth.     Expected Discharge Plan: Skilled Nursing Facility Barriers to Discharge: Continued Medical Work up, No SNF bed, Insurance Authorization  Expected Discharge Plan and [place] arrangements for the past 2 months: Single Family Home                   Social Determinants of Health (SDOH) Interventions SDOH Screenings   Tobacco Use: Low Risk  (07/27/2022)    Readmission Risk Interventions    09/21/2022    2:59 PM  Readmission Risk Prevention Plan  Transportation Screening Complete  PCP or Specialist Appt within 5-7 Days Not Complete  Home Care Screening Complete  Medication Review (RN CM) Complete

## 2022-09-22 NOTE — Progress Notes (Signed)
PROGRESS NOTE    Kyle Schultz  [id number] DOB: [birth year] DOA: 09/18/2022 PCP: Carrolyn Meiers, MD   Brief Narrative:     Kyle Schultz is a 89 y.o. male with medical history significant for combined systolic and diastolic CHF, hypertension, permanent atrial fibrillation with cardiac pacemaker on Eliquis, CKD stage IV, type 2 diabetes, and dementia who presented to the ED with complaints of some low back pain.  He denies any complaints of chest pain or shortness of breath and was admitted with acute on chronic combined CHF exacerbation with possible non-STEMI.  2D echocardiogram 3/10 with LVEF 40-45% and some regional wall motion abnormalities that appear to be consistent with prior 2D echocardiogram in 2022.  Cardiology evaluation obtained and he is now on GDMT and they have signed off.  PT now recommending SNF and patient and family agreeable to placement.  Assessment & Plan:   Principal Problem:   NSTEMI (non-ST elevated myocardial infarction) ([place]) Active Problems:   Dyslipidemia   Essential hypertension, benign   Type 2 diabetes mellitus with other circulatory complications (HCC)   Mixed hyperlipidemia   Acute on chronic combined systolic and diastolic CHF (congestive heart failure) /EF 35 to 40 %   CKD (chronic kidney disease), stage IV (HCC)  Assessment and Plan:  Acute on chronic combined diastolic and CHF exacerbation with possible NSTEMI -Cardiology previously recommended medical therapy over invasive options given age and CKD -Now on GDMT with oral Lasix, metoprolol succinate, Entresto, and BiDil -Repeat 2D echocardiogram with some wall motion abnormalities as prior and LVEF 40-45% and appears stable -Cardiology evaluation appreciated and they have signed off   Fever with possible community-acquired pneumonia -Continue azithromycin and Rocephin for now -Procalcitonin 0.46 -Completed course of 5 days of antibiotics   Liver and kidney lesions on CT scan -Of  undetermined significance -Cannot have MRI due to pacemaker -Can follow-up outpatient to see if any repeat CT scanning is desired, but with advanced age, would not recommend any other form of follow-up   Hypertension -Continue on medications as above   Permanent atrial fibrillation/cardiac pacemaker -Resumed home Eliquis -DC cardiac monitoring   CKD stage IV -Continue to monitor closely while on diuresis   Type 2 diabetes with hyperglycemia -SSI and carb modified diet  CBG (last 3)  Recent Labs    09/22/22 1613 09/22/22 2120 09/23/22 0740  GLUCAP 149* 206* 183*   Dementia -Prior history of sundowning noted -Zyprexa at bedtime -Haldol as needed  Low back pain -Pain medications as needed and up in chair -PT evaluation recommending SNF  DVT prophylaxis: Eliquis Code Status: Full Family Communication: None at bedside Disposition Plan: Awaiting SNF placement, awaiting auth per TOC Status is: Inpatient Remains inpatient appropriate because: Need for IV medications  Consultants:  Cardiology  Procedures:  None  Antimicrobials:  Anti-infectives (From admission, onward)    Start     Dose/Rate Route Frequency Ordered Stop   09/20/22 0900  cefTRIAXone (ROCEPHIN) 1 g in sodium chloride 0.9 % 100 mL IVPB        1 g 200 mL/hr over 30 Minutes Intravenous Every 24 hours 09/19/22 1131 09/23/22 2359   09/19/22 0900  cefTRIAXone (ROCEPHIN) 1 g in sodium chloride 0.9 % 100 mL IVPB        1 g 200 mL/hr over 30 Minutes Intravenous  Once 09/19/22 0851 09/19/22 0937   09/19/22 0900  azithromycin (ZITHROMAX) 500 mg in sodium chloride 0.9 % 250 mL IVPB  500 mg 250 mL/hr over 60 Minutes Intravenous Every 24 hours 09/19/22 0851 09/23/22 2359      Subjective: Still waiting on SNF placement   Objective: Vitals:   09/21/22 1335 09/21/22 1900 09/22/22 0412 09/22/22 1443  BP: 126/81 (!) 138/58 (!) 146/51 (!) 153/95  Pulse: 66 66 63 66  Resp: 19 (!) 23 (!) 21 20  Temp: 98.1  F (36.7 C) 98.4 F (36.9 C) 98.2 F (36.8 C) 99.3 F (37.4 C)  TempSrc: Oral Oral Oral Axillary  SpO2: 100% 100% 98% 100%  Weight:   83.7 kg   Height:        Intake/Output Summary (Last 24 hours) at 09/22/2022 1715 Last data filed at 09/22/2022 1448 Gross per 24 hour  Intake 720 ml  Output 950 ml  Net -230 ml   Filed Weights   09/20/22 0600 09/21/22 0300 09/22/22 0412  Weight: 87.8 kg 83.3 kg 83.7 kg    Examination:  General exam: Appears calm and comfortable  Respiratory system: Clear to auscultation. Respiratory effort normal. Cardiovascular system: S1 & S2 heard, RRR.  Gastrointestinal system: Abdomen is soft Central nervous system: Alert and awake Extremities: No edema Skin: No significant lesions noted Psychiatry: Flat affect.  Data Reviewed: I have personally reviewed following labs and imaging studies  CBC: Recent Labs  Lab 09/19/22 0043 09/20/22 0320  WBC 8.0 6.9  NEUTROABS 5.7  --   HGB 9.7* 9.3*  HCT 31.3* 30.1*  MCV 81.7 81.4  PLT 185 [id number]   Basic Metabolic Panel: Recent Labs  Lab 09/19/22 0043 09/20/22 0320 09/21/22 0456  NA 138 138 138  K 3.9 3.8 3.7  CL 107 106 103  CO2 19* 20* 23  GLUCOSE 178* 162* 209*  BUN 40* 40* 50*  CREATININE 2.13* 1.96* 2.08*  CALCIUM 9.2 8.9 9.0  MG  --  1.8 2.0   GFR: Estimated Creatinine Clearance: 25.9 mL/min (A) (by C-G formula based on SCr of 2.08 mg/dL (H)). Liver Function Tests: Recent Labs  Lab 09/19/22 0043  AST 25  ALT 14  ALKPHOS 45  BILITOT 1.2  PROT 7.4  ALBUMIN 3.4*   Recent Labs  Lab 09/19/22 0043  LIPASE 27   No results for input(s): "AMMONIA" in the last 168 hours. Coagulation Profile: Recent Labs  Lab 09/19/22 0043  INR 1.8*   Cardiac Enzymes: No results for input(s): "CKTOTAL", "CKMB", "CKMBINDEX", "TROPONINI" in the last 168 hours. BNP (last 3 results) No results for input(s): "PROBNP" in the last 8760 hours. HbA1C: No results for input(s): "HGBA1C" in the last 72  hours.  CBG: Recent Labs  Lab 09/21/22 1552 09/21/22 1958 09/22/22 0730 09/22/22 1106 09/22/22 1613  GLUCAP 173* 241* 212* 234* 149*   Lipid Profile: No results for input(s): "CHOL", "HDL", "LDLCALC", "TRIG", "CHOLHDL", "LDLDIRECT" in the last 72 hours. Thyroid Function Tests: No results for input(s): "TSH", "T4TOTAL", "FREET4", "T3FREE", "THYROIDAB" in the last 72 hours. Anemia Panel: No results for input(s): "VITAMINB12", "FOLATE", "FERRITIN", "TIBC", "IRON", "RETICCTPCT" in the last 72 hours. Sepsis Labs: Recent Labs  Lab 09/19/22 0043 09/19/22 0344 09/19/22 1131  PROCALCITON  --   --  0.46  LATICACIDVEN 1.6 1.6  --     Recent Results (from the past 240 hour(s))  Blood Culture (routine x 2)     Status: None (Preliminary result)   Collection Time: 09/19/22 12:43 AM   Specimen: Right Antecubital; Blood  Result Value Ref Range Status   Specimen Description   Final  RIGHT ANTECUBITAL BOTTLES DRAWN AEROBIC AND ANAEROBIC Performed at [facility], [address], [place], [place] [address]    Special Requests   Final    Blood Culture adequate volume Performed at [facility], [address], [place], [place] [address]    Culture   Final    NO GROWTH 3 DAYS Performed at [facility], [place] [address] [place], [place] [address]    Report Status PENDING  Incomplete  Resp panel by RT-PCR (RSV, Flu A&B, Covid) Anterior Nasal Swab     Status: None   Collection Time: 09/19/22  1:00 AM   Specimen: Anterior Nasal Swab  Result Value Ref Range Status   SARS Coronavirus 2 by RT PCR NEGATIVE NEGATIVE Final    Comment: (NOTE) SARS-CoV-2 target nucleic acids are NOT DETECTED.  The SARS-CoV-2 RNA is generally detectable in upper respiratory specimens during the acute phase of infection. The lowest concentration of SARS-CoV-2 viral copies this assay can detect is 138 copies/mL. A negative result does not preclude SARS-Cov-2 infection and should not be used as the sole basis  for treatment or other patient management decisions. A negative result may occur with  improper specimen collection/handling, submission of specimen other than nasopharyngeal swab, presence of viral mutation(s) within the areas targeted by this assay, and inadequate number of viral copies(<138 copies/mL). A negative result must be combined with clinical observations, patient history, and epidemiological information. The expected result is Negative.  Fact Sheet for Patients:  EntrepreneurPulse.com.au  Fact Sheet for Healthcare Providers:  IncredibleEmployment.be  This test is no t yet approved or cleared by the [place] FDA and  has been authorized for detection and/or diagnosis of SARS-CoV-2 by FDA under an Emergency Use Authorization (EUA). This EUA will remain  in effect (meaning this test can be used) for the duration of the COVID-19 declaration under Section 564(b)(1) of the Act, 21 U.S.C.section 360bbb-3(b)(1), unless the authorization is terminated  or revoked sooner.       Influenza A by PCR NEGATIVE NEGATIVE Final   Influenza B by PCR NEGATIVE NEGATIVE Final    Comment: (NOTE) The Xpert Xpress SARS-CoV-2/FLU/RSV plus assay is intended as an aid in the diagnosis of influenza from Nasopharyngeal swab specimens and should not be used as a sole basis for treatment. Nasal washings and aspirates are unacceptable for Xpert Xpress SARS-CoV-2/FLU/RSV testing.  Fact Sheet for Patients: EntrepreneurPulse.com.au  Fact Sheet for Healthcare Providers: IncredibleEmployment.be  This test is not yet approved or cleared by the [place] FDA and has been authorized for detection and/or diagnosis of SARS-CoV-2 by FDA under an Emergency Use Authorization (EUA). This EUA will remain in effect (meaning this test can be used) for the duration of the COVID-19 declaration under Section 564(b)(1) of the Act, 21  U.S.C. section 360bbb-3(b)(1), unless the authorization is terminated or revoked.     Resp Syncytial Virus by PCR NEGATIVE NEGATIVE Final    Comment: (NOTE) Fact Sheet for Patients: EntrepreneurPulse.com.au  Fact Sheet for Healthcare Providers: IncredibleEmployment.be  This test is not yet approved or cleared by the [place] FDA and has been authorized for detection and/or diagnosis of SARS-CoV-2 by FDA under an Emergency Use Authorization (EUA). This EUA will remain in effect (meaning this test can be used) for the duration of the COVID-19 declaration under Section 564(b)(1) of the Act, 21 U.S.C. section 360bbb-3(b)(1), unless the authorization is terminated or revoked.  Performed at [facility], [address] [place],  [address]   Blood  Culture (routine x 2)     Status: None (Preliminary result)   Collection Time: 09/19/22  3:44 AM   Specimen: Right Antecubital; Blood  Result Value Ref Range Status   Specimen Description   Final    RIGHT ANTECUBITAL BOTTLES DRAWN AEROBIC AND ANAEROBIC Performed at [facility], [address] [place], [place] [address]    Special Requests   Final    Blood Culture adequate volume Performed at [facility], [address] [place], [place] [address]    Culture   Final    NO GROWTH 3 DAYS Performed at [facility], [place] [address], [place], [place] [address]    Report Status PENDING  Incomplete  MRSA Next Gen by PCR, Nasal     Status: None   Collection Time: 09/19/22  3:36 PM   Specimen: Nasal Mucosa; Nasal Swab  Result Value Ref Range Status   MRSA by PCR Next Gen NOT DETECTED NOT DETECTED Final    Comment: (NOTE) The GeneXpert MRSA Assay (FDA approved for NASAL specimens only), is one component of a comprehensive MRSA colonization surveillance program. It is not intended to diagnose MRSA infection nor to guide or monitor treatment for MRSA infections. Test performance is not FDA  approved in patients less than 72 years old. Performed at [facility], [address] [place], [place] [address]     Radiology Studies: No results found.   Scheduled Meds:  apixaban  2.5 mg Oral BID   Chlorhexidine Gluconate Cloth  6 each Topical Daily   furosemide  40 mg Oral BID   insulin aspart  0-15 Units Subcutaneous TID WC   insulin aspart  0-5 Units Subcutaneous QHS   isosorbide-hydrALAZINE  1 tablet Oral BID   metoprolol succinate  25 mg Oral Daily   simvastatin  20 mg Oral QHS   sodium chloride flush  3 mL Intravenous Q12H   Continuous Infusions:  sodium chloride     azithromycin 500 mg (09/22/22 0941)   cefTRIAXone (ROCEPHIN)  IV 1 g (09/22/22 1106)     LOS: 3 days   Time spent: 15 minutes  Jiayi Lengacher, MD [facility]  If 7PM-7AM, please contact night-coverage [email or web address] 09/22/2022, 5:15 PM

## 2022-09-22 NOTE — Progress Notes (Signed)
Daily Progress Note   Patient Name: Kyle Schultz       Date: 09/22/2022 DOB: [birth year]  Age: 89 y.o. MRN#: [id number] Attending Physician: Murlean Iba, MD Primary Care Physician: Carrolyn Meiers, MD Admit Date: 09/18/2022 Length of Stay: 3 days  Reason for Consultation/Follow-up: Establishing goals of care  HPI/Patient Profile:  89 y.o. male  with past medical history of combined systolic and diastolic CHF, hypertension, permanent atrial fibrillation with cardiac pacemaker on Eliquis, CKD stage IV, type 2 diabetes, and dementia who presented to the ED with complaints of some low back pain.  He was admitted on 09/18/2022 with acute on chronic combined systolic and diastolic CHF, rule out NSTEMI, fever with possible CAP, liver and kidney lesions, and others.     PMT was consulted for [place] conversations. No previous palliative care involvement.   Subjective:   Subjective: Chart Reviewed. Updates received. Patient Assessed. Created space and opportunity for patient  and family to explore thoughts and feelings regarding current medical situation.  Today's Discussion: Today I saw the patient at the bedside.  His 2 sisters were present.  He was sleeping and I elected to not wake him.  They state they just arrived recently and he has been sleeping since they got there.  He appeared comfortable and was in the bedside chair.  I called and spoke with the patient's son Kyle Schultz.  We had a pretty extensive discussion about advance care planning, the purpose of palliative care, and advance directives.  He states that he has not had any discussions with his father about advance care planning and his wishes such as CODE STATUS, feeding tube, extensive aggressive interventions.  We discussed the importance of having these discussions amongst family so that when patient is able to get to the point where they are facing the end of life then family is not caring the burden of "making decisions" for  the patient but rather just expressing their previously determined wishes.  We discussed that the patient during this admission has been somewhat confused and I did not feel comfortable having discussions with him at this time.  However, being that he is improving, there is no urgency to make decisions about CODE STATUS or goals of care today.  I have encouraged the patient to have this discussion with his father after he gets out of the hospital   I have encouraged the patient to have this discussion with his father after he gets out of the hospital and his mental status is back to baseline. He stated he agrees he thinks this is important and he will plan to do this.  I informed him I left copies of "hard choices for loving people" with his aunts and he is welcome to read this as he will not likely be at the bedside before patient discharge.  I also ensured he had a contact number for any questions or concerns while patient is admitted.  I provided emotional and general support through therapeutic listening, empathy, sharing of stories, and other techniques. I answered all questions and addressed all concerns to the best of my ability.   Review of Systems  Unable to perform ROS (Patient sleeping and I elected to not wake him)  Objective:   Vital Signs:  BP (!) 146/51 (BP Location: Right Arm)   Pulse 63   Temp 98.2 F (36.8 C) (Oral)   Resp (!) 21   Ht 6' (1.829 m)   Wt 83.7 kg  SpO2 98%   BMI 25.03 kg/m   Physical Exam: Physical Exam Vitals and nursing note reviewed.  Constitutional:      General: He is sleeping. He is not in acute distress. HENT:     Head: Normocephalic and atraumatic.  Pulmonary:     Effort: Pulmonary effort is normal. No respiratory distress.  Abdominal:     General: Abdomen is flat.  Skin:    General: Skin is warm and dry.     Palliative Assessment/Data: 20-30%    Existing Vynca/ACP Documentation: None  Assessment & Plan:   Impression: Present  on Admission:  NSTEMI (non-ST elevated myocardial infarction) ([place])  Acute on chronic combined systolic and diastolic CHF (congestive heart failure) /EF 35 to 40 %  CKD (chronic kidney disease), stage IV (HCC)  Dyslipidemia  Essential hypertension, benign  Mixed hyperlipidemia  Type 2 diabetes mellitus with other circulatory complications (HCC)  SUMMARY OF RECOMMENDATIONS   Continue full code, full scope of care Anticipate discharge to SNF/rehab after insurance Auth and bed offers Encouraged family to have goals of care discussions as outpatient PMT will back off for now Please notify us of any significant clinical change or new palliative needs  Symptom Management:  Per primary team PMT is available to assist as needed  Code Status: Full code  Prognosis: Unable to determine  Discharge Planning: To Be Determined  Discussed with: Patient's family, medical team, nursing team  Thank you for allowing [place] to participate in the care of Kyle Schultz PMT will continue to support holistically.  Time Total: 60 min  Visit consisted of counseling and education dealing with the complex and emotionally intense issues of symptom management and palliative care in the setting of serious and potentially life-threatening illness. Greater than 50%  of this time was spent counseling and coordinating care related to the above assessment and plan.  Kyle Field, NP Palliative Medicine Team  Team Phone # [phone] (Nights/Weekends)  03/10/2021, 8:17 AM

## 2022-09-23 ENCOUNTER — Encounter (HOSPITAL_COMMUNITY): Payer: Self-pay | Admitting: Internal Medicine

## 2022-09-23 ENCOUNTER — Inpatient Hospital Stay (HOSPITAL_COMMUNITY): Payer: Medicare Other

## 2022-09-23 DIAGNOSIS — I10 Essential (primary) hypertension: Secondary | ICD-10-CM

## 2022-09-23 DIAGNOSIS — E782 Mixed hyperlipidemia: Secondary | ICD-10-CM

## 2022-09-23 DIAGNOSIS — E1159 Type 2 diabetes mellitus with other circulatory complications: Secondary | ICD-10-CM

## 2022-09-23 LAB — RESP PANEL BY RT-PCR (RSV, FLU A&B, COVID)  RVPGX2
Influenza A by PCR: NEGATIVE
Influenza B by PCR: NEGATIVE
Resp Syncytial Virus by PCR: NEGATIVE
SARS Coronavirus 2 by RT PCR: NEGATIVE

## 2022-09-23 LAB — URINALYSIS, ROUTINE W REFLEX MICROSCOPIC
Bilirubin Urine: NEGATIVE
Glucose, UA: NEGATIVE mg/dL
Ketones, ur: NEGATIVE mg/dL
Nitrite: NEGATIVE
Specific Gravity, Urine: 1.02 (ref 1.005–1.030)
pH: 5.5 (ref 5.0–8.0)

## 2022-09-23 LAB — CBC
HCT: 28.9 % — ABNORMAL LOW (ref 39.0–52.0)
Hemoglobin: 9 g/dL — ABNORMAL LOW (ref 13.0–17.0)
MCH: 25.4 pg — ABNORMAL LOW (ref 26.0–34.0)
MCHC: 31.1 g/dL (ref 30.0–36.0)
MCV: 81.6 fL (ref 80.0–100.0)
Platelets: 214 10*3/uL (ref 150–400)
RBC: 3.54 MIL/uL — ABNORMAL LOW (ref 4.22–5.81)
RDW: 15.9 % — ABNORMAL HIGH (ref 11.5–15.5)
WBC: 6.9 10*3/uL (ref 4.0–10.5)
nRBC: 0 % (ref 0.0–0.2)

## 2022-09-23 LAB — GLUCOSE, CAPILLARY
Glucose-Capillary: 183 mg/dL — ABNORMAL HIGH (ref 70–99)
Glucose-Capillary: 224 mg/dL — ABNORMAL HIGH (ref 70–99)
Glucose-Capillary: 228 mg/dL — ABNORMAL HIGH (ref 70–99)
Glucose-Capillary: 201 mg/dL — ABNORMAL HIGH (ref 70–99)

## 2022-09-23 LAB — URINALYSIS, MICROSCOPIC (REFLEX)
RBC / HPF: >50 RBC/hpf (ref 0–5)
WBC, UA: >50 WBC/hpf (ref 0–5)

## 2022-09-23 LAB — PROCALCITONIN: Procalcitonin: 1.04 ng/mL

## 2022-09-23 MED ORDER — POTASSIUM CHLORIDE CRYS ER 10 MEQ PO TBCR: 10.0000 meq | EXTENDED_RELEASE_TABLET | Freq: Every day | ORAL | Status: DC

## 2022-09-23 MED ORDER — SODIUM CHLORIDE 0.9 % IV SOLN: 100.0000 mg | Freq: Two times a day (BID) | INTRAVENOUS | Status: DC

## 2022-09-23 MED ORDER — OLANZAPINE 2.5 MG PO TABS: 2.5000 mg | ORAL_TABLET | Freq: Every evening | ORAL | 2 refills | Status: DC | PRN

## 2022-09-23 MED ORDER — INSULIN GLARGINE 100 UNIT/ML ~~LOC~~ SOLN: 5.0000 [IU] | Freq: Every day | SUBCUTANEOUS | 11 refills | Status: DC

## 2022-09-23 MED ORDER — SODIUM CHLORIDE 0.9 % IV SOLN
100.0000 mg | Freq: Two times a day (BID) | INTRAVENOUS | Status: DC
  Filled 2022-09-23 (×2): qty 100

## 2022-09-23 MED ORDER — ACETAMINOPHEN 325 MG PO TABS
650.0000 mg | ORAL_TABLET | Freq: Four times a day (QID) | ORAL | Status: DC
  Administered 2022-09-23 – 2022-09-24 (×4): 650 mg via ORAL
  Filled 2022-09-23 (×4): qty 2

## 2022-09-23 MED ORDER — DOXYCYCLINE HYCLATE 100 MG PO TABS
100.0000 mg | ORAL_TABLET | Freq: Two times a day (BID) | ORAL | Status: DC
  Administered 2022-09-24: 100 mg via ORAL
  Filled 2022-09-23: qty 1

## 2022-09-23 MED ORDER — SODIUM CHLORIDE 0.9 % IV SOLN
2.0000 g | INTRAVENOUS | Status: DC
  Administered 2022-09-23: 2 g via INTRAVENOUS
  Filled 2022-09-23: qty 20

## 2022-09-23 MED ORDER — LINAGLIPTIN 5 MG PO TABS: 5.0000 mg | ORAL_TABLET | Freq: Every day | ORAL | Status: DC

## 2022-09-23 MED ORDER — ACETAMINOPHEN 650 MG RE SUPP: 650.0000 mg | Freq: Four times a day (QID) | RECTAL | Status: DC

## 2022-09-23 MED ORDER — FUROSEMIDE 40 MG PO TABS: 40.0000 mg | ORAL_TABLET | Freq: Two times a day (BID) | ORAL | 2 refills | Status: DC

## 2022-09-23 NOTE — Progress Notes (Signed)
Physical Therapy Treatment Patient Details Name: Kyle Schultz MRN: [id number] DOB: 1929/11/11 Today's Date: 09/23/2022   History of Present Illness Kyle Schultz is a 89 y.o. male with medical history significant for combined systolic and diastolic CHF, hypertension, permanent atrial fibrillation with cardiac pacemaker on Eliquis, CKD stage IV, type 2 diabetes, and dementia who presented to the ED with complaints of some low back pain.  He states that his hips also bother him and it is difficult to obtain further history from him as he does not say very much.  He denies any chest pain or shortness of breath however.  He was also noted to be febrile on admission.    PT Comments    Patient demonstrates slow labored movement for sitting up at bedside with frequent leaning fall over to the left, after stretching low back patient demonstrated improvement for keeping trunk in midline.  Patient demonstrates fair/good return for completing BLE ROM/strengthening exercises with verbal cueing while seated at EOB, had most difficulty scooting to EOB due to scrotal discomfort and unable to stand or transfer to chair due to weakness after repeated attempts using RW and stand pivoting.  Patient demonstrates good return for using BUE to help pull self up in bed when repositioning.  Patient will benefit from continued skilled physical therapy in hospital and recommended venue below to increase strength, balance, endurance for safe ADLs and gait.      Recommendations for follow up therapy are one component of a multi-disciplinary discharge planning process, led by the attending physician.  Recommendations may be updated based on patient status, additional functional criteria and insurance authorization.  Follow Up Recommendations  Skilled nursing-short term rehab (<3 hours/day) Can patient physically be transported by private vehicle: No   Assistance Recommended at Discharge Set up Supervision/Assistance  Patient  can return home with the following A lot of help with bathing/dressing/bathroom;A lot of help with walking and/or transfers;Help with stairs or ramp for entrance;Assistance with cooking/housework   Equipment Recommendations  None recommended by PT    Recommendations for Other Services       Precautions / Restrictions Precautions Precautions: Fall Restrictions Weight Bearing Restrictions: No     Mobility  Bed Mobility Overal bed mobility: Needs Assistance Bed Mobility: Supine to Sit     Supine to sit: Max assist Sit to supine: Mod assist, Max assist   General bed mobility comments: slow labored moveent with difficulty maintaining sitting balance, frequent leaning to the left    Transfers                        Ambulation/Gait                   Stairs             Wheelchair Mobility    Modified Rankin (Stroke Patients Only)       Balance Overall balance assessment: Needs assistance Sitting-balance support: Feet supported, Bilateral upper extremity supported Sitting balance-Leahy Scale: Poor Sitting balance - Comments: seated at EOB Postural control: Left lateral lean                                  Cognition Arousal/Alertness: Awake/alert Behavior During Therapy: WFL for tasks assessed/performed Overall Cognitive Status: History of cognitive impairments - at baseline  Exercises General Exercises - Lower Extremity Long Arc Quad: Seated, AROM, Strengthening, Both, 10 reps Hip Flexion/Marching: Seated, AROM, Strengthening, Both, 10 reps Toe Raises: Seated, AROM, Strengthening, Both, 15 reps Heel Raises: Seated, AROM, Strengthening, Both, 15 reps    General Comments        Pertinent Vitals/Pain Pain Assessment Pain Assessment: Faces Faces Pain Scale: Hurts even more Pain Location: bilateral knees when fully flexed Pain Descriptors / Indicators: Discomfort,  Grimacing, Guarding, Sore Pain Intervention(s): Limited activity within patient's tolerance, Monitored during session, Repositioned    Home Living                          Prior Function            PT Goals (current goals can now be found in the care plan section) Acute Rehab PT Goals Patient Stated Goal: return home after rehab PT Goal Formulation: With patient Time For Goal Achievement: 10/05/22 Potential to Achieve Goals: Good Progress towards PT goals: Progressing toward goals    Frequency    Min 3X/week      PT Plan Current plan remains appropriate    Co-evaluation              AM-PAC PT "6 Clicks" Mobility   Outcome Measure  Help needed turning from your back to your side while in a flat bed without using bedrails?: A Lot Help needed moving from lying on your back to sitting on the side of a flat bed without using bedrails?: A Lot Help needed moving to and from a bed to a chair (including a wheelchair)?: Total Help needed standing up from a chair using your arms (e.g., wheelchair or bedside chair)?: Total Help needed to walk in hospital room?: Total Help needed climbing 3-5 steps with a railing? : Total 6 Click Score: 8    End of Session   Activity Tolerance: Patient tolerated treatment well;Patient limited by fatigue;Patient limited by pain Patient left: in bed;with call bell/phone within reach Nurse Communication: Mobility status PT Visit Diagnosis: Unsteadiness on feet (R26.81);Other abnormalities of gait and mobility (R26.89);Muscle weakness (generalized) (M62.81)     Time: [id number] PT Time Calculation (min) (ACUTE ONLY): 32 min  Charges:  $Therapeutic Exercise: 8-22 mins $Therapeutic Activity: 8-22 mins                     11:04 AM, 09/23/22 Lonell Grandchild, MPT Physical Therapist with [facility] [phone] office [phone] mobile phone

## 2022-09-23 NOTE — TOC Transition Note (Signed)
Transition of Care [facility]) - CM/SW Discharge Note   Patient Details  Name: RAEL PHI MRN: [id number] Date of Birth: 17-Oct-1929  [facility] CM/SW Contact:  Boneta Lucks, RN Phone Number: 09/23/2022, 1:05 PM   Clinical Narrative:   [employer] approved [id number], next review 3/18. Ebony Hail updated. Patient ready to discharge to [facility]. Family updated and given address. RN will call report, TOC will call EMS when RN is ready.   Final next level of care: Skilled Nursing Facility Barriers to Discharge: Barriers Resolved   Patient Goals and CMS Choice CMS Medicare.gov Compare Post Acute Care list provided to:: Patient Represenative (must comment) Choice offered to / list presented to : Sibling  Discharge Placement      Patient and family notified of of transfer: 09/21/22  Discharge Plan and Services Additional resources added to the After Visit Summary for       Social Determinants of Health (SDOH) Interventions SDOH Screenings   Food Insecurity: No Food Insecurity (09/23/2022)  Housing: [place]  (09/23/2022)  Transportation Needs: No Transportation Needs (09/23/2022)  Utilities: Not At Risk (09/23/2022)  Tobacco Use: Low Risk  (09/23/2022)     Readmission Risk Interventions    09/21/2022    2:59 PM  Readmission Risk Prevention Plan  Transportation Screening Complete  PCP or Specialist Appt within 5-7 Days Not Complete  Home Care Screening Complete  Medication Review (RN CM) Complete

## 2022-09-23 NOTE — TOC Progression Note (Signed)
Transition of Care [facility]) - Progression Note    Patient Details  Name: Kyle Schultz MRN: [id number] Date of Birth: Sep 05, 1929  [facility]) CM/SW Contact  Boneta Lucks, RN Phone Number: 09/23/2022, 11:17 AM  Clinical Narrative:   Everlene Balls requesting Progress and PT notes.    Expected Discharge Plan: [place] Barriers to Discharge: Continued Medical Work up, No SNF bed, Insurance Authorization  Expected Discharge Plan and [place] arrangements for the past 2 months: Single Family Home                      Social Determinants of Health (SDOH) Interventions SDOH Screenings   Food Insecurity: No Food Insecurity (09/23/2022)  Housing: [place]  (09/23/2022)  Transportation Needs: No Transportation Needs (09/23/2022)  Utilities: Not At Risk (09/23/2022)  Tobacco Use: Low Risk  (09/23/2022)    Readmission Risk Interventions    09/21/2022    2:59 PM  Readmission Risk Prevention Plan  Transportation Screening Complete  PCP or Specialist Appt within 5-7 Days Not Complete  Home Care Screening Complete  Medication Review (RN CM) Complete

## 2022-09-23 NOTE — Progress Notes (Signed)
PROGRESS NOTE    Kyle Schultz  [id number] DOB: 04-19-1930 DOA: 09/18/2022 PCP: Carrolyn Meiers, MD   Brief Narrative:    Kyle Schultz is a 89 y.o. male with medical history significant for combined systolic and diastolic CHF, hypertension, permanent atrial fibrillation with cardiac pacemaker on Eliquis, CKD stage IV, type 2 diabetes, and dementia who presented to the ED with complaints of some low back pain.  He denies any complaints of chest pain or shortness of breath and was admitted with acute on chronic combined CHF exacerbation with possible non-STEMI.  2D echocardiogram 3/10 with LVEF 40-45% and some regional wall motion abnormalities that appear to be consistent with prior 2D echocardiogram in 2022.  Cardiology evaluation obtained and he is now on GDMT and they have signed off.  PT now recommending SNF and patient and family agreeable to placement.  Assessment & Plan:   Principal Problem:   NSTEMI (non-ST elevated myocardial infarction) ([place]) Active Problems:   Dyslipidemia   Essential hypertension, benign   Type 2 diabetes mellitus with other circulatory complications (HCC)   Mixed hyperlipidemia   Acute on chronic combined systolic and diastolic CHF (congestive heart failure) /EF 35 to 40 %   CKD (chronic kidney disease), stage IV (HCC)  Assessment and Plan:  Acute on chronic combined diastolic and CHF exacerbation with possible NSTEMI -Cardiology previously recommended medical therapy over invasive options given age and CKD -Now on GDMT with oral Lasix, metoprolol succinate, Entresto, and BiDil -Repeat 2D echocardiogram with some wall motion abnormalities as prior and LVEF 40-45% and appears stable -Cardiology evaluation appreciated and they have signed off   Fever with possible community-acquired pneumonia -Continue azithromycin and Rocephin for now -Procalcitonin 0.46 -Completed course of 5 days of antibiotics   Liver and kidney lesions on CT scan -Of  undetermined significance -Cannot have MRI due to pacemaker -Can follow-up outpatient to see if any repeat CT scanning is desired, but with advanced age, would not recommend any other form of follow-up   Hypertension -Continue on medications as above   Permanent atrial fibrillation/cardiac pacemaker -Resumed home apixaban -DC cardiac monitoring   CKD stage IV -Continue to monitor closely while on diuresis   Type 2 diabetes with hyperglycemia -SSI and carb modified diet  CBG (last 3)  Recent Labs    09/22/22 1613 09/22/22 2120 09/23/22 0740  GLUCAP 149* 206* 183*   Dementia -Prior history of sundowning noted -Zyprexa at bedtime -Haldol as needed but has not been recently required   Low back pain -Pain medications as needed and up in chair -PT evaluation recommending SNF  DVT prophylaxis: apixaban Code Status: Full Family Communication: None at bedside Disposition Plan: Awaiting SNF placement, awaiting auth per TOC Status is: Inpatient Remains inpatient appropriate because: Need for IV medications  Consultants:  Cardiology  Procedures:  None  Antimicrobials:  Anti-infectives (From admission, onward)    Start     Dose/Rate Route Frequency Ordered Stop   09/20/22 0900  cefTRIAXone (ROCEPHIN) 1 g in sodium chloride 0.9 % 100 mL IVPB        1 g 200 mL/hr over 30 Minutes Intravenous Every 24 hours 09/19/22 1131 09/23/22 1021   09/19/22 0900  cefTRIAXone (ROCEPHIN) 1 g in sodium chloride 0.9 % 100 mL IVPB        1 g 200 mL/hr over 30 Minutes Intravenous  Once 09/19/22 0851 09/19/22 0937   09/19/22 0900  azithromycin (ZITHROMAX) 500 mg in sodium chloride 0.9 % 250 mL  IVPB        500 mg 250 mL/hr over 60 Minutes Intravenous Every 24 hours 09/19/22 0851 09/23/22 1122      Subjective: Still waiting on SNF placement and insurance authorization  Objective: Vitals:   09/22/22 2310 09/23/22 0319 09/23/22 0535 09/23/22 0936  BP:   (!) 125/56 (!) 150/58  Pulse:   60  (!) 59  Resp: 17     Temp: 97.7 F (36.5 C)  98.2 F (36.8 C)   TempSrc: Oral  Oral   SpO2:   100%   Weight:  88.1 kg    Height:        Intake/Output Summary (Last 24 hours) at 09/23/2022 1140 Last data filed at 09/23/2022 0939 Gross per 24 hour  Intake 1286 ml  Output 1250 ml  Net 36 ml   Filed Weights   09/21/22 0300 09/22/22 0412 09/23/22 0319  Weight: 83.3 kg 83.7 kg 88.1 kg    Examination:  General exam: Appears calm and comfortable, somnolent but arousable.  Respiratory system: Clear to auscultation. Respiratory effort normal. Cardiovascular system: S1 & S2 heard. No murmur heard.   Gastrointestinal system: Abdomen is soft, ND/NT, no HSM.  Central nervous system: Alert and awake Extremities: No edema Skin: No significant lesions noted Psychiatry: Flat affect.  Data Reviewed: I have personally reviewed following labs and imaging studies  CBC: Recent Labs  Lab 09/19/22 0043 09/20/22 0320  WBC 8.0 6.9  NEUTROABS 5.7  --   HGB 9.7* 9.3*  HCT 31.3* 30.1*  MCV 81.7 81.4  PLT 185 [id number]   Basic Metabolic Panel: Recent Labs  Lab 09/19/22 0043 09/20/22 0320 09/21/22 0456  NA 138 138 138  K 3.9 3.8 3.7  CL 107 106 103  CO2 19* 20* 23  GLUCOSE 178* 162* 209*  BUN 40* 40* 50*  CREATININE 2.13* 1.96* 2.08*  CALCIUM 9.2 8.9 9.0  MG  --  1.8 2.0   GFR: Estimated Creatinine Clearance: 25.9 mL/min (A) (by C-G formula based on SCr of 2.08 mg/dL (H)). Liver Function Tests: Recent Labs  Lab 09/19/22 0043  AST 25  ALT 14  ALKPHOS 45  BILITOT 1.2  PROT 7.4  ALBUMIN 3.4*   Recent Labs  Lab 09/19/22 0043  LIPASE 27   No results for input(s): "AMMONIA" in the last 168 hours. Coagulation Profile: Recent Labs  Lab 09/19/22 0043  INR 1.8*   Cardiac Enzymes: No results for input(s): "CKTOTAL", "CKMB", "CKMBINDEX", "TROPONINI" in the last 168 hours. BNP (last 3 results) No results for input(s): "PROBNP" in the last 8760 hours. HbA1C: No results for  input(s): "HGBA1C" in the last 72 hours.  CBG: Recent Labs  Lab 09/22/22 0730 09/22/22 1106 09/22/22 1613 09/22/22 2120 09/23/22 0740  GLUCAP 212* 234* 149* 206* 183*   Lipid Profile: No results for input(s): "CHOL", "HDL", "LDLCALC", "TRIG", "CHOLHDL", "LDLDIRECT" in the last 72 hours. Thyroid Function Tests: No results for input(s): "TSH", "T4TOTAL", "FREET4", "T3FREE", "THYROIDAB" in the last 72 hours. Anemia Panel: No results for input(s): "VITAMINB12", "FOLATE", "FERRITIN", "TIBC", "IRON", "RETICCTPCT" in the last 72 hours. Sepsis Labs: Recent Labs  Lab 09/19/22 0043 09/19/22 0344 09/19/22 1131  PROCALCITON  --   --  0.46  LATICACIDVEN 1.6 1.6  --     Recent Results (from the past 240 hour(s))  Blood Culture (routine x 2)     Status: None (Preliminary result)   Collection Time: 09/19/22 12:43 AM   Specimen: Right Antecubital; Blood  Result Value Ref  Range Status   Specimen Description   Final    RIGHT ANTECUBITAL BOTTLES DRAWN AEROBIC AND ANAEROBIC   Special Requests Blood Culture adequate volume  Final   Culture   Final    NO GROWTH 4 DAYS Performed at [facility], [address] [place], [place] [address]    Report Status PENDING  Incomplete  Resp panel by RT-PCR (RSV, Flu A&B, Covid) Anterior Nasal Swab     Status: None   Collection Time: 09/19/22  1:00 AM   Specimen: Anterior Nasal Swab  Result Value Ref Range Status   SARS Coronavirus 2 by RT PCR NEGATIVE NEGATIVE Final    Comment: (NOTE) SARS-CoV-2 target nucleic acids are NOT DETECTED.  The SARS-CoV-2 RNA is generally detectable in upper respiratory specimens during the acute phase of infection. The lowest concentration of SARS-CoV-2 viral copies this assay can detect is 138 copies/mL. A negative result does not preclude SARS-Cov-2 infection and should not be used as the sole basis for treatment or other patient management decisions. A negative result may occur with  improper specimen  collection/handling, submission of specimen other than nasopharyngeal swab, presence of viral mutation(s) within the areas targeted by this assay, and inadequate number of viral copies(<138 copies/mL). A negative result must be combined with clinical observations, patient history, and epidemiological information. The expected result is Negative.  Fact Sheet for Patients:  EntrepreneurPulse.com.au  Fact Sheet for Healthcare Providers:  IncredibleEmployment.be  This test is no t yet approved or cleared by the [place] FDA and  has been authorized for detection and/or diagnosis of SARS-CoV-2 by FDA under an Emergency Use Authorization (EUA). This EUA will remain  in effect (meaning this test can be used) for the duration of the COVID-19 declaration under Section 564(b)(1) of the Act, 21 U.S.C.section 360bbb-3(b)(1), unless the authorization is terminated  or revoked sooner.       Influenza A by PCR NEGATIVE NEGATIVE Final   Influenza B by PCR NEGATIVE NEGATIVE Final    Comment: (NOTE) The Xpert Xpress SARS-CoV-2/FLU/RSV plus assay is intended as an aid in the diagnosis of influenza from Nasopharyngeal swab specimens and should not be used as a sole basis for treatment. Nasal washings and aspirates are unacceptable for Xpert Xpress SARS-CoV-2/FLU/RSV testing.  Fact Sheet for Patients: EntrepreneurPulse.com.au  Fact Sheet for Healthcare Providers: IncredibleEmployment.be  This test is not yet approved or cleared by the [place] FDA and has been authorized for detection and/or diagnosis of SARS-CoV-2 by FDA under an Emergency Use Authorization (EUA). This EUA will remain in effect (meaning this test can be used) for the duration of the COVID-19 declaration under Section 564(b)(1) of the Act, 21 U.S.C. section 360bbb-3(b)(1), unless the authorization is terminated or revoked.     Resp Syncytial  Virus by PCR NEGATIVE NEGATIVE Final    Comment: (NOTE) Fact Sheet for Patients: EntrepreneurPulse.com.au  Fact Sheet for Healthcare Providers: IncredibleEmployment.be  This test is not yet approved or cleared by the [place] FDA and has been authorized for detection and/or diagnosis of SARS-CoV-2 by FDA under an Emergency Use Authorization (EUA). This EUA will remain in effect (meaning this test can be used) for the duration of the COVID-19 declaration under Section 564(b)(1) of the Act, 21 U.S.C. section 360bbb-3(b)(1), unless the authorization is terminated or revoked.  Performed at [facility], [address] [place], [place] [address]   Blood Culture (routine x 2)     Status: None (Preliminary result)   Collection Time: 09/19/22  3:44 AM   Specimen: Right Antecubital; Blood  Result Value Ref Range Status   Specimen Description   Final    RIGHT ANTECUBITAL BOTTLES DRAWN AEROBIC AND ANAEROBIC   Special Requests Blood Culture adequate volume  Final   Culture   Final    NO GROWTH 4 DAYS Performed at [facility], [address], [place], [place] [address]    Report Status PENDING  Incomplete  MRSA Next Gen by PCR, Nasal     Status: None   Collection Time: 09/19/22  3:36 PM   Specimen: Nasal Mucosa; Nasal Swab  Result Value Ref Range Status   MRSA by PCR Next Gen NOT DETECTED NOT DETECTED Final    Comment: (NOTE) The GeneXpert MRSA Assay (FDA approved for NASAL specimens only), is one component of a comprehensive MRSA colonization surveillance program. It is not intended to diagnose MRSA infection nor to guide or monitor treatment for MRSA infections. Test performance is not FDA approved in patients less than 56 years old. Performed at [facility], [address], [place], [place] [address]     Radiology Studies: No results found.   Scheduled Meds:  apixaban  2.5 mg Oral BID   Chlorhexidine Gluconate Cloth  6 each Topical Daily    furosemide  40 mg Oral BID   insulin aspart  0-15 Units Subcutaneous TID WC   insulin aspart  0-5 Units Subcutaneous QHS   isosorbide-hydrALAZINE  1 tablet Oral BID   metoprolol succinate  25 mg Oral Daily   simvastatin  20 mg Oral QHS   sodium chloride flush  3 mL Intravenous Q12H   Continuous Infusions:  sodium chloride       LOS: 4 days   Time spent: 15 minutes  Tasean Mancha, MD [facility]  If 7PM-7AM, please contact night-coverage [email or web address] 09/23/2022, 11:40 AM

## 2022-09-23 NOTE — Discharge Summary (Addendum)
Physician Discharge Summary  Kyle Schultz [id number] DOB: [birth year] DOA: 09/18/2022  PCP: Carrolyn Meiers, MD Cardiologist: Dr. Dellia Cloud  Admit date: 09/18/2022 Discharge date: 09/24/2022  Disposition:  SNF   Recommendations for Outpatient Follow-up:  Follow up with cardiologist on 10/14/22 as scheduled  Please obtain BMP in 1 week to follow up renal function and electrolytes Please check blood sugar at least 3 times per day and as needed if not feeling well  Recommend outpatient palliative medicine consultation  Please remove foley in 1 week and do a voiding trial   Discharge Condition: STABLE   CODE STATUS: Full DIET: Heart Healthy Carb Modified    Brief Hospitalization Summary: Please see all hospital notes, images, labs for full details of the hospitalization. ADMISSION HPI:  89 y.o. male with medical history significant for combined systolic and diastolic CHF, hypertension, permanent atrial fibrillation with cardiac pacemaker on Eliquis, CKD stage IV, type 2 diabetes, and dementia who presented to the ED with complaints of some low back pain.  He denies any complaints of chest pain or shortness of breath and was admitted with acute on chronic combined CHF exacerbation with possible non-STEMI.  2D echocardiogram 3/10 with LVEF 40-45% and some regional wall motion abnormalities that appear to be consistent with prior 2D echocardiogram in 2022.  Cardiology evaluation obtained and he is now on GDMT and they have signed off.  PT now recommending SNF and patient and family agreeable to placement.   Hospital Course by problem   Acute on chronic combined diastolic and CHF exacerbation with possible NSTEMI -Cardiology previously recommended medical therapy over invasive options given age and CKD -Now on GDMT with oral Lasix, metoprolol succinate, Entresto, and BiDil -Repeat 2D echocardiogram with some wall motion abnormalities as prior and LVEF 40-45% and appears stable -Cardiology  evaluation appreciated and they have signed off -he is medically managed and has follow up with Dr. Dellia Cloud on 10/15/22    Fever with community-acquired pneumonia -he was treated with a course of  zithromycin and ceftriaxone -DC on 3 more days of oral doxycycline -he had another fever on 3/14 prior to discharge, we held DC and got CXR, cultures, urinalysis and monitored overnight.  He has remained stable with no further fever; Will DC to SNF today; cultures no growth to date.  Acetaminophen ordered.      Liver and kidney lesions on CT scan -Of undetermined significance -Cannot have MRI due to pacemaker -Can follow-up outpatient to see if any repeat CT scanning is desired, but with advanced age, would not recommend any other form of follow-up   Hypertension -Continue on medications as above   Permanent atrial fibrillation/cardiac pacemaker -Resumed home apixaban -DC cardiac monitoring   CKD stage IV -Continue to monitor closely while on diuresis -renal function has been stable.    Type 2 diabetes with hyperglycemia -SSI and carb modified diet CBG (last 3)  Recent Labs    09/23/22 1651 09/23/22 2044 09/24/22 0724  GLUCAP 228* 201* 209*  -DC on tradjenta 5 mg daily, lantus insulin 5 units daily -please check BS at least 3 times daily   Dementia -Prior history of sundowning noted -Zyprexa at bedtime PRN -Haldol as needed but has not been recently required    Low back pain -Pain medications as needed and up in chair -PT evaluation recommending SNF   Discharge Diagnoses:  Principal Problem:   NSTEMI (non-ST elevated myocardial infarction) [facility]) Active Problems:   Dyslipidemia   Essential hypertension, benign  Type 2 diabetes mellitus with other circulatory complications (HCC)   Mixed hyperlipidemia   Acute on chronic combined systolic and diastolic CHF (congestive heart failure) /EF 35 to 40 %   CKD (chronic kidney disease), stage IV [facility])   Discharge  Instructions:  Allergies as of 09/24/2022       Reactions   Other    "some kind of fluid pill I can't take"         Medication List     STOP taking these medications    glimepiride 2 MG tablet Commonly known as: Amaryl   glipiZIDE 5 MG tablet Commonly known as: GLUCOTROL       TAKE these medications    acetaminophen 325 MG tablet Commonly known as: TYLENOL Take 2 tablets (650 mg total) by mouth in the morning, at noon, and at bedtime. What changed:  when to take this reasons to take this   apixaban 2.5 MG Tabs tablet Commonly known as: ELIQUIS Take 1 tablet (2.5 mg total) by mouth 2 (two) times daily.   docusate sodium 100 MG capsule Commonly known as: COLACE Take 100 mg by mouth daily as needed for mild constipation.   doxycycline 100 MG tablet Commonly known as: VIBRA-TABS Take 1 tablet (100 mg total) by mouth every 12 (twelve) hours for 3 days.   furosemide 40 MG tablet Commonly known as: LASIX Take 1 tablet (40 mg total) by mouth 2 (two) times daily. What changed: when to take this   insulin glargine 100 UNIT/ML injection Commonly known as: Lantus Inject 0.05 mLs (5 Units total) into the skin daily.   isosorbide-hydrALAZINE 20-37.5 MG tablet Commonly known as: BIDIL Take 1 tablet by mouth 2 (two) times daily.   linagliptin 5 MG Tabs tablet Commonly known as: TRADJENTA Take 1 tablet (5 mg total) by mouth daily.   losartan 100 MG tablet Commonly known as: COZAAR Take 100 mg by mouth daily.   metoprolol succinate 25 MG 24 hr tablet Commonly known as: TOPROL-XL Take 1 tablet (25 mg total) by mouth daily.   OLANZapine 2.5 MG tablet Commonly known as: ZyPREXA Take 1 tablet (2.5 mg total) by mouth at bedtime as needed (insomnia/restlessness/agitation). What changed: reasons to take this   potassium chloride 10 MEQ tablet Commonly known as: KLOR-CON M Take 1 tablet (10 mEq total) by mouth daily.   simvastatin 20 MG tablet Commonly known as:  ZOCOR Take 20 mg by mouth at bedtime.        Contact information for follow-up providers     Mallipeddi, Quenten Raven, MD Follow up on 10/14/2022.   Specialties: Cardiology, Internal Medicine Why: Cardiology Hospital Follow-up on 10/14/2022 at 8:40 AM. Contact information: [address] [place] [address] [phone]              Contact information for after-discharge care     Destination     [facility] .   Service: Skilled Nursing Contact information: [address] [place] [address] [phone]                    Allergies  Allergen Reactions   Other     "some kind of fluid pill I can't take"    Allergies as of 09/24/2022       Reactions   Other    "some kind of fluid pill I can't take"         Medication List     STOP taking  these medications    glimepiride 2 MG tablet Commonly known as: Amaryl   glipiZIDE 5 MG tablet Commonly known as: GLUCOTROL       TAKE these medications    acetaminophen 325 MG tablet Commonly known as: TYLENOL Take 2 tablets (650 mg total) by mouth in the morning, at noon, and at bedtime. What changed:  when to take this reasons to take this   apixaban 2.5 MG Tabs tablet Commonly known as: ELIQUIS Take 1 tablet (2.5 mg total) by mouth 2 (two) times daily.   docusate sodium 100 MG capsule Commonly known as: COLACE Take 100 mg by mouth daily as needed for mild constipation.   doxycycline 100 MG tablet Commonly known as: VIBRA-TABS Take 1 tablet (100 mg total) by mouth every 12 (twelve) hours for 3 days.   furosemide 40 MG tablet Commonly known as: LASIX Take 1 tablet (40 mg total) by mouth 2 (two) times daily. What changed: when to take this   insulin glargine 100 UNIT/ML injection Commonly known as: Lantus Inject 0.05 mLs (5 Units total) into the skin daily.   isosorbide-hydrALAZINE 20-37.5 MG tablet Commonly known as: BIDIL Take 1 tablet by mouth 2  (two) times daily.   linagliptin 5 MG Tabs tablet Commonly known as: TRADJENTA Take 1 tablet (5 mg total) by mouth daily.   losartan 100 MG tablet Commonly known as: COZAAR Take 100 mg by mouth daily.   metoprolol succinate 25 MG 24 hr tablet Commonly known as: TOPROL-XL Take 1 tablet (25 mg total) by mouth daily.   OLANZapine 2.5 MG tablet Commonly known as: ZyPREXA Take 1 tablet (2.5 mg total) by mouth at bedtime as needed (insomnia/restlessness/agitation). What changed: reasons to take this   potassium chloride 10 MEQ tablet Commonly known as: KLOR-CON M Take 1 tablet (10 mEq total) by mouth daily.   simvastatin 20 MG tablet Commonly known as: ZOCOR Take 20 mg by mouth at bedtime.        Procedures/Studies: DG CHEST PORT 1 VIEW  Result Date: 09/23/2022 CLINICAL DATA:  Fever EXAM: PORTABLE CHEST 1 VIEW COMPARISON:  09/19/2022 FINDINGS: Transverse diameter of heart is increased. Apparent shift of mediastinum to the left may be due to rotation. Pacemaker battery is seen in left infraclavicular region. Central pulmonary vessels are less prominent. There is calcified nodule in right lower lung fields suggesting granuloma. No new focal infiltrates are seen. Left lateral CP angle is indistinct. There is no pneumothorax. IMPRESSION: Cardiomegaly. There are no signs of pulmonary edema or new focal infiltrates. Blunting of left lateral CP angle may be due to pleural thickening or small effusion. Electronically Signed   By: Elmer Picker M.D.   On: 09/23/2022 15:48   ECHOCARDIOGRAM COMPLETE  Result Date: 09/19/2022    ECHOCARDIOGRAM REPORT   Patient Name:   BOYDEN BRANOM Date of Exam: 09/19/2022 Medical Rec #:  [id number]     Height:       72.0 in Accession #:    [id number]    Weight:       178.6 lb Date of Birth:  09-15-1929      BSA:          2.030 m Patient Age:    29 years      BP:           155/76 mmHg Patient Gender: M             HR:  68 bpm. Exam Location:  [facility] Procedure: 2D Echo, Color Doppler and Cardiac Doppler Indications:    elevated troponin  History:        Patient has prior history of Echocardiogram examinations, most                 recent 01/03/2021. CHF, Pacemaker, chronic kidney disease; Risk                 Factors:Hypertension, Diabetes and Dyslipidemia.  Sonographer:    Johny Chess RDCS Referring Phys: [id number] Royanne Foots [facility]  Sonographer Comments: Shaking. IMPRESSIONS  1. Difficult assessment of LV function, consider echocontrast for better evaluation. Grossly appears mildly decreaesd 40-45%. The majority of the hypokinesis appears to be toward the apex where visualizaiton is limited. . Left ventricular ejection fraction, by estimation, is 40 to 45%. The left ventricle has mildly decreased function. Left ventricular endocardial border not optimally defined to evaluate regional wall motion. There is mild left ventricular hypertrophy. Left ventricular diastolic parameters are indeterminate.  2. Right ventricular systolic function is normal. The right ventricular size is normal. There is moderately elevated pulmonary artery systolic pressure.  3. Left atrial size was mildly dilated.  4. The mitral valve is abnormal. Mild mitral valve regurgitation. No evidence of mitral stenosis.  5. The tricuspid valve is abnormal.  6. The aortic valve is tricuspid. There is mild calcification of the aortic valve. There is mild thickening of the aortic valve. Aortic valve regurgitation is not visualized. No aortic stenosis is present.  7. The inferior vena cava is normal in size with greater than 50% respiratory variability, suggesting right atrial pressure of 3 mmHg. FINDINGS  Left Ventricle: Difficult assessment of LV function, consider echocontrast for better evaluation. Grossly appears mildly decreaesd 40-45%. The majority of the hypokinesis appears to be toward the apex where visualizaiton is limited. Left ventricular ejection fraction, by estimation, is 40 to  45%. The left ventricle has mildly decreased function. Left ventricular endocardial border not optimally defined to evaluate regional wall motion. The left ventricular internal cavity size was normal in size. There is mild left ventricular hypertrophy. Left ventricular diastolic parameters are indeterminate. Right Ventricle: The right ventricular size is normal. Right vetricular wall thickness was not well visualized. Right ventricular systolic function is normal. There is moderately elevated pulmonary artery systolic pressure. The tricuspid regurgitant velocity is 3.75 m/s, and with an assumed right atrial pressure of 3 mmHg, the estimated right ventricular systolic pressure is [id number]. Left Atrium: Left atrial size was mildly dilated. Right Atrium: Right atrial size was normal in size. Pericardium: There is no evidence of pericardial effusion. Mitral Valve: The mitral valve is abnormal. Mild mitral valve regurgitation. No evidence of mitral valve stenosis. Tricuspid Valve: The tricuspid valve is abnormal. Tricuspid valve regurgitation is mild . No evidence of tricuspid stenosis. Aortic Valve: The aortic valve is tricuspid. There is mild calcification of the aortic valve. There is mild thickening of the aortic valve. There is mild aortic valve annular calcification. Aortic valve regurgitation is not visualized. Aortic regurgitation PHT measures 522 msec. No aortic stenosis is present. Aortic valve mean gradient measures 5.0 mmHg. Aortic valve peak gradient measures 10.2 mmHg. Aortic valve area, by VTI measures 1.56 cm. Pulmonic Valve: The pulmonic valve was not well visualized. Pulmonic valve regurgitation is mild. No evidence of pulmonic stenosis. Aorta: The aortic root and ascending aorta are structurally normal, with no evidence of dilitation. Venous: The inferior vena cava is normal in size  with greater than 50% respiratory variability, suggesting right atrial pressure of 3 mmHg. IAS/Shunts: No atrial level  shunt detected by color flow Doppler.  LEFT VENTRICLE PLAX 2D LVIDd:         5.00 cm      Diastology LVIDs:         3.60 cm      LV e' medial:    7.51 cm/s LV PW:         1.10 cm      LV E/e' medial:  13.2 LV IVS:        1.20 cm      LV e' lateral:   12.00 cm/s LVOT diam:     2.20 cm      LV E/e' lateral: 8.3 LV SV:         48 LV SV Index:   24 LVOT Area:     3.80 cm  LV Volumes (MOD) LV vol d, MOD A2C: 113.0 ml LV vol s, MOD A2C: 70.1 ml LV SV MOD A2C:     42.9 ml RIGHT VENTRICLE             IVC RV Basal diam:  3.10 cm     IVC diam: 2.10 cm RV S prime:     10.30 cm/s TAPSE (M-mode): 1.3 cm LEFT ATRIUM             Index        RIGHT ATRIUM           Index LA diam:        4.70 cm 2.31 cm/m   RA Area:     18.40 cm LA Vol (A2C):   98.4 ml 48.46 ml/m  RA Volume:   47.90 ml  23.59 ml/m LA Vol (A4C):   70.8 ml 34.87 ml/m LA Biplane Vol: 83.4 ml 41.08 ml/m  AORTIC VALVE AV Area (Vmax):    1.76 cm AV Area (Vmean):   1.72 cm AV Area (VTI):     1.56 cm AV Vmax:           160.03 cm/s AV Vmean:          102.499 cm/s AV VTI:            0.310 m AV Peak Grad:      10.2 mmHg AV Mean Grad:      5.0 mmHg LVOT Vmax:         74.10 cm/s LVOT Vmean:        46.500 cm/s LVOT VTI:          0.127 m LVOT/AV VTI ratio: 0.41 AI PHT:            522 msec  AORTA Ao Root diam: 3.40 cm Ao Asc diam:  3.40 cm MITRAL VALVE               TRICUSPID VALVE MV Area (PHT): 4.49 cm    TR Peak grad:   56.2 mmHg MV Decel Time: 169 msec    TR Vmax:        375.00 cm/s MV E velocity: 99.20 cm/s MV A velocity: 57.50 cm/s  SHUNTS MV E/A ratio:  1.73        Systemic VTI:  0.13 m                            Systemic Diam: 2.20 cm Carlyle Dolly MD Electronically signed by Carlyle Dolly MD Signature Date/Time:  09/19/2022/3:18:18 PM    Final    CT ABDOMEN PELVIS WO CONTRAST  Result Date: 09/19/2022 CLINICAL DATA:  Fever and abdominal pain. EXAM: CT ABDOMEN AND PELVIS WITHOUT CONTRAST TECHNIQUE: Multidetector CT imaging of the abdomen and pelvis was  performed following the standard protocol without IV contrast. RADIATION DOSE REDUCTION: This exam was performed according to the departmental dose-optimization program which includes automated exposure control, adjustment of the mA and/or kV according to patient size and/or use of iterative reconstruction technique. COMPARISON:  Limited comparison available with renal ultrasound 12/07/2013 FINDINGS: Lower chest: There is mild cardiomegaly and a small anterior pericardial effusion. There is metallic artifact from dual lead pacemaker wires in the right heart. There are bilateral minimal layering pleural effusions. There is a calcified granuloma in the right lower lobe. The lung bases are clear of infiltrates. There is a moderate-sized hiatal hernia. Mild gynecomastia. Hepatobiliary: There is loss of fine detail due to breathing motion. In hepatic segment 7 there is a 6 cm cyst, Hounsfield density 544. In segment 4A, there is a 4.9 cm cyst, Hounsfield density of 10.6. Simple cysts were noted in the right lobe of the liver on the prior ultrasound. They were not measured but appear to be similar size. There are additional smaller scattered cysts in both main lobes and a few scattered additional too small to characterize hypodensities. Near the lower tip of segment 6, there is a 2.2 cm low-density lesion above the usual density of fluid, Hounsfield density of 29. This is on 2:31. If clinically warranted taking into account advanced age, MRI without and with contrast is suggested to assess for solid lesion or complex cystic lesion versus hemangioma. Gallbladder and bile ducts are unremarkable. Pancreas: No abnormality is seen without contrast. Spleen: No abnormality is seen without contrast. Adrenals/Urinary Tract: There is no adrenal mass. Due to breathing motion the kidneys are not optimally evaluated. There are 2 rounded masses exophytic from the lateral midpole cortex of the right kidney, both measuring above the  usual density of fluid, both with Hounsfield density of 34. The more anterior lesion measures 2.7 cm and the more posterior lesion measures 2.4 cm. There is a densely calcified 1.6 cm rounded cortical lesion on the right in the mid to lower pole most likely a calcified cyst. There is a homogeneous cyst in the inferior pole of the right kidney measuring 2.2 cm, Hounsfield density of 8.3, and a homogeneous cyst in the posterior aspect of the left kidney measuring 1.3 cm, Hounsfield density of 4. No urinary stone is seen.  There is no hydronephrosis on the left. There is mild hydroureteronephrosis all the way to the bladder on the right, without visible obstructive abnormality and possibly related to marked distention of the bladder. The bladder dome extends as high as L2. No wall thickening or inflammation is seen. This is possibly due to impression on the bladder base by an enlarged prostate. Stomach/Bowel: No dilatation or wall thickening, including the appendix. There is colonic diverticulosis without focal diverticulitis. Vascular/Lymphatic: Aortic atherosclerosis. No enlarged abdominal or pelvic lymph nodes. Multiple pelvic phleboliths. Reproductive: Enlarged prostate, 5.1 cm transverse with bladder base impression Other: No free air, free hemorrhage, free fluid or incarcerated hernia. Musculoskeletal: Osteopenia and degenerative change lumbar spine. Mild lumbar dextroscoliosis. No acute or other significant osseous findings. IMPRESSION: 1. Motion limited study. 2. Cardiomegaly with small anterior pericardial effusion. Minimal pleural effusions. Moderate-sized hiatal hernia. 3. Hepatic cysts and additional too small to characterize hypodensities. 4. 2.2 cm low-density  lesion in segment 6 of the liver, above the usual density of fluid. If clinically warranted taking into account advanced age, MRI without and with contrast is suggested to assess for solid lesion or complex cystic lesion versus hemangioma. 5.  Bilateral renal cysts. 6. 2 rounded masses in the right kidney measuring above the usual density of fluid, measuring 2.7 cm and 2.4 cm. These could be complex cysts or solid masses. MRI without and with contrast recommended, if clinically warranted taking into account age. 7. Mild right hydroureteronephrosis all the way to the bladder, without visible obstructive abnormality. This is possibly related to marked distention of the bladder. 8. Prostatomegaly with bladder base impression. 9. Diverticulosis without evidence of diverticulitis. 10. Aortic atherosclerosis. 11. Osteopenia and degenerative change. Aortic Atherosclerosis (ICD10-I70.0). Electronically Signed   By: Telford Nab M.D.   On: 09/19/2022 01:31   CT HEAD WO CONTRAST (5MM)  Result Date: 09/19/2022 CLINICAL DATA:  Delirium EXAM: CT HEAD WITHOUT CONTRAST TECHNIQUE: Contiguous axial images were obtained from the base of the skull through the vertex without intravenous contrast. RADIATION DOSE REDUCTION: This exam was performed according to the departmental dose-optimization program which includes automated exposure control, adjustment of the mA and/or kV according to patient size and/or use of iterative reconstruction technique. COMPARISON:  CT head 01/02/2021 FINDINGS: Brain: Cerebral ventricle sizes are concordant with the degree of cerebral volume loss. Patchy and confluent areas of decreased attenuation are noted throughout the deep and periventricular white matter of the cerebral hemispheres bilaterally, compatible with chronic microvascular ischemic disease. no evidence of large-territorial acute infarction. No parenchymal hemorrhage. No mass lesion. No extra-axial collection. No mass effect or midline shift. No hydrocephalus. Basilar cisterns are patent. Vascular: No hyperdense vessel. Atherosclerotic calcifications are present within the cavernous internal carotid arteries. Skull: No acute fracture or focal lesion. Sinuses/Orbits: Paranasal  sinuses and mastoid air cells are clear. The orbits are unremarkable. Other: None. IMPRESSION: No acute intracranial abnormality. Electronically Signed   By: Iven Finn M.D.   On: 09/19/2022 01:10   DG Chest Port 1 View  Result Date: 09/19/2022 CLINICAL DATA:  Questionable sepsis.  Evaluate for abnormality. EXAM: PORTABLE CHEST 1 VIEW COMPARISON:  01/03/2021 FINDINGS: Stable cardiomegaly. Aortic atherosclerotic calcification. Left chest wall pacemaker. Patchy bilateral airspace and interstitial opacities suggestive of edema. Pulmonary vascular congestion no definite pleural effusion. No pneumothorax. No displaced rib fractures. Calcified granuloma right lower lung IMPRESSION: Patchy bilateral airspace and interstitial opacities suggestive of edema. Electronically Signed   By: Placido Sou M.D.   On: 09/19/2022 00:58     Discharge Exam: Vitals:   09/23/22 2338 09/24/22 0403  BP: (!) 108/47 (!) 134/54  Pulse: 60 66  Resp: 16 16  Temp: 98.4 F (36.9 C) 98.3 F (36.8 C)  SpO2: 99% 100%   Vitals:   09/23/22 2011 09/23/22 2338 09/24/22 0403 09/24/22 0500  BP: (!) 107/51 (!) 108/47 (!) 134/54   Pulse: 60 60 66   Resp: 20 16 16    Temp: (!) 97.5 F (36.4 C) 98.4 F (36.9 C) 98.3 F (36.8 C)   TempSrc: Oral Oral Oral   SpO2: 99% 99% 100%   Weight:    83 kg  Height:       General: Pt is alert, elderly frail, awake, not in acute distress Cardiovascular: normal S1/S2 +, no rubs, no gallops Respiratory: CTA bilaterally, no wheezing, no rhonchi Abdominal: Soft, NT, ND, bowel sounds + Extremities:  no cyanosis   The results of significant diagnostics from  this hospitalization (including imaging, microbiology, ancillary and laboratory) are listed below for reference.     Microbiology: Recent Results (from the past 240 hour(s))  Blood Culture (routine x 2)     Status: None   Collection Time: 09/19/22 12:43 AM   Specimen: Right Antecubital; Blood  Result Value Ref Range Status    Specimen Description   Final    RIGHT ANTECUBITAL BOTTLES DRAWN AEROBIC AND ANAEROBIC   Special Requests Blood Culture adequate volume  Final   Culture   Final    NO GROWTH 5 DAYS Performed at [facility], [address], [place], [place] [address]    Report Status 09/24/2022 FINAL  Final  Resp panel by RT-PCR (RSV, Flu A&B, Covid) Anterior Nasal Swab     Status: None   Collection Time: 09/19/22  1:00 AM   Specimen: Anterior Nasal Swab  Result Value Ref Range Status   SARS Coronavirus 2 by RT PCR NEGATIVE NEGATIVE Final    Comment: (NOTE) SARS-CoV-2 target nucleic acids are NOT DETECTED.  The SARS-CoV-2 RNA is generally detectable in upper respiratory specimens during the acute phase of infection. The lowest concentration of SARS-CoV-2 viral copies this assay can detect is 138 copies/mL. A negative result does not preclude SARS-Cov-2 infection and should not be used as the sole basis for treatment or other patient management decisions. A negative result may occur with  improper specimen collection/handling, submission of specimen other than nasopharyngeal swab, presence of viral mutation(s) within the areas targeted by this assay, and inadequate number of viral copies(<138 copies/mL). A negative result must be combined with clinical observations, patient history, and epidemiological information. The expected result is Negative.  Fact Sheet for Patients:  EntrepreneurPulse.com.au  Fact Sheet for Healthcare Providers:  IncredibleEmployment.be  This test is no t yet approved or cleared by the [place] FDA and  has been authorized for detection and/or diagnosis of SARS-CoV-2 by FDA under an Emergency Use Authorization (EUA). This EUA will remain  in effect (meaning this test can be used) for the duration of the COVID-19 declaration under Section 564(b)(1) of the Act, 21 U.S.C.section 360bbb-3(b)(1), unless the authorization is terminated   or revoked sooner.       Influenza A by PCR NEGATIVE NEGATIVE Final   Influenza B by PCR NEGATIVE NEGATIVE Final    Comment: (NOTE) The Xpert Xpress SARS-CoV-2/FLU/RSV plus assay is intended as an aid in the diagnosis of influenza from Nasopharyngeal swab specimens and should not be used as a sole basis for treatment. Nasal washings and aspirates are unacceptable for Xpert Xpress SARS-CoV-2/FLU/RSV testing.  Fact Sheet for Patients: EntrepreneurPulse.com.au  Fact Sheet for Healthcare Providers: IncredibleEmployment.be  This test is not yet approved or cleared by the [place] FDA and has been authorized for detection and/or diagnosis of SARS-CoV-2 by FDA under an Emergency Use Authorization (EUA). This EUA will remain in effect (meaning this test can be used) for the duration of the COVID-19 declaration under Section 564(b)(1) of the Act, 21 U.S.C. section 360bbb-3(b)(1), unless the authorization is terminated or revoked.     Resp Syncytial Virus by PCR NEGATIVE NEGATIVE Final    Comment: (NOTE) Fact Sheet for Patients: EntrepreneurPulse.com.au  Fact Sheet for Healthcare Providers: IncredibleEmployment.be  This test is not yet approved or cleared by the [place] FDA and has been authorized for detection and/or diagnosis of SARS-CoV-2 by FDA under an Emergency Use Authorization (EUA). This EUA will remain in effect (meaning this test can be used) for the  duration of the COVID-19 declaration under Section 564(b)(1) of the Act, 21 U.S.C. section 360bbb-3(b)(1), unless the authorization is terminated or revoked.  Performed at [facility], [address] [place], [place] [address]   Blood Culture (routine x 2)     Status: None   Collection Time: 09/19/22  3:44 AM   Specimen: Right Antecubital; Blood  Result Value Ref Range Status   Specimen Description   Final    RIGHT ANTECUBITAL BOTTLES  DRAWN AEROBIC AND ANAEROBIC   Special Requests Blood Culture adequate volume  Final   Culture   Final    NO GROWTH 5 DAYS Performed at [facility], [address] [place], [place] [address]    Report Status 09/24/2022 FINAL  Final  MRSA Next Gen by PCR, Nasal     Status: None   Collection Time: 09/19/22  3:36 PM   Specimen: Nasal Mucosa; Nasal Swab  Result Value Ref Range Status   MRSA by PCR Next Gen NOT DETECTED NOT DETECTED Final    Comment: (NOTE) The GeneXpert MRSA Assay (FDA approved for NASAL specimens only), is one component of a comprehensive MRSA colonization surveillance program. It is not intended to diagnose MRSA infection nor to guide or monitor treatment for MRSA infections. Test performance is not FDA approved in patients less than 79 years old. Performed at [facility], [address], [place], [place] [address]   Culture, blood (Routine X 2) w Reflex to ID Panel     Status: None (Preliminary result)   Collection Time: 09/23/22  3:31 PM   Specimen: BLOOD  Result Value Ref Range Status   Specimen Description BLOOD BLOOD LEFT HAND  Final   Special Requests   Final    BOTTLES DRAWN AEROBIC AND ANAEROBIC Blood Culture adequate volume   Culture   Final    NO GROWTH < 24 HOURS Performed at [facility], [address], [place], [place] [address]    Report Status PENDING  Incomplete  Culture, blood (Routine X 2) w Reflex to ID Panel     Status: None (Preliminary result)   Collection Time: 09/23/22  3:41 PM   Specimen: BLOOD  Result Value Ref Range Status   Specimen Description BLOOD BLOOD RIGHT HAND  Final   Special Requests   Final    BOTTLES DRAWN AEROBIC AND ANAEROBIC Blood Culture adequate volume   Culture   Final    NO GROWTH < 24 HOURS Performed at [facility], [address] [place], [place] [address]    Report Status PENDING  Incomplete  Resp panel by RT-PCR (RSV, Flu A&B, Covid) Anterior Nasal Swab     Status: None   Collection Time: 09/23/22  7:00 PM    Specimen: Anterior Nasal Swab  Result Value Ref Range Status   SARS Coronavirus 2 by RT PCR NEGATIVE NEGATIVE Final    Comment: (NOTE) SARS-CoV-2 target nucleic acids are NOT DETECTED.  The SARS-CoV-2 RNA is generally detectable in upper respiratory specimens during the acute phase of infection. The lowest concentration of SARS-CoV-2 viral copies this assay can detect is 138 copies/mL. A negative result does not preclude SARS-Cov-2 infection and should not be used as the sole basis for treatment or other patient management decisions. A negative result may occur with  improper specimen collection/handling, submission of specimen other than nasopharyngeal swab, presence of viral mutation(s) within the areas targeted by this assay, and inadequate number of viral copies(<138 copies/mL). A negative result must be combined with clinical observations,  patient history, and epidemiological information. The expected result is Negative.  Fact Sheet for Patients:  EntrepreneurPulse.com.au  Fact Sheet for Healthcare Providers:  IncredibleEmployment.be  This test is no t yet approved or cleared by the [place] FDA and  has been authorized for detection and/or diagnosis of SARS-CoV-2 by FDA under an Emergency Use Authorization (EUA). This EUA will remain  in effect (meaning this test can be used) for the duration of the COVID-19 declaration under Section 564(b)(1) of the Act, 21 U.S.C.section 360bbb-3(b)(1), unless the authorization is terminated  or revoked sooner.       Influenza A by PCR NEGATIVE NEGATIVE Final   Influenza B by PCR NEGATIVE NEGATIVE Final    Comment: (NOTE) The Xpert Xpress SARS-CoV-2/FLU/RSV plus assay is intended as an aid in the diagnosis of influenza from Nasopharyngeal swab specimens and should not be used as a sole basis for treatment. Nasal washings and aspirates are unacceptable for Xpert Xpress  SARS-CoV-2/FLU/RSV testing.  Fact Sheet for Patients: EntrepreneurPulse.com.au  Fact Sheet for Healthcare Providers: IncredibleEmployment.be  This test is not yet approved or cleared by the [place] FDA and has been authorized for detection and/or diagnosis of SARS-CoV-2 by FDA under an Emergency Use Authorization (EUA). This EUA will remain in effect (meaning this test can be used) for the duration of the COVID-19 declaration under Section 564(b)(1) of the Act, 21 U.S.C. section 360bbb-3(b)(1), unless the authorization is terminated or revoked.     Resp Syncytial Virus by PCR NEGATIVE NEGATIVE Final    Comment: (NOTE) Fact Sheet for Patients: EntrepreneurPulse.com.au  Fact Sheet for Healthcare Providers: IncredibleEmployment.be  This test is not yet approved or cleared by the [place] FDA and has been authorized for detection and/or diagnosis of SARS-CoV-2 by FDA under an Emergency Use Authorization (EUA). This EUA will remain in effect (meaning this test can be used) for the duration of the COVID-19 declaration under Section 564(b)(1) of the Act, 21 U.S.C. section 360bbb-3(b)(1), unless the authorization is terminated or revoked.  Performed at [facility], [address] [place], [place] [address]      Labs: BNP (last 3 results) No results for input(s): "BNP" in the last 8760 hours. Basic Metabolic Panel: Recent Labs  Lab 09/19/22 0043 09/20/22 0320 09/21/22 0456 09/24/22 0439  NA 138 138 138 139  K 3.9 3.8 3.7 4.2  CL 107 106 103 103  CO2 19* 20* 23 23  GLUCOSE 178* 162* 209* 217*  BUN 40* 40* 50* 55*  CREATININE 2.13* 1.96* 2.08* 1.98*  CALCIUM 9.2 8.9 9.0 9.2  MG  --  1.8 2.0  --    Liver Function Tests: Recent Labs  Lab 09/19/22 0043  AST 25  ALT 14  ALKPHOS 45  BILITOT 1.2  PROT 7.4  ALBUMIN 3.4*   Recent Labs  Lab 09/19/22 0043  LIPASE 27   No results for  input(s): "AMMONIA" in the last 168 hours. CBC: Recent Labs  Lab 09/19/22 0043 09/20/22 0320 09/23/22 1531 09/24/22 0439  WBC 8.0 6.9 6.9 6.9  NEUTROABS 5.7  --   --  5.1  HGB 9.7* 9.3* 9.0* 8.5*  HCT 31.3* 30.1* 28.9* 28.0*  MCV 81.7 81.4 81.6 81.9  PLT 185 167 214 207   Cardiac Enzymes: No results for input(s): "CKTOTAL", "CKMB", "CKMBINDEX", "TROPONINI" in the last 168 hours. BNP: Invalid input(s): "POCBNP" CBG: Recent Labs  Lab 09/23/22 0740 09/23/22 1154 09/23/22 1651 09/23/22 2044 09/24/22 0724  GLUCAP 183* 224* 228* 201* 209*  D-Dimer No results for input(s): "DDIMER" in the last 72 hours. Hgb A1c No results for input(s): "HGBA1C" in the last 72 hours. Lipid Profile No results for input(s): "CHOL", "HDL", "LDLCALC", "TRIG", "CHOLHDL", "LDLDIRECT" in the last 72 hours. Thyroid function studies No results for input(s): "TSH", "T4TOTAL", "T3FREE", "THYROIDAB" in the last 72 hours.  Invalid input(s): "FREET3" Anemia work up No results for input(s): "VITAMINB12", "FOLATE", "FERRITIN", "TIBC", "IRON", "RETICCTPCT" in the last 72 hours. Urinalysis    Component Value Date/Time   COLORURINE YELLOW 09/23/2022 1630   APPEARANCEUR CLEAR 09/23/2022 1630   LABSPEC 1.020 09/23/2022 1630   PHURINE 5.5 09/23/2022 1630   GLUCOSEU NEGATIVE 09/23/2022 1630   HGBUR LARGE (A) 09/23/2022 1630   BILIRUBINUR NEGATIVE 09/23/2022 1630   KETONESUR NEGATIVE 09/23/2022 1630   PROTEINUR TRACE (A) 09/23/2022 1630   UROBILINOGEN 0.2 01/12/2015 0845   NITRITE NEGATIVE 09/23/2022 1630   LEUKOCYTESUR SMALL (A) 09/23/2022 1630   Sepsis Labs Recent Labs  Lab 09/19/22 0043 09/20/22 0320 09/23/22 1531 09/24/22 0439  WBC 8.0 6.9 6.9 6.9   Microbiology Recent Results (from the past 240 hour(s))  Blood Culture (routine x 2)     Status: None   Collection Time: 09/19/22 12:43 AM   Specimen: Right Antecubital; Blood  Result Value Ref Range Status   Specimen Description   Final     RIGHT ANTECUBITAL BOTTLES DRAWN AEROBIC AND ANAEROBIC   Special Requests Blood Culture adequate volume  Final   Culture   Final    NO GROWTH 5 DAYS Performed at [facility], [address] [place], [place] [address]    Report Status 09/24/2022 FINAL  Final  Resp panel by RT-PCR (RSV, Flu A&B, Covid) Anterior Nasal Swab     Status: None   Collection Time: 09/19/22  1:00 AM   Specimen: Anterior Nasal Swab  Result Value Ref Range Status   SARS Coronavirus 2 by RT PCR NEGATIVE NEGATIVE Final    Comment: (NOTE) SARS-CoV-2 target nucleic acids are NOT DETECTED.  The SARS-CoV-2 RNA is generally detectable in upper respiratory specimens during the acute phase of infection. The lowest concentration of SARS-CoV-2 viral copies this assay can detect is 138 copies/mL. A negative result does not preclude SARS-Cov-2 infection and should not be used as the sole basis for treatment or other patient management decisions. A negative result may occur with  improper specimen collection/handling, submission of specimen other than nasopharyngeal swab, presence of viral mutation(s) within the areas targeted by this assay, and inadequate number of viral copies(<138 copies/mL). A negative result must be combined with clinical observations, patient history, and epidemiological information. The expected result is Negative.  Fact Sheet for Patients:  EntrepreneurPulse.com.au  Fact Sheet for Healthcare Providers:  IncredibleEmployment.be  This test is no t yet approved or cleared by the [place] FDA and  has been authorized for detection and/or diagnosis of SARS-CoV-2 by FDA under an Emergency Use Authorization (EUA). This EUA will remain  in effect (meaning this test can be used) for the duration of the COVID-19 declaration under Section 564(b)(1) of the Act, 21 U.S.C.section 360bbb-3(b)(1), unless the authorization is terminated  or revoked sooner.        Influenza A by PCR NEGATIVE NEGATIVE Final   Influenza B by PCR NEGATIVE NEGATIVE Final    Comment: (NOTE) The Xpert Xpress SARS-CoV-2/FLU/RSV plus assay is intended as an aid in the diagnosis of influenza from Nasopharyngeal swab specimens and should not be used as a sole basis for treatment.  Nasal washings and aspirates are unacceptable for Xpert Xpress SARS-CoV-2/FLU/RSV testing.  Fact Sheet for Patients: EntrepreneurPulse.com.au  Fact Sheet for Healthcare Providers: IncredibleEmployment.be  This test is not yet approved or cleared by the [place] FDA and has been authorized for detection and/or diagnosis of SARS-CoV-2 by FDA under an Emergency Use Authorization (EUA). This EUA will remain in effect (meaning this test can be used) for the duration of the COVID-19 declaration under Section 564(b)(1) of the Act, 21 U.S.C. section 360bbb-3(b)(1), unless the authorization is terminated or revoked.     Resp Syncytial Virus by PCR NEGATIVE NEGATIVE Final    Comment: (NOTE) Fact Sheet for Patients: EntrepreneurPulse.com.au  Fact Sheet for Healthcare Providers: IncredibleEmployment.be  This test is not yet approved or cleared by the [place] FDA and has been authorized for detection and/or diagnosis of SARS-CoV-2 by FDA under an Emergency Use Authorization (EUA). This EUA will remain in effect (meaning this test can be used) for the duration of the COVID-19 declaration under Section 564(b)(1) of the Act, 21 U.S.C. section 360bbb-3(b)(1), unless the authorization is terminated or revoked.  Performed at [facility], [address] [place], [place] [address]   Blood Culture (routine x 2)     Status: None   Collection Time: 09/19/22  3:44 AM   Specimen: Right Antecubital; Blood  Result Value Ref Range Status   Specimen Description   Final    RIGHT ANTECUBITAL BOTTLES DRAWN AEROBIC AND ANAEROBIC    Special Requests Blood Culture adequate volume  Final   Culture   Final    NO GROWTH 5 DAYS Performed at [facility], [address], [place], [place] [address]    Report Status 09/24/2022 FINAL  Final  MRSA Next Gen by PCR, Nasal     Status: None   Collection Time: 09/19/22  3:36 PM   Specimen: Nasal Mucosa; Nasal Swab  Result Value Ref Range Status   MRSA by PCR Next Gen NOT DETECTED NOT DETECTED Final    Comment: (NOTE) The GeneXpert MRSA Assay (FDA approved for NASAL specimens only), is one component of a comprehensive MRSA colonization surveillance program. It is not intended to diagnose MRSA infection nor to guide or monitor treatment for MRSA infections. Test performance is not FDA approved in patients less than 36 years old. Performed at [facility], [address] [place], [place] [address]   Culture, blood (Routine X 2) w Reflex to ID Panel     Status: None (Preliminary result)   Collection Time: 09/23/22  3:31 PM   Specimen: BLOOD  Result Value Ref Range Status   Specimen Description BLOOD BLOOD LEFT HAND  Final   Special Requests   Final    BOTTLES DRAWN AEROBIC AND ANAEROBIC Blood Culture adequate volume   Culture   Final    NO GROWTH < 24 HOURS Performed at [facility], [address] [place], [place] [address]    Report Status PENDING  Incomplete  Culture, blood (Routine X 2) w Reflex to ID Panel     Status: None (Preliminary result)   Collection Time: 09/23/22  3:41 PM   Specimen: BLOOD  Result Value Ref Range Status   Specimen Description BLOOD BLOOD RIGHT HAND  Final   Special Requests   Final    BOTTLES DRAWN AEROBIC AND ANAEROBIC Blood Culture adequate volume   Culture   Final    NO GROWTH < 24 HOURS Performed at [facility], [address] [place], [place] [address]  Report Status PENDING  Incomplete  Resp panel by RT-PCR (RSV, Flu A&B, Covid) Anterior Nasal Swab     Status: None   Collection Time: 09/23/22  7:00 PM   Specimen: Anterior Nasal  Swab  Result Value Ref Range Status   SARS Coronavirus 2 by RT PCR NEGATIVE NEGATIVE Final    Comment: (NOTE) SARS-CoV-2 target nucleic acids are NOT DETECTED.  The SARS-CoV-2 RNA is generally detectable in upper respiratory specimens during the acute phase of infection. The lowest concentration of SARS-CoV-2 viral copies this assay can detect is 138 copies/mL. A negative result does not preclude SARS-Cov-2 infection and should not be used as the sole basis for treatment or other patient management decisions. A negative result may occur with  improper specimen collection/handling, submission of specimen other than nasopharyngeal swab, presence of viral mutation(s) within the areas targeted by this assay, and inadequate number of viral copies(<138 copies/mL). A negative result must be combined with clinical observations, patient history, and epidemiological information. The expected result is Negative.  Fact Sheet for Patients:  EntrepreneurPulse.com.au  Fact Sheet for Healthcare Providers:  IncredibleEmployment.be  This test is no t yet approved or cleared by the [place] FDA and  has been authorized for detection and/or diagnosis of SARS-CoV-2 by FDA under an Emergency Use Authorization (EUA). This EUA will remain  in effect (meaning this test can be used) for the duration of the COVID-19 declaration under Section 564(b)(1) of the Act, 21 U.S.C.section 360bbb-3(b)(1), unless the authorization is terminated  or revoked sooner.       Influenza A by PCR NEGATIVE NEGATIVE Final   Influenza B by PCR NEGATIVE NEGATIVE Final    Comment: (NOTE) The Xpert Xpress SARS-CoV-2/FLU/RSV plus assay is intended as an aid in the diagnosis of influenza from Nasopharyngeal swab specimens and should not be used as a sole basis for treatment. Nasal washings and aspirates are unacceptable for Xpert Xpress SARS-CoV-2/FLU/RSV testing.  Fact Sheet for  Patients: EntrepreneurPulse.com.au  Fact Sheet for Healthcare Providers: IncredibleEmployment.be  This test is not yet approved or cleared by the [place] FDA and has been authorized for detection and/or diagnosis of SARS-CoV-2 by FDA under an Emergency Use Authorization (EUA). This EUA will remain in effect (meaning this test can be used) for the duration of the COVID-19 declaration under Section 564(b)(1) of the Act, 21 U.S.C. section 360bbb-3(b)(1), unless the authorization is terminated or revoked.     Resp Syncytial Virus by PCR NEGATIVE NEGATIVE Final    Comment: (NOTE) Fact Sheet for Patients: EntrepreneurPulse.com.au  Fact Sheet for Healthcare Providers: IncredibleEmployment.be  This test is not yet approved or cleared by the [place] FDA and has been authorized for detection and/or diagnosis of SARS-CoV-2 by FDA under an Emergency Use Authorization (EUA). This EUA will remain in effect (meaning this test can be used) for the duration of the COVID-19 declaration under Section 564(b)(1) of the Act, 21 U.S.C. section 360bbb-3(b)(1), unless the authorization is terminated or revoked.  Performed at [facility] [address] [place], [place] [address]    Time coordinating discharge: 41 mins   SIGNED:  Irwin Brakeman, MD  [facility] 09/24/2022, 9:27 AM How to contact the [facility] Attending or Consulting provider [place] or covering provider during after hours [place], for this patient?  Check the care team in [facility] and look for a) attending/consulting [facility] provider listed and b) the [facility] team listed Log into [email or web address] and use [place]'s universal password to access. If  you do not have the password, please contact the hospital operator. Locate the [facility] provider you are looking for under [facility] and page to a number that you can be directly reached. If you still have  difficulty reaching the provider, please page the [facility] (Director on Call) for the Hospitalists listed on amion for assistance.

## 2022-09-23 NOTE — TOC Progression Note (Signed)
Transition of Care [facility]) - Progression Note    Patient Details  Name: Kyle Schultz MRN: [id number] Date of Birth: 07/17/1929  Transition of Care [facility]) CM/SW Contact  Boneta Lucks, RN Phone Number: 09/23/2022, 3:08 PM  Clinical Narrative:   Discharge vitals patient has low grade temp. MD cancelling discharge. CM cancelled EMS, Updated Ebony Hail and family.   Expected Discharge Plan: Skilled Nursing Facility Barriers to Discharge: Barriers Resolved  Expected Discharge Plan and [place] arrangements for the past 2 months: Single Family Home Expected Discharge Date: 09/23/22                                     Social Determinants of Health (SDOH) Interventions SDOH Screenings   Food Insecurity: No Food Insecurity (09/23/2022)  Housing: [place]  (09/23/2022)  Transportation Needs: No Transportation Needs (09/23/2022)  Utilities: Not At Risk (09/23/2022)  Tobacco Use: Low Risk  (09/23/2022)    Readmission Risk Interventions    09/21/2022    2:59 PM  Readmission Risk Prevention Plan  Transportation Screening Complete  PCP or Specialist Appt within 5-7 Days Not Complete  Home Care Screening Complete  Medication Review (RN CM) Complete

## 2022-09-23 NOTE — Discharge Instructions (Signed)
IMPORTANT INFORMATION: PAY CLOSE ATTENTION  ? ?PHYSICIAN DISCHARGE INSTRUCTIONS ? ?Follow with Primary care provider  Fanta, Tesfaye Demissie, MD  and other consultants as instructed by your Hospitalist Physician ? ?SEEK MEDICAL CARE OR RETURN TO EMERGENCY ROOM IF SYMPTOMS COME BACK, WORSEN OR NEW PROBLEM DEVELOPS  ? ?Please note: ?You were cared for by a hospitalist during your hospital stay. Every effort will be made to forward records to your primary care provider.  You can request that your primary care provider send for your hospital records if they have not received them.  Once you are discharged, your primary care physician will handle any further medical issues. Please note that NO REFILLS for any discharge medications will be authorized once you are discharged, as it is imperative that you return to your primary care physician (or establish a relationship with a primary care physician if you do not have one) for your post hospital discharge needs so that they can reassess your need for medications and monitor your lab values. ? ?Please get a complete blood count and chemistry panel checked by your Primary MD at your next visit, and again as instructed by your Primary MD. ? ?Get Medicines reviewed and adjusted: ?Please take all your medications with you for your next visit with your Primary MD ? ?Laboratory/radiological data: ?Please request your Primary MD to go over all hospital tests and procedure/radiological results at the follow up, please ask your primary care provider to get all Hospital records sent to his/her office. ? ?In some cases, they will be blood work, cultures and biopsy results pending at the time of your discharge. Please request that your primary care provider follow up on these results. ? ?If you are diabetic, please bring your blood sugar readings with you to your follow up appointment with primary care.   ? ?Please call and make your follow up appointments as soon as possible.    ? ?Also Note the following: ?If you experience worsening of your admission symptoms, develop shortness of breath, life threatening emergency, suicidal or homicidal thoughts you must seek medical attention immediately by calling 911 or calling your MD immediately  if symptoms less severe. ? ?You must read complete instructions/literature along with all the possible adverse reactions/side effects for all the Medicines you take and that have been prescribed to you. Take any new Medicines after you have completely understood and accpet all the possible adverse reactions/side effects.  ? ?Do not drive when taking Pain medications or sleeping medications (Benzodiazepines) ? ?Do not take more than prescribed Pain, Sleep and Anxiety Medications. It is not advisable to combine anxiety,sleep and pain medications without talking with your primary care practitioner ? ?Special Instructions: If you have smoked or chewed Tobacco  in the last 2 yrs please stop smoking, stop any regular Alcohol  and or any Recreational drug use. ? ?Wear Seat belts while driving.  Do not drive if taking any narcotic, mind altering or controlled substances or recreational drugs or alcohol.  ? ? ? ? ? ?

## 2022-09-23 NOTE — Progress Notes (Signed)
Report given to [facility]. Foley clamped for clean specimen and [facility] aware. Sister of pt called Vicente Masson and advised pt will not be transported to [facility] today due to fever.

## 2022-09-23 NOTE — Progress Notes (Addendum)
Pt temp 101.6 rectal. Dr Wynetta Emery aware and orders to hold d/c. Pt has no other changes

## 2022-09-24 LAB — CBC WITH DIFFERENTIAL/PLATELET
Abs Immature Granulocytes: 0.04 10*3/uL (ref 0.00–0.07)
Basophils Absolute: 0 10*3/uL (ref 0.0–0.1)
Basophils Relative: 0 %
Eosinophils Absolute: 0.1 10*3/uL (ref 0.0–0.5)
Eosinophils Relative: 1 %
HCT: 28 % — ABNORMAL LOW (ref 39.0–52.0)
Hemoglobin: 8.5 g/dL — ABNORMAL LOW (ref 13.0–17.0)
Immature Granulocytes: 1 %
Lymphocytes Relative: 15 %
Lymphs Abs: 1.1 10*3/uL (ref 0.7–4.0)
MCH: 24.9 pg — ABNORMAL LOW (ref 26.0–34.0)
MCHC: 30.4 g/dL (ref 30.0–36.0)
MCV: 81.9 fL (ref 80.0–100.0)
Monocytes Absolute: 0.7 10*3/uL (ref 0.1–1.0)
Monocytes Relative: 10 %
Neutro Abs: 5.1 10*3/uL (ref 1.7–7.7)
Neutrophils Relative %: 73 %
Platelets: 207 10*3/uL (ref 150–400)
RBC: 3.42 MIL/uL — ABNORMAL LOW (ref 4.22–5.81)
RDW: 15.8 % — ABNORMAL HIGH (ref 11.5–15.5)
WBC: 6.9 10*3/uL (ref 4.0–10.5)
nRBC: 0 % (ref 0.0–0.2)

## 2022-09-24 LAB — CULTURE, BLOOD (ROUTINE X 2)
Culture: NO GROWTH
Culture: NO GROWTH
Special Requests: ADEQUATE
Special Requests: ADEQUATE

## 2022-09-24 LAB — GLUCOSE, CAPILLARY
Glucose-Capillary: 209 mg/dL — ABNORMAL HIGH (ref 70–99)
Glucose-Capillary: 189 mg/dL — ABNORMAL HIGH (ref 70–99)

## 2022-09-24 LAB — BASIC METABOLIC PANEL
Anion gap: 13 (ref 5–15)
BUN: 55 mg/dL — ABNORMAL HIGH (ref 8–23)
CO2: 23 mmol/L (ref 22–32)
Calcium: 9.2 mg/dL (ref 8.9–10.3)
Chloride: 103 mmol/L (ref 98–111)
Creatinine, Ser: 1.98 mg/dL — ABNORMAL HIGH (ref 0.61–1.24)
GFR, Estimated: 31 mL/min — ABNORMAL LOW (ref >60)
Glucose, Bld: 217 mg/dL — ABNORMAL HIGH (ref 70–99)
Potassium: 4.2 mmol/L (ref 3.5–5.1)
Sodium: 139 mmol/L (ref 135–145)

## 2022-09-24 LAB — PROCALCITONIN: Procalcitonin: 1.14 ng/mL

## 2022-09-24 MED ORDER — ACETAMINOPHEN 325 MG PO TABS: 650.0000 mg | ORAL_TABLET | Freq: Three times a day (TID) | ORAL | Status: DC

## 2022-09-24 MED ORDER — DOXYCYCLINE HYCLATE 100 MG PO TABS: 100.0000 mg | ORAL_TABLET | Freq: Two times a day (BID) | ORAL | 0 refills | Status: AC

## 2022-09-24 NOTE — Inpatient Diabetes Management (Signed)
Inpatient Diabetes Program Recommendations  AACE/ADA: New Consensus Statement on Inpatient Glycemic Control   Target Ranges:  Prepandial:   less than 140 mg/dL      Peak postprandial:   less than 180 mg/dL (1-2 hours)      Critically ill patients:  140 - 180 mg/dL    Latest Reference Range & Units 09/23/22 07:40 09/23/22 11:54 09/23/22 16:51 09/23/22 20:44 09/24/22 07:24  Glucose-Capillary 70 - 99 mg/dL 183 (H) 224 (H) 228 (H) 201 (H) 209 (H)   Review of Glycemic Control  Diabetes history: DM2 Outpatient Diabetes medications: Amaryl 2 mg QAM, Glucotrol 5 mg BID, Tradjenta 5 mg daily (not taking), Insulin as needed for CBG >200 Current orders for Inpatient glycemic control: Novolog 0-15 units TID with meals, Novolog 0-5 units QHS  Inpatient Diabetes Program Recommendations:    Insulin: May want to consider ordering Semglee 5 units Q24H.  Outpatient DM medications: Noted patient has Amaryl and Glucotrol on home medication list with both noted to be last given on 09/19/22 and Tradjenta with notation patient is not taking. Appears patient is getting Amaryl from [facility] and Glucotrol from [facility]; also has Rx for Tradjenta 5 mg daily at [facility] that has never been filled. Please address DM medications at discharge.  Thanks, Barnie Alderman, RN, MSN, [facility] Coordinator Inpatient Diabetes Program [phone] (Team Pager from 8am to [place])

## 2022-09-24 NOTE — Care Management Important Message (Signed)
Important Message  Patient Details  Name: Kyle Schultz MRN: [id number] Date of Birth: [birth year]   Medicare Important Message Given:  Yes     Tommy Medal 09/24/2022, 11:46 AM

## 2022-09-24 NOTE — Progress Notes (Signed)
[facility] room 211 bed 2. report  called to lynn report [phone]. EMS will transport. Foley will stay for 1 week per attending . Sent attending msg 5 mg oxy for pain as prn

## 2022-09-24 NOTE — TOC Transition Note (Signed)
Transition of Care [facility]) - CM/SW Discharge Note   Patient Details  Name: Kyle Schultz MRN: [id number] Date of Birth: 11-19-1929  [facility]) CM/SW Contact:  Shade Flood, LCSW Phone Number: 09/24/2022, 11:55 AM   Clinical Narrative:     Pt stable for dc to [facility] per MD. Updated Bryson Ha at [facility] and they can admit pt today. Updated pt's son and sister and they remain in agreement with the dc plan.  DC clinical sent electronically. RN called report. EMS arranged.  No other TOC needs for dc.  Final next level of care: Skilled Nursing Facility Barriers to Discharge: Barriers Resolved   Patient Goals and CMS Choice CMS Medicare.gov Compare Post Acute Care list provided to:: Patient Represenative (must comment) Choice offered to / list presented to : Sibling  Discharge Placement                Patient chooses bed at: [facility] Patient to be transferred to facility by: EMS Name of family member notified: Robina Ade Patient and family notified of of transfer: 09/24/22  Discharge Plan and Services Additional resources added to the After Visit Summary for                                       Social Determinants of Health (SDOH) Interventions SDOH Screenings   Food Insecurity: No Food Insecurity (09/23/2022)  Housing: [place]  (09/23/2022)  Transportation Needs: No Transportation Needs (09/23/2022)  Utilities: Not At Risk (09/23/2022)  Tobacco Use: Low Risk  (09/23/2022)     Readmission Risk Interventions    09/21/2022    2:59 PM  Readmission Risk Prevention Plan  Transportation Screening Complete  PCP or Specialist Appt within 5-7 Days Not Complete  Home Care Screening Complete  Medication Review (RN CM) Complete

## 2022-09-24 NOTE — Progress Notes (Signed)
09/24/2022 9:23 AM  DC from 3/14 was held due to fever. We did a sepsis work up which was unrevealing.  He was examined again and he seems to be stable and doing well for his age.  He has had no recurrence of fever.  His WBC is normal.  Cultures no growth and CXR with no acute findings.  He is stable to DC to SNF today.  Updated DC summary.   Murvin Natal, MD

## 2022-09-24 NOTE — Progress Notes (Signed)
Patient alert with periods of confusion. Patient rested well through the night. No complaints or concerns, no acute overnight events.

## 2022-09-24 NOTE — Plan of Care (Signed)
  Problem: Education: Goal: Knowledge of General Education information will improve Description Including pain rating scale, medication(s)/side effects and non-pharmacologic comfort measures Outcome: Progressing   

## 2022-09-28 ENCOUNTER — Ambulatory Visit: Payer: Medicare Other | Admitting: Orthopaedic Surgery

## 2022-09-28 LAB — CULTURE, BLOOD (ROUTINE X 2)
Culture: NO GROWTH
Culture: NO GROWTH
Special Requests: ADEQUATE
Special Requests: ADEQUATE

## 2022-10-14 ENCOUNTER — Ambulatory Visit: Payer: Medicare Other | Admitting: Internal Medicine

## 2022-11-10 DEATH — deceased

## 2023-01-04 ENCOUNTER — Encounter: Payer: Medicare Other | Admitting: Internal Medicine

## 2023-01-05 ENCOUNTER — Encounter: Payer: Self-pay | Admitting: Internal Medicine
